# Patient Record
Sex: Female | Born: 1975 | Race: White | Hispanic: No | Marital: Married | Smoking: Former smoker
Health system: Southern US, Community
[De-identification: ages and names within clinical notes are randomized; demographics above are authoritative.]

## PROBLEM LIST (undated history)

## (undated) DIAGNOSIS — Z8619 Personal history of other infectious and parasitic diseases: Secondary | ICD-10-CM

## (undated) DIAGNOSIS — M549 Dorsalgia, unspecified: Secondary | ICD-10-CM

## (undated) DIAGNOSIS — M5126 Other intervertebral disc displacement, lumbar region: Secondary | ICD-10-CM

## (undated) DIAGNOSIS — R7303 Prediabetes: Secondary | ICD-10-CM

## (undated) DIAGNOSIS — F319 Bipolar disorder, unspecified: Secondary | ICD-10-CM

## (undated) DIAGNOSIS — M1990 Unspecified osteoarthritis, unspecified site: Secondary | ICD-10-CM

## (undated) DIAGNOSIS — G4730 Sleep apnea, unspecified: Secondary | ICD-10-CM

## (undated) DIAGNOSIS — A0472 Enterocolitis due to Clostridium difficile, not specified as recurrent: Secondary | ICD-10-CM

## (undated) DIAGNOSIS — F429 Obsessive-compulsive disorder, unspecified: Secondary | ICD-10-CM

## (undated) DIAGNOSIS — F419 Anxiety disorder, unspecified: Secondary | ICD-10-CM

## (undated) DIAGNOSIS — K219 Gastro-esophageal reflux disease without esophagitis: Secondary | ICD-10-CM

## (undated) DIAGNOSIS — I10 Essential (primary) hypertension: Secondary | ICD-10-CM

## (undated) DIAGNOSIS — M51369 Other intervertebral disc degeneration, lumbar region without mention of lumbar back pain or lower extremity pain: Secondary | ICD-10-CM

## (undated) DIAGNOSIS — E538 Deficiency of other specified B group vitamins: Secondary | ICD-10-CM

## (undated) DIAGNOSIS — K589 Irritable bowel syndrome without diarrhea: Secondary | ICD-10-CM

## (undated) DIAGNOSIS — F329 Major depressive disorder, single episode, unspecified: Secondary | ICD-10-CM

## (undated) DIAGNOSIS — K5900 Constipation, unspecified: Secondary | ICD-10-CM

## (undated) DIAGNOSIS — N200 Calculus of kidney: Secondary | ICD-10-CM

## (undated) DIAGNOSIS — F32A Depression, unspecified: Secondary | ICD-10-CM

## (undated) DIAGNOSIS — F909 Attention-deficit hyperactivity disorder, unspecified type: Secondary | ICD-10-CM

## (undated) DIAGNOSIS — R0602 Shortness of breath: Secondary | ICD-10-CM

## (undated) DIAGNOSIS — M5136 Other intervertebral disc degeneration, lumbar region: Secondary | ICD-10-CM

## (undated) DIAGNOSIS — M2550 Pain in unspecified joint: Secondary | ICD-10-CM

## (undated) DIAGNOSIS — F4310 Post-traumatic stress disorder, unspecified: Secondary | ICD-10-CM

## (undated) DIAGNOSIS — G43909 Migraine, unspecified, not intractable, without status migrainosus: Secondary | ICD-10-CM

## (undated) DIAGNOSIS — E669 Obesity, unspecified: Secondary | ICD-10-CM

## (undated) HISTORY — DX: Migraine, unspecified, not intractable, without status migrainosus: G43.909

## (undated) HISTORY — DX: Enterocolitis due to Clostridium difficile, not specified as recurrent: A04.72

## (undated) HISTORY — DX: Prediabetes: R73.03

## (undated) HISTORY — DX: Personal history of other infectious and parasitic diseases: Z86.19

## (undated) HISTORY — DX: Unspecified osteoarthritis, unspecified site: M19.90

## (undated) HISTORY — DX: Gastro-esophageal reflux disease without esophagitis: K21.9

## (undated) HISTORY — PX: WISDOM TOOTH EXTRACTION: SHX21

## (undated) HISTORY — DX: Essential (primary) hypertension: I10

## (undated) HISTORY — DX: Dorsalgia, unspecified: M54.9

## (undated) HISTORY — DX: Constipation, unspecified: K59.00

## (undated) HISTORY — DX: Bipolar disorder, unspecified: F31.9

## (undated) HISTORY — DX: Obsessive-compulsive disorder, unspecified: F42.9

## (undated) HISTORY — DX: Attention-deficit hyperactivity disorder, unspecified type: F90.9

## (undated) HISTORY — DX: Sleep apnea, unspecified: G47.30

## (undated) HISTORY — DX: Deficiency of other specified B group vitamins: E53.8

## (undated) HISTORY — DX: Major depressive disorder, single episode, unspecified: F32.9

## (undated) HISTORY — DX: Shortness of breath: R06.02

## (undated) HISTORY — DX: Pain in unspecified joint: M25.50

## (undated) HISTORY — PX: TONSILLECTOMY: SUR1361

## (undated) HISTORY — DX: Post-traumatic stress disorder, unspecified: F43.10

## (undated) HISTORY — DX: Obesity, unspecified: E66.9

## (undated) HISTORY — DX: Irritable bowel syndrome, unspecified: K58.9

## (undated) HISTORY — DX: Calculus of kidney: N20.0

---
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
## 2001-02-26 ENCOUNTER — Other Ambulatory Visit: Admission: RE | Admit: 2001-02-26 | Discharge: 2001-02-26 | Payer: Self-pay | Admitting: *Deleted

## 2010-10-25 ENCOUNTER — Emergency Department (HOSPITAL_COMMUNITY)
Admission: EM | Admit: 2010-10-25 | Discharge: 2010-10-25 | Payer: Self-pay | Source: Home / Self Care | Admitting: Family Medicine

## 2011-11-19 DIAGNOSIS — Z8619 Personal history of other infectious and parasitic diseases: Secondary | ICD-10-CM

## 2011-11-19 HISTORY — DX: Personal history of other infectious and parasitic diseases: Z86.19

## 2015-11-28 LAB — VITAMIN D 25 HYDROXY (VIT D DEFICIENCY, FRACTURES): Vit D, 25-Hydroxy: 29.6

## 2016-03-26 LAB — HEPATIC FUNCTION PANEL
ALT: 22 (ref 7–35)
AST: 22 (ref 13–35)
Alkaline Phosphatase: 59 (ref 25–125)
BILIRUBIN, TOTAL: 0.4

## 2016-03-26 LAB — LIPID PANEL
Cholesterol: 137 (ref 0–200)
HDL: 34 — AB (ref 35–70)
LDL CALC: 86
Triglycerides: 189 — AB (ref 40–160)

## 2016-03-26 LAB — CBC AND DIFFERENTIAL
HCT: 39 (ref 36–46)
Hemoglobin: 12.9 (ref 12.0–16.0)
PLATELETS: 300 (ref 150–399)
WBC: 9.4

## 2016-03-26 LAB — BASIC METABOLIC PANEL
BUN: 12 (ref 4–21)
Creatinine: 0.9 (ref 0.5–1.1)
GLUCOSE: 108
POTASSIUM: 3.9 (ref 3.4–5.3)
Sodium: 136 — AB (ref 137–147)

## 2016-04-23 LAB — CLOSTRIDIUM DIFFICILE TOXIN: C difficile Toxin Gene NAA: POSITIVE

## 2016-04-23 LAB — POCT ERYTHROCYTE SEDIMENTATION RATE, NON-AUTOMATED: SED RATE: 45

## 2016-04-24 DIAGNOSIS — A0472 Enterocolitis due to Clostridium difficile, not specified as recurrent: Secondary | ICD-10-CM

## 2016-04-24 HISTORY — DX: Enterocolitis due to Clostridium difficile, not specified as recurrent: A04.72

## 2017-05-10 ENCOUNTER — Encounter: Payer: Self-pay | Admitting: Emergency Medicine

## 2017-05-10 ENCOUNTER — Ambulatory Visit (HOSPITAL_BASED_OUTPATIENT_CLINIC_OR_DEPARTMENT_OTHER)
Admit: 2017-05-10 | Discharge: 2017-05-10 | Disposition: A | Discharge status: Home / Self Care | Payer: Self-pay | Attending: Emergency Medicine | Admitting: Emergency Medicine

## 2017-05-10 ENCOUNTER — Emergency Department
Admission: EM | Admit: 2017-05-10 | Discharge: 2017-05-10 | Disposition: A | Discharge status: Home / Self Care | Payer: Self-pay | Source: Home / Self Care

## 2017-05-10 DIAGNOSIS — M7989 Other specified soft tissue disorders: Secondary | ICD-10-CM

## 2017-05-10 DIAGNOSIS — I10 Essential (primary) hypertension: Secondary | ICD-10-CM

## 2017-05-10 HISTORY — DX: Other intervertebral disc degeneration, lumbar region without mention of lumbar back pain or lower extremity pain: M51.369

## 2017-05-10 HISTORY — DX: Anxiety disorder, unspecified: F41.9

## 2017-05-10 HISTORY — DX: Depression, unspecified: F32.A

## 2017-05-10 HISTORY — DX: Major depressive disorder, single episode, unspecified: F32.9

## 2017-05-10 HISTORY — DX: Other intervertebral disc displacement, lumbar region: M51.26

## 2017-05-10 HISTORY — DX: Other intervertebral disc degeneration, lumbar region: M51.36

## 2017-05-10 MED ORDER — HYDROCHLOROTHIAZIDE 12.5 MG PO TABS: 12.5000 mg | ORAL_TABLET | Freq: Every day | ORAL | 1 refills | Status: DC

## 2017-05-10 NOTE — Discharge Instructions (Signed)
Return if any problems.

## 2017-05-10 NOTE — ED Triage Notes (Signed)
Pt c/o that her BP is elevated this week, it normally runs 120/80.  Also c/o right calf pain that radiates up into her knee and back down to her foot.  No apparent injury.

## 2017-05-10 NOTE — ED Provider Notes (Signed)
CSN: [id number]     Arrival date & time 05/10/17  1433 History   None    Chief Complaint  Patient presents with  . Leg Pain  . Hypertension   (Consider location/radiation/quality/duration/timing/severity/associated sxs/prior Treatment) The history is provided by the patient. No language interpreter was used.  Leg Pain  Location:  Leg Time since incident:  3 days Leg location:  R leg Pain details:    Quality:  Aching   Radiates to:  Does not radiate   Severity:  Moderate   Onset quality:  Sudden   Timing:  Constant   Progression:  Worsening Chronicity:  New Dislocation: no   Foreign body present:  No foreign bodies Prior injury to area:  No Relieved by:  Nothing Worsened by:  Nothing Ineffective treatments:  None tried Risk factors: no concern for non-accidental trauma   Hypertension   Pt has 2 concerns.  Pt's blood pressure has been high on multiple doctors visits.  Pt is checking at home and is high.  Pt reports increased stress and weight gain.  No regular MD.  Pt also has swelling and pain in her right calf.  Pt reports goes p back of leg.  Pt had recent road trip and is on birth control  Past Medical History:  Diagnosis Date  . Anxiety   . Arthritis   . Bulging lumbar disc   . Depression    Past Surgical History:  Procedure Laterality Date  . CESAREAN SECTION    . TONSILLECTOMY     No family history on file. Social History  Substance Use Topics  . Smoking status: Never Smoker  . Smokeless tobacco: Never Used  . Alcohol use Yes     Comment: 4-5 drinks a week   OB History    No data available     Review of Systems  Musculoskeletal: Positive for myalgias.  All other systems reviewed and are negative.   Allergies  Meloxicam and Penicillins  Home Medications   Prior to Admission medications   Medication Sig Start Date End Date Taking? Authorizing Provider  ALPRAZolam Duanne Moron) 1 MG tablet Take 1 mg by mouth at bedtime as needed for anxiety.   Yes  [provider]  aspirin 81 MG chewable tablet Chew by mouth daily.   Yes [provider]  busPIRone (BUSPAR) 15 MG tablet Take 15 mg by mouth 3 (three) times daily.   Yes [provider]  FLUoxetine (PROZAC) 20 MG tablet Take 20 mg by mouth daily.   Yes [provider]  Norgestim-Eth Estrad Triphasic (TRI-SPRINTEC PO) Take by mouth.   Yes [provider]  hydrochlorothiazide (HYDRODIURIL) 12.5 MG tablet Take 1 tablet (12.5 mg total) by mouth daily. 05/10/17   Fransico Meadow, PA-C   Meds Ordered and Administered this Visit  Medications - No data to display  BP (!) 143/82 (BP Location: Left Arm)   Pulse 75   Temp 99.1 F (37.3 C) (Oral)   Ht 5\' 7"  (1.702 m)   Wt 253 lb 4 oz (114.9 kg)   LMP  (Within Weeks)   SpO2 99%   BMI 39.66 kg/m  No data found.   Physical Exam  Constitutional: She is oriented to person, place, and time. She appears well-developed and well-nourished.  HENT:  Head: Normocephalic.  Eyes: EOM are normal.  Neck: Normal range of motion.  Cardiovascular: Normal rate and regular rhythm.   Pulmonary/Chest: Effort normal and breath sounds normal.  Abdominal: She exhibits no  distension.  Musculoskeletal: Normal range of motion. She exhibits tenderness.  Calf right larger than left,  Slightly positive homans.  Neurological: She is alert and oriented to person, place, and time.  Psychiatric: She has a normal mood and affect.  Nursing note and vitals reviewed.   Urgent Care Course     Procedures (including critical care time)  Labs Review Labs Reviewed - No data to display  Imaging Review No results found.   Visual Acuity Review  Right Eye Distance:   Left Eye Distance:   Bilateral Distance:    Right Eye Near:   Left Eye Near:    Bilateral Near:      Pt to [place] for ultrasound of right leg,  If positive to ED, if negative home to followup with primary care.  referaal given   MDM   1.  Hypertension, unspecified type   2. Right leg swelling    Meds ordered this encounter  Medications  . FLUoxetine (PROZAC) 20 MG tablet    Sig: Take 20 mg by mouth daily.  . busPIRone (BUSPAR) 15 MG tablet    Sig: Take 15 mg by mouth 3 (three) times daily.  Marland Kitchen aspirin 81 MG chewable tablet    Sig: Chew by mouth daily.  Lenard Forth Triphasic (TRI-SPRINTEC PO)    Sig: Take by mouth.  . ALPRAZolam (XANAX) 1 MG tablet    Sig: Take 1 mg by mouth at bedtime as needed for anxiety.  . hydrochlorothiazide (HYDRODIURIL) 12.5 MG tablet    Sig: Take 1 tablet (12.5 mg total) by mouth daily.    Dispense:  30 tablet    Refill:  1    Order Specific Question:   Supervising Provider    AnswerBurnett Harry, DAVID [[id number]]       Fransico Meadow, PA-C 05/10/17 1641

## 2017-05-12 ENCOUNTER — Telehealth: Payer: Self-pay

## 2017-05-12 NOTE — Telephone Encounter (Signed)
Leg still swollen and sore urged to follow up with PCP or UC if symptoms persist.

## 2018-04-08 ENCOUNTER — Encounter: Payer: Self-pay | Admitting: Physician Assistant

## 2018-04-08 ENCOUNTER — Ambulatory Visit: Payer: Self-pay | Admitting: Physician Assistant

## 2018-04-08 VITALS — BP 112/80 | HR 86 | Temp 98.6°F | Ht 66.0 in | Wt 257.2 lb

## 2018-04-08 DIAGNOSIS — F32A Depression, unspecified: Secondary | ICD-10-CM

## 2018-04-08 DIAGNOSIS — G43909 Migraine, unspecified, not intractable, without status migrainosus: Secondary | ICD-10-CM | POA: Insufficient documentation

## 2018-04-08 DIAGNOSIS — F419 Anxiety disorder, unspecified: Secondary | ICD-10-CM

## 2018-04-08 DIAGNOSIS — F4310 Post-traumatic stress disorder, unspecified: Secondary | ICD-10-CM

## 2018-04-08 DIAGNOSIS — F339 Major depressive disorder, recurrent, unspecified: Secondary | ICD-10-CM | POA: Insufficient documentation

## 2018-04-08 DIAGNOSIS — M5136 Other intervertebral disc degeneration, lumbar region: Secondary | ICD-10-CM | POA: Insufficient documentation

## 2018-04-08 DIAGNOSIS — F329 Major depressive disorder, single episode, unspecified: Secondary | ICD-10-CM

## 2018-04-08 DIAGNOSIS — Z7689 Persons encountering health services in other specified circumstances: Secondary | ICD-10-CM

## 2018-04-08 DIAGNOSIS — M1990 Unspecified osteoarthritis, unspecified site: Secondary | ICD-10-CM | POA: Insufficient documentation

## 2018-04-08 DIAGNOSIS — E669 Obesity, unspecified: Secondary | ICD-10-CM

## 2018-04-08 DIAGNOSIS — I10 Essential (primary) hypertension: Secondary | ICD-10-CM | POA: Insufficient documentation

## 2018-04-08 DIAGNOSIS — R635 Abnormal weight gain: Secondary | ICD-10-CM

## 2018-04-08 DIAGNOSIS — R1011 Right upper quadrant pain: Secondary | ICD-10-CM | POA: Insufficient documentation

## 2018-04-08 DIAGNOSIS — K219 Gastro-esophageal reflux disease without esophagitis: Secondary | ICD-10-CM | POA: Insufficient documentation

## 2018-04-08 DIAGNOSIS — M5126 Other intervertebral disc displacement, lumbar region: Secondary | ICD-10-CM | POA: Insufficient documentation

## 2018-04-08 LAB — CBC
HEMATOCRIT: 37.1 % (ref 36.0–46.0)
HEMOGLOBIN: 12.4 g/dL (ref 12.0–15.0)
MCHC: 33.4 g/dL (ref 30.0–36.0)
MCV: 86.6 fl (ref 78.0–100.0)
Platelets: 382 10*3/uL (ref 150.0–400.0)
RBC: 4.28 Mil/uL (ref 3.87–5.11)
RDW: 14.7 % (ref 11.5–15.5)
WBC: 8.8 10*3/uL (ref 4.0–10.5)

## 2018-04-08 LAB — COMPREHENSIVE METABOLIC PANEL
ALBUMIN: 3.5 g/dL (ref 3.5–5.2)
ALT: 25 U/L (ref 0–35)
AST: 18 U/L (ref 0–37)
Alkaline Phosphatase: 63 U/L (ref 39–117)
BILIRUBIN TOTAL: 0.3 mg/dL (ref 0.2–1.2)
BUN: 13 mg/dL (ref 6–23)
CALCIUM: 9.1 mg/dL (ref 8.4–10.5)
CO2: 32 mEq/L (ref 19–32)
CREATININE: 0.96 mg/dL (ref 0.40–1.20)
Chloride: 98 mEq/L (ref 96–112)
GFR: 67.58 mL/min (ref >60.00)
Glucose, Bld: 93 mg/dL (ref 70–99)
Potassium: 4.1 mEq/L (ref 3.5–5.1)
Sodium: 137 mEq/L (ref 135–145)
TOTAL PROTEIN: 6.8 g/dL (ref 6.0–8.3)

## 2018-04-08 LAB — TSH: TSH: 2.53 u[IU]/mL (ref 0.35–4.50)

## 2018-04-08 LAB — HEMOGLOBIN A1C: Hgb A1c MFr Bld: 5.9 % (ref 4.6–6.5)

## 2018-04-08 NOTE — Patient Instructions (Addendum)
It was so great to meet you!  1. Exercise (or get your heart rate up intentionally) for 20-30 min daily (ex - vacuuming, treadmill,walking  Etc.) 2. No sodas for the next 3 weeks 3. Return to office to be re-weighed in 3 weeks  We will contact you with your lab results.  Consider looking into going to the health department for a physical with pap smear (likely reduced cost.)  Please talk to Dr. Creig Hines about the issues we discussed today at your next visit with him.  Return at your convenience to follow-up with [place].

## 2018-04-08 NOTE — Progress Notes (Signed)
Crystal Bean is a 42 y.o. female here to Establish Care.  I acted as a Education administrator for [employer], PA-C Anselmo Pickler, LPN  History of Present Illness:   Chief Complaint  Patient presents with  . Establish Care  . Weight Gain    Acute Concerns: Weight gain -- today she is 257 lb (282 lb was max weight but then got to 205 lb with working out at a gym); has gained about 50 lb since; she struggles with emotional eating.  She states that she has recently restarted drinking sodas, if she has soda at home she will drink it all day.  She has very little motivation to work out.  She does have a treadmill, and a reclining bike at home to use, but she does not use it.  She also enjoys walking but has not been walking recently.  She has not had routine blood work in quite some time, unsure of her thyroid or blood sugars have been elevated in the past.  She does not eat a lot of fast food.  She reports that her husband is a relatively healthy eater, and tries to make healthy choices for everyone in the family.  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  She states that these new issues have not been discussed with Crystal Bean, and she thinks that she needs new, or a change, in medication.  Has not been unable to see [facility] for about the past 6 months due to financial issues. Hypertension -- was diagnosed about a year ago, currently on HCTZ.  She has never been on any other medications for her blood pressure.  She takes her medication every day.  She denies any chest pain, shortness of breath, dizziness, lightheadedness, blurred visions, lower leg swelling.  Health Maintenance: Immunizations -- UTD Colonoscopy -- N/A Mammogram -- overdue PAP -- overdue Weight  -- Weight: 257 lb 4 oz (116.7 kg)   Depression screen PHQ 2/9 04/08/2018  Decreased Interest 3  Down, Depressed, Hopeless 2  PHQ - 2 Score 5  Altered sleeping 2  Tired, decreased energy 3  Change in appetite 3  Feeling bad or failure about yourself  3  Trouble concentrating 3  Moving slowly or fidgety/restless 2  Suicidal thoughts 0  PHQ-9 Score 21  Difficult doing work/chores Extremely dIfficult    GAD 7 : Generalized Anxiety Score 04/08/2018  Nervous, Anxious, on Edge 3  Control/stop worrying 3  Worry too much - different things 3  Trouble relaxing 3  Restless 1  Easily annoyed or irritable 3  Afraid - awful might happen 2  Total GAD 7 Score 18  Anxiety Difficulty Extremely difficult   Other providers/specialists: Crystal Bean -- psychiatrist Crystal Bean -- counselor  Past Medical History:  Diagnosis Date  . Anxiety   . Bulging lumbar disc   . Depression   . GERD (gastroesophageal reflux disease)   . History of chicken pox   . History of shingles 2013  . Hypertension   . Migraines   . Osteoarthritis   . PTSD (post-traumatic stress disorder)      Social History   Socioeconomic History  . Marital status: Married    Spouse name: Not on file  . Number of children: Not on file  .  Years of education: Not on file  . Highest education level: Not on file  Occupational History  . Not on file  Social Needs  . Financial resource strain: Not on file  . Food insecurity:    Worry: Not on file    Inability: Not on file  . Transportation needs:    Medical: Not on file    Non-medical: Not on file  Tobacco Use  . Smoking status: Never Smoker  . Smokeless tobacco: Never Used  Substance and Sexual Activity  . Alcohol use: Yes    Comment: 2-3 drinks a week  . Drug use: No  . Sexual activity: Yes    Birth control/protection: Pill  Lifestyle  . Physical activity:    Days per week: Not on file    Minutes per session: Not on file  . Stress: Not on file   Relationships  . Social connections:    Talks on phone: Not on file    Gets together: Not on file    Attends religious service: Not on file    Active member of club or organization: Not on file    Attends meetings of clubs or organizations: Not on file    Relationship status: Not on file  . Intimate partner violence:    Fear of current or ex partner: Not on file    Emotionally abused: Not on file    Physically abused: Not on file    Forced sexual activity: Not on file  Other Topics Concern  . 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 BLADDER SURGERY  1981   Urethera stretched  . caps on teeth  1980   Caps on all Teeth  . CESAREAN SECTION  2003  . TONSILLECTOMY      Family History  Problem Relation Age of Onset  . Hearing loss Father   . CVA Maternal Grandmother   . Arthritis Paternal Grandmother   . Hearing loss Paternal Grandmother     Allergies  Allergen Reactions  . Meloxicam   . Penicillins      Current Medications:   Current Outpatient Medications:  .  ALPRAZolam (XANAX) 1 MG tablet, Take 1 mg by mouth at bedtime as needed for anxiety., Disp: , Rfl:  .  amphetamine-dextroamphetamine (ADDERALL) 10 MG tablet, , Disp: , Rfl: 0 .  aspirin 81 MG chewable tablet, Chew by mouth daily., Disp: , Rfl:  .  FLUoxetine (PROZAC) 20 MG tablet, Take 20 mg by mouth daily., Disp: , Rfl:  .  hydrochlorothiazide (HYDRODIURIL) 12.5 MG tablet, Take 1 tablet (12.5 mg total) by mouth daily., Disp: 30 tablet, Rfl: 1 .  Norgestim-Eth Estrad Triphasic (TRI-SPRINTEC PO), Take by mouth., Disp: , Rfl:  .  nortriptyline (PAMELOR) 50 MG capsule, Take 50 mg by mouth daily. , Disp:  , Rfl: 3   Review of Systems:   ROS  Negative unless otherwise specified per HPI.  Vitals:   Vitals:   04/08/18 0847  BP: 112/80  Pulse: 86  Temp: 98.6 F (37 C)  TempSrc: Oral  SpO2: 95%  Weight: 257 lb 4 oz (116.7 kg)  Height: 5\' 6"  (1.676 m)     Body mass index is 41.52 kg/m.  Physical Exam:   Physical Exam  Constitutional: She appears well-developed. She is cooperative.  Non-toxic appearance. She does not have a sickly appearance. She does not appear ill. No distress.  Cardiovascular: Normal rate, regular rhythm, S1 normal, S2 normal, normal heart sounds and normal pulses.  No LE edema  Pulmonary/Chest: Effort normal and breath sounds normal.  Neurological: She is alert. GCS eye subscore is 4. GCS verbal subscore is 5. GCS motor subscore is 6.  Skin: Skin is warm, dry and intact.  Psychiatric: She has a normal mood and affect. Her speech is normal and behavior is normal.  Nursing note and vitals reviewed.   Assessment and Plan:    Gwenn was seen today for establish care and weight gain.  Diagnoses and all orders for this visit:  Encounter to establish care Discussed having her return for a physical at her convenience, as she is self-pay.  Weight gain and obesity, unspecified classification, unspecified obesity type, unspecified whether serious comorbidity present Multifactorial.  I do suspect that this is related to her anxiety and depression.  I would like to check a hemoglobin A1c and thyroid as well as a CBC and CMP.  We did make a few calls today, in terms of increasing physical activity to 20 to 30 minutes times a day and cessation of sodas.  She is going to make a nurse visit to follow-up in our office for weight check in about 3 weeks.  Patient verbalized understanding.  She stated that if her hemoglobin A1c was elevated she would be agreeable to starting metformin. -     Hemoglobin A1c -     CBC -     Comprehensive metabolic panel -     TSH  Essential  hypertension Currently well controlled.  Anxiety, depression, PTSD Management per Crystal Bean.  I highly recommended that she try to get back in with [facility] to discuss her mental health.  She does not have any SI/HI today.   . Reviewed expectations re: course of current medical issues. . Discussed self-management of symptoms. . Outlined signs and symptoms indicating need for more acute intervention. . Patient verbalized understanding and all questions were answered. . See orders for this visit as documented in the electronic medical record. . Patient received an After-Visit Summary.   CMA or LPN served as scribe during this visit. History, Physical, and Plan performed by medical provider. Documentation and orders reviewed and attested to.   Inda Coke, PA-C

## 2018-04-10 ENCOUNTER — Other Ambulatory Visit: Payer: Self-pay | Admitting: Physician Assistant

## 2018-04-10 MED ORDER — METFORMIN HCL 500 MG PO TABS
500.0000 mg | ORAL_TABLET | Freq: Every day | ORAL | 0 refills | Status: DC
Start: 2018-04-10 — End: 2018-07-14

## 2018-04-14 ENCOUNTER — Other Ambulatory Visit: Payer: Self-pay | Admitting: *Deleted

## 2018-04-14 MED ORDER — HYDROCHLOROTHIAZIDE 12.5 MG PO TABS: 12.5000 mg | ORAL_TABLET | Freq: Every day | ORAL | 1 refills | Status: DC

## 2018-04-14 MED ORDER — NORGESTIM-ETH ESTRAD TRIPHASIC 0.18/0.215/0.25 MG-35 MCG PO TABS: 1.0000 | ORAL_TABLET | Freq: Every day | ORAL | 0 refills | Status: DC

## 2018-04-14 NOTE — Telephone Encounter (Signed)
Pt notified Rx's were sent to pharmacy.

## 2018-04-14 NOTE — Telephone Encounter (Signed)
Pt requested refills on Birth control and HCTZ. Discussed with Aldona Bar okay to refill.

## 2018-04-29 ENCOUNTER — Ambulatory Visit (INDEPENDENT_AMBULATORY_CARE_PROVIDER_SITE_OTHER): Payer: Self-pay

## 2018-04-29 VITALS — Wt 245.0 lb

## 2018-04-29 DIAGNOSIS — R635 Abnormal weight gain: Secondary | ICD-10-CM

## 2018-04-29 DIAGNOSIS — E669 Obesity, unspecified: Secondary | ICD-10-CM

## 2018-04-29 NOTE — Progress Notes (Signed)
Patient in office today for nurse visit weight check. Her current weight is 245. She will make another appointment for weight check in 3 to 4 weeks. She will continue all the hard work.

## 2018-05-22 ENCOUNTER — Ambulatory Visit (INDEPENDENT_AMBULATORY_CARE_PROVIDER_SITE_OTHER): Payer: Self-pay

## 2018-05-22 ENCOUNTER — Ambulatory Visit (INDEPENDENT_AMBULATORY_CARE_PROVIDER_SITE_OTHER): Payer: Self-pay | Admitting: Physician Assistant

## 2018-05-22 ENCOUNTER — Encounter: Payer: Self-pay | Admitting: Physician Assistant

## 2018-05-22 VITALS — BP 102/70 | HR 71 | Temp 98.7°F | Ht 66.0 in | Wt 241.5 lb

## 2018-05-22 DIAGNOSIS — R1084 Generalized abdominal pain: Secondary | ICD-10-CM

## 2018-05-22 LAB — CBC
HEMATOCRIT: 39.9 % (ref 36.0–46.0)
HEMOGLOBIN: 13.3 g/dL (ref 12.0–15.0)
MCHC: 33.3 g/dL (ref 30.0–36.0)
MCV: 86.5 fl (ref 78.0–100.0)
Platelets: 384 10*3/uL (ref 150.0–400.0)
RBC: 4.61 Mil/uL (ref 3.87–5.11)
RDW: 15.5 % (ref 11.5–15.5)
WBC: 7.8 10*3/uL (ref 4.0–10.5)

## 2018-05-22 LAB — COMPREHENSIVE METABOLIC PANEL
ALBUMIN: 4.1 g/dL (ref 3.5–5.2)
ALK PHOS: 58 U/L (ref 39–117)
ALT: 44 U/L — ABNORMAL HIGH (ref 0–35)
AST: 33 U/L (ref 0–37)
BUN: 10 mg/dL (ref 6–23)
CALCIUM: 9.4 mg/dL (ref 8.4–10.5)
CHLORIDE: 95 meq/L — AB (ref 96–112)
CO2: 29 mEq/L (ref 19–32)
Creatinine, Ser: 1.01 mg/dL (ref 0.40–1.20)
GFR: 63.7 mL/min (ref >60.00)
Glucose, Bld: 108 mg/dL — ABNORMAL HIGH (ref 70–99)
POTASSIUM: 3.5 meq/L (ref 3.5–5.1)
Sodium: 136 mEq/L (ref 135–145)
TOTAL PROTEIN: 7.7 g/dL (ref 6.0–8.3)
Total Bilirubin: 0.4 mg/dL (ref 0.2–1.2)

## 2018-05-22 LAB — POCT URINALYSIS DIPSTICK
Bilirubin, UA: NEGATIVE
Glucose, UA: NEGATIVE
Ketones, UA: NEGATIVE
LEUKOCYTES UA: NEGATIVE
NITRITE UA: NEGATIVE
PH UA: 6 (ref 5.0–8.0)
PROTEIN UA: NEGATIVE
SPEC GRAV UA: 1.015 (ref 1.010–1.025)
Urobilinogen, UA: 0.2 E.U./dL

## 2018-05-22 LAB — POCT URINE PREGNANCY: Preg Test, Ur: NEGATIVE

## 2018-05-22 LAB — LIPASE: LIPASE: 30 U/L (ref 11.0–59.0)

## 2018-05-22 NOTE — Progress Notes (Signed)
Crystal Bean is a 42 y.o. female here for a new problem.   History of Present Illness:   Chief Complaint  Patient presents with  . Abdominal Pain    Abdominal Pain  This is a new problem. Episode onset: Started 3 weeeks ago, pt says she is unable to eat only eating 500- 1000 calories a day. The onset quality is gradual. The problem occurs constantly. The problem has been gradually worsening. The pain is located in the generalized abdominal region. Pain scale: right now no pain as the day goes on she does get pain. The quality of the pain is cramping and aching. The abdominal pain radiates to the epigastric region (Rib area). Associated symptoms include belching, constipation, diarrhea, nausea and vomiting. Pertinent negatives include no fever or headaches. The pain is relieved by being still. She has tried antacids for the symptoms. The treatment provided mild relief. There is no history of colon cancer, Crohn's disease, gallstones, GERD, irritable bowel syndrome, pancreatitis or ulcerative colitis.    Wt Readings from Last 3 Encounters:  05/22/18 241 lb 8 oz (109.5 kg)  04/29/18 245 lb (111.1 kg)  04/08/18 257 lb 4 oz (116.7 kg)   Taking Metformin as written -- she states that when she first started medication she did not have any issues with GI tolerance. Has intermittent constipation and diarrhea. Denies any changes in urination -- having stress incontinence which is relatively new for her.  No fevers. Takes her birth control religiously -- Patient's last menstrual period was 04/29/2018.  Husband present during interview and exam.   Past Medical History:  Diagnosis Date  . Anxiety   . Bulging lumbar disc   . Depression   . GERD (gastroesophageal reflux disease)   . History of chicken pox   . History of shingles 2013  . Hypertension   . Migraines   . Osteoarthritis   . PTSD (post-traumatic stress disorder)      Social History   Socioeconomic History  . Marital status:  Married    Spouse name: Not on file  . Number of children: Not on file  . Years of education: Not on file  . Highest education level: Not on file  Occupational History  . Not on file  Social Needs  . Financial resource strain: Not on file  . Food insecurity:    Worry: Not on file    Inability: Not on file  . Transportation needs:    Medical: Not on file    Non-medical: Not on file  Tobacco Use  . Smoking status: Never Smoker  . Smokeless tobacco: Never Used  Substance and Sexual Activity  . Alcohol use: Yes    Comment: 2-3 drinks a week  . Drug use: No  . Sexual activity: Yes    Birth control/protection: Pill  Lifestyle  . Physical activity:    Days per week: Not on file    Minutes per session: Not on file  . Stress: Not on file  Relationships  . Social connections:    Talks on phone: Not on file    Gets together: Not on file    Attends religious service: Not on file    Active member of club or organization: Not on file    Attends meetings of clubs or organizations: Not on file    Relationship status: Not on file  . Intimate partner violence:    Fear of current or ex partner: Not on file    Emotionally abused: Not  on file    Physically abused: Not on file    Forced sexual activity: Not on file  Other Topics Concern  . 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 BLADDER SURGERY  1981   Urethera stretched  . caps on teeth  1980   Caps on all Teeth  . CESAREAN SECTION  2003  . TONSILLECTOMY      Family History  Problem Relation Age of Onset  . Hearing loss Father   . CVA Maternal Grandmother   . Arthritis Paternal Grandmother   . Hearing loss Paternal  Grandmother     Allergies  Allergen Reactions  . Meloxicam   . Penicillins     Current Medications:   Current Outpatient Medications:  .  ALPRAZolam (XANAX) 1 MG tablet, Take 1 mg by mouth 3 (three) times daily as needed for anxiety. , Disp: , Rfl:  .  amphetamine-dextroamphetamine (ADDERALL) 10 MG tablet, as needed. , Disp: , Rfl: 0 .  aspirin 81 MG chewable tablet, Chew by mouth daily., Disp: , Rfl:  .  desvenlafaxine (PRISTIQ) 50 MG 24 hr tablet, TAKE 1 TABLET BY MOUTH IN THE MORNING AND TAKE 2 TABLETS AT BEDTIME, Disp: , Rfl: 2 .  hydrochlorothiazide (HYDRODIURIL) 12.5 MG tablet, Take 1 tablet (12.5 mg total) by mouth daily., Disp: 30 tablet, Rfl: 1 .  metFORMIN (GLUCOPHAGE) 500 MG tablet, Take 1 tablet (500 mg total) by mouth daily with breakfast., Disp: 90 tablet, Rfl: 0 .  Norgestimate-Ethinyl Estradiol Triphasic (TRI-SPRINTEC) 0.18/0.215/0.25 MG-35 MCG tablet, Take 1 tablet by mouth daily., Disp: 3 Package, Rfl: 0   Review of Systems:   Review of Systems  Constitutional: Negative for fever.  Gastrointestinal: Positive for abdominal pain, constipation, diarrhea, nausea and vomiting.  Neurological: Negative for headaches.    Vitals:   Vitals:   05/22/18 0821  BP: 102/70  Pulse: 71  Temp: 98.7 F (37.1 C)  TempSrc: Oral  SpO2: 96%  Weight: 241 lb 8 oz (109.5 kg)  Height: 5\' 6"  (1.676 m)     Body mass index is 38.98 kg/m.  Physical Exam:   Physical Exam  Constitutional: She appears well-developed. She is cooperative.  Non-toxic appearance. She does not have a sickly appearance. She does not appear ill. No distress.  Cardiovascular: Normal rate, regular rhythm, S1 normal, S2 normal, normal heart sounds and normal pulses.  No LE edema  Pulmonary/Chest: Effort normal and breath sounds normal.  Abdominal: Normal appearance and bowel sounds are normal. There is generalized tenderness. There is no rigidity, no rebound, no guarding, no CVA tenderness, no tenderness at  McBurney's point and negative Murphy's sign.  Neurological: She is alert. GCS eye subscore is 4. GCS verbal subscore is 5. GCS motor subscore is 6.  Skin: Skin is warm, dry and intact.  Psychiatric: She has a normal mood and affect. Her speech is normal and behavior is normal.  Nursing note and vitals reviewed.   Results for orders placed or performed in visit on 05/22/18  POCT urine pregnancy  Result Value Ref Range   Preg Test, Ur Negative Negative  POCT urinalysis dipstick  Result Value Ref Range   Color, UA Yellow    Clarity, UA Clear    Glucose, UA Negative Negative   Bilirubin, UA Negative    Ketones, UA Negative    Spec Grav, UA 1.015 1.010 - 1.025   Blood, UA Moderate    pH, UA 6.0 5.0 - 8.0   Protein, UA Negative Negative   Urobilinogen, UA 0.2 0.2 or 1.0 E.U./dL   Nitrite, UA Negative    Leukocytes, UA Negative Negative   Appearance     Odor      Abd xray -- shows stool burden on R side  Assessment and Plan:    Kaslyn was seen today for abdominal pain.  Diagnoses and all orders for this visit:  Generalized abdominal pain Abdominal exam benign, vitals normal. Abdominal xray with stool burden. Discussed Miralax regimen to help with symptoms. Will check labs as well. If symptoms worsen or do not improve, will obtain abd u/s. Patient and husband is agreeable to plan. Provided list of red flags. -     POCT urine pregnancy -     Comprehensive metabolic panel -     CBC -     DG Abd 2 Views; Future -     POCT urinalysis dipstick -     Lipase   . Reviewed expectations re: course of current medical issues. . Discussed self-management of symptoms. . Outlined signs and symptoms indicating need for more acute intervention. . Patient verbalized understanding and all questions were answered. . See orders for this visit as documented in the electronic medical record. . Patient received an After-Visit Summary.  CMA or LPN served as scribe during this visit. History,  Physical, and Plan performed by medical provider. Documentation and orders reviewed and attested to.   Inda Coke, PA-C

## 2018-05-22 NOTE — Patient Instructions (Addendum)
Hold Metformin for at least a week while we work on having regular bowel movements.  Start 1 capful of Miralax daily, may increase by half capfuls daily until you reach your goal of having 1 soft, formed bowel movement daily.  Keep [place] posted on symptoms. We can order abdominal ultrasound for further evaluation if needed.   Abdominal Pain, Adult Many things can cause belly (abdominal) pain. Most times, belly pain is not dangerous. Many cases of belly pain can be watched and treated at home. Sometimes belly pain is serious, though. Your doctor will try to find the cause of your belly pain. Follow these instructions at home:  Take over-the-counter and prescription medicines only as told by your doctor. Do not take medicines that help you poop (laxatives) unless told to by your doctor.  Drink enough fluid to keep your pee (urine) clear or pale yellow.  Watch your belly pain for any changes.  Keep all follow-up visits as told by your doctor. This is important. Contact a doctor if:  Your belly pain changes or gets worse.  You are not hungry, or you lose weight without trying.  You are having trouble pooping (constipated) or have watery poop (diarrhea) for more than 2-3 days.  You have pain when you pee or poop.  Your belly pain wakes you up at night.  Your pain gets worse with meals, after eating, or with certain foods.  You are throwing up and cannot keep anything down.  You have a fever. Get help right away if:  Your pain does not go away as soon as your doctor says it should.  You cannot stop throwing up.  Your pain is only in areas of your belly, such as the right side or the left lower part of the belly.  You have bloody or black poop, or poop that looks like tar.  You have very bad pain, cramping, or bloating in your belly.  You have signs of not having enough fluid or water in your body (dehydration), such as: ? Dark pee, very little pee, or no pee. ? Cracked  lips. ? Dry mouth. ? Sunken eyes. ? Sleepiness. ? Weakness. This information is not intended to replace advice given to you by your health care provider. Make sure you discuss any questions you have with your health care provider. Document Released: 04/22/2008 Document Revised: 05/24/2016 Document Reviewed: 04/17/2016 Elsevier Interactive Patient Education  2018 [employer].

## 2018-05-26 ENCOUNTER — Ambulatory Visit: Payer: Self-pay

## 2018-05-29 ENCOUNTER — Encounter: Payer: Self-pay | Admitting: Physician Assistant

## 2018-07-06 ENCOUNTER — Other Ambulatory Visit: Payer: Self-pay | Admitting: Physician Assistant

## 2018-07-14 ENCOUNTER — Encounter: Payer: Self-pay | Admitting: Physician Assistant

## 2018-07-14 ENCOUNTER — Ambulatory Visit (HOSPITAL_COMMUNITY)
Admission: RE | Admit: 2018-07-14 | Discharge: 2018-07-14 | Disposition: A | Discharge status: Home / Self Care | Payer: Self-pay | Attending: Psychiatry | Admitting: Psychiatry

## 2018-07-14 ENCOUNTER — Ambulatory Visit: Payer: Self-pay | Admitting: Physician Assistant

## 2018-07-14 VITALS — BP 120/76 | HR 72 | Temp 98.6°F | Ht 66.0 in | Wt 249.8 lb

## 2018-07-14 DIAGNOSIS — K219 Gastro-esophageal reflux disease without esophagitis: Secondary | ICD-10-CM | POA: Insufficient documentation

## 2018-07-14 DIAGNOSIS — Z822 Family history of deafness and hearing loss: Secondary | ICD-10-CM | POA: Insufficient documentation

## 2018-07-14 DIAGNOSIS — F4310 Post-traumatic stress disorder, unspecified: Secondary | ICD-10-CM

## 2018-07-14 DIAGNOSIS — M1990 Unspecified osteoarthritis, unspecified site: Secondary | ICD-10-CM | POA: Insufficient documentation

## 2018-07-14 DIAGNOSIS — I10 Essential (primary) hypertension: Secondary | ICD-10-CM | POA: Insufficient documentation

## 2018-07-14 DIAGNOSIS — F322 Major depressive disorder, single episode, severe without psychotic features: Secondary | ICD-10-CM | POA: Insufficient documentation

## 2018-07-14 DIAGNOSIS — F419 Anxiety disorder, unspecified: Secondary | ICD-10-CM | POA: Insufficient documentation

## 2018-07-14 DIAGNOSIS — Z880 Allergy status to penicillin: Secondary | ICD-10-CM | POA: Insufficient documentation

## 2018-07-14 DIAGNOSIS — Z886 Allergy status to analgesic agent status: Secondary | ICD-10-CM | POA: Insufficient documentation

## 2018-07-14 DIAGNOSIS — F332 Major depressive disorder, recurrent severe without psychotic features: Secondary | ICD-10-CM
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
## 2018-07-14 NOTE — Patient Instructions (Addendum)
Please go to [facility].  [address] [place], [place] [address]

## 2018-07-14 NOTE — H&P (Signed)
[place] Screening Exam  Crystal Bean is an 42 y.o. female patient presents to [facility] as walk in; brought in by her husband after patient's MD visit today and filling out depression questionnaire; and provider instructing her to go to hospital for evaluation.  Patient states that she is depressed; that she has a psychiatrist.  Patient denies suicidal/self-harm/homicidal ideation, psychosis, and paranoia.  Husband at side and states that he also feels safe with patient being home and doesn't feel that patient is suicidal.  Total Time spent with patient: 45 minutes  Psychiatric Specialty Exam: Physical Exam  Constitutional: She is oriented to person, place, and time. She appears well-nourished.  Neck: Normal range of motion. Neck supple.  Respiratory: Effort normal.  Musculoskeletal: Normal range of motion.  Neurological: She is alert and oriented to person, place, and time.  Skin: Skin is warm and dry.  Psychiatric: Her speech is normal and behavior is normal. Judgment and thought content normal. Her mood appears anxious (Stable). Cognition and memory are normal. She exhibits a depressed mood (Stable).    Review of Systems  Psychiatric/Behavioral: Negative for substance abuse. Depression: Stable. Hallucinations: Denies. Suicidal ideas: Denies. Nervous/anxious: Stable.   All other systems reviewed and are negative.   Blood pressure 133/86, pulse 70, temperature 98.4 F (36.9 C), resp. rate 16, last menstrual period 06/30/2018, SpO2 100 %.There is no height or weight on file to calculate BMI.  General Appearance: Casual and Neat  Eye Contact:  Good  Speech:  Clear and Coherent and Normal Rate  Volume:  Normal  Mood:  Appropriate  Affect:  Appropriate  Thought Process:  Coherent and Goal Directed  Orientation:  Full (Time, Place, and Person)  Thought Content:  Logical  Suicidal Thoughts:  No  Homicidal Thoughts:  No  Memory:  Immediate;   Good Recent;   Good Remote;    Good  Judgement:  Intact  Insight:  Present  Psychomotor Activity:  Normal  Concentration: Concentration: Good and Attention Span: Good  Recall:  Good  Fund of Knowledge:Good  Language: Good  Akathisia:  No  Handed:  Right  AIMS (if indicated):     Assets:  Communication Skills Desire for Improvement Financial Resources/Insurance Housing Physical Health Social Support Transportation  Sleep:       Musculoskeletal: Strength & Muscle Tone: within normal limits Gait & Station: normal Patient leans: N/A  Blood pressure 133/86, pulse 70, temperature 98.4 F (36.9 C), resp. rate 16, last menstrual period 06/30/2018, SpO2 100 %.  Recommendations:  Follow up with current outpatient psychiatric provider (Dr. Creig Hines); give resources for community psychiatric services  Disposition:  Patient psychiatrically cleared No evidence of imminent risk to self or others at present.   Patient does not meet criteria for psychiatric inpatient admission. Supportive therapy provided about ongoing stressors. Discussed crisis plan, support from social network, calling 911, coming to the Emergency Department, and calling Suicide Hotline.  Based on my evaluation the patient does not appear to have an emergency medical condition.   Shuvon Rankin, NP 07/14/2018, 11:35 AM

## 2018-07-19 ENCOUNTER — Other Ambulatory Visit: Payer: Self-pay | Admitting: Physician Assistant

## 2018-07-19 ENCOUNTER — Encounter: Payer: Self-pay | Admitting: Physician Assistant

## 2018-08-26 ENCOUNTER — Ambulatory Visit (INDEPENDENT_AMBULATORY_CARE_PROVIDER_SITE_OTHER): Payer: Self-pay | Admitting: Physician Assistant

## 2018-08-26 ENCOUNTER — Ambulatory Visit: Payer: Self-pay

## 2018-08-26 ENCOUNTER — Encounter: Payer: Self-pay | Admitting: Physician Assistant

## 2018-08-26 VITALS — BP 146/100 | HR 90 | Temp 98.5°F | Ht 66.0 in | Wt 252.2 lb

## 2018-08-26 DIAGNOSIS — R413 Other amnesia: Secondary | ICD-10-CM

## 2018-08-26 DIAGNOSIS — I10 Essential (primary) hypertension: Secondary | ICD-10-CM

## 2018-08-26 DIAGNOSIS — F319 Bipolar disorder, unspecified: Secondary | ICD-10-CM

## 2018-08-26 LAB — COMPREHENSIVE METABOLIC PANEL
ALT: 34 U/L (ref 0–35)
AST: 33 U/L (ref 0–37)
Albumin: 3.9 g/dL (ref 3.5–5.2)
Alkaline Phosphatase: 63 U/L (ref 39–117)
BILIRUBIN TOTAL: 0.3 mg/dL (ref 0.2–1.2)
BUN: 9 mg/dL (ref 6–23)
CHLORIDE: 96 meq/L (ref 96–112)
CO2: 32 mEq/L (ref 19–32)
Calcium: 9.5 mg/dL (ref 8.4–10.5)
Creatinine, Ser: 0.95 mg/dL (ref 0.40–1.20)
GFR: 68.28 mL/min (ref >60.00)
GLUCOSE: 109 mg/dL — AB (ref 70–99)
Potassium: 3.4 mEq/L — ABNORMAL LOW (ref 3.5–5.1)
Sodium: 137 mEq/L (ref 135–145)
Total Protein: 7.8 g/dL (ref 6.0–8.3)

## 2018-08-26 LAB — CBC
HCT: 39.6 % (ref 36.0–46.0)
Hemoglobin: 12.9 g/dL (ref 12.0–15.0)
MCHC: 32.7 g/dL (ref 30.0–36.0)
MCV: 85.1 fl (ref 78.0–100.0)
Platelets: 411 10*3/uL — ABNORMAL HIGH (ref 150.0–400.0)
RBC: 4.65 Mil/uL (ref 3.87–5.11)
RDW: 15.5 % (ref 11.5–15.5)
WBC: 9.6 10*3/uL (ref 4.0–10.5)

## 2018-08-26 LAB — VITAMIN B12: VITAMIN B 12: 276 pg/mL (ref 211–911)

## 2018-08-26 LAB — TSH: TSH: 2.89 u[IU]/mL (ref 0.35–4.50)

## 2018-08-26 MED ORDER — LOSARTAN POTASSIUM-HCTZ 50-12.5 MG PO TABS: 1.0000 | ORAL_TABLET | Freq: Every day | ORAL | 1 refills | Status: DC

## 2018-08-26 NOTE — Patient Instructions (Addendum)
It was great to see you!  Stop HCTZ. Start Hyzaar -- Hydrochlorothiazide and Losartan combo pill  Follow-up in 2 weeks, sooner if you develop changes in symptoms.  Please call [facility] and tell them that I have put in an order for the MRI without contrast. Tell them that you are self pay and do not require a prior authorization. They should help you schedule this.
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
## 2018-08-27 ENCOUNTER — Ambulatory Visit
Admission: RE | Admit: 2018-08-27 | Discharge: 2018-08-27 | Disposition: A | Discharge status: Home / Self Care | Payer: Self-pay | Source: Ambulatory Visit | Attending: Physician Assistant | Admitting: Physician Assistant

## 2018-08-27 DIAGNOSIS — R413 Other amnesia: Secondary | ICD-10-CM

## 2018-08-31 ENCOUNTER — Other Ambulatory Visit: Payer: Self-pay | Admitting: Physician Assistant

## 2018-08-31 DIAGNOSIS — R413 Other amnesia: Secondary | ICD-10-CM

## 2018-08-31 MED ORDER — CYANOCOBALAMIN 1000 MCG/ML IJ SOLN: 1000.0000 ug | INTRAMUSCULAR | 3 refills | Status: DC

## 2018-09-01 ENCOUNTER — Telehealth: Payer: Self-pay

## 2018-09-01 NOTE — Telephone Encounter (Signed)
Called and spoke with patient.  I explained that Butch Penny is out today, pt prefers to wait to speak w/Donna when she returns in reference to b12 injections.  Pt will call back again tomorrow.

## 2018-09-01 NOTE — Telephone Encounter (Signed)
Copied from [place] [id number]. Topic: General - Other >> Sep 01, 2018 12:46 PM Conception Chancy, NT wrote: Reason for CRM: patient is requesting a call back from [facility].

## 2018-09-08 ENCOUNTER — Ambulatory Visit: Payer: Self-pay | Admitting: Psychiatry

## 2018-09-08 ENCOUNTER — Encounter: Payer: Self-pay | Admitting: Psychiatry

## 2018-09-08 VITALS — BP 132/84 | HR 76 | Ht 67.0 in | Wt 260.0 lb

## 2018-09-08 DIAGNOSIS — F429 Obsessive-compulsive disorder, unspecified: Secondary | ICD-10-CM | POA: Insufficient documentation

## 2018-09-08 DIAGNOSIS — F422 Mixed obsessional thoughts and acts: Secondary | ICD-10-CM

## 2018-09-08 DIAGNOSIS — F902 Attention-deficit hyperactivity disorder, combined type: Secondary | ICD-10-CM

## 2018-09-08 DIAGNOSIS — F339 Major depressive disorder, recurrent, unspecified: Secondary | ICD-10-CM

## 2018-09-08 DIAGNOSIS — F4310 Post-traumatic stress disorder, unspecified: Secondary | ICD-10-CM

## 2018-09-08 MED ORDER — AMPHETAMINE-DEXTROAMPHETAMINE 10 MG PO TABS: 10.0000 mg | ORAL_TABLET | Freq: Two times a day (BID) | ORAL | 0 refills | Status: DC
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
## 2018-09-14 ENCOUNTER — Encounter: Payer: Self-pay | Admitting: Physician Assistant

## 2018-09-17 ENCOUNTER — Ambulatory Visit: Payer: Self-pay | Admitting: Physician Assistant

## 2018-09-17 ENCOUNTER — Encounter: Payer: Self-pay | Admitting: Physician Assistant

## 2018-09-17 VITALS — BP 118/78 | HR 88 | Temp 98.7°F | Resp 16 | Ht 66.0 in | Wt 256.8 lb

## 2018-09-17 DIAGNOSIS — I10 Essential (primary) hypertension: Secondary | ICD-10-CM

## 2018-09-17 DIAGNOSIS — M25512 Pain in left shoulder: Secondary | ICD-10-CM

## 2018-09-17 LAB — BASIC METABOLIC PANEL
BUN: 11 mg/dL (ref 6–23)
CHLORIDE: 100 meq/L (ref 96–112)
CO2: 29 meq/L (ref 19–32)
CREATININE: 0.93 mg/dL (ref 0.40–1.20)
Calcium: 9.4 mg/dL (ref 8.4–10.5)
GFR: 69.96 mL/min (ref >60.00)
GLUCOSE: 96 mg/dL (ref 70–99)
Potassium: 3.9 mEq/L (ref 3.5–5.1)
Sodium: 138 mEq/L (ref 135–145)

## 2018-09-17 MED ORDER — LOSARTAN POTASSIUM-HCTZ 50-12.5 MG PO TABS: 1.0000 | ORAL_TABLET | Freq: Every day | ORAL | 1 refills | Status: DC
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
## 2018-09-17 NOTE — Patient Instructions (Signed)
It was great to see you!  Continue blood pressure medication.  Try the neck exercises.  Let's follow-up in 6 months, sooner if you have concerns.  Take care,  Inda Coke PA-C

## 2018-10-28 ENCOUNTER — Other Ambulatory Visit: Payer: Self-pay | Admitting: *Deleted

## 2018-10-28 MED ORDER — HYDROCHLOROTHIAZIDE 12.5 MG PO CAPS: 12.5000 mg | ORAL_CAPSULE | Freq: Every day | ORAL | 0 refills | Status: DC

## 2018-10-28 MED ORDER — LOSARTAN POTASSIUM 50 MG PO TABS: 50.0000 mg | ORAL_TABLET | Freq: Every day | ORAL | 0 refills | Status: DC

## 2018-10-29 ENCOUNTER — Encounter: Payer: Self-pay | Admitting: *Deleted

## 2018-10-29 ENCOUNTER — Telehealth: Payer: Self-pay | Admitting: *Deleted

## 2018-10-29 NOTE — Telephone Encounter (Signed)
Called patient to advise her this office will delay opening tomorrow until 9 am due to ewear prediction. This RN asked if she could come in for a 9:30 appointment. She stated she can; advised she arrive early to check in. She verbalized understanding.

## 2018-10-30 ENCOUNTER — Ambulatory Visit: Payer: Self-pay | Admitting: Diagnostic Neuroimaging

## 2018-10-30 ENCOUNTER — Encounter: Payer: Self-pay | Admitting: Diagnostic Neuroimaging

## 2018-10-30 VITALS — BP 134/76 | HR 78 | Ht 66.0 in | Wt 274.0 lb

## 2018-10-30 DIAGNOSIS — R200 Anesthesia of skin: Secondary | ICD-10-CM

## 2018-10-30 DIAGNOSIS — R413 Other amnesia: Secondary | ICD-10-CM

## 2018-10-30 DIAGNOSIS — H538 Other visual disturbances: Secondary | ICD-10-CM

## 2018-10-30 NOTE — Progress Notes (Signed)
[facility]  PATIENT: Crystal Bean DOB: 1976-02-03  REFERRING CLINICIAN: Gwynneth Munson HISTORY FROM: patient  REASON FOR VISIT: new consult    HISTORICAL  CHIEF COMPLAINT:  Chief Complaint  Patient presents with  . New Patient (Initial Visit)    Rm 7, David-husband  . Referred by Inda Coke PA,    Memory loss    HISTORY OF PRESENT ILLNESS:   42 year old female with history of ADHD, bipolar disorder, PTSD here for evaluation of memory loss.  At age 80 years old patient was having increasing difficulty at school, hyperactivity, was diagnosed with ADHD and anxiety and depression.  She was prescribed Prozac and Ritalin.  Patient was able to complete high school.  She went to college and failed her first year.  She had to repeat her classes and graduated with a bachelor's in marketing in 5 years.  She worked for several years afterwards.  In 2006 she got married.  She continued to have issues with focus and attention.  In the past 6 to 12 months patient is having increasing word finding difficulties, blurred vision, numbness, memory loss, difficulty concentrating, slurred speech.  Patient has been seeing psychiatrist and psychologist.  She has recently been diagnosed with bipolar disorder rather than depression and anxiety.  She has been on mirtazapine and Abilify for the past few months.  Memory loss has slightly improved since she has started Abilify.  Patient also has diffuse pain, back pain, dropping things, hands going numb.  Patient had MRI of the brain by PCP which showed nonspecific T2 hyperintensities.   REVIEW OF SYSTEMS: Full 14 system review of systems performed and negative with exception of: Snoring sleepiness excessive fatigue weight gain blurred vision joint pain shortness of breath feeling hot feeling cold flushing easy bruising.  Depression anxiety racing thoughts.  ALLERGIES: Allergies  Allergen Reactions  . Meloxicam   . Penicillins      HOME MEDICATIONS: Outpatient Medications Prior to Visit  Medication Sig Dispense Refill  . ALPRAZolam (XANAX) 1 MG tablet Take 1 mg by mouth 3 (three) times daily as needed for anxiety.     Derrill Memo ON 11/07/2018] amphetamine-dextroamphetamine (ADDERALL) 10 MG tablet Take 1 tablet (10 mg total) by mouth 2 (two) times daily with a meal. 60 tablet 0  . ARIPiprazole (ABILIFY) 10 MG tablet Take 10 mg by mouth daily.    Marland Kitchen aspirin 81 MG chewable tablet Chew by mouth daily.    . cyanocobalamin (,VITAMIN B-12,) 1000 MCG/ML injection Inject 1 mL (1,000 mcg total) into the muscle every 30 (thirty) days. 3 mL 3  . hydrochlorothiazide (MICROZIDE) 12.5 MG capsule Take 1 capsule (12.5 mg total) by mouth daily. 90 capsule 0  . losartan (COZAAR) 50 MG tablet Take 1 tablet (50 mg total) by mouth daily. 90 tablet 0  . mirtazapine (REMERON) 45 MG tablet Take 45 mg by mouth at bedtime.  0  . TRI-SPRINTEC 0.18/0.215/0.25 MG-35 MCG tablet TAKE 1 TABLET BY MOUTH ONCE DAILY 84 tablet 0  . amphetamine-dextroamphetamine (ADDERALL) 10 MG tablet Take 1 tablet (10 mg total) by mouth 2 (two) times daily with a meal. 60 tablet 0  . amphetamine-dextroamphetamine (ADDERALL) 10 MG tablet Take 1 tablet (10 mg total) by mouth 2 (two) times daily with a meal. 60 tablet 0   No facility-administered medications prior to visit.     PAST MEDICAL HISTORY: Past Medical History:  Diagnosis Date  . Anxiety   . B12 deficiency   . Bipolar  1 disorder ([place])   . Bulging lumbar disc   . C. difficile diarrhea 04/24/2016   treated with metronidazole  . Depression   . Depression   . GERD (gastroesophageal reflux disease)   . History of chicken pox   . History of shingles 2013  . Hypertension   . Migraines   . Osteoarthritis   . PTSD (post-traumatic stress disorder)     PAST SURGICAL HISTORY: Past Surgical History:  Procedure Laterality Date  . BLADDER SURGERY  1981   Urethera stretched  . caps on teeth  1980   Caps on  all Teeth  . CESAREAN SECTION  2003  . TONSILLECTOMY      FAMILY HISTORY: Family History  Problem Relation Age of Onset  . Hearing loss Father   . CVA Maternal Grandmother   . Arthritis Paternal Grandmother   . Hearing loss Paternal Grandmother     SOCIAL HISTORY: Social History   Socioeconomic History  . Marital status: Married    Spouse name: Not on file  . Number of children: Not on file  . Years of education: Not on file  . Highest education level: Not on file  Occupational History  . Not on file  Social Needs  . Financial resource strain: Not on file  . Food insecurity:    Worry: Not on file    Inability: Not on file  . Transportation needs:    Medical: Not on file    Non-medical: Not on file  Tobacco Use  . Smoking status: Former Smoker    Last attempt to quit: 06/19/2011    Years since quitting: 7.3  . Smokeless tobacco: Never Used  Substance and Sexual Activity  . Alcohol use: Yes    Comment: rare  . Drug use: No  . Sexual activity: Yes    Birth control/protection: Pill  Lifestyle  . Physical activity:    Days per week: Not on file    Minutes per session: Not on file  . Stress: Not on file  Relationships  . Social connections:    Talks on phone: Not on file    Gets together: Not on file    Attends religious service: Not on file    Active member of club or organization: Not on file    Attends meetings of clubs or organizations: Not on file    Relationship status: Not on file  . Intimate partner violence:    Fear of current or ex partner: Not on file    Emotionally abused: Not on file    Physically abused: Not on file    Forced sexual activity: Not on file  Other Topics Concern  . 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 Wt Readings from Last 3 Encounters:  10/30/18 274 lb (124.3 kg)  09/17/18 256 lb 12.8 oz (116.5 kg)  08/26/18 252 lb 4 oz (114.4 kg)     Patient is in  no distress; well developed, nourished and groomed; neck is supple  CARDIOVASCULAR:  Examination of carotid arteries is normal; no carotid bruits  Regular rate and rhythm, no murmurs  Examination of peripheral vascular system by observation and palpation is normal  EYES:  Ophthalmoscopic exam of optic discs and posterior segments is normal; no papilledema or hemorrhages  Visual Acuity Screening   Right eye Left eye Both eyes  Without correction: 20/30 20/30   With correction:        MUSCULOSKELETAL:  Gait, strength, tone, movements noted in Neurologic exam below  NEUROLOGIC: MENTAL STATUS:  MMSE - Mini Mental State Exam 10/30/2018  Orientation to time 5  Orientation to Place 5  Registration 3  Attention/ Calculation 5  Recall 3  Language- name 2 objects 2  Language- repeat 1  Language- follow 3 step command 3  Language- read & follow direction 1  Write a sentence 1  Copy design 1  Total score 30    awake, alert, oriented to person, place and time  recent and remote memory intact  normal attention and concentration  language fluent, comprehension intact, naming intact  fund of knowledge appropriate  CRANIAL NERVE:   2nd - no papilledema on fundoscopic exam  2nd, 3rd, 4th, 6th - pupils equal and reactive to light, visual fields full to confrontation, extraocular muscles intact, no nystagmus  5th - facial sensation symmetric  7th - facial strength symmetric  8th - hearing intact  9th - palate elevates symmetrically, uvula midline  11th - shoulder shrug symmetric  12th  - tongue protrusion midline  MOTOR:   normal bulk and tone, full strength in the BUE, BLE  SENSORY:   normal and symmetric to light touch, temperature, vibration  COORDINATION:   finger-nose-finger, fine finger movements normal  REFLEXES:   deep tendon reflexes present and symmetric  GAIT/STATION:   narrow based gait     DIAGNOSTIC DATA (LABS, IMAGING, TESTING) - I reviewed patient records, labs, notes, testing and imaging myself where available.  Lab Results  Component Value Date   WBC 9.6 08/26/2018   HGB 12.9 08/26/2018   HCT 39.6 08/26/2018   MCV 85.1 08/26/2018   PLT 411.0 (H) 08/26/2018      Component Value Date/Time   NA 138 09/17/2018 0936   NA 136 (A) 03/26/2016   K 3.9 09/17/2018 0936   CL 100 09/17/2018 0936   CO2 29 09/17/2018 0936   GLUCOSE 96 09/17/2018 0936   BUN 11 09/17/2018 0936   BUN 12 03/26/2016   CREATININE 0.93 09/17/2018 0936   CALCIUM 9.4 09/17/2018 0936   PROT 7.8 08/26/2018 1246   ALBUMIN 3.9 08/26/2018 1246   AST 33 08/26/2018 1246   ALT 34 08/26/2018 1246   ALKPHOS 63 08/26/2018 1246   BILITOT 0.3 08/26/2018 1246   Lab Results  Component Value Date   CHOL 137 03/26/2016   HDL 34 (A) 03/26/2016   LDLCALC 86 03/26/2016   TRIG 189 (A) 03/26/2016   Lab Results  Component Value Date   HGBA1C 5.9 04/08/2018   Lab Results  Component Value Date   VITAMINB12 276 08/26/2018   Lab Results  Component Value Date   TSH 2.89 08/26/2018    08/27/18 MRI brain [I reviewed images myself and agree with interpretation. -VRP]  1. Subcortical T2 signal changes over the convexities bilaterally. This is moderately advanced for age. This raises concern for underlying vasculopathy. Hypertension or migraine headaches could give a similar appearance. There is no  hemorrhage or susceptibility to suggest that these are posttraumatic.  2. No acute intracranial abnormality.     ASSESSMENT AND PLAN  42 y.o. year old female here with  history of ADHD, bipolar disorder, PTSD, here for evaluation of worsening memory loss, numbness, blurred vision, word finding difficulties.  MRI of the brain reviewed which shows nonspecific T2 hyperintensities, which could be related to underlying autoimmune, inflammatory demyelinating conditions in addition to migraine or small vessel disease.  I recommend to proceed with further work-up.  However this is complicated as patient is currently uninsured and unable to afford additional testing.  She is planning to apply for health insurance with her family in the next month.  Patient will think about testing and financial situation let us know.   Dx:  1. Memory loss   2. Numbness   3. Blurred vision       PLAN:  MEMORY LOSS / NUMBNESS / BLURRED VISION / WORD FINDING DIFFICULTY - recommend to check MRI brain w/wo, c-spine w/wo; to eval for autoimmune / inflamm - recommend to check lumbar puncture - recommend to check sleep study - patient will think about testing and let us know when to proceed (currently uninsured) - continue bipolar and ADHD treatments (per psychiatry)  Return pending symptoms; if worsen or fail to improve. pending testing    Penni Bombard, MD 24/23/5361, 4:43 AM Certified in Neurology, Neurophysiology and [facility] [address] [place], [place] [address] [phone]

## 2018-10-30 NOTE — Patient Instructions (Signed)
MEMORY LOSS / NUMBNESS / BLURRED VISION / WORD FINDING DIFFICULTY - recommend to check MRI brain, c-spine w/wo; to eval for autoimmune / inflamm - recommend to check lumbar puncture - recommend to check sleep study - think about testing and let us know when to proceed - continue bipolar and ADHD treatments (per psychiatry)

## 2018-11-04 ENCOUNTER — Other Ambulatory Visit: Payer: Self-pay | Admitting: Physician Assistant

## 2018-11-06 ENCOUNTER — Other Ambulatory Visit: Payer: Self-pay | Admitting: Physician Assistant

## 2018-12-23 ENCOUNTER — Encounter: Payer: Self-pay | Admitting: Physician Assistant

## 2018-12-23 ENCOUNTER — Ambulatory Visit: Payer: Self-pay | Admitting: Physician Assistant

## 2018-12-23 ENCOUNTER — Ambulatory Visit (INDEPENDENT_AMBULATORY_CARE_PROVIDER_SITE_OTHER): Payer: Self-pay

## 2018-12-23 VITALS — BP 130/82 | HR 71 | Temp 98.7°F | Ht 66.0 in | Wt 275.2 lb

## 2018-12-23 DIAGNOSIS — M545 Low back pain, unspecified: Secondary | ICD-10-CM

## 2018-12-23 MED ORDER — PREDNISONE 5 MG PO TABS: ORAL_TABLET | ORAL | 0 refills | Status: DC

## 2018-12-23 NOTE — Patient Instructions (Addendum)
preIt was great to see you!  May trial Naproxen, this is the recommendation for dosage: Initial: 500 mg, followed by 500 mg every 12 hours or 250 mg every 6 to 8 hours; maximum daily dose: Day 1: 1,250 mg; subsequent daily doses should not exceed 1,000 mg  Make sure that you take a daily antacid medication. This could include: omeprazole or pepcid. Take on an empty stomach. This will help prevent further stomach irritation.  Continue to work on low back stretches and weight loss.  I have also sent in oral prednisone taper if you need it. Its a 6-day course. If you end up needing to take this, hold your naproxen or ibuprofen. Also, make sure you tell your husband you are taking this in case it affects your mood.  I will be in touch with your xray.  Take care,  Inda Coke PA-C

## 2018-12-23 NOTE — Progress Notes (Signed)
Crystal Bean is a 43 y.o. female here for a recurrent problem.  I acted as a Education administrator for [employer], PA-C Anselmo Pickler, LPN  History of Present Illness:   Chief Complaint  Patient presents with  . Back Pain    Back Pain  This is a chronic problem. Episode onset: Pt has had chronci pain x 16-18 years. The past 3-6 months pain has gotten worse. The problem occurs daily. The problem has been gradually worsening since onset. The pain is present in the lumbar spine (Goes between left and right side. Pain today is left side.). The pain radiates to the left thigh and right thigh (Hips also). Pain scale: Pt has no pain right now. Pt had pain this morning 4/10. Pain severity now: Moderate to Severe. The pain is worse during the night. The symptoms are aggravated by bending, twisting, standing, sitting and position. Stiffness is present in the morning (If pt sits too long then she has trouble getting up.). Associated symptoms include leg pain. Pertinent negatives include no bladder incontinence, bowel incontinence, dysuria, fever, numbness, paresthesias, perianal numbness, tingling or weakness. (Pt having Left knee pain too.) Risk factors: Hx Osteoarthritis. She has tried heat (Ibuprofen and Tylenol) for the symptoms. The treatment provided mild relief.   Taking around the clock ibuprofen and tylenol. Has an allergy to meloxicam -- causes full body rash.  Has had xray of lower back in the past, was told that she had osteoarthritis and bone spurs. Has seen chiropractor and PT in the past when she had insurance and was able to afford it. She found both to be helpful.  Wt Readings from Last 5 Encounters:  12/23/18 275 lb 4 oz (124.9 kg)  10/30/18 274 lb (124.3 kg)  09/17/18 256 lb 12.8 oz (116.5 kg)  08/26/18 252 lb 4 oz (114.4 kg)  07/14/18 249 lb 12.8 oz (113.3 kg)      Past Medical History:  Diagnosis Date  . Anxiety   . B12 deficiency   . Bipolar 1 disorder ([place])   . Bulging lumbar  disc   . C. difficile diarrhea 04/24/2016   treated with metronidazole  . Depression   . Depression   . GERD (gastroesophageal reflux disease)   . History of chicken pox   . History of shingles 2013  . Hypertension   . Migraines   . Osteoarthritis   . PTSD (post-traumatic stress disorder)      Social History   Socioeconomic History  . Marital status: Married    Spouse name: Not on file  . Number of children: Not on file  . Years of education: Not on file  . Highest education level: Not on file  Occupational History  . Not on file  Social Needs  . Financial resource strain: Not on file  . Food insecurity:    Worry: Not on file    Inability: Not on file  . Transportation needs:    Medical: Not on file    Non-medical: Not on file  Tobacco Use  . Smoking status: Former Smoker    Last attempt to quit: 06/19/2011    Years since quitting: 7.5  . Smokeless tobacco: Never Used  Substance and Sexual Activity  . Alcohol use: Yes    Comment: rare  . Drug use: No  . Sexual activity: Yes    Birth control/protection: Pill  Lifestyle  . Physical activity:    Days per week: Not on file    Minutes per session:  Not on file  . Stress: Not on file  Relationships  . Social connections:    Talks on phone: Not on file    Gets together: Not on file    Attends religious service: Not on file    Active member of club or organization: Not on file    Attends meetings of clubs or organizations: Not on file    Relationship status: Not on file  . Intimate partner violence:    Fear of current or ex partner: Not on file    Emotionally abused: Not on file    Physically abused: Not on file    Forced sexual activity: Not on file  Other Topics Concern  . 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 BLADDER SURGERY  1981   Urethera stretched  . caps on teeth  1980   Caps on all Teeth  . CESAREAN SECTION  2003  . TONSILLECTOMY      Family History  Problem Relation Age of Onset  . Hearing loss Father   . CVA Maternal Grandmother   . Arthritis Paternal Grandmother   . Hearing loss Paternal Grandmother     Allergies  Allergen Reactions  . Meloxicam   . Penicillins     Current Medications:   Current Outpatient Medications:  .  ALPRAZolam (XANAX) 1 MG tablet, Take 1 mg by mouth 3 (three) times daily as needed for anxiety. , Disp: , Rfl:  .  ARIPiprazole (ABILIFY) 10 MG tablet, Take 10 mg by mouth daily., Disp: , Rfl:  .  aspirin 81 MG chewable tablet, Chew by mouth daily., Disp: , Rfl:  .  cyanocobalamin (,VITAMIN B-12,) 1000 MCG/ML injection, Inject 1 mL (1,000 mcg total) into the muscle every 30 (thirty) days., Disp: 3 mL, Rfl: 3 .  hydrochlorothiazide (MICROZIDE) 12.5 MG capsule, Take 1 capsule (12.5 mg total) by mouth daily., Disp: 90 capsule, Rfl: 0 .  losartan (COZAAR) 50 MG tablet, Take 1 tablet (50 mg total) by mouth daily., Disp: 90 tablet, Rfl: 0 .  mirtazapine (REMERON) 30 MG tablet, Take 30 mg by mouth at bedtime., Disp: , Rfl:  .  TRI-SPRINTEC 0.18/0.215/0.25 MG-35 MCG tablet, TAKE 1 TABLET BY MOUTH ONCE DAILY, Disp: 84 tablet, Rfl: 0 .  venlafaxine XR (EFFEXOR-XR) 37.5 MG 24 hr capsule, Take 37.5 mg by mouth daily., Disp: , Rfl:  .  amphetamine-dextroamphetamine (ADDERALL) 10 MG tablet, Take 1 tablet (10 mg total) by mouth 2 (two) times daily with a meal., Disp: 60 tablet, Rfl: 0 .  predniSONE (DELTASONE) 5 MG tablet, 6-5-4-3-2-1-off, Disp: 21 tablet, Rfl: 0   Review of Systems:   Review of Systems  Constitutional: Negative for fever.  Gastrointestinal: Negative for bowel incontinence.  Genitourinary: Negative for bladder incontinence and dysuria.   Musculoskeletal: Positive for back pain.  Neurological: Negative for tingling, weakness, numbness and paresthesias.    Vitals:   Vitals:   12/23/18 1330  BP: 130/82  Pulse: 71  Temp: 98.7 F (37.1 C)  TempSrc: Oral  SpO2: 95%  Weight: 275 lb 4 oz (124.9 kg)  Height: 5\' 6"  (1.676 m)  Body mass index is 44.43 kg/m.  Physical Exam:   Physical Exam Vitals signs and nursing note reviewed.  Constitutional:      General: She is not in acute distress.    Appearance: She is well-developed. She is not ill-appearing or toxic-appearing.  Cardiovascular:     Rate and Rhythm: Normal rate and regular rhythm.     Pulses: Normal pulses.     Heart sounds: Normal heart sounds, S1 normal and S2 normal.     Comments: No LE edema Pulmonary:     Effort: Pulmonary effort is normal.     Breath sounds: Normal breath sounds.  Skin:    General: Skin is warm and dry.  Neurological:     Mental Status: She is alert.     GCS: GCS eye subscore is 4. GCS verbal subscore is 5. GCS motor subscore is 6.  Psychiatric:        Speech: Speech normal.        Behavior: Behavior normal. Behavior is cooperative.     Lumbar xray: no acute issues on my read -- awaiting official radiology read   Assessment and Plan:   Sami was seen today for back pain.  Diagnoses and all orders for this visit:  Lumbar back pain -     DG Lumbar Spine Complete; Future  Other orders -     predniSONE (DELTASONE) 5 MG tablet; 6-5-4-3-2-1-off   No red flags on exam.  We are limited with options due to self pay status. She is not in acute, severe pain today. Recommend trialing Naproxen as well as GI protective medication, such as Prilosec or Pepcid, to see if this helps with symptoms. Additionally, I did give her a prescription for steroid dosepack of 5 mg if she has an acute flare. Her mood is pretty well controlled at this time, she states that she is not having any manic episodes. We did review the risk of taking  prednisone and possible mood changes. She states that if she decides to take this medication she will alert her husband as well as discontinue NSAIDs during that time. Follow-up as needed. Further recommendations based on xray results as well.   . Reviewed expectations re: course of current medical issues. . Discussed self-management of symptoms. . Outlined signs and symptoms indicating need for more acute intervention. . Patient verbalized understanding and all questions were answered. . See orders for this visit as documented in the electronic medical record. . Patient received an After-Visit Summary.  CMA or LPN served as scribe during this visit. History, Physical, and Plan performed by medical provider. The above documentation has been reviewed and is accurate and complete.  Inda Coke, PA-C

## 2019-01-21 ENCOUNTER — Other Ambulatory Visit: Payer: Self-pay | Admitting: Physician Assistant

## 2019-01-26 ENCOUNTER — Other Ambulatory Visit: Payer: Self-pay | Admitting: Physician Assistant

## 2019-03-18 ENCOUNTER — Encounter: Payer: Self-pay | Admitting: Physician Assistant

## 2019-03-18 ENCOUNTER — Ambulatory Visit (INDEPENDENT_AMBULATORY_CARE_PROVIDER_SITE_OTHER): Payer: Self-pay | Admitting: Physician Assistant

## 2019-03-18 DIAGNOSIS — I10 Essential (primary) hypertension: Secondary | ICD-10-CM

## 2019-03-18 NOTE — Progress Notes (Signed)
Virtual Visit via Video   I connected with Crystal Bean on 03/18/19 at  8:40 AM EDT by a video enabled telemedicine application and verified that I am speaking with the correct person using two identifiers. Location patient: Home Location provider: [facility], Office Persons participating in the virtual visit: [name], [place]   I discussed the limitations of evaluation and management by telemedicine and the availability of in person appointments. The patient expressed understanding and agreed to proceed.  Subjective:   HPI:   HTN Currently taking HCTZ 12.5 mg, Losartan 50 mg. At home blood pressure readings are: well controlled. Patient denies chest pain, SOB, blurred vision, dizziness, unusual headaches, lower leg swelling. Patient is  compliant with medication. Denies excessive caffeine intake, stimulant usage, excessive alcohol intake, or increase in salt consumption.   ROS: See pertinent positives and negatives per HPI.  Patient Active Problem List   Diagnosis Date Noted  . Obsessive compulsive disorder 09/08/2018  . Bipolar 1 disorder ([place]) 08/26/2018  . Obesity 04/08/2018  . Essential hypertension 04/08/2018  . PTSD (post-traumatic stress disorder)   . Osteoarthritis   . Migraines   . Hypertension   . GERD (gastroesophageal reflux disease)   . Major depressive disorder, recurrent episode with mixed features ([place])   . Bulging lumbar disc     Social History   Tobacco Use  . Smoking status: Former Smoker    Last attempt to quit: 06/19/2011    Years since quitting: 7.7  . Smokeless tobacco: Never Used  Substance Use Topics  . Alcohol use: Yes    Comment: rare    Current Outpatient Medications:  .  amphetamine-dextroamphetamine (ADDERALL XR) 10 MG 24 hr capsule, Take 10 mg by mouth daily., Disp: , Rfl:  .  ARIPiprazole (ABILIFY) 10 MG tablet, Take 10 mg by mouth daily., Disp: , Rfl:  .  aspirin 81 MG chewable tablet, Chew by mouth daily., Disp:  , Rfl:  .  clonazePAM (KLONOPIN) 1 MG tablet, Take 1 mg by mouth 2 (two) times daily as needed for anxiety., Disp: , Rfl:  .  cyanocobalamin (,VITAMIN B-12,) 1000 MCG/ML injection, Inject 1 mL (1,000 mcg total) into the muscle every 30 (thirty) days., Disp: 3 mL, Rfl: 3 .  hydrochlorothiazide (MICROZIDE) 12.5 MG capsule, Take 1 capsule by mouth once daily, Disp: 90 capsule, Rfl: 0 .  losartan (COZAAR) 50 MG tablet, Take 1 tablet by mouth once daily, Disp: 90 tablet, Rfl: 0 .  mirtazapine (REMERON) 30 MG tablet, Take 30 mg by mouth at bedtime., Disp: , Rfl:  .  TRI-SPRINTEC 0.18/0.215/0.25 MG-35 MCG tablet, TAKE 1 TABLET BY MOUTH ONCE DAILY, Disp: 84 tablet, Rfl: 3 .  venlafaxine XR (EFFEXOR-XR) 150 MG 24 hr capsule, Take 150 mg by mouth daily with breakfast., Disp: , Rfl:  .  predniSONE (DELTASONE) 5 MG tablet, 6-5-4-3-2-1-off (Patient not taking: Reported on 03/18/2019), Disp: 21 tablet, Rfl: 0  Allergies  Allergen Reactions  . Meloxicam   . Penicillins     Objective:   VITALS: Per patient if applicable, see vitals. GENERAL: Alert, appears well and in no acute distress. HEENT: Atraumatic, conjunctiva clear, no obvious abnormalities on inspection of external nose and ears. NECK: Normal movements of the head and neck. CARDIOPULMONARY: No increased WOB. Speaking in clear sentences. I:E ratio WNL.  MS: Moves all visible extremities without noticeable abnormality. PSYCH: Pleasant and cooperative, well-groomed. Speech normal rate and rhythm. Affect is appropriate. Insight and judgement are appropriate.  Attention is focused, linear, and appropriate.  NEURO: CN grossly intact. Oriented as arrived to appointment on time with no prompting. Moves both UE equally.  SKIN: No obvious lesions, wounds, erythema, or cyanosis noted on face or hands.  Assessment and Plan:   [name] was seen today for hypertension.  Diagnoses and all orders for this visit:  Essential hypertension   Currently well  controlled. Continue current medications. Will have her return to office in about 3 months so we can update BMP, sooner if concerns.  . Reviewed expectations re: course of current medical issues. . Discussed self-management of symptoms. . Outlined signs and symptoms indicating need for more acute intervention. . Patient verbalized understanding and all questions were answered. Marland Kitchen Health Maintenance issues including appropriate healthy diet, exercise, and smoking avoidance were discussed with patient. . See orders for this visit as documented in the electronic medical record.  I discussed the assessment and treatment plan with the patient. The patient was provided an opportunity to ask questions and all were answered. The patient agreed with the plan and demonstrated an understanding of the instructions.   The patient was advised to call back or seek an in-person evaluation if the symptoms worsen or if the condition fails to improve as anticipated.    [place], [place] 03/18/2019

## 2019-04-27 ENCOUNTER — Other Ambulatory Visit: Payer: Self-pay | Admitting: Physician Assistant

## 2019-07-23 ENCOUNTER — Other Ambulatory Visit: Payer: Self-pay | Admitting: Physician Assistant

## 2019-08-02 ENCOUNTER — Encounter: Payer: Self-pay | Admitting: Physician Assistant

## 2019-08-02 ENCOUNTER — Ambulatory Visit: Payer: Self-pay | Admitting: Physician Assistant

## 2019-08-02 ENCOUNTER — Other Ambulatory Visit: Payer: Self-pay

## 2019-08-02 VITALS — BP 120/76 | HR 74 | Temp 97.9°F | Ht 66.0 in | Wt 274.2 lb

## 2019-08-02 DIAGNOSIS — M545 Low back pain, unspecified: Secondary | ICD-10-CM

## 2019-08-02 DIAGNOSIS — R209 Unspecified disturbances of skin sensation: Secondary | ICD-10-CM

## 2019-08-02 DIAGNOSIS — N3946 Mixed incontinence: Secondary | ICD-10-CM

## 2019-08-02 DIAGNOSIS — R413 Other amnesia: Secondary | ICD-10-CM

## 2019-08-02 LAB — CBC
HCT: 37.3 % (ref 36.0–46.0)
Hemoglobin: 12.2 g/dL (ref 12.0–15.0)
MCHC: 32.7 g/dL (ref 30.0–36.0)
MCV: 86.2 fl (ref 78.0–100.0)
Platelets: 369 10*3/uL (ref 150.0–400.0)
RBC: 4.32 Mil/uL (ref 3.87–5.11)
RDW: 16.8 % — ABNORMAL HIGH (ref 11.5–15.5)
WBC: 8.5 10*3/uL (ref 4.0–10.5)

## 2019-08-02 LAB — COMPREHENSIVE METABOLIC PANEL
ALT: 19 U/L (ref 0–35)
AST: 15 U/L (ref 0–37)
Albumin: 3.8 g/dL (ref 3.5–5.2)
Alkaline Phosphatase: 76 U/L (ref 39–117)
BUN: 12 mg/dL (ref 6–23)
CO2: 31 mEq/L (ref 19–32)
Calcium: 9.5 mg/dL (ref 8.4–10.5)
Chloride: 97 mEq/L (ref 96–112)
Creatinine, Ser: 0.89 mg/dL (ref 0.40–1.20)
GFR: 68.97 mL/min (ref >60.00)
Glucose, Bld: 99 mg/dL (ref 70–99)
Potassium: 4.4 mEq/L (ref 3.5–5.1)
Sodium: 137 mEq/L (ref 135–145)
Total Bilirubin: 0.3 mg/dL (ref 0.2–1.2)
Total Protein: 7 g/dL (ref 6.0–8.3)

## 2019-08-02 LAB — HEMOGLOBIN A1C: Hgb A1c MFr Bld: 6.2 % (ref 4.6–6.5)

## 2019-08-02 LAB — VITAMIN B12: Vitamin B-12: 1072 pg/mL — ABNORMAL HIGH (ref 211–911)

## 2019-08-02 MED ORDER — GABAPENTIN 100 MG PO CAPS: ORAL_CAPSULE | ORAL | 0 refills | Status: DC

## 2019-08-02 NOTE — Patient Instructions (Addendum)
It was great to see you!  1. For your skin -- lets check labs. Try to document if there are any triggers that could be causing this (stress, change in temperature, food, etc.) If no improvement, we can discuss in November.  2. For your back -- lets start gabapentin. Please note that this can affect your mood. We are going to start a very low dose of 100 mg nightly, and may increase as tolerated to 100 mg three times daily. If it causes severe worsening in your mood, please stop immediately and tell someone. Eventually, I think we should get you into a physical therapist.  3. For your incontinence -- lets check labs. Look at the bladder training info and try to work on this.  We will address any lab abnormalities as they come up.  Let's follow-up in 2 months, sooner if you have concerns.  Take care,  Inda Coke PA-C

## 2019-08-02 NOTE — Progress Notes (Signed)
Crystal Bean is a 43 y.o. female here for a new problem.  I acted as a Education administrator for [employer], PA-C Anselmo Pickler, LPN  History of Present Illness:   Chief Complaint  Patient presents with  . c/o skin hurts  . Urinary Incontinence    HPI   Stinging skin Patient reports that her skin hurts from time to time is mostly on her bilateral upper arms and upper back.  Started a few months ago.  It essentially unchanged since it started.  It happens about 3 to 4 days a week.  She is uncertain if she has any triggers, including anxiety, foods, environmental exposures.  She denies rash, weakness, joint pain, numbness in bilateral arms.  Denies family history of any autoimmune disorders.  The only thing that she has tried for this is wrapping herself in a blanket and this does provide almost immediate relief of symptoms.  She does do monthly B12 injections, and she is due for one tomorrow.  Urinary Incontinence Pt c/o incontinence for 6- 8 months. Pt is c/o urge and stress incontinence. She is having to wear a pad.  She denies any concerns for UTI, denies dysuria, and also denies any changes in bowels.  She feels like her symptoms are getting worse with time.  Lower back pain She started to have worsening low back pain.  I addressed this back in February 2020.  She is having issues of's standing for even 5 minutes of doing dishes she feels like she has to sit down because of her pain in her back and lower legs.  She is getting insurance in November, and has done PT before.  She cannot tolerate meloxicam, but she is able to take ibuprofen with some relief.  She has not had any significant weight change since I last saw her.  Denies saddle anesthesia, numbness/tingling of lower extremities, bowel/bladder incontinence.  Memory loss She continues to have issues with memory loss.  We actually sent her to Dr. Andrey Spearman for this, and she has not followed up.    Past Medical History:   Diagnosis Date  . Anxiety   . B12 deficiency   . Bipolar 1 disorder ([place])   . Bulging lumbar disc   . C. difficile diarrhea 04/24/2016   treated with metronidazole  . Depression   . Depression   . GERD (gastroesophageal reflux disease)   . History of chicken pox   . History of shingles 2013  . Hypertension   . Migraines   . Osteoarthritis   . PTSD (post-traumatic stress disorder)      Social History   Socioeconomic History  . Marital status: Married    Spouse name: Not on file  . Number of children: Not on file  . Years of education: Not on file  . Highest education level: Not on file  Occupational History  . Not on file  Social Needs  . Financial resource strain: Not on file  . Food insecurity    Worry: Not on file    Inability: Not on file  . Transportation needs    Medical: Not on file    Non-medical: Not on file  Tobacco Use  . Smoking status: Former Smoker    Quit date: 06/19/2011    Years since quitting: 8.1  . Smokeless tobacco: Never Used  Substance and Sexual Activity  . Alcohol use: Yes    Comment: rare  . Drug use: No  . Sexual activity: Yes  Birth control/protection: Pill  Lifestyle  . Physical activity    Days per week: Not on file    Minutes per session: Not on file  . Stress: Not on file  Relationships  . Social Herbalist on phone: Not on file    Gets together: Not on file    Attends religious service: Not on file    Active member of club or organization: Not on file    Attends meetings of clubs or organizations: Not on file    Relationship status: Not on file  . Intimate partner violence    Fear of current or ex partner: Not on file    Emotionally abused: Not on file    Physically abused: Not on file    Forced sexual activity: Not on file  Other Topics Concern  . 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 BLADDER SURGERY  1981   Urethera stretched  . caps on teeth  1980   Caps on all Teeth  . CESAREAN SECTION  2003  . TONSILLECTOMY      Family History  Problem Relation Age of Onset  . Hearing loss Father   . CVA Maternal Grandmother   . Arthritis Paternal Grandmother   . Hearing loss Paternal Grandmother     Allergies  Allergen Reactions  . Meloxicam   . Penicillins     Current Medications:   Current Outpatient Medications:  .  amphetamine-dextroamphetamine (ADDERALL XR) 10 MG 24 hr capsule, Take 10 mg by mouth daily., Disp: , Rfl:  .  ARIPiprazole (ABILIFY) 10 MG tablet, Take 10 mg by mouth daily., Disp: , Rfl:  .  aspirin 81 MG chewable tablet, Chew by mouth daily., Disp: , Rfl:  .  clonazePAM (KLONOPIN) 1 MG tablet, Take 1 mg by mouth 2 (two) times daily as needed for anxiety., Disp: , Rfl:  .  cyanocobalamin (,VITAMIN B-12,) 1000 MCG/ML injection, Inject 1 mL (1,000 mcg total) into the muscle every 30 (thirty) days., Disp: 3 mL, Rfl: 3 .  hydrochlorothiazide (MICROZIDE) 12.5 MG capsule, Take 1 capsule by mouth once daily, Disp: 90 capsule, Rfl: 0 .  losartan (COZAAR) 50 MG tablet, Take 1 tablet by mouth once daily, Disp: 90 tablet, Rfl: 0 .  mirtazapine (REMERON) 30 MG tablet, Take 30 mg by mouth at bedtime., Disp: , Rfl:  .  TRI-SPRINTEC 0.18/0.215/0.25 MG-35 MCG tablet, TAKE 1 TABLET BY MOUTH ONCE DAILY, Disp: 84 tablet, Rfl: 3 .  venlafaxine XR (EFFEXOR-XR) 150 MG 24 hr capsule, Take 150 mg by mouth daily with breakfast., Disp: , Rfl:  .  venlafaxine XR (EFFEXOR-XR) 75 MG 24 hr capsule, Take 75 mg by mouth daily with breakfast., Disp: , Rfl:  .  gabapentin (NEURONTIN) 100 MG capsule, Take one capsule daily. May increase as tolerated to 3 capsules daily., Disp: 90 capsule, Rfl: 0    Review of Systems:   ROS Negative unless otherwise specified per HPI.  Vitals:   Vitals:   08/02/19 0818  BP: 120/76  Pulse: 74  Temp: 97.9 F (36.6 C)  TempSrc: Temporal  SpO2: 97%  Weight: 274 lb 4 oz (124.4 kg)  Height: 5\' 6"  (1.676 m)     Body mass index is 44.27 kg/m.  Physical Exam:   Physical Exam Vitals signs and nursing note reviewed.  Constitutional:      General: She is not in acute distress.    Appearance: She is well-developed. She is not ill-appearing or toxic-appearing.  Cardiovascular:     Rate and Rhythm: Normal rate and regular rhythm.     Pulses: Normal pulses.     Heart sounds: Normal heart sounds, S1 normal and S2 normal.     Comments: No LE edema Pulmonary:     Effort: Pulmonary effort is normal.     Breath sounds: Normal breath sounds.  Musculoskeletal:     Comments: No decreased ROM 2/2 pain with flexion/extension, lateral side bends, or rotation. Reproducible tenderness with deep palpation to bilateral paraspinal lumbar muscles. No bony tenderness. No evidence of erythema, rash or ecchymosis. Negative STLR bilaterally.   Normal range of motion of arms.  Skin:    General: Skin is warm and dry.     Comments: No changes to skin on bilateral arms/upper back  Neurological:     Mental Status: She is alert.     GCS: GCS eye subscore is 4. GCS verbal subscore is 5. GCS motor subscore is 6.     Comments: Grip strength 5 out of 5 bilaterally, normal sensation to bilateral lower extremity  Psychiatric:        Speech: Speech normal.        Behavior: Behavior normal. Behavior is cooperative.      Assessment and Plan:   Deshonda was seen today for c/o skin hurts and urinary incontinence.  Diagnoses and all orders for this visit:  Skin sensation disturbance Unclear etiology.  We did discuss potential differential diagnoses including somatic disturbance, idiopathic, rheumatological, neurological.  We will recheck a B12, as she has been giving  herself B12 injections monthly and we do not have a recent B12 on her.  We will also update CBC and CMP.  I asked her to keep a log of her symptoms and write down triggers whether they be emotional, environmental, food etc. so we can review this together.  If symptoms persist, will consider doing a autoimmune work-up when she has insurance in November. -     Vitamin B12 -     Comprehensive metabolic panel -     CBC  Mixed stress and urge urinary incontinence Discussed need for bladder training.  I gave her information on this.  I think after we follow-up in November, if she still having issues with this, she would benefit from pelvic floor PT. -     Hemoglobin A1c  Lumbar back pain Going to trial gabapentin.  Will start 100 mg daily, with instructions to increase up to 300 mg daily based on tolerance.  Did discuss that this medication does potentially affect mood, so she must reach out to someone if she has any severe change in mood or other concerning symptoms. I discussed with patient that if they develop any SI, to tell someone immediately and seek medical attention.  I also recommended that we likely start physical therapy when her insurance begins in November. -     Hemoglobin A1c  Memory loss I recommended that she reach out to Dr. Andrey Spearman for follow-up regarding this.  Other orders -     gabapentin (NEURONTIN) 100 MG capsule; Take one capsule daily. May increase  as tolerated to 3 capsules daily.  . Reviewed expectations re: course of current medical issues. . Discussed self-management of symptoms. . Outlined signs and symptoms indicating need for more acute intervention. . Patient verbalized understanding and all questions were answered. . See orders for this visit as documented in the electronic medical record. . Patient received an After-Visit Summary.  CMA or LPN served as scribe during this visit. History, Physical, and Plan performed by medical provider. The above  documentation has been reviewed and is accurate and complete.   Inda Coke, PA-C

## 2019-08-02 NOTE — Progress Notes (Deleted)
I acted as a Education administrator for [employer], PA-C Anselmo Pickler, LPN   Subjective:    Crystal Bean is a 43 y.o. female and is here for a comprehensive physical exam.   HPI  Health Maintenance Due  Topic Date Due  . HIV Screening  10/14/1990  . PAP SMEAR-Modifier  10/14/1996    Acute Concerns:   Chronic Issues:   Health Maintenance: Immunizations -- UTD Colonoscopy -- N/A Mammogram --  PAP --  Bone Density -- N/A Diet -- *** Caffeine intake -- *** Sleep habits -- *** Exercise -- *** Weight --    Mood -- *** Weight history: Wt Readings from Last 10 Encounters:  12/23/18 275 lb 4 oz (124.9 kg)  10/30/18 274 lb (124.3 kg)  09/17/18 256 lb 12.8 oz (116.5 kg)  08/26/18 252 lb 4 oz (114.4 kg)  07/14/18 249 lb 12.8 oz (113.3 kg)  05/22/18 241 lb 8 oz (109.5 kg)  04/29/18 245 lb (111.1 kg)  04/08/18 257 lb 4 oz (116.7 kg)  05/10/17 253 lb 4 oz (114.9 kg)   No LMP recorded. (Menstrual status: Oral contraceptives). Period characteristics: Alcohol use: Tobacco use:  Depression screen [facility] 2/9 07/14/2018  Decreased Interest 3  Down, Depressed, Hopeless 3  PHQ - 2 Score 6  Altered sleeping 3  Tired, decreased energy 3  Change in appetite 3  Feeling bad or failure about yourself  3  Trouble concentrating 3  Moving slowly or fidgety/restless 3  Suicidal thoughts (No Data)  PHQ-9 Score 24  Difficult doing work/chores Extremely dIfficult     Other providers/specialists: Patient Care Team: Inda Coke, [place] as PCP - General (Physician Assistant) Artist Beach, MD as Consulting Physician (Psychiatry)    PMHx, SurgHx, SocialHx, Medications, and Allergies were reviewed in the Visit Navigator and updated as appropriate.   Past Medical History:  Diagnosis Date  . Anxiety   . B12 deficiency   . Bipolar 1 disorder ([place])   . Bulging lumbar disc   . C. difficile diarrhea 04/24/2016   treated with metronidazole  . Depression   . Depression   . GERD  (gastroesophageal reflux disease)   . History of chicken pox   . History of shingles 2013  . Hypertension   . Migraines   . Osteoarthritis   . PTSD (post-traumatic stress disorder)     Past Surgical History:  Procedure Laterality Date  . BLADDER SURGERY  1981   Urethera stretched  . caps on teeth  1980   Caps on all Teeth  . CESAREAN SECTION  2003  . TONSILLECTOMY      Family History  Problem Relation Age of Onset  . Hearing loss Father   . CVA Maternal Grandmother   . Arthritis Paternal Grandmother   . Hearing loss Paternal Grandmother     Social History   Tobacco Use  . Smoking status: Former Smoker    Quit date: 06/19/2011    Years since quitting: 8.1  . Smokeless tobacco: Never Used  Substance Use Topics  . Alcohol use: Yes    Comment: rare  . Drug use: No    Review of Systems:   ROS  Objective:   There were no vitals taken for this visit. There is no height or weight on file to calculate BMI.   General Appearance:    Alert, cooperative, no distress, appears stated age  Head:    Normocephalic, without obvious abnormality, atraumatic  Eyes:    PERRL, conjunctiva/corneas clear,  EOM's intact, fundi    benign, both eyes  Ears:    Normal TM's and external ear canals, both ears  Nose:   Nares normal, septum midline, mucosa normal, no drainage    or sinus tenderness  Throat:   Lips, mucosa, and tongue normal; teeth and gums normal  Neck:   Supple, symmetrical, trachea midline, no adenopathy;    thyroid:  no enlargement/tenderness/nodules; no carotid   bruit or JVD  Back:     Symmetric, no curvature, ROM normal, no CVA tenderness  Lungs:     Clear to auscultation bilaterally, respirations unlabored  Chest Wall:    No tenderness or deformity   Heart:    Regular rate and rhythm, S1 and S2 normal, no murmur, rub or gallop  Breast Exam:    No tenderness, masses, or nipple abnormality  Abdomen:     Soft, non-tender, bowel sounds active all four quadrants,    no  masses, no organomegaly  Genitalia:    Normal female without lesion, discharge or tenderness  Extremities:   Extremities normal, atraumatic, no cyanosis or edema  Pulses:   2+ and symmetric all extremities  Skin:   Skin color, texture, turgor normal, no rashes or lesions  Lymph nodes:   Cervical, supraclavicular, and axillary nodes normal  Neurologic:   CNII-XII intact, normal strength, sensation and reflexes    throughout   Results for orders placed or performed in visit on [id number]  Basic metabolic panel  Result Value Ref Range   Sodium 138 135 - 145 mEq/L   Potassium 3.9 3.5 - 5.1 mEq/L   Chloride 100 96 - 112 mEq/L   CO2 29 19 - 32 mEq/L   Glucose, Bld 96 70 - 99 mg/dL   BUN 11 6 - 23 mg/dL   Creatinine, Ser 0.93 0.40 - 1.20 mg/dL   Calcium 9.4 8.4 - 10.5 mg/dL   GFR 69.96 >60.00 mL/min    Assessment/Plan:   There are no diagnoses linked to this encounter.   Well Adult Exam: Labs ordered: {Yes/No:18319::"Yes"}. Patient counseling was done. See below for items discussed. Discussed the patient's BMI.  The {BMI plan (MU NQF measure 421):19504} Follow up {follow-up interval:13814}. Breast cancer screening: ***. Cervical cancer screening: ***   Patient Counseling: [x]    Nutrition: Stressed importance of moderation in sodium/caffeine intake, saturated fat and cholesterol, caloric balance, sufficient intake of fresh fruits, vegetables, fiber, calcium, iron, and 1 mg of folate supplement per day (for females capable of pregnancy).  [x]    Stressed the importance of regular exercise.   [x]    Substance Abuse: Discussed cessation/primary prevention of tobacco, alcohol, or other drug use; driving or other dangerous activities under the influence; availability of treatment for abuse.   [x]    Injury prevention: Discussed safety belts, safety helmets, smoke detector, smoking near bedding or upholstery.   [x]    Sexuality: Discussed sexually transmitted diseases, partner selection, use of  condoms, avoidance of unintended pregnancy  and contraceptive alternatives.  [x]    Dental health: Discussed importance of regular tooth brushing, flossing, and dental visits.  [x]    Health maintenance and immunizations reviewed. Please refer to Health maintenance section.   ***  Inda Coke, PA-C [place]

## 2019-08-30 ENCOUNTER — Other Ambulatory Visit: Payer: Self-pay | Admitting: Physician Assistant

## 2019-09-26 ENCOUNTER — Other Ambulatory Visit: Payer: Self-pay | Admitting: Physician Assistant

## 2019-10-04 ENCOUNTER — Encounter: Payer: Self-pay | Admitting: Physician Assistant

## 2019-10-04 ENCOUNTER — Other Ambulatory Visit: Payer: Self-pay

## 2019-10-04 ENCOUNTER — Ambulatory Visit (INDEPENDENT_AMBULATORY_CARE_PROVIDER_SITE_OTHER): Payer: Medicare Other | Admitting: Physician Assistant

## 2019-10-04 VITALS — BP 110/78 | HR 80 | Temp 97.3°F | Ht 66.0 in | Wt 273.0 lb

## 2019-10-04 DIAGNOSIS — M545 Low back pain, unspecified: Secondary | ICD-10-CM

## 2019-10-04 DIAGNOSIS — F319 Bipolar disorder, unspecified: Secondary | ICD-10-CM | POA: Diagnosis not present

## 2019-10-04 DIAGNOSIS — E669 Obesity, unspecified: Secondary | ICD-10-CM | POA: Diagnosis not present

## 2019-10-04 MED ORDER — DICLOFENAC SODIUM 75 MG PO TBEC: 75.0000 mg | DELAYED_RELEASE_TABLET | Freq: Two times a day (BID) | ORAL | 0 refills | Status: DC

## 2019-10-04 MED ORDER — METFORMIN HCL ER 500 MG PO TB24: 500.0000 mg | ORAL_TABLET | Freq: Every day | ORAL | 0 refills | Status: DC

## 2019-10-04 NOTE — Patient Instructions (Signed)
It was great to see you!  Please call your psychiatrist to schedule an appointment -- I will reach out to [facility].  Return at your convenience for your 1 hour Welcome to Medicare Visit with me.  Start metformin and diclofenac.  Take care,  Inda Coke PA-C

## 2019-10-04 NOTE — Progress Notes (Deleted)
I acted as a Education administrator for [employer], PA-C Anselmo Pickler, LPN   Subjective:    Crystal Bean is a 43 y.o. female and is here for a comprehensive physical exam.   HPI  Health Maintenance Due  Topic Date Due  . PAP SMEAR-Modifier  10/14/1996    Acute Concerns:   Chronic Issues:   Health Maintenance: Immunizations -- UTD Colonoscopy -- N/A Mammogram -- overdue PAP -- overdue Bone Density -- N/A Diet -- *** Caffeine intake -- *** Sleep habits -- *** Exercise -- *** Weight --    Mood -- *** Weight history: Wt Readings from Last 10 Encounters:  08/02/19 274 lb 4 oz (124.4 kg)  12/23/18 275 lb 4 oz (124.9 kg)  10/30/18 274 lb (124.3 kg)  09/17/18 256 lb 12.8 oz (116.5 kg)  08/26/18 252 lb 4 oz (114.4 kg)  07/14/18 249 lb 12.8 oz (113.3 kg)  05/22/18 241 lb 8 oz (109.5 kg)  04/29/18 245 lb (111.1 kg)  04/08/18 257 lb 4 oz (116.7 kg)  05/10/17 253 lb 4 oz (114.9 kg)   No LMP recorded. (Menstrual status: Oral contraceptives). Period characteristics: Alcohol use: Tobacco use:  Depression screen [facility] 2/9 07/14/2018  Decreased Interest 3  Down, Depressed, Hopeless 3  PHQ - 2 Score 6  Altered sleeping 3  Tired, decreased energy 3  Change in appetite 3  Feeling bad or failure about yourself  3  Trouble concentrating 3  Moving slowly or fidgety/restless 3  Suicidal thoughts (No Data)  PHQ-9 Score 24  Difficult doing work/chores Extremely dIfficult     Other providers/specialists: Patient Care Team: Inda Coke, [place] as PCP - General (Physician Assistant) Artist Beach, MD as Consulting Physician (Psychiatry)    PMHx, SurgHx, SocialHx, Medications, and Allergies were reviewed in the Visit Navigator and updated as appropriate.   Past Medical History:  Diagnosis Date  . Anxiety   . B12 deficiency   . Bipolar 1 disorder ([place])   . Bulging lumbar disc   . C. difficile diarrhea 04/24/2016   treated with metronidazole  . Depression   .  Depression   . GERD (gastroesophageal reflux disease)   . History of chicken pox   . History of shingles 2013  . Hypertension   . Migraines   . Osteoarthritis   . PTSD (post-traumatic stress disorder)     Past Surgical History:  Procedure Laterality Date  . BLADDER SURGERY  1981   Urethera stretched  . caps on teeth  1980   Caps on all Teeth  . CESAREAN SECTION  2003  . TONSILLECTOMY      Family History  Problem Relation Age of Onset  . Hearing loss Father   . CVA Maternal Grandmother   . Arthritis Paternal Grandmother   . Hearing loss Paternal Grandmother     Social History   Tobacco Use  . Smoking status: Former Smoker    Quit date: 06/19/2011    Years since quitting: 8.2  . Smokeless tobacco: Never Used  Substance Use Topics  . Alcohol use: Yes    Comment: rare  . Drug use: No    Review of Systems:   ROS  Objective:   There were no vitals taken for this visit. There is no height or weight on file to calculate BMI.   General Appearance:    Alert, cooperative, no distress, appears stated age  Head:    Normocephalic, without obvious abnormality, atraumatic  Eyes:    PERRL,  conjunctiva/corneas clear, EOM's intact, fundi    benign, both eyes  Ears:    Normal TM's and external ear canals, both ears  Nose:   Nares normal, septum midline, mucosa normal, no drainage    or sinus tenderness  Throat:   Lips, mucosa, and tongue normal; teeth and gums normal  Neck:   Supple, symmetrical, trachea midline, no adenopathy;    thyroid:  no enlargement/tenderness/nodules; no carotid   bruit or JVD  Back:     Symmetric, no curvature, ROM normal, no CVA tenderness  Lungs:     Clear to auscultation bilaterally, respirations unlabored  Chest Wall:    No tenderness or deformity   Heart:    Regular rate and rhythm, S1 and S2 normal, no murmur, rub or gallop  Breast Exam:    No tenderness, masses, or nipple abnormality  Abdomen:     Soft, non-tender, bowel sounds active all  four quadrants,    no masses, no organomegaly  Genitalia:    Normal female without lesion, discharge or tenderness  Extremities:   Extremities normal, atraumatic, no cyanosis or edema  Pulses:   2+ and symmetric all extremities  Skin:   Skin color, texture, turgor normal, no rashes or lesions  Lymph nodes:   Cervical, supraclavicular, and axillary nodes normal  Neurologic:   CNII-XII intact, normal strength, sensation and reflexes    throughout   Results for orders placed or performed in visit on 08/02/19  Vitamin B12  Result Value Ref Range   Vitamin B-12 1,072 (H) 211 - 911 pg/mL  Comprehensive metabolic panel  Result Value Ref Range   Sodium 137 135 - 145 mEq/L   Potassium 4.4 3.5 - 5.1 mEq/L   Chloride 97 96 - 112 mEq/L   CO2 31 19 - 32 mEq/L   Glucose, Bld 99 70 - 99 mg/dL   BUN 12 6 - 23 mg/dL   Creatinine, Ser 0.89 0.40 - 1.20 mg/dL   Total Bilirubin 0.3 0.2 - 1.2 mg/dL   Alkaline Phosphatase 76 39 - 117 U/L   AST 15 0 - 37 U/L   ALT 19 0 - 35 U/L   Total Protein 7.0 6.0 - 8.3 g/dL   Albumin 3.8 3.5 - 5.2 g/dL   Calcium 9.5 8.4 - 10.5 mg/dL   GFR 68.97 >60.00 mL/min  CBC  Result Value Ref Range   WBC 8.5 4.0 - 10.5 K/uL   RBC 4.32 3.87 - 5.11 Mil/uL   Platelets 369.0 150.0 - 400.0 K/uL   Hemoglobin 12.2 12.0 - 15.0 g/dL   HCT 37.3 36.0 - 46.0 %   MCV 86.2 78.0 - 100.0 fl   MCHC 32.7 30.0 - 36.0 g/dL   RDW 16.8 (H) 11.5 - 15.5 %  Hemoglobin A1c  Result Value Ref Range   Hgb A1c MFr Bld 6.2 4.6 - 6.5 %    Assessment/Plan:   There are no diagnoses linked to this encounter.   Well Adult Exam: Labs ordered: {Yes/No:18319::"Yes"}. Patient counseling was done. See below for items discussed. Discussed the patient's BMI.  The {BMI plan (MU NQF measure 421):19504} Follow up {follow-up interval:13814}. Breast cancer screening: ***. Cervical cancer screening: ***   Patient Counseling: [x]    Nutrition: Stressed importance of moderation in sodium/caffeine intake,  saturated fat and cholesterol, caloric balance, sufficient intake of fresh fruits, vegetables, fiber, calcium, iron, and 1 mg of folate supplement per day (for females capable of pregnancy).  [x]    Stressed the importance of regular  exercise.   [x]    Substance Abuse: Discussed cessation/primary prevention of tobacco, alcohol, or other drug use; driving or other dangerous activities under the influence; availability of treatment for abuse.   [x]    Injury prevention: Discussed safety belts, safety helmets, smoke detector, smoking near bedding or upholstery.   [x]    Sexuality: Discussed sexually transmitted diseases, partner selection, use of condoms, avoidance of unintended pregnancy  and contraceptive alternatives.  [x]    Dental health: Discussed importance of regular tooth brushing, flossing, and dental visits.  [x]    Health maintenance and immunizations reviewed. Please refer to Health maintenance section.   ***  Inda Coke, PA-C [place]

## 2019-10-04 NOTE — Progress Notes (Signed)
Crystal Bean is a 43 y.o. female is here to follow up.  I acted as a Education administrator for [employer], PA-C Anselmo Pickler, LPN  History of Present Illness:   Chief Complaint  Patient presents with  . Obesity  . Back Pain    HPI   Obesity Pt following up today on weight loss. Pt has been on Ozempic for 7 weeks and has finished. Pt would like to discuss alternative due to cost of Ozempic.  She states that while she was on Ozempic she did not lose any weight, did not feel like it was helpful for her regards to her appetite.  Denies any unpleasant side effects with this medication and did not mind doing injections.  She is on Remeron, however she sees psych for this.  Back pain Pt c/o lower back pain radiating down legs.  She reports that her husband is on diclofenac and has found this to be helpful.  She would like to trial this medication.  She does have an allergy to meloxicam, did cause a rash.  Denies: Urinary issues, bowel or bladder incontinence, numbness and tingling down legs.  Bipolar disorder Patient reports that she recently went on vacation and since she has been back, her mood has been down.  She denies suicidal thoughts.  She is currently on Adderall XR 10 mg, Abilify 10 mg, Klonopin 1 mg twice daily as needed Remeron, 30 mg daily, Effexor to 25 mg daily.  She states that she needs to make an appointment with her psychiatrist soon, however she is wondering if she can see a new psychiatrist.  She sees her therapist weekly.  Health Maintenance Due  Topic Date Due  . PAP SMEAR-Modifier  10/14/1996    Past Medical History:  Diagnosis Date  . Anxiety   . B12 deficiency   . Bipolar 1 disorder ([place])   . Bulging lumbar disc   . C. difficile diarrhea 04/24/2016   treated with metronidazole  . Depression   . Depression   . GERD (gastroesophageal reflux disease)   . History of chicken pox   . History of shingles 2013  . Hypertension   . Migraines   . Osteoarthritis   .  PTSD (post-traumatic stress disorder)      Social History   Socioeconomic History  . Marital status: Married    Spouse name: Not on file  . Number of children: Not on file  . Years of education: Not on file  . Highest education level: Not on file  Occupational History  . Not on file  Social Needs  . Financial resource strain: Not on file  . Food insecurity    Worry: Not on file    Inability: Not on file  . Transportation needs    Medical: Not on file    Non-medical: Not on file  Tobacco Use  . Smoking status: Former Smoker    Quit date: 06/19/2011    Years since quitting: 8.2  . Smokeless tobacco: Never Used  Substance and Sexual Activity  . Alcohol use: Yes    Comment: rare  . Drug use: No  . Sexual activity: Yes    Birth control/protection: Pill  Lifestyle  . Physical activity    Days per week: Not on file    Minutes per session: Not on file  . Stress: Not on file  Relationships  . Social Herbalist on phone: Not on file    Gets together: Not on  file    Attends religious service: Not on file    Active member of club or organization: Not on file    Attends meetings of clubs or organizations: Not on file    Relationship status: Not on file  . Intimate partner violence    Fear of current or ex partner: Not on file    Emotionally abused: Not on file    Physically abused: Not on file    Forced sexual activity: Not on file  Other Topics Concern  . 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 BLADDER SURGERY  1981   Urethera stretched  . caps on teeth  1980   Caps on all Teeth  . CESAREAN SECTION  2003  . TONSILLECTOMY      Family History  Problem  Relation Age of Onset  . Hearing loss Father   . CVA Maternal Grandmother   . Arthritis Paternal Grandmother   . Hearing loss Paternal Grandmother     PMHx, SurgHx, SocialHx, FamHx, Medications, and Allergies were reviewed in the Visit Navigator and updated as appropriate.   Patient Active Problem List   Diagnosis Date Noted  . Obsessive compulsive disorder 09/08/2018  . Bipolar 1 disorder ([place]) 08/26/2018  . Obesity 04/08/2018  . Essential hypertension 04/08/2018  . PTSD (post-traumatic stress disorder)   . Osteoarthritis   . Migraines   . GERD (gastroesophageal reflux disease)   . 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 Bulging lumbar disc     Social History   Tobacco Use  . Smoking status: Former Smoker    Quit date: 06/19/2011    Years since quitting: 8.2  . Smokeless tobacco: Never Used  Substance Use Topics  . Alcohol use: Yes    Comment: rare  . Drug use: No    Current Medications and Allergies:    Current Outpatient Medications:  .  amphetamine-dextroamphetamine (ADDERALL XR) 10 MG 24 hr capsule, Take 10 mg by mouth daily., Disp: , Rfl:  .  ARIPiprazole (ABILIFY) 10 MG tablet, Take 10 mg by mouth daily., Disp: , Rfl:  .  aspirin 81 MG chewable tablet, Chew by mouth daily., Disp: , Rfl:  .  clonazePAM (KLONOPIN) 1 MG tablet, Take 1 mg by mouth 2 (two) times daily as needed for anxiety., Disp: , Rfl:  .  gabapentin (NEURONTIN) 100 MG capsule, TAKE ONE CAPSULE BY MOUTH DAILY. MAY INCREASE AS TOLERATED TO TAKE TAKE THREE CAPSULES BY MOUTH DAILY (Patient taking differently: Pt taking 2 capsules in the AM and one capsule in the PM.), Disp: 90 capsule, Rfl: 0 .  hydrochlorothiazide (MICROZIDE) 12.5 MG capsule, Take 1 capsule by mouth once daily, Disp: 90 capsule, Rfl: 0 .  losartan (COZAAR) 50 MG tablet, Take 1 tablet by mouth once daily, Disp: 90 tablet, Rfl: 0 .  mirtazapine (REMERON) 30 MG tablet, Take 30 mg by mouth at bedtime.,  Disp: , Rfl:  .  TRI-SPRINTEC 0.18/0.215/0.25 MG-35 MCG tablet, TAKE 1 TABLET BY MOUTH ONCE DAILY, Disp: 84 tablet, Rfl: 3 .  venlafaxine XR (EFFEXOR-XR) 150 MG 24 hr capsule, Take  150 mg by mouth daily with breakfast., Disp: , Rfl:  .  venlafaxine XR (EFFEXOR-XR) 75 MG 24 hr capsule, Take 75 mg by mouth daily with breakfast., Disp: , Rfl:  .  cyanocobalamin (,VITAMIN B-12,) 1000 MCG/ML injection, Inject 1 mL (1,000 mcg total) into the muscle every 30 (thirty) days. (Patient not taking: Reported on 10/04/2019), Disp: 3 mL, Rfl: 3 .  diclofenac (VOLTAREN) 75 MG EC tablet, Take 1 tablet (75 mg total) by mouth 2 (two) times daily., Disp: 60 tablet, Rfl: 0 .  metFORMIN (GLUCOPHAGE XR) 500 MG 24 hr tablet, Take 1 tablet (500 mg total) by mouth daily with breakfast., Disp: 30 tablet, Rfl: 0   Allergies  Allergen Reactions  . Meloxicam   . Penicillins     Review of Systems   ROS  Negative unless otherwise specified per HPI.   Vitals:   Vitals:   10/04/19 0807  BP: 110/78  Pulse: 80  Temp: (!) 97.3 F (36.3 C)  TempSrc: Temporal  SpO2: 95%  Weight: 273 lb (123.8 kg)  Height: 5\' 6"  (1.676 m)     Body mass index is 44.06 kg/m.   Physical Exam:    Physical Exam Vitals signs and nursing note reviewed.  Constitutional:      General: She is not in acute distress.    Appearance: She is well-developed. She is not ill-appearing or toxic-appearing.  Cardiovascular:     Rate and Rhythm: Normal rate and regular rhythm.     Pulses: Normal pulses.     Heart sounds: Normal heart sounds, S1 normal and S2 normal.     Comments: No LE edema Pulmonary:     Effort: Pulmonary effort is normal.     Breath sounds: Normal breath sounds.  Skin:    General: Skin is warm and dry.  Neurological:     Mental Status: She is alert.     GCS: GCS eye subscore is 4. GCS verbal subscore is 5. GCS motor subscore is 6.  Psychiatric:        Speech: Speech normal.        Behavior: Behavior normal.  Behavior is cooperative.      Assessment and Plan:    Crystal Bean was seen today for obesity and back pain.  Diagnoses and all orders for this visit:  Obesity, unspecified classification, unspecified obesity type, unspecified whether serious comorbidity present She is interested in possibly trialing a stimulant, such as phentermine, however I am reluctant to trial this at this time.  We are going to start metformin 500 mg daily.  This will help with insulin resistance as well.  We will have her follow-up with this at her convenience, we need to do a welcome to Medicare visit, so we can check an EKG and other labs at that time.  Lumbar back pain Will trial diclofenac tablet.  If she does not have any improvement of her symptoms, we will send to Ortho.  Patient verbalized understanding to plan.  She is already on Prilosec, recommend that she continue taking this.  Bipolar disorder  I am going to put in a referral for her to see a new psychiatrist per her request.  I recommended that she reach out to her psychiatrist ASAP in the interim in order to potentially change her medication regimen given her current mood.  She denies any suicidal thoughts. I discussed with patient that if they develop any SI, to tell someone immediately and seek medical attention.  other orders -  diclofenac (VOLTAREN) 75 MG EC tablet; Take 1 tablet (75 mg total) by mouth 2 (two) times daily. -     metFORMIN (GLUCOPHAGE XR) 500 MG 24 hr tablet; Take 1 tablet (500 mg total) by mouth daily with breakfast.  . Reviewed expectations re: course of current medical issues. . Discussed self-management of symptoms. . Outlined signs and symptoms indicating need for more acute intervention. . Patient verbalized understanding and all questions were answered. . See orders for this visit as documented in the electronic medical record. . Patient received an After Visit Summary.  CMA or LPN served as scribe during this visit. History,  Physical, and Plan performed by medical provider. The above documentation has been reviewed and is accurate and complete.   Inda Coke, PA-C [place], [place] 10/04/2019  Follow-up: No follow-ups on file.

## 2019-10-18 ENCOUNTER — Encounter: Payer: Self-pay | Admitting: Physician Assistant

## 2019-10-18 ENCOUNTER — Ambulatory Visit (INDEPENDENT_AMBULATORY_CARE_PROVIDER_SITE_OTHER): Payer: Medicare Other | Admitting: Physician Assistant

## 2019-10-18 ENCOUNTER — Other Ambulatory Visit (HOSPITAL_COMMUNITY)
Admission: RE | Admit: 2019-10-18 | Discharge: 2019-10-18 | Disposition: A | Discharge status: Home / Self Care | Payer: Medicare Other | Source: Ambulatory Visit | Attending: Physician Assistant | Admitting: Physician Assistant

## 2019-10-18 ENCOUNTER — Other Ambulatory Visit: Payer: Self-pay

## 2019-10-18 VITALS — BP 120/80 | HR 73 | Temp 98.1°F | Ht 66.0 in | Wt 282.0 lb

## 2019-10-18 DIAGNOSIS — E669 Obesity, unspecified: Secondary | ICD-10-CM

## 2019-10-18 DIAGNOSIS — I10 Essential (primary) hypertension: Secondary | ICD-10-CM | POA: Diagnosis not present

## 2019-10-18 DIAGNOSIS — Z136 Encounter for screening for cardiovascular disorders: Secondary | ICD-10-CM | POA: Diagnosis not present

## 2019-10-18 DIAGNOSIS — Z124 Encounter for screening for malignant neoplasm of cervix: Secondary | ICD-10-CM | POA: Diagnosis present

## 2019-10-18 DIAGNOSIS — Z1231 Encounter for screening mammogram for malignant neoplasm of breast: Secondary | ICD-10-CM | POA: Diagnosis not present

## 2019-10-18 DIAGNOSIS — R7303 Prediabetes: Secondary | ICD-10-CM

## 2019-10-18 DIAGNOSIS — Z0000 Encounter for general adult medical examination without abnormal findings: Secondary | ICD-10-CM

## 2019-10-18 DIAGNOSIS — Z13220 Encounter for screening for lipoid disorders: Secondary | ICD-10-CM | POA: Diagnosis not present

## 2019-10-18 DIAGNOSIS — Z1151 Encounter for screening for human papillomavirus (HPV): Secondary | ICD-10-CM | POA: Insufficient documentation

## 2019-10-18 LAB — COMPREHENSIVE METABOLIC PANEL
ALT: 25 U/L (ref 0–35)
AST: 17 U/L (ref 0–37)
Albumin: 3.4 g/dL — ABNORMAL LOW (ref 3.5–5.2)
Alkaline Phosphatase: 70 U/L (ref 39–117)
BUN: 14 mg/dL (ref 6–23)
CO2: 29 mEq/L (ref 19–32)
Calcium: 8.9 mg/dL (ref 8.4–10.5)
Chloride: 100 mEq/L (ref 96–112)
Creatinine, Ser: 0.82 mg/dL (ref 0.40–1.20)
GFR: 75.73 mL/min (ref >60.00)
Glucose, Bld: 104 mg/dL — ABNORMAL HIGH (ref 70–99)
Potassium: 3.8 mEq/L (ref 3.5–5.1)
Sodium: 137 mEq/L (ref 135–145)
Total Bilirubin: 0.3 mg/dL (ref 0.2–1.2)
Total Protein: 6.9 g/dL (ref 6.0–8.3)

## 2019-10-18 LAB — CBC WITH DIFFERENTIAL/PLATELET
Basophils Absolute: 0.1 10*3/uL (ref 0.0–0.1)
Basophils Relative: 0.9 % (ref 0.0–3.0)
Eosinophils Absolute: 0.1 10*3/uL (ref 0.0–0.7)
Eosinophils Relative: 1.6 % (ref 0.0–5.0)
HCT: 36.3 % (ref 36.0–46.0)
Hemoglobin: 11.9 g/dL — ABNORMAL LOW (ref 12.0–15.0)
Lymphocytes Relative: 22.5 % (ref 12.0–46.0)
Lymphs Abs: 2 10*3/uL (ref 0.7–4.0)
MCHC: 32.7 g/dL (ref 30.0–36.0)
MCV: 86.5 fl (ref 78.0–100.0)
Monocytes Absolute: 0.4 10*3/uL (ref 0.1–1.0)
Monocytes Relative: 5 % (ref 3.0–12.0)
Neutro Abs: 6.1 10*3/uL (ref 1.4–7.7)
Neutrophils Relative %: 70 % (ref 43.0–77.0)
Platelets: 395 10*3/uL (ref 150.0–400.0)
RBC: 4.2 Mil/uL (ref 3.87–5.11)
RDW: 17 % — ABNORMAL HIGH (ref 11.5–15.5)
WBC: 8.7 10*3/uL (ref 4.0–10.5)

## 2019-10-18 LAB — LIPID PANEL
Cholesterol: 191 mg/dL (ref 0–200)
HDL: 44.6 mg/dL (ref >39.00)
NonHDL: 146.11
Total CHOL/HDL Ratio: 4
Triglycerides: 227 mg/dL — ABNORMAL HIGH (ref 0.0–149.0)
VLDL: 45.4 mg/dL — ABNORMAL HIGH (ref 0.0–40.0)

## 2019-10-18 LAB — HEMOGLOBIN A1C: Hgb A1c MFr Bld: 6 % (ref 4.6–6.5)

## 2019-10-18 LAB — LDL CHOLESTEROL, DIRECT: Direct LDL: 111 mg/dL

## 2019-10-18 NOTE — Patient Instructions (Addendum)
It was great to see you!  Plan: 1. Start saxenda -- 0.6 mg daily x 1 month 2. Please get your mammogram 3. Follow-up with me in 3 weeks to see how you are doing on saxenda, sooner if you have concerns.  Please go to the lab for blood work.   Our office will call you with your results unless you have chosen to receive results via MyChart.  If your blood work is normal we will follow-up each year for physicals and as scheduled for chronic medical problems.  If anything is abnormal we will treat accordingly and get you in for a follow-up.  Take care,  [facility] Maintenance, Female Adopting a healthy lifestyle and getting preventive care are important in promoting health and wellness. Ask your health care provider about:  The right schedule for you to have regular tests and exams.  Things you can do on your own to prevent diseases and keep yourself healthy. What should I know about diet, weight, and exercise? Eat a healthy diet   Eat a diet that includes plenty of vegetables, fruits, low-fat dairy products, and lean protein.  Do not eat a lot of foods that are high in solid fats, added sugars, or sodium. Maintain a healthy weight Body mass index (BMI) is used to identify weight problems. It estimates body fat based on height and weight. Your health care provider can help determine your BMI and help you achieve or maintain a healthy weight. Get regular exercise Get regular exercise. This is one of the most important things you can do for your health. Most adults should:  Exercise for at least 150 minutes each week. The exercise should increase your heart rate and make you sweat (moderate-intensity exercise).  Do strengthening exercises at least twice a week. This is in addition to the moderate-intensity exercise.  Spend less time sitting. Even light physical activity can be beneficial. Watch cholesterol and blood lipids Have your blood tested for lipids and  cholesterol at 44 years of age, then have this test every 5 years. Have your cholesterol levels checked more often if:  Your lipid or cholesterol levels are high.  You are older than 44 years of age.  You are at high risk for heart disease. What should I know about cancer screening? Depending on your health history and family history, you may need to have cancer screening at various ages. This may include screening for:  Breast cancer.  Cervical cancer.  Colorectal cancer.  Skin cancer.  Lung cancer. What should I know about heart disease, diabetes, and high blood pressure? Blood pressure and heart disease  High blood pressure causes heart disease and increases the risk of stroke. This is more likely to develop in people who have high blood pressure readings, are of African descent, or are overweight.  Have your blood pressure checked: ? Every 3-5 years if you are 34-36 years of age. ? Every year if you are 24 years old or older. Diabetes Have regular diabetes screenings. This checks your fasting blood sugar level. Have the screening done:  Once every three years after age 6 if you are at a normal weight and have a low risk for diabetes.  More often and at a younger age if you are overweight or have a high risk for diabetes. What should I know about preventing infection? Hepatitis B If you have a higher risk for hepatitis B, you should be screened for this virus. Talk with your health  care provider to find out if you are at risk for hepatitis B infection. Hepatitis C Testing is recommended for:  Everyone born from [birth year] through 1966.  Anyone with known risk factors for hepatitis C. Sexually transmitted infections (STIs)  Get screened for STIs, including gonorrhea and chlamydia, if: ? You are sexually active and are younger than 44 years of age. ? You are older than 44 years of age and your health care provider tells you that you are at risk for this type of infection.  ? Your sexual activity has changed since you were last screened, and you are at increased risk for chlamydia or gonorrhea. Ask your health care provider if you are at risk.  Ask your health care provider about whether you are at high risk for HIV. Your health care provider may recommend a prescription medicine to help prevent HIV infection. If you choose to take medicine to prevent HIV, you should first get tested for HIV. You should then be tested every 3 months for as long as you are taking the medicine. Pregnancy  If you are about to stop having your period (premenopausal) and you may become pregnant, seek counseling before you get pregnant.  Take 400 to 800 micrograms (mcg) of folic acid every day if you become pregnant.  Ask for birth control (contraception) if you want to prevent pregnancy. Osteoporosis and menopause Osteoporosis is a disease in which the bones lose minerals and strength with aging. This can result in bone fractures. If you are 1 years old or older, or if you are at risk for osteoporosis and fractures, ask your health care provider if you should:  Be screened for bone loss.  Take a calcium or vitamin D supplement to lower your risk of fractures.  Be given hormone replacement therapy (HRT) to treat symptoms of menopause. Follow these instructions at home: Lifestyle  Do not use any products that contain nicotine or tobacco, such as cigarettes, e-cigarettes, and chewing tobacco. If you need help quitting, ask your health care provider.  Do not use street drugs.  Do not share needles.  Ask your health care provider for help if you need support or information about quitting drugs. Alcohol use  Do not drink alcohol if: ? Your health care provider tells you not to drink. ? You are pregnant, may be pregnant, or are planning to become pregnant.  If you drink alcohol: ? Limit how much you use to 0-1 drink a day. ? Limit intake if you are breastfeeding.  Be aware  of how much alcohol is in your drink. In the [place] one drink equals one 12 oz bottle of beer (355 mL), one 5 oz glass of wine (148 mL), or one 1 oz glass of hard liquor (44 mL). General instructions  Schedule regular health, dental, and eye exams.  Stay current with your vaccines.  Tell your health care provider if: ? You often feel depressed. ? You have ever been abused or do not feel safe at home. Summary  Adopting a healthy lifestyle and getting preventive care are important in promoting health and wellness.  Follow your health care provider's instructions about healthy diet, exercising, and getting tested or screened for diseases.  Follow your health care provider's instructions on monitoring your cholesterol and blood pressure. This information is not intended to replace advice given to you by your health care provider. Make sure you discuss any questions you have with your health care provider. Document Released: 05/20/2011 Document Revised: 10/28/2018  Document Reviewed: 10/28/2018 Elsevier Patient Education  [employer].

## 2019-10-18 NOTE — Progress Notes (Signed)
Phone: [phone]    I acted as a Education administrator for [employer], PA-C [employer], LPN Subjective:  Patient presents today for their Welcome to Medicare Exam    Preventive Screening-Counseling & Management  Vision screen: Passed  Hearing Screening   125Hz  250Hz  500Hz  1000Hz  2000Hz  3000Hz  4000Hz  6000Hz  8000Hz   Right ear:           Left ear:           Comments: Audiometry Rt passed Lt passed   Visual Acuity Screening   Right eye Left eye Both eyes  Without correction:     With correction: 20/20 20/20 20/20    Advanced directives: Discussed briefly, packet given to patient  Smoking Status: Former Smoker quit in 06/19/2011 Second Hand Smoking status: No smokers in home Alcohol Intake: none per week  Risk Factors Regular exercise: none currently Diet: difficulty controlling portions, managing carbohydrate intake  Mobility assessment: overall no issues Fall Risk: None  No flowsheet data found. Opioid use history: no long term opioids use  BMI monitoring- elevated BMI noted: Body mass index is 45.52 kg/m. Encouraged need for healthy eating, regular exercise, weight loss.   Obese  Cardiac risk factors:  Advanced age (older than 47 for men, 25 for women) -- does not apply Hyperlipidemia -- last lipid panel 3 years ago, LDL 86 No diabetes.  Family History: no cardiac per patient   Depression Screen None. PHQ2 = 1 Depression screen [facility] 2/9 10/18/2019 07/14/2018 04/08/2018  Decreased Interest 1 3 3   Down, Depressed, Hopeless 0 3 2  PHQ - 2 Score 1 6 5   Altered sleeping 1 3 2   Tired, decreased energy 1 3 3   Change in appetite 3 3 3   Feeling bad or failure about yourself  0 3 3  Trouble concentrating 3 3 3   Moving slowly or fidgety/restless 0 3 2  Suicidal thoughts 0 (No Data) 0  PHQ-9 Score 9 24 21   Difficult doing work/chores Very difficult Extremely dIfficult Extremely dIfficult   Activities of Daily Living Independent ADLs (toileting, bathing, dressing,  transferring, eating) and IADLs (shopping, housekeeping, managing own medications, and handling finances)  Hearing Difficulties: patient declines  Cognitive Testing             No reported trouble.   Mini cog: passed clock draw. 3/3 delayed recall. Normal test result   List the Names of Other Physician/Practitioners you currently use: -Izediuno, Laruth Bouchard, MD --> psychiatry -Penumalli, Earlean Polka, MD --> neurology -Steffanie Rainwater -> therapist  Immunization History  Administered Date(s) Administered   Influenza,inj,Quad PF,6+ Mos 07/08/2019   Tdap 02/12/2013   Required Immunizations needed today -- none  Screening tests- up to date Health Maintenance Due  Topic Date Due   PAP SMEAR-Modifier  10/14/1996   1. Colon cancer screening- NA, start at age 36 2. Lung Cancer screening- NA, consider at age 18 3. Skin cancer screening- declines 4.   Cervical cancer screening- obtained today 5.   Breast cancer screening- ordered today  Problem/HPI Obesity/insulin resistance We briefly discussed this at last visit and we started Metformin 500 mg XR daily.  She continues to gain weight, and feels like her appetite is out of control, but is uncertain if this is just because she feels like she has given up on trying to lose weight.  She is on Remeron 30 mg daily and this is doing very well in regards to her mood management, and she knows that this is a medication that can cause weight  gain.  ROS- No pertinent positives discovered in course of AWV  The following were reviewed and entered/updated in epic: Past Medical History:  Diagnosis Date   Anxiety    B12 deficiency    Bipolar 1 disorder ([place])    Bulging lumbar disc    C. difficile diarrhea 04/24/2016   treated with metronidazole   Depression    Depression    GERD (gastroesophageal reflux disease)    History of chicken pox    History of shingles 2013   Hypertension    Migraines    Osteoarthritis    PTSD  (post-traumatic stress disorder)    Patient Active Problem List   Diagnosis Date Noted   Obsessive compulsive disorder 09/08/2018   Bipolar 1 disorder ([place]) 08/26/2018   Obesity 04/08/2018   Essential hypertension 04/08/2018   PTSD (post-traumatic stress disorder)    Osteoarthritis    Migraines    GERD (gastroesophageal reflux disease)    Major depressive disorder, recurrent episode with mixed features ([place])    Bulging lumbar disc    Past Surgical History:  Procedure Laterality Date   BLADDER SURGERY  1981   Urethera stretched   caps on teeth  1980   Caps on all Teeth   CESAREAN SECTION  2003   TONSILLECTOMY      Family History  Problem Relation Age of Onset   Hearing loss Father    CVA Maternal Grandmother    Arthritis Paternal Grandmother    Hearing loss Paternal Grandmother     Medications- reviewed and updated Current Outpatient Medications  Medication Sig Dispense Refill   amphetamine-dextroamphetamine (ADDERALL XR) 10 MG 24 hr capsule Take 10 mg by mouth daily.     ARIPiprazole (ABILIFY) 10 MG tablet Take 10 mg by mouth daily.     aspirin 81 MG chewable tablet Chew by mouth daily.     clonazePAM (KLONOPIN) 1 MG tablet Take 1 mg by mouth 2 (two) times daily as needed for anxiety.     diclofenac (VOLTAREN) 75 MG EC tablet Take 1 tablet (75 mg total) by mouth 2 (two) times daily. 60 tablet 0   gabapentin (NEURONTIN) 100 MG capsule TAKE ONE CAPSULE BY MOUTH DAILY. MAY INCREASE AS TOLERATED TO TAKE TAKE THREE CAPSULES BY MOUTH DAILY (Patient taking differently: Pt taking 2 capsules in the AM and one capsule in the PM.) 90 capsule 0   hydrochlorothiazide (MICROZIDE) 12.5 MG capsule Take 1 capsule by mouth once daily 90 capsule 0   losartan (COZAAR) 50 MG tablet Take 1 tablet by mouth once daily 90 tablet 0   metFORMIN (GLUCOPHAGE XR) 500 MG 24 hr tablet Take 1 tablet (500 mg total) by mouth daily with breakfast. 30 tablet 0   mirtazapine  (REMERON) 30 MG tablet Take 30 mg by mouth at bedtime.     TRI-SPRINTEC 0.18/0.215/0.25 MG-35 MCG tablet TAKE 1 TABLET BY MOUTH ONCE DAILY 84 tablet 3   venlafaxine XR (EFFEXOR-XR) 150 MG 24 hr capsule Take 150 mg by mouth daily with breakfast.     venlafaxine XR (EFFEXOR-XR) 75 MG 24 hr capsule Take 75 mg by mouth daily with breakfast.     cyanocobalamin (,VITAMIN B-12,) 1000 MCG/ML injection Inject 1 mL (1,000 mcg total) into the muscle every 30 (thirty) days. (Patient not taking: Reported on 10/04/2019) 3 mL 3   No current facility-administered medications for this visit.     Allergies-reviewed and updated Allergies  Allergen Reactions   Meloxicam    Penicillins  Social History   Socioeconomic History   Marital status: Married    Spouse name: Not on file   Number of children: Not on file   Years of education: Not on file   Highest education level: Not on file  Occupational History   Not on file  Social Needs   Financial resource strain: Not on file   Food insecurity    Worry: Not on file    Inability: Not on file   Transportation needs    Medical: Not on file    Non-medical: Not on file  Tobacco Use   Smoking status: Former Smoker    Quit date: 06/19/2011    Years since quitting: 8.3   Smokeless tobacco: Never Used  Substance and Sexual Activity   Alcohol use: Yes    Comment: rare   Drug use: No   Sexual activity: Yes    Birth control/protection: Pill  Lifestyle   Physical activity    Days per week: Not on file    Minutes per session: Not on file   Stress: Not on file  Relationships   Social connections    Talks on phone: Not on file    Gets together: Not on file    Attends religious service: Not on file    Active member of club or organization: Not on file    Attends meetings of clubs or organizations: Not on file    Relationship status: Not on file  Other Topics Concern   Not on file  Social History Narrative   29 y/o daughter  [name])   Married for 13 years (husband is Shanon Brow)   [employer]; has degree in marketing, 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 Oropharynx normal Neck: no thyromegaly CV: RRR no murmurs rubs or gallops Lungs: CTAB no crackles, wheeze, rhonchi Abdomen: soft/nontender/nondistended/normal bowel sounds. No rebound or guarding.  Ext: no edema Skin: warm, dry Neuro: grossly normal, moves all extremities, PERRLA GU: Vulva and vagina appear normal, Pap obtained   Assessment and Plan:   Welcome to Medicare exam completed- discussed recommended screenings and documented any personalized health advice and referrals for preventive counseling. See AVS as well which was given to patient.   Status of chronic or acute concerns  1. Welcome to Medicare preventive visit Today patient counseled on age appropriate routine health concerns for screening and prevention, each reviewed and up to date or declined. Immunizations reviewed and up to date or declined. Labs ordered and reviewed. Risk factors for depression reviewed and negative. Hearing function and visual acuity are intact. ADLs screened and addressed as needed. Functional ability and level of safety reviewed and appropriate. Education, counseling and referrals performed based on assessed risks today. Patient provided with a copy of personalized plan for preventive services.  2. Essential hypertension  Currently well controlled on Losartan 50 mg and HCTZ 12.5 mg. - Plan: CBC with Differential/Platelet, Comprehensive metabolic panel  3.  Pre-diabetes Will update HgbA1c (this is the first HgbA1c that she will have insurance.)  Continue Metformin 500 mg XR daily.  - Plan: Hemoglobin A1c  4. Pap smear for cervical cancer screening - Plan: Cytology - PAP  5. Encounter for lipid screening for cardiovascular disease We will update ASCVD and make a plan from there.- Plan: Lipid panel  6. Breast cancer screening by mammogram Ordered and counseled.- Plan: MM DIGITAL SCREENING BILATERAL  7. Obesity, unspecified classification, unspecified obesity type, unspecified whether serious comorbidity present Discussed starting Saxenda, patient is agreeable to this.  We will start 0.6 mg Saxenda daily and I recommended that she follow-up with me in 3 weeks to discuss how its going on this medication.  Recommended follow up: 3 weeks  Future Appointments  Date Time Provider [place]  11/09/2019  8:00 AM Inda Coke, PA [facility]     Lab/Order associations:   ICD-10-CM   1. Welcome to Medicare preventive visit  Z00.00   2. Essential hypertension  I10 CBC with Differential/Platelet    Comprehensive metabolic panel  3. Pre-diabetes  R73.03 Hemoglobin A1c  4. Pap smear for cervical cancer screening  Z12.4 Cytology - PAP  5. Encounter for lipid screening for cardiovascular disease  Z13.220 Lipid panel   Z13.6   6. Breast cancer screening by mammogram  Z12.31 MM DIGITAL SCREENING BILATERAL  7. Obesity, unspecified classification, unspecified obesity type, unspecified whether serious comorbidity present  E66.9     No orders of the defined types were placed in this encounter.  CMA or LPN served as scribe during this visit. History, Physical, and Plan performed by medical provider. The above documentation has been reviewed and is accurate and complete.  Return precautions advised. Inda Coke, [place]

## 2019-10-22 LAB — CYTOLOGY - PAP
Comment: NEGATIVE
Diagnosis: NEGATIVE
High risk HPV: NEGATIVE

## 2019-10-26 ENCOUNTER — Other Ambulatory Visit: Payer: Self-pay | Admitting: Physician Assistant

## 2019-10-31 ENCOUNTER — Other Ambulatory Visit: Payer: Self-pay | Admitting: Physician Assistant

## 2019-11-08 ENCOUNTER — Other Ambulatory Visit: Payer: Self-pay

## 2019-11-08 NOTE — Progress Notes (Signed)
Virtual Visit via Video   I connected with Crystal Bean on 11/09/19 at  8:00 AM EST by a video enabled telemedicine application and verified that I am speaking with the correct person using two identifiers. Location patient: Home Location provider: [facility], Office Persons participating in the virtual visit: Crystal Bean, [place] PA-C   I discussed the limitations of evaluation and management by telemedicine and the availability of in person appointments. The patient expressed understanding and agreed to proceed.  Subjective:   HPI:   Obesity Patient was given a sample of Saxenda to trial at her last visit at the end of Nov. She is here to follow-up on this. She was told to start 0.6mg  Saxenda daily. She has been taking this as directed. She is down about 11 lb since staring Saxenda. She is tolerating well. She denies any unpleasant GI side effects. She has no issues with administering the medication. She has not had a soda in 2 weeks! She is trying to get back into exercise.   Wt Readings from Last 10 Encounters:  11/09/19 271 lb (122.9 kg)  10/18/19 282 lb (127.9 kg)  10/04/19 273 lb (123.8 kg)  08/02/19 274 lb 4 oz (124.4 kg)  12/23/18 275 lb 4 oz (124.9 kg)  10/30/18 274 lb (124.3 kg)  09/17/18 256 lb 12.8 oz (116.5 kg)  08/26/18 252 lb 4 oz (114.4 kg)  07/14/18 249 lb 12.8 oz (113.3 kg)  05/22/18 241 lb 8 oz (109.5 kg)     ROS: See pertinent positives and negatives per HPI.  Patient Active Problem List   Diagnosis Date Noted  . Obsessive compulsive disorder 09/08/2018  . Bipolar 1 disorder ([place]) 08/26/2018  . Obesity 04/08/2018  . Essential hypertension 04/08/2018  . PTSD (post-traumatic stress disorder)   . Osteoarthritis   . Migraines   . GERD (gastroesophageal reflux disease)   . Major depressive disorder, recurrent episode with mixed features ([place])   . Bulging lumbar disc     Social History   Tobacco Use  . Smoking status: Former Smoker   Quit date: 06/19/2011    Years since quitting: 8.3  . Smokeless tobacco: Never Used  Substance Use Topics  . Alcohol use: Yes    Comment: rare    Current Outpatient Medications:  .  amphetamine-dextroamphetamine (ADDERALL XR) 10 MG 24 hr capsule, Take 10 mg by mouth daily., Disp: , Rfl:  .  ARIPiprazole (ABILIFY) 10 MG tablet, Take 10 mg by mouth daily., Disp: , Rfl:  .  aspirin 81 MG chewable tablet, Chew by mouth daily., Disp: , Rfl:  .  clonazePAM (KLONOPIN) 1 MG tablet, Take 1 mg by mouth 2 (two) times daily as needed for anxiety., Disp: , Rfl:  .  cyanocobalamin (,VITAMIN B-12,) 1000 MCG/ML injection, Inject 1 mL (1,000 mcg total) into the muscle every 30 (thirty) days. (Patient not taking: Reported on 10/04/2019), Disp: 3 mL, Rfl: 3 .  diclofenac (VOLTAREN) 75 MG EC tablet, Take 1 tablet (75 mg total) by mouth 2 (two) times daily., Disp: 60 tablet, Rfl: 0 .  gabapentin (NEURONTIN) 100 MG capsule, TAKE ONE CAPSULE BY MOUTH DAILY. MAY INCREASE AS TOLERATED TO TAKE THREE CAPSULES BY MOUTH DAILY, Disp: 90 capsule, Rfl: 2 .  hydrochlorothiazide (MICROZIDE) 12.5 MG capsule, Take 1 capsule by mouth once daily, Disp: 90 capsule, Rfl: 0 .  Liraglutide -Weight Management (SAXENDA) 18 MG/3ML SOPN, Inject 0.6 mg into the skin daily., Disp: 1 pen, Rfl: 3 .  losartan (COZAAR) 50 MG tablet, Take 1 tablet by mouth once daily, Disp: 90 tablet, Rfl: 0 .  metFORMIN (GLUCOPHAGE-XR) 500 MG 24 hr tablet, TAKE ONE TABLET BY MOUTH EVERY MORNING WITH BREAKFAST, Disp: 30 tablet, Rfl: 0 .  mirtazapine (REMERON) 30 MG tablet, Take 30 mg by mouth at bedtime., Disp: , Rfl:  .  TRI-SPRINTEC 0.18/0.215/0.25 MG-35 MCG tablet, TAKE 1 TABLET BY MOUTH ONCE DAILY, Disp: 84 tablet, Rfl: 3 .  venlafaxine XR (EFFEXOR-XR) 150 MG 24 hr capsule, Take 150 mg by mouth daily with breakfast., Disp: , Rfl:  .  venlafaxine XR (EFFEXOR-XR) 75 MG 24 hr capsule, Take 75 mg by mouth daily with breakfast., Disp: , Rfl:   Allergies    Allergen Reactions  . Meloxicam   . Penicillins     Objective:   VITALS: Per patient if applicable, see vitals. GENERAL: Alert, appears well and in no acute distress. HEENT: Atraumatic, conjunctiva clear, no obvious abnormalities on inspection of external nose and ears. NECK: Normal movements of the head and neck. CARDIOPULMONARY: No increased WOB. Speaking in clear sentences. I:E ratio WNL.  MS: Moves all visible extremities without noticeable abnormality. PSYCH: Pleasant and cooperative, well-groomed. Speech normal rate and rhythm. Affect is appropriate. Insight and judgement are appropriate. Attention is focused, linear, and appropriate.  NEURO: CN grossly intact. Oriented as arrived to appointment on time with no prompting. Moves both UE equally.  SKIN: No obvious lesions, wounds, erythema, or cyanosis noted on face or hands.  Assessment and Plan:   Diagnoses and all orders for this visit:  Obesity, unspecified classification, unspecified obesity type, unspecified whether serious comorbidity present  Other orders -     Liraglutide -Weight Management (SAXENDA) 18 MG/3ML SOPN; Inject 0.6 mg into the skin daily.   Responding well to [name]. I have sent this in and asked her to see how much it would cost her, may need to trial Victoza instead if unaffordable. Follow-up in 3 months, sooner if concerns.  . Reviewed expectations re: course of current medical issues. . Discussed self-management of symptoms. . Outlined signs and symptoms indicating need for more acute intervention. . Patient verbalized understanding and all questions were answered. Marland Kitchen Health Maintenance issues including appropriate healthy diet, exercise, and smoking avoidance were discussed with patient. . See orders for this visit as documented in the electronic medical record.  I discussed the assessment and treatment plan with the patient. The patient was provided an opportunity to ask questions and all were  answered. The patient agreed with the plan and demonstrated an understanding of the instructions.   The patient was advised to call back or seek an in-person evaluation if the symptoms worsen or if the condition fails to improve as anticipated.   CMA or LPN served as scribe during this visit. History, Physical, and Plan performed by medical provider. The above documentation has been reviewed and is accurate and complete.  [place], [place] 11/09/2019

## 2019-11-09 ENCOUNTER — Encounter: Payer: Self-pay | Admitting: Physician Assistant

## 2019-11-09 ENCOUNTER — Ambulatory Visit (INDEPENDENT_AMBULATORY_CARE_PROVIDER_SITE_OTHER): Payer: Medicare Other | Admitting: Physician Assistant

## 2019-11-09 ENCOUNTER — Other Ambulatory Visit: Payer: Self-pay | Admitting: Physician Assistant

## 2019-11-09 VITALS — Wt 271.0 lb

## 2019-11-09 DIAGNOSIS — E669 Obesity, unspecified: Secondary | ICD-10-CM | POA: Diagnosis not present

## 2019-11-09 MED ORDER — SAXENDA 18 MG/3ML ~~LOC~~ SOPN: 0.6000 mg | PEN_INJECTOR | Freq: Every day | SUBCUTANEOUS | 3 refills | Status: DC

## 2019-11-09 MED ORDER — LIRAGLUTIDE 18 MG/3ML ~~LOC~~ SOPN: PEN_INJECTOR | SUBCUTANEOUS | 3 refills | Status: DC

## 2019-11-11 ENCOUNTER — Other Ambulatory Visit: Payer: Self-pay | Admitting: Physician Assistant

## 2019-11-26 ENCOUNTER — Other Ambulatory Visit: Payer: Self-pay | Admitting: *Deleted

## 2019-11-26 ENCOUNTER — Encounter: Payer: Self-pay | Admitting: Physician Assistant

## 2019-11-26 DIAGNOSIS — F319 Bipolar disorder, unspecified: Secondary | ICD-10-CM

## 2019-11-26 DIAGNOSIS — F4310 Post-traumatic stress disorder, unspecified: Secondary | ICD-10-CM

## 2019-11-28 ENCOUNTER — Other Ambulatory Visit: Payer: Self-pay | Admitting: Physician Assistant

## 2019-12-03 NOTE — Telephone Encounter (Signed)
Colletta [place] can you look into this please.

## 2019-12-09 ENCOUNTER — Other Ambulatory Visit: Payer: Self-pay

## 2019-12-09 ENCOUNTER — Ambulatory Visit
Admission: RE | Admit: 2019-12-09 | Discharge: 2019-12-09 | Disposition: A | Discharge status: Home / Self Care | Payer: Medicare Other | Source: Ambulatory Visit | Attending: Physician Assistant | Admitting: Physician Assistant

## 2019-12-09 DIAGNOSIS — Z1231 Encounter for screening mammogram for malignant neoplasm of breast: Secondary | ICD-10-CM

## 2019-12-14 ENCOUNTER — Other Ambulatory Visit: Payer: Self-pay | Admitting: Physician Assistant

## 2019-12-16 ENCOUNTER — Other Ambulatory Visit: Payer: Self-pay

## 2019-12-17 ENCOUNTER — Ambulatory Visit (INDEPENDENT_AMBULATORY_CARE_PROVIDER_SITE_OTHER): Payer: Medicare Other | Admitting: Physician Assistant

## 2019-12-17 ENCOUNTER — Encounter: Payer: Self-pay | Admitting: Physician Assistant

## 2019-12-17 VITALS — BP 110/80 | HR 78 | Temp 97.3°F | Ht 66.0 in | Wt 273.2 lb

## 2019-12-17 DIAGNOSIS — D229 Melanocytic nevi, unspecified: Secondary | ICD-10-CM

## 2019-12-17 MED ORDER — DESONIDE 0.05 % EX CREA: TOPICAL_CREAM | Freq: Two times a day (BID) | CUTANEOUS | 0 refills | Status: DC

## 2019-12-17 MED ORDER — NYSTATIN-TRIAMCINOLONE 100000-0.1 UNIT/GM-% EX OINT: 1.0000 "application " | TOPICAL_OINTMENT | Freq: Two times a day (BID) | CUTANEOUS | 0 refills | Status: DC
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
## 2019-12-17 NOTE — Patient Instructions (Signed)
It was great to see you!  Use the desonide, small amount, twice daily. Follow-up if no improvement in 1 week.  Take care,  Inda Coke PA-C

## 2019-12-31 ENCOUNTER — Other Ambulatory Visit: Payer: Self-pay | Admitting: Physician Assistant

## 2020-01-17 ENCOUNTER — Other Ambulatory Visit: Payer: Self-pay | Admitting: Physician Assistant

## 2020-01-26 ENCOUNTER — Other Ambulatory Visit: Payer: Self-pay | Admitting: Physician Assistant

## 2020-02-04 ENCOUNTER — Ambulatory Visit: Payer: Medicare Other | Attending: Internal Medicine

## 2020-02-04 DIAGNOSIS — Z23 Encounter for immunization: Secondary | ICD-10-CM

## 2020-02-04 NOTE — Progress Notes (Signed)
   Covid-19 Vaccination Clinic  Name:  Crystal Bean    MRN: [id number] DOB: January 09, 1976  02/04/2020  Crystal Bean was observed post Covid-19 immunization for 15 minutes without incident. She was provided with Vaccine Information Sheet and instruction to access the V-Safe system.   Crystal Bean was instructed to call 911 with any severe reactions post vaccine: Marland Kitchen Difficulty breathing  . Swelling of face and throat  . A fast heartbeat  . A bad rash all over body  . Dizziness and weakness   Immunizations Administered    Name Date Dose VIS Date Route   Pfizer COVID-19 Vaccine 02/04/2020  9:29 AM 0.3 mL 10/29/2019 Intramuscular   Manufacturer: [place]   Lot: [id number]   [place]: [id number]

## 2020-02-07 ENCOUNTER — Encounter: Payer: Self-pay | Admitting: Physician Assistant

## 2020-02-07 ENCOUNTER — Ambulatory Visit (INDEPENDENT_AMBULATORY_CARE_PROVIDER_SITE_OTHER): Payer: Medicare Other | Admitting: Physician Assistant

## 2020-02-07 ENCOUNTER — Other Ambulatory Visit: Payer: Self-pay

## 2020-02-07 VITALS — BP 159/90 | HR 80 | Ht 67.0 in | Wt 269.0 lb

## 2020-02-07 DIAGNOSIS — F411 Generalized anxiety disorder: Secondary | ICD-10-CM | POA: Diagnosis not present

## 2020-02-07 DIAGNOSIS — F4310 Post-traumatic stress disorder, unspecified: Secondary | ICD-10-CM | POA: Diagnosis not present

## 2020-02-07 DIAGNOSIS — F319 Bipolar disorder, unspecified: Secondary | ICD-10-CM | POA: Diagnosis not present

## 2020-02-07 DIAGNOSIS — F902 Attention-deficit hyperactivity disorder, combined type: Secondary | ICD-10-CM | POA: Diagnosis not present

## 2020-02-07 MED ORDER — ALPRAZOLAM 1 MG PO TABS: 1.0000 mg | ORAL_TABLET | Freq: Two times a day (BID) | ORAL | 1 refills | Status: DC | PRN

## 2020-02-07 MED ORDER — VENLAFAXINE HCL ER 75 MG PO CP24: 225.0000 mg | ORAL_CAPSULE | Freq: Every day | ORAL | 1 refills | Status: DC
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
## 2020-02-20 ENCOUNTER — Other Ambulatory Visit: Payer: Self-pay | Admitting: Physician Assistant

## 2020-02-29 ENCOUNTER — Ambulatory Visit: Payer: Medicare Other | Attending: Internal Medicine

## 2020-02-29 DIAGNOSIS — Z23 Encounter for immunization: Secondary | ICD-10-CM

## 2020-02-29 NOTE — Progress Notes (Signed)
   Covid-19 Vaccination Clinic  Name:  JAMALA RESTIVO    MRN: [id number] DOB: 1976-06-14  02/29/2020  Ms. Tippens was observed post Covid-19 immunization for 15 minutes without incident. She was provided with Vaccine Information Sheet and instruction to access the V-Safe system.   Ms. Arroyo was instructed to call 911 with any severe reactions post vaccine: Marland Kitchen Difficulty breathing  . Swelling of face and throat  . A fast heartbeat  . A bad rash all over body  . Dizziness and weakness   Immunizations Administered    Name Date Dose VIS Date Route   Pfizer COVID-19 Vaccine 02/29/2020 10:29 AM 0.3 mL 10/29/2019 Intramuscular   Manufacturer: [place]   Lot: [id number]   [place]: [id number]

## 2020-03-09 ENCOUNTER — Telehealth: Payer: Self-pay | Admitting: Physician Assistant

## 2020-03-09 ENCOUNTER — Other Ambulatory Visit: Payer: Self-pay | Admitting: Physician Assistant

## 2020-03-09 MED ORDER — ARIPIPRAZOLE 10 MG PO TABS: 10.0000 mg | ORAL_TABLET | Freq: Every day | ORAL | 1 refills | Status: DC

## 2020-03-09 NOTE — Telephone Encounter (Signed)
I sent in the Abilify.  You're right.  She and I discussed the benzo and stimulant and she agreed that the benzo is more important right now.  So no Adderall.

## 2020-03-09 NOTE — Telephone Encounter (Signed)
Patient called and said that she would like a refill on her abilfy 10 mg that has not been prescribed before by teresa. She also wants a refill on her adderall 10 mg to be sent to the [facility] on [address].

## 2020-03-10 ENCOUNTER — Other Ambulatory Visit: Payer: Self-pay | Admitting: Physician Assistant

## 2020-03-10 ENCOUNTER — Other Ambulatory Visit: Payer: Self-pay

## 2020-03-10 MED ORDER — MIRTAZAPINE 30 MG PO TABS: 30.0000 mg | ORAL_TABLET | Freq: Every day | ORAL | 1 refills | Status: DC

## 2020-03-10 NOTE — Telephone Encounter (Signed)
She will have to bring and the bottle of Xanax that she got filled on 02/07/2020 for [place] to dispose of.  When she does that, can send in the Adderall.

## 2020-03-13 ENCOUNTER — Other Ambulatory Visit: Payer: Self-pay | Admitting: Physician Assistant

## 2020-03-13 MED ORDER — AMPHETAMINE-DEXTROAMPHET ER 10 MG PO CP24: 10.0000 mg | ORAL_CAPSULE | Freq: Every day | ORAL | 0 refills | Status: DC

## 2020-03-13 NOTE — Telephone Encounter (Signed)
I sent Rx in 

## 2020-03-20 ENCOUNTER — Other Ambulatory Visit: Payer: Self-pay

## 2020-03-20 ENCOUNTER — Encounter: Payer: Self-pay | Admitting: Physician Assistant

## 2020-03-20 ENCOUNTER — Ambulatory Visit (INDEPENDENT_AMBULATORY_CARE_PROVIDER_SITE_OTHER): Payer: Medicare Other | Admitting: Physician Assistant

## 2020-03-20 VITALS — BP 140/90 | HR 73

## 2020-03-20 DIAGNOSIS — F411 Generalized anxiety disorder: Secondary | ICD-10-CM

## 2020-03-20 DIAGNOSIS — F902 Attention-deficit hyperactivity disorder, combined type: Secondary | ICD-10-CM | POA: Diagnosis not present

## 2020-03-20 DIAGNOSIS — F319 Bipolar disorder, unspecified: Secondary | ICD-10-CM

## 2020-03-20 DIAGNOSIS — F339 Major depressive disorder, recurrent, unspecified: Secondary | ICD-10-CM | POA: Diagnosis not present

## 2020-03-20 DIAGNOSIS — F422 Mixed obsessional thoughts and acts: Secondary | ICD-10-CM | POA: Diagnosis not present

## 2020-03-20 DIAGNOSIS — F4310 Post-traumatic stress disorder, unspecified: Secondary | ICD-10-CM

## 2020-03-20 DIAGNOSIS — F19981 Other psychoactive substance use, unspecified with psychoactive substance-induced sexual dysfunction: Secondary | ICD-10-CM

## 2020-03-20 MED ORDER — BUPROPION HCL ER (XL) 150 MG PO TB24: 150.0000 mg | ORAL_TABLET | Freq: Every day | ORAL | 1 refills | Status: DC

## 2020-03-20 NOTE — Progress Notes (Addendum)
[facility]  Patient ID: Crystal Bean,  MRN: [id number]  PCP: Inda Coke, PA  Date of Evaluation: 03/20/2020 Time spent:30 minutes  Chief Complaint:  Chief Complaint    Anxiety; ADD; Depression      HISTORY/CURRENT STATUS: HPI For routine med check.  Initial visit was about 6 weeks ago, when we held the Adderall b/c she needed the BZD more. But she wasn't able to keep up w/ housework or anything w/o the stimulant.  She called and we restarted the Adderall.  She is doing much better as far as focus and motivation.  She still does have anxiety but most of the time she is able to distract herself by using tools that she is learned in therapy which are helpful.  She has on average 1 panic attack per week that is more difficult.  Then she will go in the bathroom or somewhere and cry and then she is over it for the most part.  She is sleeping well most of the time.  She is not working.  The biggest problem she has with the antidepressants is decreased libido.  This has been ongoing with each antidepressant she has ever used.  She did take Wellbutrin in the past but does not remember if it was partly for that reason or not.  She would be willing to try again if I think it would help.  Patient denies loss of interest in usual activities and is able to enjoy things.  Denies decreased energy or motivation.  Appetite has not changed.  No extreme sadness, tearfulness, or feelings of hopelessness.  Denies any changes in concentration, making decisions or remembering things.  Denies suicidal or homicidal thoughts.  Patient denies increased energy with decreased need for sleep, no increased talkativeness, no racing thoughts, no impulsivity or risky behaviors, no increased spending, no increased libido, no grandiosity, no increased irritability or anger, and no hallucinations.  Denies dizziness, syncope, seizures, numbness, tingling, tremor, tics, unsteady gait, slurred speech,  confusion. Denies muscle or joint pain, stiffness, or dystonia. Denies unexplained weight loss, frequent infections, or sores that heal slowly.  No polyphagia, polydipsia, or polyuria. Denies visual changes or paresthesias.   Individual Medical History/ Review of Systems: Changes? :No    Past medications for mental health diagnoses include: Prozac, Celexa, Lexapro, Wellbutrin, Effexor XR, Pristiq, Abilify,  Adderall, Ritalin, Klonopin, Xanax, Gabapentin, Buspar  Allergies: Meloxicam and Penicillins  Current Medications:  Current Outpatient Medications:  .  amphetamine-dextroamphetamine (ADDERALL XR) 10 MG 24 hr capsule, Take 1 capsule (10 mg total) by mouth daily., Disp: 30 capsule, Rfl: 0 .  ARIPiprazole (ABILIFY) 10 MG tablet, Take 1 tablet (10 mg total) by mouth daily., Disp: 30 tablet, Rfl: 1 .  aspirin 81 MG chewable tablet, Chew by mouth daily., Disp: , Rfl:  .  cyanocobalamin (,VITAMIN B-12,) 1000 MCG/ML injection, Inject 1 mL (1,000 mcg total) into the muscle every 30 (thirty) days., Disp: 3 mL, Rfl: 3 .  desonide (DESOWEN) 0.05 % cream, Apply topically 2 (two) times daily., Disp: 30 g, Rfl: 0 .  diclofenac (VOLTAREN) 75 MG EC tablet, TAKE ONE TABLET BY MOUTH TWICE A DAY, Disp: 60 tablet, Rfl: 0 .  esomeprazole (NEXIUM) 20 MG capsule, Take 20 mg by mouth daily at 12 noon., Disp: , Rfl:  .  gabapentin (NEURONTIN) 100 MG capsule, TAKE ONE CAPSULE BY MOUTH DAILY. MAY INCREASE AS TOLERATED TO TAKE THREE CAPSULES BY MOUTH DAILY (Patient taking differently: 100 mg 2 (two) times daily. ),  Disp: 90 capsule, Rfl: 1 .  hydrochlorothiazide (MICROZIDE) 12.5 MG capsule, Take 1 capsule by mouth once daily, Disp: 90 capsule, Rfl: 0 .  losartan (COZAAR) 50 MG tablet, Take 1 tablet by mouth once daily, Disp: 90 tablet, Rfl: 0 .  metFORMIN (GLUCOPHAGE-XR) 500 MG 24 hr tablet, TAKE ONE TABLET BY MOUTH EVERY MORNING WITH BREAKFAST, Disp: 90 tablet, Rfl: 1 .  mirtazapine (REMERON) 30 MG tablet, Take 1  tablet (30 mg total) by mouth at bedtime., Disp: 30 tablet, Rfl: 1 .  Multiple Vitamin (MULTIVITAMIN) tablet, Take 1 tablet by mouth daily., Disp: , Rfl:  .  nystatin-triamcinolone ointment (MYCOLOG), Apply 1 application topically 2 (two) times daily., Disp: 30 g, Rfl: 0 .  TRI-SPRINTEC 0.18/0.215/0.25 MG-35 MCG tablet, Take 1 tablet by mouth once daily, Disp: 84 tablet, Rfl: 0 .  venlafaxine XR (EFFEXOR XR) 75 MG 24 hr capsule, Take 3 capsules (225 mg total) by mouth daily with breakfast., Disp: 90 capsule, Rfl: 1 .  buPROPion (WELLBUTRIN XL) 150 MG 24 hr tablet, Take 1 tablet (150 mg total) by mouth daily., Disp: 30 tablet, Rfl: 1 Medication Side Effects: sexual dysfunction  Family Medical/ Social History: Changes? No  MENTAL HEALTH EXAM:  Blood pressure 140/90, pulse 73.There is no height or weight on file to calculate BMI.  General Appearance: Casual, Neat, Well Groomed and Obese  Eye Contact:  Good  Speech:  Clear and Coherent and Normal Rate  Volume:  Normal  Mood:  Euthymic  Affect:  Appropriate  Thought Process:  Goal Directed and Descriptions of Associations: Intact  Orientation:  Full (Time, Place, and Person)  Thought Content: Logical   Suicidal Thoughts:  No  Homicidal Thoughts:  No  Memory:  WNL  Judgement:  Good  Insight:  Good  Psychomotor Activity:  Normal  Concentration:  Concentration: Good  Recall:  Good  Fund of Knowledge: Good  Language: Good  Assets:  Desire for Improvement  ADL's:  Intact  Cognition: WNL  Prognosis:  Good  10/18/2019 labs show a hemoglobin A1c of 6.0.  Lipid panel total cholesterol of 191, triglycerides 227, HDL 44, non-HDL 146.  DIAGNOSES:    ICD-10-CM   1. Attention deficit hyperactivity disorder (ADHD), combined type  F90.2   2. Major depressive disorder, recurrent episode with mixed features ([place])  F33.9   3. Mixed obsessional thoughts and acts  F42.2   4. Generalized anxiety disorder  F41.1   5. PTSD (post-traumatic stress  disorder)  F43.10   6. Bipolar I disorder ([place])  F31.9   7. 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  RECOMMENDATIONS:  PDMP was reviewed. I spent 30 minutes with her. As far as the sexual dysfunction goes, I would like to try Wellbutrin again.  It can offset the side effects of the SSRIs or SNRIs. Due to the fact that she is on an antipsychotic that possibly can increase her blood sugar and lipids, I recommend having those labs done once a year at least.  She verbalizes understanding. Her blood pressure is running high but not worrisome as far as I am concerned.  I do recommend that she see her PCP in the near future to discuss that.   it would be a good idea for her to keep a blood pressure diary and take that with her to visit with her PCP. Continue Adderall XR 10 mg, 1 p.o. every morning. Continue Abilify 10 mg,  1 p.o. every morning. Start Wellbutrin XL 150 mg daily. Continue gabapentin 100 mg, 1 p.o. twice daily.  Per her PCP. Continue Effexor XR 75 mg, 3 p.o. every morning. Continue mirtazapine 30 mg, 1 p.o. nightly. Continue metformin XR 500 mg, 1 p.o. daily, per PCP. Continue therapy with Steffanie Rainwater. Return in 4 to 6 weeks.  Donnal Moat, PA-C

## 2020-03-24 ENCOUNTER — Other Ambulatory Visit: Payer: Self-pay | Admitting: Physician Assistant

## 2020-03-31 ENCOUNTER — Other Ambulatory Visit: Payer: Self-pay | Admitting: Physician Assistant

## 2020-04-05 ENCOUNTER — Other Ambulatory Visit: Payer: Self-pay | Admitting: Physician Assistant

## 2020-04-10 ENCOUNTER — Other Ambulatory Visit: Payer: Self-pay | Admitting: Physician Assistant

## 2020-04-10 ENCOUNTER — Telehealth: Payer: Self-pay | Admitting: Physician Assistant

## 2020-04-10 ENCOUNTER — Other Ambulatory Visit: Payer: Self-pay

## 2020-04-10 ENCOUNTER — Other Ambulatory Visit: Payer: Self-pay | Admitting: Family Medicine

## 2020-04-10 MED ORDER — AMPHETAMINE-DEXTROAMPHET ER 10 MG PO CP24: 10.0000 mg | ORAL_CAPSULE | Freq: Every day | ORAL | 0 refills | Status: DC

## 2020-04-10 NOTE — Telephone Encounter (Signed)
Requesting refill on Adderall. Please send to [facility].

## 2020-04-10 NOTE — Telephone Encounter (Signed)
Last refill 03/14/2020, pended for Crystal Bean to submit

## 2020-04-19 ENCOUNTER — Encounter: Payer: Self-pay | Admitting: Physician Assistant

## 2020-04-19 ENCOUNTER — Other Ambulatory Visit: Payer: Self-pay

## 2020-04-19 ENCOUNTER — Ambulatory Visit (INDEPENDENT_AMBULATORY_CARE_PROVIDER_SITE_OTHER): Payer: Medicare Other | Admitting: Physician Assistant

## 2020-04-19 VITALS — BP 156/96 | HR 71

## 2020-04-19 DIAGNOSIS — F19981 Other psychoactive substance use, unspecified with psychoactive substance-induced sexual dysfunction: Secondary | ICD-10-CM

## 2020-04-19 DIAGNOSIS — F411 Generalized anxiety disorder: Secondary | ICD-10-CM

## 2020-04-19 DIAGNOSIS — F4310 Post-traumatic stress disorder, unspecified: Secondary | ICD-10-CM

## 2020-04-19 DIAGNOSIS — F902 Attention-deficit hyperactivity disorder, combined type: Secondary | ICD-10-CM | POA: Diagnosis not present

## 2020-04-19 DIAGNOSIS — F422 Mixed obsessional thoughts and acts: Secondary | ICD-10-CM | POA: Diagnosis not present

## 2020-04-19 DIAGNOSIS — R030 Elevated blood-pressure reading, without diagnosis of hypertension: Secondary | ICD-10-CM

## 2020-04-19 NOTE — Progress Notes (Signed)
[facility]  Patient ID: Crystal Bean,  MRN: [id number]  PCP: Inda Coke, PA  Date of Evaluation: 04/19/2020 Time spent:30 minutes  Chief Complaint:  Chief Complaint    Follow-up      HISTORY/CURRENT STATUS: HPI For routine med check.  At [place], we added Wellbutrin to help with sexual side effects.  She might be a little bit better with decreased libido. She is not having any side effects from the medicine.    Her mood is stable.  She does get anxious, more so when she is out and about doing grocery shopping or at [facility] for example.  Recently she became panicky while in [place] and had to isolate herself in the clothing department for a few minutes to calm herself down and then she was able to go back out of the store and get her shopping done.  She still has some Xanax but is very careful not to take it since she is on the Adderall.  It is effective when she uses it.  Not obsessing about things like she was.  The Adderall is working well.  She is able to focus and finish tasks better than when she was not on the stimulant.  Energy and motivation are good.  She is able to enjoy things.  Appetite is normal.  She sleeps well.  No suicidal or homicidal thoughts.  Patient denies increased energy with decreased need for sleep, no increased talkativeness, no racing thoughts, no impulsivity or risky behaviors, no increased spending, no increased libido, no grandiosity, no increased irritability or anger, and no hallucinations.  He has checked her blood pressure a couple of times at home and it has always been anywhere from 120/80 to 130/90.  Denies dizziness, syncope, seizures, numbness, tingling, tremor, tics, unsteady gait, slurred speech, confusion. Denies muscle or joint pain, stiffness, or dystonia.  Individual Medical History/ Review of Systems: Changes? :No    Past medications for mental health diagnoses include: Prozac, Celexa, Lexapro, Wellbutrin, Effexor XR,  Pristiq, Abilify,  Adderall, Ritalin, Klonopin, Xanax, Gabapentin, Buspar  Allergies: Meloxicam and Penicillins  Current Medications:  Current Outpatient Medications:  .  amphetamine-dextroamphetamine (ADDERALL XR) 10 MG 24 hr capsule, Take 1 capsule (10 mg total) by mouth daily., Disp: 30 capsule, Rfl: 0 .  ARIPiprazole (ABILIFY) 10 MG tablet, Take 1 tablet (10 mg total) by mouth daily., Disp: 30 tablet, Rfl: 1 .  aspirin 81 MG chewable tablet, Chew by mouth daily., Disp: , Rfl:  .  buPROPion (WELLBUTRIN XL) 150 MG 24 hr tablet, Take 1 tablet (150 mg total) by mouth daily., Disp: 30 tablet, Rfl: 1 .  cyanocobalamin (,VITAMIN B-12,) 1000 MCG/ML injection, Inject 1 mL (1,000 mcg total) into the muscle every 30 (thirty) days., Disp: 3 mL, Rfl: 3 .  desonide (DESOWEN) 0.05 % cream, Apply topically 2 (two) times daily., Disp: 30 g, Rfl: 0 .  diclofenac (VOLTAREN) 75 MG EC tablet, TAKE ONE TABLET BY MOUTH TWICE A DAY, Disp: 60 tablet, Rfl: 0 .  esomeprazole (NEXIUM) 20 MG capsule, Take 20 mg by mouth daily at 12 noon., Disp: , Rfl:  .  gabapentin (NEURONTIN) 100 MG capsule, TAKE ONE CAPSULE BY MOUTH DAILY. MAY INCREASE AS TOLERATED TO TAKE THREE CAPSULES BY MOUTH DAILY, Disp: 90 capsule, Rfl: 0 .  hydrochlorothiazide (MICROZIDE) 12.5 MG capsule, Take 1 capsule by mouth once daily, Disp: 90 capsule, Rfl: 0 .  losartan (COZAAR) 50 MG tablet, Take 1 tablet by mouth once daily, Disp: 90  tablet, Rfl: 0 .  metFORMIN (GLUCOPHAGE-XR) 500 MG 24 hr tablet, TAKE ONE TABLET BY MOUTH EVERY MORNING WITH BREAKFAST, Disp: 90 tablet, Rfl: 1 .  mirtazapine (REMERON) 30 MG tablet, Take 1 tablet (30 mg total) by mouth at bedtime., Disp: 30 tablet, Rfl: 1 .  Multiple Vitamin (MULTIVITAMIN) tablet, Take 1 tablet by mouth daily., Disp: , Rfl:  .  nystatin-triamcinolone ointment (MYCOLOG), Apply 1 application topically 2 (two) times daily., Disp: 30 g, Rfl: 0 .  TRI-SPRINTEC 0.18/0.215/0.25 MG-35 MCG tablet, Take 1 tablet  by mouth once daily, Disp: 84 tablet, Rfl: 2 .  venlafaxine XR (EFFEXOR-XR) 75 MG 24 hr capsule, TAKE THREE CAPSULES BY MOUTH DAILY WITH BREAKFAST, Disp: 90 capsule, Rfl: 0 Medication Side Effects: none  Family Medical/ Social History: Changes? No  MENTAL HEALTH EXAM:  Blood pressure (!) 156/96, pulse 71.There is no height or weight on file to calculate BMI.  General Appearance: Casual, Neat, Well Groomed and Obese  Eye Contact:  Good  Speech:  Clear and Coherent and Normal Rate  Volume:  Normal  Mood:  Euthymic  Affect:  Appropriate  Thought Process:  Goal Directed and Descriptions of Associations: Intact  Orientation:  Full (Time, Place, and Person)  Thought Content: Logical   Suicidal Thoughts:  No  Homicidal Thoughts:  No  Memory:  WNL  Judgement:  Good  Insight:  Good  Psychomotor Activity:  Normal  Concentration:  Concentration: Good and Attention Span: Good  Recall:  Good  Fund of Knowledge: Good  Language: Good  Assets:  Desire for Improvement  ADL's:  Intact  Cognition: WNL  Prognosis:  Good    DIAGNOSES:    ICD-10-CM   1. Attention deficit hyperactivity disorder (ADHD), combined type  F90.2   2. Mixed obsessional thoughts and acts  F42.2   3. Generalized anxiety disorder  F41.1   4. PTSD (post-traumatic stress disorder)  F43.10   5. 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 Elevated blood pressure reading  R03.0     Receiving Psychotherapy: Yes  with Steffanie Rainwater.   RECOMMENDATIONS:  PDMP was reviewed. I spent 30 minutes with her. We discussed the Wellbutrin and the fact that she is only been on this dose for 1 month.  She may have even more improvement in the next 2 to 4 weeks.  I have asked her to call in 2 weeks and let me know if she is better as far as sexual side effects go.  If she is not at least 25 to 50% better, then we need to increase the Wellbutrin XL up to 300 mg.  She verbalizes understanding. Continue Wellbutrin  XL 150 mg, 1 p.o. daily. Continue Adderall XR 10 mg, 1 p.o. every morning. Continue Abilify 10 mg, 1 p.o. every morning. Continue gabapentin 100 mg, 1 p.o. twice daily. Continue mirtazapine 30 mg, 1 p.o. nightly. Continue Effexor XR 75 mg, 3 p.o. daily. Continue Xanax 1 mg, 1/2-1 daily as needed anxiety, use only for emergencies. She will continue to monitor her blood pressures at home.  I think she has whitecoat hypertension but we do need to watch it.  If it stays high I would like for her to see her PCP. Continue therapy. Return in 6 weeks.  Donnal Moat, PA-C

## 2020-04-26 ENCOUNTER — Other Ambulatory Visit: Payer: Self-pay | Admitting: Physician Assistant

## 2020-05-02 ENCOUNTER — Other Ambulatory Visit: Payer: Self-pay | Admitting: Physician Assistant

## 2020-05-09 ENCOUNTER — Other Ambulatory Visit: Payer: Self-pay | Admitting: Physician Assistant

## 2020-05-15 ENCOUNTER — Other Ambulatory Visit: Payer: Self-pay | Admitting: Physician Assistant

## 2020-05-16 ENCOUNTER — Other Ambulatory Visit: Payer: Self-pay | Admitting: Physician Assistant

## 2020-05-16 MED ORDER — AMPHETAMINE-DEXTROAMPHET ER 10 MG PO CP24: 10.0000 mg | ORAL_CAPSULE | Freq: Every day | ORAL | 0 refills | Status: DC

## 2020-05-18 ENCOUNTER — Other Ambulatory Visit: Payer: Self-pay | Admitting: Physician Assistant

## 2020-05-18 ENCOUNTER — Other Ambulatory Visit: Payer: Self-pay | Admitting: Family Medicine

## 2020-05-28 ENCOUNTER — Other Ambulatory Visit: Payer: Self-pay | Admitting: Physician Assistant

## 2020-06-01 ENCOUNTER — Encounter: Payer: Self-pay | Admitting: Physician Assistant

## 2020-06-01 ENCOUNTER — Ambulatory Visit (INDEPENDENT_AMBULATORY_CARE_PROVIDER_SITE_OTHER): Payer: Medicare Other | Admitting: Physician Assistant

## 2020-06-01 ENCOUNTER — Other Ambulatory Visit: Payer: Self-pay

## 2020-06-01 VITALS — BP 144/88 | HR 72

## 2020-06-01 DIAGNOSIS — F4010 Social phobia, unspecified: Secondary | ICD-10-CM

## 2020-06-01 DIAGNOSIS — F902 Attention-deficit hyperactivity disorder, combined type: Secondary | ICD-10-CM

## 2020-06-01 DIAGNOSIS — F3341 Major depressive disorder, recurrent, in partial remission: Secondary | ICD-10-CM

## 2020-06-01 DIAGNOSIS — F422 Mixed obsessional thoughts and acts: Secondary | ICD-10-CM | POA: Diagnosis not present

## 2020-06-01 DIAGNOSIS — F19981 Other psychoactive substance use, unspecified with psychoactive substance-induced sexual dysfunction: Secondary | ICD-10-CM

## 2020-06-01 DIAGNOSIS — F411 Generalized anxiety disorder: Secondary | ICD-10-CM | POA: Diagnosis not present

## 2020-06-01 MED ORDER — MIRTAZAPINE 15 MG PO TABS: ORAL_TABLET | ORAL | 0 refills | Status: DC

## 2020-06-01 MED ORDER — BUPROPION HCL ER (XL) 300 MG PO TB24: 300.0000 mg | ORAL_TABLET | Freq: Every day | ORAL | 1 refills | Status: DC

## 2020-06-01 MED ORDER — ARIPIPRAZOLE 10 MG PO TABS: 10.0000 mg | ORAL_TABLET | Freq: Every day | ORAL | 0 refills | Status: DC

## 2020-06-01 NOTE — Progress Notes (Signed)
[facility]  Patient ID: Crystal Bean,  MRN: [id number]  PCP: Inda Coke, PA  Date of Evaluation: 06/01/2020 Time spent:30 minutes  Chief Complaint:  Chief Complaint    Follow-up      HISTORY/CURRENT STATUS: HPI For routine med check.  Still gets anxious on a daily, was thinking it was only situational, but it happens daily, especially when having to be around other people.  Sometimes she'll be so anxious that she kind of curls up in a ball figurativly, and will even cry.  But usually if she distracts herself in some good way then it helps.  Being with her husband also helps.  The Adderall is working well.  She is able to focus and finish tasks better than when she was not on the stimulant.  Would like to increase the Wellbutrin.  It has helped slightly with sexual side effects but would like for it to help even more.  Still has memory issues and would like to decrease mirtazapine if possible. "I'm tired of having memory problems."  She would like to get off as many medications as possible.  She does not know if she was originally put on mirtazapine for sleep or depression.  Energy and motivation are good.  She is able to enjoy things.  Appetite is normal.  She sleeps well.  No suicidal or homicidal thoughts.  Patient denies increased energy with decreased need for sleep, no increased talkativeness, no racing thoughts, no impulsivity or risky behaviors, no increased spending, no increased libido, no grandiosity, no increased irritability or anger, no paranoia and no hallucinations.  Denies dizziness, syncope, seizures, numbness, tingling, tremor, tics, unsteady gait, slurred speech, confusion. Denies muscle or joint pain, stiffness, or dystonia.  Individual Medical History/ Review of Systems: Changes? :No    Past medications for mental health diagnoses include: Prozac, Celexa, Lexapro, Wellbutrin, Effexor XR, Pristiq, Abilify,  Adderall, Ritalin, Klonopin,  Xanax, Gabapentin, Buspar  Allergies: Meloxicam and Penicillins  Current Medications:  Current Outpatient Medications:  .  amphetamine-dextroamphetamine (ADDERALL XR) 10 MG 24 hr capsule, Take 1 capsule (10 mg total) by mouth daily., Disp: 30 capsule, Rfl: 0 .  ARIPiprazole (ABILIFY) 10 MG tablet, Take 1 tablet (10 mg total) by mouth daily., Disp: 90 tablet, Rfl: 0 .  aspirin 81 MG chewable tablet, Chew by mouth daily., Disp: , Rfl:  .  cyanocobalamin (,VITAMIN B-12,) 1000 MCG/ML injection, Inject 1 mL (1,000 mcg total) into the muscle every 30 (thirty) days., Disp: 3 mL, Rfl: 3 .  desonide (DESOWEN) 0.05 % cream, Apply topically 2 (two) times daily., Disp: 30 g, Rfl: 0 .  diclofenac (VOLTAREN) 75 MG EC tablet, TAKE ONE TABLET BY MOUTH TWICE A DAY, Disp: 60 tablet, Rfl: 0 .  esomeprazole (NEXIUM) 20 MG capsule, Take 20 mg by mouth daily at 12 noon., Disp: , Rfl:  .  gabapentin (NEURONTIN) 100 MG capsule, TAKE 1 CAPSULE BY MOUTH ONCE DAILY.   MAY INCREASE AS TOLERATED TO UP TO THREE CAPSULES DAILY (Patient taking differently: 100 mg 3 (three) times daily. ), Disp: 90 capsule, Rfl: 0 .  hydrochlorothiazide (MICROZIDE) 12.5 MG capsule, Take 1 capsule by mouth once daily, Disp: 90 capsule, Rfl: 0 .  losartan (COZAAR) 50 MG tablet, Take 1 tablet by mouth once daily, Disp: 90 tablet, Rfl: 0 .  metFORMIN (GLUCOPHAGE-XR) 500 MG 24 hr tablet, TAKE ONE TABLET BY MOUTH EVERY MORNING WITH BREAKFAST, Disp: 90 tablet, Rfl: 1 .  Multiple Vitamin (MULTIVITAMIN) tablet, Take 1  tablet by mouth daily., Disp: , Rfl:  .  nystatin-triamcinolone ointment (MYCOLOG), Apply 1 application topically 2 (two) times daily., Disp: 30 g, Rfl: 0 .  TRI-SPRINTEC 0.18/0.215/0.25 MG-35 MCG tablet, Take 1 tablet by mouth once daily, Disp: 84 tablet, Rfl: 2 .  venlafaxine XR (EFFEXOR-XR) 75 MG 24 hr capsule, TAKE THREE CAPSULES BY MOUTH EVERY MORNING WITHT BREAKFAST, Disp: 90 capsule, Rfl: 0 .  buPROPion (WELLBUTRIN XL) 300 MG 24 hr  tablet, Take 1 tablet (300 mg total) by mouth daily., Disp: 30 tablet, Rfl: 1 .  mirtazapine (REMERON) 15 MG tablet, 1.5 pills qhs  for 2 weeks, 1 po qhs for 2 weeks, then 1/2 po qhs till next OV, Disp: 45 tablet, Rfl: 0 Medication Side Effects: sexual dysfunction  Family Medical/ Social History: Changes? No  MENTAL HEALTH EXAM:  Blood pressure (!) 144/88, pulse 72.There is no height or weight on file to calculate BMI.  General Appearance: Casual, Neat, Well Groomed and Obese  Eye Contact:  Good  Speech:  Clear and Coherent and Normal Rate  Volume:  Normal  Mood:  Euthymic  Affect:  Appropriate  Thought Process:  Goal Directed and Descriptions of Associations: Intact  Orientation:  Full (Time, Place, and Person)  Thought Content: Logical   Suicidal Thoughts:  No  Homicidal Thoughts:  No  Memory:  WNL  Judgement:  Good  Insight:  Good  Psychomotor Activity:  Normal  Concentration:  Concentration: Good and Attention Span: Good  Recall:  Good  Fund of Knowledge: Good  Language: Good  Assets:  Desire for Improvement  ADL's:  Intact  Cognition: WNL  Prognosis:  Good    DIAGNOSES:    ICD-10-CM   1. Attention deficit hyperactivity disorder (ADHD), combined type  F90.2   2. Mixed obsessional thoughts and acts  F42.2   3. Recurrent major depressive disorder, in partial remission ([place])  F33.41   4. Generalized anxiety disorder  F41.1   5. Social phobia  F40.10   6. 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   RECOMMENDATIONS:  PDMP was reviewed. I provided 30 minutes of face-to-face time during this encounter. We discussed changes in her medications.  We can increase the Wellbutrin and hopefully that will help with the sexual side effects.  However, it might make her feel a little more anxious, especially the first 2 weeks.  It should improve after that.  If not, let me know. Decreasing the mirtazapine  is a fine idea.  We will go gradually over the next 6 weeks or so decreasing it.  We discussed the fact that she is on 3 different antidepressants at this point and may not need the mirtazapine at all.  However if she has trouble sleeping, we may need to use it as needed. Increase Wellbutrin XL to 300 mg p.o. every morning. Continue Adderall XR 10 mg, 1 p.o. every morning. Continue Abilify 10 mg, 1 p.o. every morning. Continue gabapentin 100 mg, 1 p.o. twice daily. Decrease mirtazapine to 15 mg, 1.5 pills nightly for 2 weeks, then 1 p.o. nightly for 2 weeks, then 1/2 pill nightly for 2 weeks.  We will discuss that further at the next visit. Continue Effexor XR 75 mg, 3 p.o. daily. Continue Xanax 1 mg, 1/2-1 daily as needed anxiety, use only for emergencies. Continue therapy. Return in 4 weeks   Donnal Moat, [place]

## 2020-06-10 ENCOUNTER — Other Ambulatory Visit: Payer: Self-pay | Admitting: Physician Assistant

## 2020-06-21 ENCOUNTER — Other Ambulatory Visit: Payer: Self-pay | Admitting: Physician Assistant

## 2020-06-25 ENCOUNTER — Other Ambulatory Visit: Payer: Self-pay | Admitting: Physician Assistant

## 2020-06-29 ENCOUNTER — Other Ambulatory Visit: Payer: Self-pay

## 2020-06-29 ENCOUNTER — Encounter: Payer: Self-pay | Admitting: Physician Assistant

## 2020-06-29 ENCOUNTER — Ambulatory Visit (INDEPENDENT_AMBULATORY_CARE_PROVIDER_SITE_OTHER): Payer: Medicare Other | Admitting: Physician Assistant

## 2020-06-29 DIAGNOSIS — F19981 Other psychoactive substance use, unspecified with psychoactive substance-induced sexual dysfunction: Secondary | ICD-10-CM

## 2020-06-29 DIAGNOSIS — F3341 Major depressive disorder, recurrent, in partial remission: Secondary | ICD-10-CM | POA: Diagnosis not present

## 2020-06-29 DIAGNOSIS — F4310 Post-traumatic stress disorder, unspecified: Secondary | ICD-10-CM | POA: Diagnosis not present

## 2020-06-29 DIAGNOSIS — F902 Attention-deficit hyperactivity disorder, combined type: Secondary | ICD-10-CM

## 2020-06-29 DIAGNOSIS — F4010 Social phobia, unspecified: Secondary | ICD-10-CM

## 2020-06-29 DIAGNOSIS — F422 Mixed obsessional thoughts and acts: Secondary | ICD-10-CM

## 2020-06-29 MED ORDER — BUPROPION HCL ER (XL) 300 MG PO TB24: 300.0000 mg | ORAL_TABLET | Freq: Every day | ORAL | 1 refills | Status: DC

## 2020-06-29 MED ORDER — VENLAFAXINE HCL ER 75 MG PO CP24: ORAL_CAPSULE | ORAL | 1 refills | Status: DC

## 2020-06-29 MED ORDER — AMPHETAMINE-DEXTROAMPHET ER 10 MG PO CP24: 10.0000 mg | ORAL_CAPSULE | Freq: Every day | ORAL | 0 refills | Status: DC

## 2020-06-29 MED ORDER — ALPRAZOLAM 1 MG PO TABS: 0.5000 mg | ORAL_TABLET | Freq: Two times a day (BID) | ORAL | 0 refills | Status: DC | PRN

## 2020-06-29 NOTE — Progress Notes (Signed)
[facility]  Patient ID: Crystal Bean,  MRN: [id number]  PCP: Inda Coke, PA  Date of Evaluation: 06/29/2020 Time spent:20 minutes  Chief Complaint:  Chief Complaint    Anxiety; Depression; Insomnia      HISTORY/CURRENT STATUS: HPI For routine med check.  Feels that meds are working fine.  The sexual dysfunction is some better since increasing the Wellbutrin.  The Adderall is working well.  She is able to focus and finish tasks better than when she was not on the stimulant.  Has been weaning off the Mirtazepine at my direction  since unsure if she needed to be on that anyway. Has had no withdrawal issues.   Energy and motivation are good.  She is able to enjoy things.  Appetite is normal.  She sleeps well. Anxiety isn't much of a problem but every once in a while she will need the Xanax.  Is using techniques she's learned in therapy to help.  No suicidal or homicidal thoughts.  Patient denies increased energy with decreased need for sleep, no increased talkativeness, no racing thoughts, no impulsivity or risky behaviors, no increased spending, no increased libido, no grandiosity, no increased irritability or anger, no paranoia and no hallucinations.  Denies dizziness, syncope, seizures, numbness, tingling, tremor, tics, unsteady gait, slurred speech, confusion. Denies muscle or joint pain, stiffness, or dystonia.  Individual Medical History/ Review of Systems: Changes? :No    Past medications for mental health diagnoses include: Prozac, Celexa, Lexapro, Wellbutrin, Effexor XR, Pristiq, Abilify,  Adderall, Ritalin, Klonopin, Xanax, Gabapentin, Buspar  Allergies: Meloxicam and Penicillins  Current Medications:  Current Outpatient Medications:  .  ALPRAZolam (XANAX) 1 MG tablet, Take 0.5-1 tablets (0.5-1 mg total) by mouth 2 (two) times daily as needed for anxiety. Rare, Disp: 60 tablet, Rfl: 0 .  amphetamine-dextroamphetamine (ADDERALL XR) 10 MG 24 hr  capsule, Take 1 capsule (10 mg total) by mouth daily., Disp: 30 capsule, Rfl: 0 .  ARIPiprazole (ABILIFY) 10 MG tablet, Take 1 tablet (10 mg total) by mouth daily., Disp: 90 tablet, Rfl: 0 .  aspirin 81 MG chewable tablet, Chew by mouth daily., Disp: , Rfl:  .  buPROPion (WELLBUTRIN XL) 300 MG 24 hr tablet, Take 1 tablet (300 mg total) by mouth daily., Disp: 90 tablet, Rfl: 1 .  cyanocobalamin (,VITAMIN B-12,) 1000 MCG/ML injection, Inject 1 mL (1,000 mcg total) into the muscle every 30 (thirty) days., Disp: 3 mL, Rfl: 3 .  desonide (DESOWEN) 0.05 % cream, Apply topically 2 (two) times daily., Disp: 30 g, Rfl: 0 .  diclofenac (VOLTAREN) 75 MG EC tablet, TAKE ONE TABLET BY MOUTH TWICE A DAY, Disp: 60 tablet, Rfl: 0 .  esomeprazole (NEXIUM) 20 MG capsule, Take 20 mg by mouth daily at 12 noon., Disp: , Rfl:  .  gabapentin (NEURONTIN) 100 MG capsule, TAKE ONE CAPSULE BY MOUTH DAILY. MAY INCREASE AS TOLERATED UP TO 3 CAPSULES DAILY (Patient taking differently: 100 mg 2 (two) times daily. TAKE ONE CAPSULE BY MOUTH DAILY. MAY INCREASE AS TOLERATED UP TO 3 CAPSULES DAILY), Disp: 90 capsule, Rfl: 0 .  hydrochlorothiazide (MICROZIDE) 12.5 MG capsule, Take 1 capsule by mouth once daily, Disp: 90 capsule, Rfl: 0 .  losartan (COZAAR) 50 MG tablet, Take 1 tablet by mouth once daily, Disp: 90 tablet, Rfl: 0 .  metFORMIN (GLUCOPHAGE-XR) 500 MG 24 hr tablet, TAKE ONE TABLET BY MOUTH EVERY MORNING WITH BREAKFAST, Disp: 90 tablet, Rfl: 1 .  mirtazapine (REMERON) 15 MG tablet, 1.5 pills  qhs  for 2 weeks, 1 po qhs for 2 weeks, then 1/2 po qhs till next OV (Patient taking differently: 15 mg. ), Disp: 45 tablet, Rfl: 0 .  Multiple Vitamin (MULTIVITAMIN) tablet, Take 1 tablet by mouth daily., Disp: , Rfl:  .  nystatin-triamcinolone ointment (MYCOLOG), Apply 1 application topically 2 (two) times daily., Disp: 30 g, Rfl: 0 .  TRI-SPRINTEC 0.18/0.215/0.25 MG-35 MCG tablet, Take 1 tablet by mouth once daily, Disp: 84 tablet,  Rfl: 2 .  venlafaxine XR (EFFEXOR-XR) 75 MG 24 hr capsule, TAKE THREE CAPSULES BY MOUTH EVERY MORNING WITH BREAKFAST, Disp: 270 capsule, Rfl: 1 .  [START ON 07/29/2020] amphetamine-dextroamphetamine (ADDERALL XR) 10 MG 24 hr capsule, Take 1 capsule (10 mg total) by mouth daily., Disp: 30 capsule, Rfl: 0 .  [START ON 08/27/2020] amphetamine-dextroamphetamine (ADDERALL XR) 10 MG 24 hr capsule, Take 1 capsule (10 mg total) by mouth daily., Disp: 30 capsule, Rfl: 0 Medication Side Effects: sexual dysfunction  Family Medical/ Social History: Changes? Moved her dtr into dorm at [employer], freshman year  [place]:  There were no vitals taken for this visit.There is no height or weight on file to calculate BMI.  General Appearance: Casual, Neat, Well Groomed and Obese  Eye Contact:  Good  Speech:  Clear and Coherent and Normal Rate  Volume:  Normal  Mood:  Euthymic  Affect:  Appropriate  Thought Process:  Goal Directed and Descriptions of Associations: Intact  Orientation:  Full (Time, Place, and Person)  Thought Content: Logical   Suicidal Thoughts:  No  Homicidal Thoughts:  No  Memory:  WNL  Judgement:  Good  Insight:  Good  Psychomotor Activity:  Normal  Concentration:  Concentration: Good and Attention Span: Good  Recall:  Good  Fund of Knowledge: Good  Language: Good  Assets:  Desire for Improvement  ADL's:  Intact  Cognition: WNL  Prognosis:  Good    DIAGNOSES:    ICD-10-CM   1. Recurrent major depressive disorder, in partial remission ([place])  F33.41   2. Mixed obsessional thoughts and acts  F42.2   3. PTSD (post-traumatic stress disorder)  F43.10   4. Social phobia  F40.10   5. Attention deficit hyperactivity disorder (ADHD), combined type  F90.2   6. 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   RECOMMENDATIONS:  PDMP was reviewed. I provided 20 minutes of face-to-face time during  this encounter. Glad to see her doing well! Cont Wellbutrin XL 300 mg p.o. every morning. Continue Adderall XR 10 mg, 1 p.o. every morning. Continue Abilify 10 mg, 1 p.o. every morning. Continue gabapentin 100 mg, 1 p.o. twice daily. Decrease mirtazapine 15 mg, 1/2 po qhs for 1 wk, then stop.  She can use 1/2- 1 po qhs prn after that.  Continue Effexor XR 75 mg, 3 p.o. daily. Continue Xanax 1 mg, 1/2-1 daily as needed anxiety, use only for emergencies. Continue therapy. Return in 3 months.   Donnal Moat, PA-C

## 2020-06-30 ENCOUNTER — Other Ambulatory Visit: Payer: Self-pay | Admitting: Physician Assistant

## 2020-07-01 ENCOUNTER — Other Ambulatory Visit: Payer: Self-pay | Admitting: Physician Assistant

## 2020-07-03 NOTE — Telephone Encounter (Signed)
Send in for prn only?

## 2020-07-19 ENCOUNTER — Other Ambulatory Visit: Payer: Self-pay | Admitting: Physician Assistant

## 2020-07-26 ENCOUNTER — Other Ambulatory Visit: Payer: Self-pay | Admitting: Physician Assistant

## 2020-07-27 ENCOUNTER — Other Ambulatory Visit: Payer: Self-pay | Admitting: Physician Assistant

## 2020-07-29 ENCOUNTER — Other Ambulatory Visit: Payer: Self-pay | Admitting: Physician Assistant

## 2020-08-28 ENCOUNTER — Other Ambulatory Visit: Payer: Self-pay | Admitting: Family Medicine

## 2020-08-29 ENCOUNTER — Other Ambulatory Visit: Payer: Self-pay | Admitting: Physician Assistant

## 2020-09-02 ENCOUNTER — Other Ambulatory Visit: Payer: Self-pay | Admitting: Physician Assistant

## 2020-09-04 NOTE — Telephone Encounter (Signed)
Please review

## 2020-09-05 ENCOUNTER — Other Ambulatory Visit: Payer: Self-pay | Admitting: *Deleted

## 2020-09-05 MED ORDER — GABAPENTIN 100 MG PO CAPS: ORAL_CAPSULE | ORAL | 0 refills | Status: DC

## 2020-09-20 ENCOUNTER — Other Ambulatory Visit: Payer: Self-pay

## 2020-09-20 ENCOUNTER — Other Ambulatory Visit: Payer: Self-pay | Admitting: *Deleted

## 2020-09-20 ENCOUNTER — Ambulatory Visit (INDEPENDENT_AMBULATORY_CARE_PROVIDER_SITE_OTHER): Payer: Medicare Other | Admitting: Physician Assistant

## 2020-09-20 ENCOUNTER — Encounter: Payer: Self-pay | Admitting: Physician Assistant

## 2020-09-20 VITALS — BP 120/70 | HR 73 | Temp 97.7°F | Ht 67.0 in | Wt 257.5 lb

## 2020-09-20 DIAGNOSIS — Z23 Encounter for immunization: Secondary | ICD-10-CM | POA: Diagnosis not present

## 2020-09-20 DIAGNOSIS — E669 Obesity, unspecified: Secondary | ICD-10-CM

## 2020-09-20 DIAGNOSIS — N3946 Mixed incontinence: Secondary | ICD-10-CM

## 2020-09-20 DIAGNOSIS — R7303 Prediabetes: Secondary | ICD-10-CM | POA: Diagnosis not present

## 2020-09-20 DIAGNOSIS — M1990 Unspecified osteoarthritis, unspecified site: Secondary | ICD-10-CM | POA: Diagnosis not present

## 2020-09-20 DIAGNOSIS — F339 Major depressive disorder, recurrent, unspecified: Secondary | ICD-10-CM

## 2020-09-20 NOTE — Progress Notes (Signed)
Crystal Bean is a 44 y.o. female is here for follow up.  I acted as a Education administrator for [employer], PA-C Anselmo Pickler, LPN   History of Present Illness:   Chief Complaint  Patient presents with  . Obesity  . Back Pain  . Urinary Incontinence    HPI   Obesity/Pre-Diabetes Following up, pt would to discuss other options for weight loss. Pt used Saxenda for one month but insurance would not cover. She is watching what she eats was riding a bike but her back too much had to stop. She has significantly cut down on portions. She thinks that this is helping her weight loss. She is on metformin 500 mg xr daily.  Back pain Pt has chronic low back pain, increased in pain the past 6 months, has trouble doing dishes. It currently using Ibuprofen and Tylenol with little relief. Diclofenac 75 mg BID with relief. She is hoping to get more active but her back pain is limiting her. We did an xray at beginning of 2020 and it showed diffuse arthritis.  Urinary incontinence Used to be able to hold her bladder for long periods of time.  Started having increasing in urge and not always having the ability to get to the bathroom. Goes to the bathroom about two times a night. Has never urinated on self at night. No new polydipsia, hematuria, odor with urination. No vaginal births. Hx of urethra stretched when she was a toddler or infant. Denies stool incontinence or saddle anesthesia.  Depression Currently seeing a therapist but is interested in a new therapist. Feels stable on medications -- currently on Xanax, adderall 10, abilify 10 mg, wellbutrin 300 mg xl,225 mg effexor. Denies SI/HI, but is having some occasional crying spells.  Health Maintenance Due  Topic Date Due  . Hepatitis C Screening  Never done  . HIV Screening  Never done    Past Medical History:  Diagnosis Date  . ADHD (attention deficit hyperactivity disorder)   . Anxiety   . B12 deficiency   . Bipolar 1 disorder ([place])   .  Bulging lumbar disc   . C. difficile diarrhea 04/24/2016   treated with metronidazole  . Depression   . GERD (gastroesophageal reflux disease)   . History of chicken pox   . History of shingles 2013  . Hypertension   . Migraines   . Obsessive-compulsive disorder   . Osteoarthritis   . PTSD (post-traumatic stress disorder)      Social History   Tobacco Use  . Smoking status: Former Smoker    Quit date: 06/19/2011    Years since quitting: 9.2  . Smokeless tobacco: Never Used  Vaping Use  . Vaping Use: Never used  Substance Use Topics  . Alcohol use: Not Currently    Comment: rare  . Drug use: No    Past Surgical History:  Procedure Laterality Date  . BLADDER SURGERY  1981   [place] stretched  . caps on teeth  1980   Caps on all Teeth  . CESAREAN SECTION  2003  . TONSILLECTOMY      Family History  Problem Relation Age of Onset  . Osteoporosis Mother   . Hearing loss Father   . Healthy Sister   . CVA Maternal Grandmother   . Heart disease Maternal Grandmother   . Arthritis Paternal Grandmother   . Hearing loss Paternal Grandmother   . Dementia Paternal Grandmother   . Dementia Paternal Grandfather   . Eczema  Daughter     PMHx, SurgHx, SocialHx, FamHx, Medications, and Allergies were reviewed in the Visit Navigator and updated as appropriate.   Patient Active Problem List   Diagnosis Date Noted  . Obsessive compulsive disorder 09/08/2018  . Bipolar 1 disorder ([place]) 08/26/2018  . Obesity 04/08/2018  . Essential hypertension 04/08/2018  . PTSD (post-traumatic stress disorder)   . Osteoarthritis   . Migraines   . GERD (gastroesophageal reflux disease)   . Major depressive disorder, recurrent episode with mixed features ([place])   . Bulging lumbar disc     Social History   Tobacco Use  . Smoking status: Former Smoker    Quit date: 06/19/2011    Years since quitting: 9.2  . Smokeless tobacco: Never Used  Vaping Use  . Vaping Use: Never used  Substance Use  Topics  . Alcohol use: Not Currently    Comment: rare  . Drug use: No    Current Medications and Allergies:    Current Outpatient Medications:  .  ALPRAZolam (XANAX) 1 MG tablet, Take 0.5-1 tablets (0.5-1 mg total) by mouth 2 (two) times daily as needed for anxiety. Rare, Disp: 60 tablet, Rfl: 0 .  amphetamine-dextroamphetamine (ADDERALL XR) 10 MG 24 hr capsule, Take 1 capsule (10 mg total) by mouth daily., Disp: 30 capsule, Rfl: 0 .  amphetamine-dextroamphetamine (ADDERALL XR) 10 MG 24 hr capsule, Take 1 capsule (10 mg total) by mouth daily., Disp: 30 capsule, Rfl: 0 .  amphetamine-dextroamphetamine (ADDERALL XR) 10 MG 24 hr capsule, Take 1 capsule (10 mg total) by mouth daily., Disp: 30 capsule, Rfl: 0 .  ARIPiprazole (ABILIFY) 10 MG tablet, TAKE ONE TABLET BY MOUTH DAILY, Disp: 90 tablet, Rfl: 0 .  aspirin 81 MG chewable tablet, Chew by mouth daily., Disp: , Rfl:  .  buPROPion (WELLBUTRIN XL) 300 MG 24 hr tablet, Take 1 tablet (300 mg total) by mouth daily., Disp: 90 tablet, Rfl: 1 .  desonide (DESOWEN) 0.05 % cream, Apply topically 2 (two) times daily., Disp: 30 g, Rfl: 0 .  diclofenac (VOLTAREN) 75 MG EC tablet, TAKE ONE TABLET BY MOUTH TWICE A DAY, Disp: 60 tablet, Rfl: 0 .  esomeprazole (NEXIUM) 20 MG capsule, Take 20 mg by mouth daily at 12 noon., Disp: , Rfl:  .  gabapentin (NEURONTIN) 100 MG capsule, TAKE 1 CAPSULE(S) BY MOUTH DAILY . MAY INCREASE AS TOLERATED UP TO 3 CAPSULES DAILY, Disp: 90 capsule, Rfl: 0 .  hydrochlorothiazide (MICROZIDE) 12.5 MG capsule, Take 1 capsule by mouth once daily, Disp: 90 capsule, Rfl: 0 .  losartan (COZAAR) 50 MG tablet, Take 1 tablet by mouth once daily, Disp: 90 tablet, Rfl: 0 .  metFORMIN (GLUCOPHAGE-XR) 500 MG 24 hr tablet, TAKE ONE TABLET BY MOUTH EVERY MORNING WITH BREAKFAST, Disp: 90 tablet, Rfl: 1 .  Multiple Vitamin (MULTIVITAMIN) tablet, Take 1 tablet by mouth daily., Disp: , Rfl:  .  nystatin-triamcinolone ointment (MYCOLOG), Apply 1  application topically 2 (two) times daily., Disp: 30 g, Rfl: 0 .  TRI-SPRINTEC 0.18/0.215/0.25 MG-35 MCG tablet, Take 1 tablet by mouth once daily, Disp: 84 tablet, Rfl: 2 .  venlafaxine XR (EFFEXOR-XR) 75 MG 24 hr capsule, TAKE THREE CAPSULES BY MOUTH EVERY MORNING WITH BREAKFAST, Disp: 270 capsule, Rfl: 1   Allergies  Allergen Reactions  . Meloxicam   . Penicillins     Review of Systems   ROS  Negative unless otherwise specified per HPI.  Vitals:   Vitals:   09/20/20 1445  BP: 120/70  Pulse: 73  Temp: 97.7 F (36.5 C)  TempSrc: Temporal  SpO2: 97%  Weight: 257 lb 8 oz (116.8 kg)  Height: 5\' 7"  (1.702 m)     Body mass index is 40.33 kg/m.   Physical Exam:    Physical Exam Vitals and nursing note reviewed.  Constitutional:      General: She is not in acute distress.    Appearance: She is well-developed. She is not ill-appearing or toxic-appearing.  Cardiovascular:     Rate and Rhythm: Normal rate and regular rhythm.     Pulses: Normal pulses.     Heart sounds: Normal heart sounds, S1 normal and S2 normal.     Comments: No LE edema Pulmonary:     Effort: Pulmonary effort is normal.     Breath sounds: Normal breath sounds.  Musculoskeletal:     Comments: Decreased ROM 2/2 pain with flexion/extension. Normal FROM with lateral side bends, or rotation. Reproducible tenderness with deep palpation to bilateral lumbar paraspinal muscles.    Skin:    General: Skin is warm and dry.  Neurological:     Mental Status: She is alert.     GCS: GCS eye subscore is 4. GCS verbal subscore is 5. GCS motor subscore is 6.  Psychiatric:        Speech: Speech normal.        Behavior: Behavior normal. Behavior is cooperative.      Assessment and Plan:    Crystal Bean was seen today for obesity, back pain and urinary incontinence.  Diagnoses and all orders for this visit:  Pre-diabetes; Obesity, unspecified classification, unspecified obesity type, unspecified whether serious  comorbidity present Update A1c today. Continue Metformin 500 mg XR daily, may increase vs add GLP-1 if insurance will cover to help with weight loss. Continue exercise and healthy eating. -     Hemoglobin A1c; Future  Osteoarthritis, unspecified osteoarthritis type, unspecified site Referral to PT. No red flags on exam. If no improvement, will trial Sports Medicine. -     Ambulatory referral to Physical Therapy -     CBC with Differential/Platelet; Future -     Comprehensive metabolic panel; Future  Mixed urge and stress incontinence Update UA. Refer to Pelvic Floor PT. If lack of improvement, will send to urology. We are going to hold her HCTZ and see if her symptoms improve with this. If BP consistently >140/90 without this, she was told to call us. Follow-up in 3 months. -     Urinalysis, Routine w reflex microscopic; Future -     Ambulatory referral to Physical Therapy  Major depressive disorder, recurrent episode with mixed features ([place]) Denies SI/HI. Referral to new therapist per patient request. Medication management per psychiatry. I discussed with patient that if they develop any SI, to tell someone immediately and seek medical attention. -     Ambulatory referral to Psychology  CMA or LPN served as scribe during this visit. History, Physical, and Plan performed by medical provider. The above documentation has been reviewed and is accurate and complete.  Inda Coke, PA-C [place] [place] 09/20/2020  Follow-up: No follow-ups on file.

## 2020-09-20 NOTE — Patient Instructions (Addendum)
It was great to see you!  Update labs today and urine today.  Referral for Crystal Bean, [place]  Referral for Physical Therapy here with [facility] for your back --> schedule this on your way out! Referral for Pelvic Floor Physical Therapy  Hold your HCTZ and monitor blood pressures, your goal is blood pressure <140/90.  Continue losartan.  Come back in see me in 3 months.  Flu shot today.

## 2020-09-21 LAB — CBC WITH DIFFERENTIAL/PLATELET
Absolute Monocytes: 777 cells/uL (ref 200–950)
Basophils Absolute: 95 cells/uL (ref 0–200)
Basophils Relative: 0.9 %
Eosinophils Absolute: 137 cells/uL (ref 15–500)
Eosinophils Relative: 1.3 %
HCT: 35.9 % (ref 35.0–45.0)
Hemoglobin: 12.3 g/dL (ref 11.7–15.5)
Lymphs Abs: 2594 cells/uL (ref 850–3900)
MCH: 30.5 pg (ref 27.0–33.0)
MCHC: 34.3 g/dL (ref 32.0–36.0)
MCV: 89.1 fL (ref 80.0–100.0)
MPV: 9.7 fL (ref 7.5–12.5)
Monocytes Relative: 7.4 %
Neutro Abs: 6899 cells/uL (ref 1500–7800)
Neutrophils Relative %: 65.7 %
Platelets: 464 10*3/uL — ABNORMAL HIGH (ref 140–400)
RBC: 4.03 10*6/uL (ref 3.80–5.10)
RDW: 14.5 % (ref 11.0–15.0)
Total Lymphocyte: 24.7 %
WBC: 10.5 10*3/uL (ref 3.8–10.8)

## 2020-09-21 LAB — COMPREHENSIVE METABOLIC PANEL
AG Ratio: 1.2 (calc) (ref 1.0–2.5)
ALT: 16 U/L (ref 6–29)
AST: 14 U/L (ref 10–30)
Albumin: 3.8 g/dL (ref 3.6–5.1)
Alkaline phosphatase (APISO): 74 U/L (ref 31–125)
BUN: 10 mg/dL (ref 7–25)
CO2: 29 mmol/L (ref 20–32)
Calcium: 9.4 mg/dL (ref 8.6–10.2)
Chloride: 99 mmol/L (ref 98–110)
Creat: 1.1 mg/dL (ref 0.50–1.10)
Globulin: 3.1 g/dL (calc) (ref 1.9–3.7)
Glucose, Bld: 75 mg/dL (ref 65–99)
Potassium: 4.5 mmol/L (ref 3.5–5.3)
Sodium: 137 mmol/L (ref 135–146)
Total Bilirubin: 0.2 mg/dL (ref 0.2–1.2)
Total Protein: 6.9 g/dL (ref 6.1–8.1)

## 2020-09-21 LAB — URINALYSIS, ROUTINE W REFLEX MICROSCOPIC
Bacteria, UA: NONE SEEN /HPF
Bilirubin Urine: NEGATIVE
Glucose, UA: NEGATIVE
Hgb urine dipstick: NEGATIVE
Hyaline Cast: NONE SEEN /LPF
Ketones, ur: NEGATIVE
Nitrite: NEGATIVE
Protein, ur: NEGATIVE
RBC / HPF: NONE SEEN /HPF (ref 0–2)
Specific Gravity, Urine: 1.014 (ref 1.001–1.03)
pH: 7 (ref 5.0–8.0)

## 2020-09-21 LAB — HEMOGLOBIN A1C
Hgb A1c MFr Bld: 5.7 % of total Hgb — ABNORMAL HIGH (ref <5.7)
Mean Plasma Glucose: 117 (calc)
eAG (mmol/L): 6.5 (calc)

## 2020-09-22 ENCOUNTER — Encounter: Payer: Self-pay | Admitting: Physician Assistant

## 2020-09-25 ENCOUNTER — Other Ambulatory Visit: Payer: Self-pay | Admitting: Physician Assistant

## 2020-09-25 DIAGNOSIS — E669 Obesity, unspecified: Secondary | ICD-10-CM

## 2020-09-25 MED ORDER — METFORMIN HCL ER 500 MG PO TB24: 1000.0000 mg | ORAL_TABLET | Freq: Every day | ORAL | 3 refills | Status: DC

## 2020-09-27 ENCOUNTER — Ambulatory Visit (INDEPENDENT_AMBULATORY_CARE_PROVIDER_SITE_OTHER): Payer: Medicare Other | Admitting: Diagnostic Neuroimaging

## 2020-09-27 ENCOUNTER — Telehealth: Payer: Self-pay | Admitting: Diagnostic Neuroimaging

## 2020-09-27 ENCOUNTER — Encounter: Payer: Self-pay | Admitting: Diagnostic Neuroimaging

## 2020-09-27 VITALS — BP 145/84 | HR 81 | Ht 66.0 in | Wt 256.0 lb

## 2020-09-27 DIAGNOSIS — R200 Anesthesia of skin: Secondary | ICD-10-CM

## 2020-09-27 DIAGNOSIS — R413 Other amnesia: Secondary | ICD-10-CM | POA: Diagnosis not present

## 2020-09-27 DIAGNOSIS — R0683 Snoring: Secondary | ICD-10-CM

## 2020-09-27 DIAGNOSIS — R0681 Apnea, not elsewhere classified: Secondary | ICD-10-CM

## 2020-09-27 NOTE — Telephone Encounter (Signed)
Medicare order sent to GI. No auth they will reach out to the patient to schedule.  

## 2020-09-27 NOTE — Patient Instructions (Signed)
  MEMORY LOSS / NUMBNESS / WORD FINDING DIFFICULTY  - repeat MRI brain   SNORING / APNEA  - recommend to check sleep study
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
## 2020-09-28 ENCOUNTER — Encounter: Payer: Self-pay | Admitting: Diagnostic Neuroimaging

## 2020-09-28 ENCOUNTER — Other Ambulatory Visit: Payer: Self-pay

## 2020-09-28 ENCOUNTER — Ambulatory Visit (INDEPENDENT_AMBULATORY_CARE_PROVIDER_SITE_OTHER): Payer: Medicare Other | Admitting: Physician Assistant

## 2020-09-28 ENCOUNTER — Encounter: Payer: Self-pay | Admitting: Physician Assistant

## 2020-09-28 VITALS — BP 154/98 | HR 74

## 2020-09-28 DIAGNOSIS — F411 Generalized anxiety disorder: Secondary | ICD-10-CM | POA: Diagnosis not present

## 2020-09-28 DIAGNOSIS — F422 Mixed obsessional thoughts and acts: Secondary | ICD-10-CM | POA: Diagnosis not present

## 2020-09-28 DIAGNOSIS — F902 Attention-deficit hyperactivity disorder, combined type: Secondary | ICD-10-CM

## 2020-09-28 DIAGNOSIS — F331 Major depressive disorder, recurrent, moderate: Secondary | ICD-10-CM

## 2020-09-28 DIAGNOSIS — R030 Elevated blood-pressure reading, without diagnosis of hypertension: Secondary | ICD-10-CM

## 2020-09-28 DIAGNOSIS — F4010 Social phobia, unspecified: Secondary | ICD-10-CM

## 2020-09-28 MED ORDER — AMPHETAMINE-DEXTROAMPHET ER 10 MG PO CP24: 10.0000 mg | ORAL_CAPSULE | Freq: Every day | ORAL | 0 refills | Status: DC

## 2020-09-28 MED ORDER — BUPROPION HCL ER (XL) 150 MG PO TB24: 450.0000 mg | ORAL_TABLET | Freq: Every day | ORAL | 1 refills | Status: DC

## 2020-09-28 NOTE — Progress Notes (Signed)
[facility]  Patient ID: Crystal Bean,  MRN: [id number]  PCP: Inda Coke, PA  Date of Evaluation: 09/28/2020 Time spent:20 minutes  Chief Complaint:  Chief Complaint    Anxiety; ADD; Depression      HISTORY/CURRENT STATUS: For routine med check.  Is under a lot of stress. Her husband is angry a lot, his job is extremely stressful. He's head of maintenance at [employer], and he's sometimes the last person to know when things need to be set up and such. He gets home and is very angry and irritable. He's not abusive at all, but it gets old for him to act like that. He's on medicine for depression also.   Is able to enjoy things.  Energy and motivation are low tho. Not isolating. Not crying easily. No SI/HIl.  The Adderall is working well.  She is able to focus and finish tasks better than when she was not on the stimulant.  Has weaned off Mirtazepine. Doing well.  Has had no withdrawal issues. Sleeps good most of the time.   Patient denies increased energy with decreased need for sleep, no increased talkativeness, no racing thoughts, no impulsivity or risky behaviors, no increased spending, no increased libido, no grandiosity, no increased irritability or anger, no paranoia and no hallucinations.  Denies dizziness, syncope, seizures, numbness, tingling, tremor, tics, unsteady gait, slurred speech, confusion. Denies muscle or joint pain, stiffness, or dystonia.  Individual Medical History/ Review of Systems: Changes? :No   Will get sleep study per neurology, also MRI b/c h/o a few "white patches" a few years ago.   Past medications for mental health diagnoses include: Prozac, Celexa, Lexapro, Wellbutrin, Effexor XR, Pristiq, Abilify,  Adderall, Ritalin, Klonopin, Xanax, Gabapentin, Buspar  Allergies: Meloxicam and Penicillins  Current Medications:  Current Outpatient Medications:  .  ALPRAZolam (XANAX) 1 MG tablet, Take 0.5-1 tablets (0.5-1 mg  total) by mouth 2 (two) times daily as needed for anxiety. Rare, Disp: 60 tablet, Rfl: 0 .  [START ON 11/26/2020] amphetamine-dextroamphetamine (ADDERALL XR) 10 MG 24 hr capsule, Take 1 capsule (10 mg total) by mouth daily., Disp: 30 capsule, Rfl: 0 .  [START ON 10/27/2020] amphetamine-dextroamphetamine (ADDERALL XR) 10 MG 24 hr capsule, Take 1 capsule (10 mg total) by mouth daily., Disp: 30 capsule, Rfl: 0 .  amphetamine-dextroamphetamine (ADDERALL XR) 10 MG 24 hr capsule, Take 1 capsule (10 mg total) by mouth daily., Disp: 30 capsule, Rfl: 0 .  ARIPiprazole (ABILIFY) 10 MG tablet, TAKE ONE TABLET BY MOUTH DAILY, Disp: 90 tablet, Rfl: 0 .  aspirin 81 MG chewable tablet, Chew by mouth daily., Disp: , Rfl:  .  desonide (DESOWEN) 0.05 % cream, Apply topically 2 (two) times daily., Disp: 30 g, Rfl: 0 .  diclofenac (VOLTAREN) 75 MG EC tablet, TAKE ONE TABLET BY MOUTH TWICE A DAY, Disp: 60 tablet, Rfl: 0 .  esomeprazole (NEXIUM) 20 MG capsule, Take 20 mg by mouth daily at 12 noon., Disp: , Rfl:  .  gabapentin (NEURONTIN) 100 MG capsule, TAKE 1 CAPSULE(S) BY MOUTH DAILY . MAY INCREASE AS TOLERATED UP TO 3 CAPSULES DAILY, Disp: 90 capsule, Rfl: 0 .  losartan (COZAAR) 50 MG tablet, Take 1 tablet by mouth once daily, Disp: 90 tablet, Rfl: 0 .  metFORMIN (GLUCOPHAGE-XR) 500 MG 24 hr tablet, Take 2 tablets (1,000 mg total) by mouth daily with breakfast., Disp: 60 tablet, Rfl: 3 .  Multiple Vitamin (MULTIVITAMIN) tablet, Take 1 tablet by mouth daily., Disp: , Rfl:  .  nystatin-triamcinolone ointment (MYCOLOG), Apply 1 application topically 2 (two) times daily., Disp: 30 g, Rfl: 0 .  TRI-SPRINTEC 0.18/0.215/0.25 MG-35 MCG tablet, Take 1 tablet by mouth once daily, Disp: 84 tablet, Rfl: 2 .  venlafaxine XR (EFFEXOR-XR) 75 MG 24 hr capsule, TAKE THREE CAPSULES BY MOUTH EVERY MORNING WITH BREAKFAST, Disp: 270 capsule, Rfl: 1 .  buPROPion (WELLBUTRIN XL) 150 MG 24 hr tablet, Take 3 tablets (450 mg total) by mouth  daily., Disp: 270 tablet, Rfl: 1 .  hydrochlorothiazide (MICROZIDE) 12.5 MG capsule, Take 1 capsule by mouth once daily (Patient not taking: Reported on 09/27/2020), Disp: 90 capsule, Rfl: 0 Medication Side Effects: sexual dysfunction  Family Medical/ Social History: Changes? See above.  MENTAL HEALTH EXAM:  Blood pressure (!) 154/98, pulse 74, last menstrual period 09/11/2020.There is no height or weight on file to calculate BMI.  General Appearance: Casual, Neat, Well Groomed and Obese  Eye Contact:  Good  Speech:  Clear and Coherent and Normal Rate  Volume:  Normal  Mood:  Euthymic  Affect:  Appropriate  Thought Process:  Goal Directed and Descriptions of Associations: Intact  Orientation:  Full (Time, Place, and Person)  Thought Content: Logical   Suicidal Thoughts:  No  Homicidal Thoughts:  No  Memory:  WNL  Judgement:  Good  Insight:  Good  Psychomotor Activity:  Normal  Concentration:  Concentration: Good and Attention Span: Good  Recall:  Good  Fund of Knowledge: Good  Language: Good  Assets:  Desire for Improvement  ADL's:  Intact  Cognition: WNL  Prognosis:  Good   Most pertinent recent labs: 09/20/2020  CMP  Glucose 75, everything else was nl too. Hgb A1C 5.7 Last LP 09/2019  DIAGNOSES:    ICD-10-CM   1. Major depressive disorder, recurrent episode, moderate (HCC)  F33.1   2. Social phobia  F40.10   3. Mixed obsessional thoughts and acts  F42.2   4. Generalized anxiety disorder  F41.1   5. Attention deficit hyperactivity disorder (ADHD), combined type  F90.2   6. Elevated blood pressure reading  R03.0     Receiving Psychotherapy: Yes  will be starting with a new therapist in Dec, [place], [place].    RECOMMENDATIONS:  PDMP was reviewed. I provided 20 minutes of face-to-face time during this encounter. Continue to watch Bps. Her PCP is aware and is following.  Recommend increasing Wellbutrin to help with energy and motivation. Increase Wellbutrin XL  to 450 mg p.o. every morning. Continue Adderall XR 10 mg, 1 p.o. every morning. Continue Abilify 10 mg, 1 p.o. every morning. Continue gabapentin 100 mg, 1 p.o. twice daily. Continue Effexor XR 75 mg, 3 p.o. daily. Continue Xanax 1 mg, 1/2-1 daily as needed anxiety, use only for emergencies. Continue therapy. Return in 3 months.   Donnal Moat, PA-C

## 2020-10-01 ENCOUNTER — Other Ambulatory Visit: Payer: Self-pay | Admitting: Physician Assistant

## 2020-10-03 ENCOUNTER — Other Ambulatory Visit: Payer: Self-pay | Admitting: Physician Assistant

## 2020-10-16 ENCOUNTER — Ambulatory Visit
Admission: RE | Admit: 2020-10-16 | Discharge: 2020-10-16 | Disposition: A | Discharge status: Home / Self Care | Payer: Medicare Other | Source: Ambulatory Visit | Attending: Diagnostic Neuroimaging | Admitting: Diagnostic Neuroimaging

## 2020-10-16 ENCOUNTER — Other Ambulatory Visit: Payer: Self-pay

## 2020-10-16 DIAGNOSIS — R413 Other amnesia: Secondary | ICD-10-CM | POA: Diagnosis not present

## 2020-10-16 DIAGNOSIS — R0681 Apnea, not elsewhere classified: Secondary | ICD-10-CM

## 2020-10-16 DIAGNOSIS — R0683 Snoring: Secondary | ICD-10-CM

## 2020-10-16 DIAGNOSIS — R200 Anesthesia of skin: Secondary | ICD-10-CM

## 2020-10-16 MED ORDER — GADOBENATE DIMEGLUMINE 529 MG/ML IV SOLN
20.0000 mL | Freq: Once | INTRAVENOUS | Status: AC | PRN
  Administered 2020-10-16: 20 mL via INTRAVENOUS

## 2020-10-23 ENCOUNTER — Encounter: Payer: Self-pay | Admitting: *Deleted

## 2020-10-30 ENCOUNTER — Ambulatory Visit (INDEPENDENT_AMBULATORY_CARE_PROVIDER_SITE_OTHER): Payer: Medicare Other | Admitting: Neurology

## 2020-10-30 ENCOUNTER — Other Ambulatory Visit: Payer: Self-pay

## 2020-10-30 ENCOUNTER — Ambulatory Visit: Payer: Medicare Other | Admitting: Physician Assistant

## 2020-10-30 ENCOUNTER — Encounter: Payer: Self-pay | Admitting: Neurology

## 2020-10-30 VITALS — BP 128/82 | HR 84 | Ht 67.0 in | Wt 257.0 lb

## 2020-10-30 DIAGNOSIS — M2619 Other specified anomalies of jaw-cranial base relationship: Secondary | ICD-10-CM

## 2020-10-30 DIAGNOSIS — R351 Nocturia: Secondary | ICD-10-CM | POA: Diagnosis not present

## 2020-10-30 DIAGNOSIS — F422 Mixed obsessional thoughts and acts: Secondary | ICD-10-CM

## 2020-10-30 DIAGNOSIS — G4719 Other hypersomnia: Secondary | ICD-10-CM | POA: Diagnosis not present

## 2020-10-30 DIAGNOSIS — F908 Attention-deficit hyperactivity disorder, other type: Secondary | ICD-10-CM

## 2020-10-30 DIAGNOSIS — R0683 Snoring: Secondary | ICD-10-CM

## 2020-10-30 DIAGNOSIS — Z6841 Body Mass Index (BMI) 40.0 and over, adult: Secondary | ICD-10-CM
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
## 2020-10-30 NOTE — Patient Instructions (Signed)
Screening for Sleep Apnea  Sleep apnea is a condition in which breathing pauses or becomes shallow during sleep. Sleep apnea screening is a test to determine if you are at risk for sleep apnea. The test is easy and only takes a few minutes. Your health care provider may ask you to have this test in preparation for surgery or as part of a physical exam. What are the symptoms of sleep apnea? Common symptoms of sleep apnea include:  Snoring.  Restless sleep.  Daytime sleepiness.  Pauses in breathing.  Choking during sleep.  Irritability.  Forgetfulness.  Trouble thinking clearly.  Depression.  Personality changes. Most people with sleep apnea are not aware that they have it. Why should I get screened? Getting screened for sleep apnea can help:  Ensure your safety. It is important for your health care providers to know whether or not you have sleep apnea, especially if you are having surgery or have other long-term (chronic) health conditions.  Improve your health and allow you to get a better night's rest. Restful sleep can help you: ? Have more energy. ? Lose weight. ? Improve high blood pressure. ? Improve diabetes management. ? Prevent stroke. ? Prevent car accidents. How is screening done? Screening usually includes being asked a list of questions about your sleep quality. Some questions you may be asked include:  Do you snore?  Is your sleep restless?  Do you have daytime sleepiness?  Has a partner or spouse told you that you stop breathing during sleep?  Have you had trouble concentrating or memory loss? If your screening test is positive, you are at risk for the condition. Further testing may be needed to confirm a diagnosis of sleep apnea. Where to find more information You can find screening tools online or at your health care clinic. For more information about sleep apnea screening and healthy sleep, visit these websites:  Centers for Disease Control and  Prevention: LearningDermatology.pl  American Sleep Apnea Association: www.sleepapnea.org Contact a health care provider if:  You think that you may have sleep apnea. Summary  Sleep apnea screening can help determine if you are at risk for sleep apnea.  It is important for your health care providers to know whether or not you have sleep apnea, especially if you are having surgery or have other chronic health conditions.  You may be asked to take a screening test for sleep apnea in preparation for surgery or as part of a physical exam. This information is not intended to replace advice given to you by your health care provider. Make sure you discuss any questions you have with your health care provider. Document Revised: 08/21/2018 Document Reviewed: 02/14/2017 Elsevier Patient Education  [employer]. Hypersomnia Hypersomnia is a condition in which a person feels very tired during the day even though he or she gets plenty of sleep at night. A person with this condition may take naps during the day and may find it very difficult to wake up from sleep. Hypersomnia may affect a person's ability to think, concentrate, drive, or remember things. What are the causes? The cause of this condition may not be known. Possible causes include:  Certain medicines.  Sleep disorders, such as narcolepsy and sleep apnea.  Injury to the head, brain, or spinal cord.  Drug or alcohol use.  Gastroesophageal reflux disease (GERD).  Tumors.  Certain medical conditions, such as depression, diabetes, or an underactive thyroid gland (hypothyroidism). What are the signs or symptoms? The main symptoms  of hypersomnia include:  Feeling very tired throughout the day, regardless of how much sleep you got the night before.  Having trouble waking up. Others may find it difficult to wake you up when you are sleeping.  Sleeping for longer and longer periods at a time.  Taking naps throughout the  day. Other symptoms may include:  Feeling restless, anxious, or annoyed.  Lacking energy.  Having trouble with: ? Remembering. ? Speaking. ? Thinking.  Loss of appetite.  Seeing, hearing, tasting, smelling, or feeling things that are not real (hallucinations). How is this diagnosed? This condition may be diagnosed based on:  Your symptoms and medical history.  Your sleeping habits. Your health care provider may ask you to write down your sleeping habits in a daily sleep log, along with any symptoms you have.  A series of tests that are done while you sleep (sleep study or polysomnogram).  A test that measures how quickly you can fall asleep during the day (daytime nap study or multiple sleep latency test). How is this treated? Treatment can help you manage your condition. Treatment may include:  Following a regular sleep routine.  Lifestyle changes, such as changing your eating habits, getting regular exercise, and avoiding alcohol or caffeinated beverages.  Taking medicines to make you more alert (stimulants) during the day.  Treating any underlying medical causes of hypersomnia. Follow these instructions at home: Sleep routine   Schedule the same bedtime and wake-up time each day.  Practice a relaxing bedtime routine. This may include reading, meditation, deep breathing, or taking a warm bath before going to sleep.  Get regular exercise each day. Avoid strenuous exercise in the evening hours.  Keep your sleep environment at a cooler temperature, darkened, and quiet.  Sleep with pillows and a mattress that are comfortable and supportive.  Schedule short 20-minute naps for when you feel sleepiest during the day.  Talk with your employer or teachers about your hypersomnia. If possible, adjust your schedule so that: ? You have a regular daytime work schedule. ? You can take a scheduled nap during the day. ? You do not have to work or be active at night.  Do not  eat a heavy meal for a few hours before bedtime. Eat your meals at about the same times every day.  Avoid drinking alcohol or caffeinated beverages. Safety   Do not drive or use heavy machinery if you are sleepy. Ask your health care provider if it is safe for you to drive.  Wear a life jacket when swimming or spending time near water. General instructions  Take supplements and over-the-counter and prescription medicines only as told by your health care provider.  Keep a sleep log that will help your doctor manage your condition. This may include information about: ? What time you go to bed each night. ? How often you wake up at night. ? How many hours you sleep at night. ? How often and for how long you nap during the day. ? Any observations from others, such as leg movements during sleep, sleep walking, or snoring.  Keep all follow-up visits as told by your health care provider. This is important. Contact a health care provider if:  You have new symptoms.  Your symptoms get worse. Get help right away if:  You have serious thoughts about hurting yourself or someone else. If you ever feel like you may hurt yourself or others, or have thoughts about taking your own life, get help right away.  You can go to your nearest emergency department or call:  Your local emergency services (911 in the [place]  A suicide crisis helpline, such as the [place] at 2032074933. This is open 24 hours a day. Summary  Hypersomnia refers to a condition in which you feel very tired during the day even though you get plenty of sleep at night.  A person with this condition may take naps during the day and may find it very difficult to wake up from sleep.  Hypersomnia may affect a person's ability to think, concentrate, drive, or remember things.  Treatment, such as following a regular sleep routine and making some lifestyle changes, can help you manage your  condition. This information is not intended to replace advice given to you by your health care provider. Make sure you discuss any questions you have with your health care provider. Document Revised: 11/06/2017 Document Reviewed: 11/06/2017 Elsevier Patient Education  2020 [employer].

## 2020-10-31 ENCOUNTER — Other Ambulatory Visit: Payer: Self-pay | Admitting: Physician Assistant

## 2020-10-31 ENCOUNTER — Encounter: Payer: Self-pay | Admitting: Physician Assistant

## 2020-10-31 ENCOUNTER — Ambulatory Visit (INDEPENDENT_AMBULATORY_CARE_PROVIDER_SITE_OTHER): Payer: Medicare Other | Admitting: Physician Assistant

## 2020-10-31 VITALS — BP 140/90 | HR 69 | Temp 98.0°F | Ht 67.0 in | Wt 257.0 lb

## 2020-10-31 DIAGNOSIS — R0789 Other chest pain: Secondary | ICD-10-CM

## 2020-10-31 MED ORDER — DICLOFENAC SODIUM 75 MG PO TBEC: 75.0000 mg | DELAYED_RELEASE_TABLET | Freq: Two times a day (BID) | ORAL | 0 refills | Status: DC

## 2020-10-31 NOTE — Progress Notes (Signed)
Crystal Bean is a 44 y.o. female here for a new problem.  I acted as a Education administrator for [employer], PA-C Anselmo Pickler, LPN   History of Present Illness:   Chief Complaint  Patient presents with  . Muscle strain    HPI    Muscle strain Pt c/o pain right upper chest area near shoulder, radiates down chest and around to her back. Started about 2 weeks, pt does not recall doing anything. Hurts when she takes a deep breath. She has been using Tylenol and Ibuprofen with some relief. She will take two tylenol and four ibuprofen for her symptoms. She is already taking oral diclofenac 75 mg BID. Symptoms are overall 2-3/10.   Denies: abdominal pain, pain with eating, SOB, L-sided chest pain, worsening pain with activity  Does not cause decreased ROM of her R arm.   Wt Readings from Last 4 Encounters:  10/31/20 257 lb (116.6 kg)  10/30/20 257 lb (116.6 kg)  09/27/20 256 lb (116.1 kg)  09/20/20 257 lb 8 oz (116.8 kg)   She is due for mammogram next month.  Does have significant 20-pack year history. Has had night sweats for about a year. Denies unintentional weight loss, palpitations, hemoptysis, recent URI.  Past Medical History:  Diagnosis Date  . ADHD (attention deficit hyperactivity disorder)   . Anxiety   . B12 deficiency   . Bipolar 1 disorder ([name])   . Bulging lumbar disc   . C. difficile diarrhea 04/24/2016   treated with metronidazole  . Depression   . GERD (gastroesophageal reflux disease)   . History of chicken pox   . History of shingles 2013  . Hypertension   . Migraines   . Obsessive-compulsive disorder   . Osteoarthritis   . PTSD (post-traumatic stress disorder)      Social History   Tobacco Use  . Smoking status: Former Smoker    Packs/day: 1.00    Years: 20.00    Pack years: 20.00    Quit date: 06/19/2011    Years since quitting: 9.3  . Smokeless tobacco: Never Used  Vaping Use  . Vaping Use: Never used  Substance Use Topics  . Alcohol use: Not  Currently  . Drug use: No    Past Surgical History:  Procedure Laterality Date  . BLADDER SURGERY  1981   Urethera stretched  . caps on teeth  1980   Caps on all Teeth  . CESAREAN SECTION  2003  . TONSILLECTOMY      Family History  Problem Relation Age of Onset  . Osteoporosis Mother   . Hearing loss Father   . Healthy Sister   . CVA Maternal Grandmother   . Heart disease Maternal Grandmother   . Arthritis Paternal Grandmother   . Hearing loss Paternal Grandmother   . Dementia Paternal Grandmother   . Dementia Paternal Grandfather   . Eczema Daughter     Allergies  Allergen Reactions  . Meloxicam Rash  . Penicillins Rash    Current Medications:   Current Outpatient Medications:  .  ALPRAZolam (XANAX) 1 MG tablet, Take 0.5-1 tablets (0.5-1 mg total) by mouth 2 (two) times daily as needed for anxiety. Rare, Disp: 60 tablet, Rfl: 0 .  [START ON 11/26/2020] amphetamine-dextroamphetamine (ADDERALL XR) 10 MG 24 hr capsule, Take 1 capsule (10 mg total) by mouth daily., Disp: 30 capsule, Rfl: 0 .  amphetamine-dextroamphetamine (ADDERALL XR) 10 MG 24 hr capsule, Take 1 capsule (10 mg total) by mouth daily.,  Disp: 30 capsule, Rfl: 0 .  amphetamine-dextroamphetamine (ADDERALL XR) 10 MG 24 hr capsule, Take 1 capsule (10 mg total) by mouth daily., Disp: 30 capsule, Rfl: 0 .  ARIPiprazole (ABILIFY) 10 MG tablet, TAKE ONE TABLET BY MOUTH DAILY, Disp: 90 tablet, Rfl: 0 .  aspirin 81 MG chewable tablet, Chew by mouth daily., Disp: , Rfl:  .  buPROPion (WELLBUTRIN XL) 150 MG 24 hr tablet, Take 3 tablets (450 mg total) by mouth daily., Disp: 270 tablet, Rfl: 1 .  desonide (DESOWEN) 0.05 % cream, Apply topically 2 (two) times daily., Disp: 30 g, Rfl: 0 .  esomeprazole (NEXIUM) 20 MG capsule, Take 20 mg by mouth daily at 12 noon., Disp: , Rfl:  .  gabapentin (NEURONTIN) 100 MG capsule, TAKE ONE CAPSULE BY MOUTH DAILY - MAY INCREASE TO THREE CAPSULES DAILY AS TOLERATED, Disp: 90 capsule, Rfl:  0 .  losartan (COZAAR) 50 MG tablet, Take 1 tablet by mouth once daily, Disp: 90 tablet, Rfl: 0 .  metFORMIN (GLUCOPHAGE-XR) 500 MG 24 hr tablet, Take 2 tablets (1,000 mg total) by mouth daily with breakfast., Disp: 60 tablet, Rfl: 3 .  Multiple Vitamin (MULTIVITAMIN) tablet, Take 1 tablet by mouth daily., Disp: , Rfl:  .  nystatin-triamcinolone ointment (MYCOLOG), Apply 1 application topically 2 (two) times daily., Disp: 30 g, Rfl: 0 .  TRI-SPRINTEC 0.18/0.215/0.25 MG-35 MCG tablet, Take 1 tablet by mouth once daily, Disp: 84 tablet, Rfl: 2 .  venlafaxine XR (EFFEXOR-XR) 75 MG 24 hr capsule, TAKE THREE CAPSULES BY MOUTH EVERY MORNING WITH BREAKFAST, Disp: 270 capsule, Rfl: 1 .  diclofenac (VOLTAREN) 75 MG EC tablet, Take 1 tablet (75 mg total) by mouth 2 (two) times daily., Disp: 60 tablet, Rfl: 0   Review of Systems:   ROS  Negative unless otherwise specified per HPI.  Vitals:   Vitals:   10/31/20 1012  BP: 140/90  Pulse: 69  Temp: 98 F (36.7 C)  TempSrc: Temporal  SpO2: 96%  Weight: 257 lb (116.6 kg)  Height: 5\' 7"  (1.702 m)     Body mass index is 40.25 kg/m.  Physical Exam:   Physical Exam Vitals and nursing note reviewed.  Constitutional:      General: She is not in acute distress.    Appearance: She is well-developed. She is not ill-appearing, toxic-appearing or sickly-appearing.  Cardiovascular:     Rate and Rhythm: Normal rate and regular rhythm.     Pulses: Normal pulses.     Heart sounds: Normal heart sounds, S1 normal and S2 normal.     Comments: No LE edema Pulmonary:     Effort: Pulmonary effort is normal.     Breath sounds: Normal breath sounds.  Chest:     Comments: No TTP of chest wall. No abnormalities palpated. Skin:    General: Skin is warm, dry and intact.  Neurological:     Mental Status: She is alert.     GCS: GCS eye subscore is 4. GCS verbal subscore is 5. GCS motor subscore is 6.  Psychiatric:        Mood and Affect: Mood and affect  normal.        Speech: Speech normal.        Behavior: Behavior normal. Behavior is cooperative.      Assessment and Plan:   Rhythm was seen today for muscle strain.  Diagnoses and all orders for this visit:  Right-sided chest wall pain No red flags on exam. Likely MSK strain, however  will order chest xray to further investigate and have patient schedule her mammogram for further evaluation. Follow-up based on results of studies and clinical symptoms. -     DG Chest 2 View; Future  Other orders -     diclofenac (VOLTAREN) 75 MG EC tablet; Take 1 tablet (75 mg total) by mouth 2 (two) times daily.    CMA or LPN served as scribe during this visit. History, Physical, and Plan performed by medical provider. The above documentation has been reviewed and is accurate and complete.  Inda Coke, PA-C

## 2020-10-31 NOTE — Patient Instructions (Addendum)
It was great to see you!  An order for an xray has been put in for you. To get your xray, you can walk in at the [facility] location without a scheduled appointment.  The address is [address]. It is across the street from [place] is located in the basement.  Hours of operation are M-F 8:30am to 5:00pm. Please note that they are closed for lunch between 12:30 and 1:00pm.  Please call to schedule your mammogram: [phone]. It looks like you will be due after 12/08/20.  We will explore further based on results of tests and how you are feeling.  Take care,  Inda Coke PA-C

## 2020-11-01 ENCOUNTER — Ambulatory Visit (INDEPENDENT_AMBULATORY_CARE_PROVIDER_SITE_OTHER)
Admission: RE | Admit: 2020-11-01 | Discharge: 2020-11-01 | Disposition: A | Discharge status: Home / Self Care | Payer: Medicare Other | Source: Ambulatory Visit | Attending: Physician Assistant | Admitting: Physician Assistant

## 2020-11-01 ENCOUNTER — Other Ambulatory Visit: Payer: Self-pay

## 2020-11-01 DIAGNOSIS — R0789 Other chest pain: Secondary | ICD-10-CM | POA: Diagnosis not present

## 2020-11-06 ENCOUNTER — Ambulatory Visit: Payer: Medicare Other | Attending: Physician Assistant | Admitting: Physical Therapy

## 2020-11-06 ENCOUNTER — Other Ambulatory Visit: Payer: Self-pay

## 2020-11-06 ENCOUNTER — Encounter: Payer: Self-pay | Admitting: Physical Therapy

## 2020-11-06 DIAGNOSIS — M6281 Muscle weakness (generalized): Secondary | ICD-10-CM | POA: Diagnosis present

## 2020-11-06 DIAGNOSIS — R293 Abnormal posture: Secondary | ICD-10-CM | POA: Diagnosis present

## 2020-11-06 NOTE — Therapy (Signed)
[facility] [address] [place], [place], [address] Phone: [phone]   Fax:  [phone]  Physical Therapy Evaluation  Patient Details  Name: Crystal Bean MRN: [id number] Date of Birth: 19-Oct-1976 Referring Provider (PT): Inda Coke, [place]   Encounter Date: 11/06/2020   PT End of Session - 11/06/20 0916    Visit Number 1    Date for PT Re-Evaluation 01/01/21    PT Start Time 0802    PT Stop Time 0840    PT Time Calculation (min) 38 min    Activity Tolerance Patient tolerated treatment well    Behavior During Therapy [facility] for tasks assessed/performed           Past Medical History:  Diagnosis Date  . ADHD (attention deficit hyperactivity disorder)   . Anxiety   . B12 deficiency   . Bipolar 1 disorder ([place])   . Bulging lumbar disc   . C. difficile diarrhea 04/24/2016   treated with metronidazole  . Depression   . GERD (gastroesophageal reflux disease)   . History of chicken pox   . History of shingles 2013  . Hypertension   . Migraines   . Obsessive-compulsive disorder   . Osteoarthritis   . PTSD (post-traumatic stress disorder)     Past Surgical History:  Procedure Laterality Date  . BLADDER SURGERY  1981   Urethera stretched  . caps on teeth  1980   Caps on all Teeth  . CESAREAN SECTION  2003  . TONSILLECTOMY      There were no vitals filed for this visit.    Subjective Assessment - 11/06/20 0805    Subjective Pt states she is using 1 pad per day for coughing and sneezing.  Only 2x had extreme times. Sitting for a while and stand up I have a lot of urge.  I used to be able to wait a long time after feeling and urge to go.    Patient Stated Goals be able to wait an hour    Currently in Pain? No/denies              [facility] PT Assessment - 11/06/20 0001      Assessment   Medical Diagnosis N39.46 (ICD-10-CM) - Mixed urge and stress incontinence    Referring Provider (PT)  Inda Coke, PA      Precautions   Precautions None      Balance Screen   Has the patient fallen in the past 6 months No      [place] residence    Living Arrangements Spouse/significant other;Children   daughter in college     Prior Function   Level of Independence Independent    Leisure sells plants; read      Cognition   Overall Cognitive Status Within Functional Limits for tasks assessed      Posture/Postural Control   Posture/Postural Control Postural limitations    Postural Limitations Rounded Shoulders;Increased lumbar lordosis   Rt foot more pronation     ROM / Strength   AROM / PROM / Strength AROM;PROM;Strength      PROM   Overall PROM Comments Lt hip IR 50%; Rt/Lt ER 50%      Strength   Overall Strength Comments hip abduction 4/5; core strengh 4/5 based on stability of pelvis and ASLR improved with compression      Flexibility   Soft Tissue Assessment /Muscle Length yes    Hamstrings Rt  70%; Lt 80%      Palpation   SI assessment  Rt ilium anterior rotation    Palpation comment palpated pelvic floor outside clothing 4 quick flicks in 10 , 8 sec hold;      Special Tests   Other special tests ASLR improved with compression      Ambulation/Gait   Gait Pattern Within Functional Limits                      Objective measurements completed on examination: See above findings.     Pelvic Floor Special Questions - 11/06/20 0001    Are you Pregnant or attempting pregnancy? No    Prior Pregnancies Yes    Number of C-Sections 1    Urinary urgency Yes    Fluid intake ice tea 4-5 glasses                    PT Education - 11/06/20 0915    Education Details Access Code: [email or web address]; drinking water; kegel 5x/day 10 reps; 5 reps of 5 sec hold    Person(s) Educated Patient    Methods Explanation;Demonstration;Handout;Verbal cues;Tactile cues    Comprehension Verbalized understanding;Returned  demonstration               PT Long Term Goals - 11/06/20 1040      PT LONG TERM GOAL #1   Title Pt will be ind with HEP for maintaining strength gains    Time 8    Period Weeks    Status New    Target Date 01/01/21      PT LONG TERM GOAL #2   Title Pt will report she does not need to know where the toilet is at all times    Time 8    Period Weeks    Status New    Target Date 01/01/21      PT LONG TERM GOAL #3   Title Pt will be able to feel the urge to void and have at least 30 minutes to get there without leakage    Time 8    Period Weeks    Status New    Target Date 01/01/21      PT LONG TERM GOAL #4   Title Pt will be consistantly drinking 3 glasses of water in addition to her previous baseline    Time 8    Period Weeks    Status New    Target Date 01/01/21      PT LONG TERM GOAL #5   Title Pt will be able to sustain pelvic floor contraction for at least 20 seconds for improved endurance and urge control    Time 8    Period Weeks    Status New    Target Date 01/01/21                  Plan - 11/06/20 1048    Clinical Impression Statement Pt presents to clinic due to worsening leakage and urgency. Pt declined internal assessment today due to her cycle phase being in menstruation. Pt does present with factors that may lead to increased urge and leakage including abdominal weakness as assessed with ASLR and pelvic instability during MMT.  Pt has endurance of pelvic floor assess outside of clothing today and she is able to perform 4 quick flick and holding for 8 seconds which is low relative to normal ranges.  Pt has rounded shoulders and increased lumbar lordosis.  She has some decreased ROM and strength in bilateral hips as mentioned above.  Pt will benefit from skilled PT to address these impairments.    Personal Factors and Comorbidities Comorbidity 1    Comorbidities c-section    Examination-Participation Restrictions Community Activity     Stability/Clinical Decision Making Evolving/Moderate complexity    Clinical Decision Making Low    Rehab Potential Excellent    PT Frequency 1x / week    PT Duration 8 weeks    PT Treatment/Interventions ADLs/Self Care Home Management;Biofeedback;Cryotherapy;Therapeutic exercise;Neuromuscular re-education;Therapeutic activities;Patient/family education;Manual techniques;Dry needling;Passive range of motion;Taping    PT Next Visit Plan urge techiques, f/u on initial kegel and stretches; progress as able , internal assess as able    PT Home Exercise Plan Access Code: [email or web address]    Consulted and Agree with Plan of Care Patient           Patient will benefit from skilled therapeutic intervention in order to improve the following deficits and impairments:  Impaired flexibility,Decreased range of motion,Increased fascial restricitons,Postural dysfunction,Decreased endurance,Decreased strength  Visit Diagnosis: Muscle weakness (generalized)  Abnormal posture     Problem List Patient Active Problem List   Diagnosis Date Noted  . Excessive daytime sleepiness 10/30/2020  . Snoring 10/30/2020  . Nocturia more than twice per night 10/30/2020  . ADHD, adult residual type 10/30/2020  . 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 Obsessive compulsive disorder 09/08/2018  . Bipolar 1 disorder ([place]) 08/26/2018  . Obesity 04/08/2018  . Essential hypertension 04/08/2018  . PTSD (post-traumatic stress disorder)   . Osteoarthritis   . Migraines   . GERD (gastroesophageal reflux disease)   . 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 Bulging lumbar disc     Jule Ser, PT 11/06/2020, 11:01 AM   [facility] [name] [place], [place], [address] Phone: [phone]   Fax:  [phone]  Name: SOUNDRA LAMPLEY MRN: [id number] Date of Birth: 22-Dec-1975

## 2020-11-06 NOTE — Patient Instructions (Addendum)
° °  Pelvic floor contraction (kegels) - 5x/day 10x quick and then 5x for 5 seconds - can do lying down, sitting, standing  Drink more water - 10 glasses/day  [facility] [address], [place],  [address] Phone # [phone] Fax [phone]  Access Code: [email or web address] URL: [email or web address] Date: 11/06/2020 Prepared by: Jari Favre  Exercises Supine Hip Internal and External Rotation - 1 x daily - 7 x weekly - 10 reps - 1 sets - 5 sec hold Supine Hip External Rotation Stretch - 1 x daily - 7 x weekly - 1 sets - 3 reps - 30 sec hold Supine Hamstring Stretch - 1 x daily - 7 x weekly - 3 reps - 1 sets - 30 sec hold

## 2020-11-08 ENCOUNTER — Ambulatory Visit (INDEPENDENT_AMBULATORY_CARE_PROVIDER_SITE_OTHER): Payer: Medicare Other | Admitting: Psychology

## 2020-11-08 DIAGNOSIS — F331 Major depressive disorder, recurrent, moderate: Secondary | ICD-10-CM | POA: Diagnosis not present

## 2020-11-14 ENCOUNTER — Encounter: Payer: Self-pay | Admitting: Physician Assistant

## 2020-11-14 ENCOUNTER — Other Ambulatory Visit: Payer: Self-pay

## 2020-11-14 ENCOUNTER — Ambulatory Visit (INDEPENDENT_AMBULATORY_CARE_PROVIDER_SITE_OTHER): Payer: Medicare Other | Admitting: Physician Assistant

## 2020-11-14 ENCOUNTER — Ambulatory Visit (INDEPENDENT_AMBULATORY_CARE_PROVIDER_SITE_OTHER): Payer: Medicare Other | Admitting: Psychology

## 2020-11-14 VITALS — BP 110/80 | HR 71 | Temp 98.0°F | Ht 67.0 in | Wt 255.2 lb

## 2020-11-14 DIAGNOSIS — R21 Rash and other nonspecific skin eruption: Secondary | ICD-10-CM | POA: Diagnosis not present

## 2020-11-14 DIAGNOSIS — F331 Major depressive disorder, recurrent, moderate: Secondary | ICD-10-CM | POA: Diagnosis not present

## 2020-11-14 LAB — SEDIMENTATION RATE: Sed Rate: 63 mm/hr — ABNORMAL HIGH (ref 0–20)

## 2020-11-14 LAB — CBC WITH DIFFERENTIAL/PLATELET
Basophils Absolute: 0.1 10*3/uL (ref 0.0–0.1)
Basophils Relative: 1 % (ref 0.0–3.0)
Eosinophils Absolute: 0.4 10*3/uL (ref 0.0–0.7)
Eosinophils Relative: 3.5 % (ref 0.0–5.0)
HCT: 37 % (ref 36.0–46.0)
Hemoglobin: 12.2 g/dL (ref 12.0–15.0)
Lymphocytes Relative: 21.4 % (ref 12.0–46.0)
Lymphs Abs: 2.1 10*3/uL (ref 0.7–4.0)
MCHC: 32.9 g/dL (ref 30.0–36.0)
MCV: 86.9 fl (ref 78.0–100.0)
Monocytes Absolute: 0.5 10*3/uL (ref 0.1–1.0)
Monocytes Relative: 5.5 % (ref 3.0–12.0)
Neutro Abs: 6.8 10*3/uL (ref 1.4–7.7)
Neutrophils Relative %: 68.6 % (ref 43.0–77.0)
Platelets: 498 10*3/uL — ABNORMAL HIGH (ref 150.0–400.0)
RBC: 4.26 Mil/uL (ref 3.87–5.11)
RDW: 15.8 % — ABNORMAL HIGH (ref 11.5–15.5)
WBC: 10 10*3/uL (ref 4.0–10.5)

## 2020-11-14 LAB — COMPREHENSIVE METABOLIC PANEL
ALT: 20 U/L (ref 0–35)
AST: 15 U/L (ref 0–37)
Albumin: 3.8 g/dL (ref 3.5–5.2)
Alkaline Phosphatase: 77 U/L (ref 39–117)
BUN: 9 mg/dL (ref 6–23)
CO2: 29 mEq/L (ref 19–32)
Calcium: 9 mg/dL (ref 8.4–10.5)
Chloride: 101 mEq/L (ref 96–112)
Creatinine, Ser: 1.13 mg/dL (ref 0.40–1.20)
GFR: 58.93 mL/min — ABNORMAL LOW (ref >60.00)
Glucose, Bld: 86 mg/dL (ref 70–99)
Potassium: 4 mEq/L (ref 3.5–5.1)
Sodium: 134 mEq/L — ABNORMAL LOW (ref 135–145)
Total Bilirubin: 0.3 mg/dL (ref 0.2–1.2)
Total Protein: 7.3 g/dL (ref 6.0–8.3)

## 2020-11-14 LAB — C-REACTIVE PROTEIN: CRP: 4.2 mg/dL (ref 0.5–20.0)

## 2020-11-14 NOTE — Progress Notes (Signed)
Crystal Bean is a 44 y.o. female here for new problem.  I acted as a Education administrator for [employer], PA-C Anselmo Pickler, LPN   History of Present Illness:   Chief Complaint  Patient presents with  . Wrist Pain         HPI    Wrist pain Pt c/o pain and swelling left wrist x 2 weeks, increase in swelling over the weekend. Also small knots in wrist area. Pt is using Diclofenac 75 mg BID. Has slight itching. Also has a scar on her L lateral leg with slight redness that normally is not red/swollen.  Denies: new soaps, detergents, recent flu-like symptoms, fever, chills, n/v/d, sick contacts   Past Medical History:  Diagnosis Date  . ADHD (attention deficit hyperactivity disorder)   . Anxiety   . B12 deficiency   . Bipolar 1 disorder ([place])   . Bulging lumbar disc   . C. difficile diarrhea 04/24/2016   treated with metronidazole  . Depression   . GERD (gastroesophageal reflux disease)   . History of chicken pox   . History of shingles 2013  . Hypertension   . Migraines   . Obsessive-compulsive disorder   . Osteoarthritis   . PTSD (post-traumatic stress disorder)      Social History   Tobacco Use  . Smoking status: Former Smoker    Packs/day: 1.00    Years: 20.00    Pack years: 20.00    Quit date: 06/19/2011    Years since quitting: 9.4  . Smokeless tobacco: Never Used  Vaping Use  . Vaping Use: Never used  Substance Use Topics  . Alcohol use: Not Currently  . Drug use: No    Past Surgical History:  Procedure Laterality Date  . BLADDER SURGERY  1981   Urethera stretched  . caps on teeth  1980   Caps on all Teeth  . CESAREAN SECTION  2003  . TONSILLECTOMY      Family History  Problem Relation Age of Onset  . Osteoporosis Mother   . Hearing loss Father   . Healthy Sister   . CVA Maternal Grandmother   . Heart disease Maternal Grandmother   . Arthritis Paternal Grandmother   . Hearing loss Paternal Grandmother   . Dementia Paternal Grandmother   .  Dementia Paternal Grandfather   . Eczema Daughter     Allergies  Allergen Reactions  . Meloxicam Rash  . Penicillins Rash    Current Medications:   Current Outpatient Medications:  .  ALPRAZolam (XANAX) 1 MG tablet, Take 0.5-1 tablets (0.5-1 mg total) by mouth 2 (two) times daily as needed for anxiety. Rare, Disp: 60 tablet, Rfl: 0 .  [START ON 11/26/2020] amphetamine-dextroamphetamine (ADDERALL XR) 10 MG 24 hr capsule, Take 1 capsule (10 mg total) by mouth daily., Disp: 30 capsule, Rfl: 0 .  amphetamine-dextroamphetamine (ADDERALL XR) 10 MG 24 hr capsule, Take 1 capsule (10 mg total) by mouth daily., Disp: 30 capsule, Rfl: 0 .  amphetamine-dextroamphetamine (ADDERALL XR) 10 MG 24 hr capsule, Take 1 capsule (10 mg total) by mouth daily., Disp: 30 capsule, Rfl: 0 .  ARIPiprazole (ABILIFY) 10 MG tablet, TAKE ONE TABLET BY MOUTH DAILY, Disp: 90 tablet, Rfl: 0 .  aspirin 81 MG chewable tablet, Chew by mouth daily., Disp: , Rfl:  .  buPROPion (WELLBUTRIN XL) 150 MG 24 hr tablet, Take 3 tablets (450 mg total) by mouth daily., Disp: 270 tablet, Rfl: 1 .  desonide (DESOWEN) 0.05 % cream,  Apply topically 2 (two) times daily., Disp: 30 g, Rfl: 0 .  diclofenac (VOLTAREN) 75 MG EC tablet, Take 1 tablet (75 mg total) by mouth 2 (two) times daily., Disp: 60 tablet, Rfl: 0 .  esomeprazole (NEXIUM) 20 MG capsule, Take 20 mg by mouth daily at 12 noon., Disp: , Rfl:  .  gabapentin (NEURONTIN) 100 MG capsule, TAKE ONE CAPSULE BY MOUTH DAILY - MAY INCREASE TO THREE CAPSULES DAILY AS TOLERATED, Disp: 90 capsule, Rfl: 0 .  losartan (COZAAR) 50 MG tablet, Take 1 tablet by mouth once daily, Disp: 90 tablet, Rfl: 0 .  metFORMIN (GLUCOPHAGE-XR) 500 MG 24 hr tablet, Take 2 tablets (1,000 mg total) by mouth daily with breakfast., Disp: 60 tablet, Rfl: 3 .  Multiple Vitamin (MULTIVITAMIN) tablet, Take 1 tablet by mouth daily., Disp: , Rfl:  .  nystatin-triamcinolone ointment (MYCOLOG), Apply 1 application topically 2  (two) times daily., Disp: 30 g, Rfl: 0 .  TRI-SPRINTEC 0.18/0.215/0.25 MG-35 MCG tablet, Take 1 tablet by mouth once daily, Disp: 84 tablet, Rfl: 2 .  venlafaxine XR (EFFEXOR-XR) 75 MG 24 hr capsule, TAKE THREE CAPSULES BY MOUTH EVERY MORNING WITH BREAKFAST, Disp: 270 capsule, Rfl: 1   Review of Systems:   ROS  Negative unless otherwise specified per HPI.  Vitals:   Vitals:   11/14/20 1007  BP: 110/80  Pulse: 71  Temp: 98 F (36.7 C)  TempSrc: Temporal  SpO2: 98%  Weight: 255 lb 4 oz (115.8 kg)  Height: 5\' 7"  (1.702 m)     Body mass index is 39.98 kg/m.  Physical Exam:   Physical Exam Constitutional:      Appearance: She is well-developed and well-nourished.  HENT:     Head: Normocephalic and atraumatic.  Eyes:     Extraocular Movements: EOM normal.     Conjunctiva/sclera: Conjunctivae normal.  Pulmonary:     Effort: Pulmonary effort is normal.  Musculoskeletal:        General: Normal range of motion.     Cervical back: Normal range of motion and neck supple.  Skin:    General: Skin is warm and dry.     Comments: L anterior wrist swelling and erythema Scattered erythematous nodules on bilateral anterior forearms  Linear scar on L lateral lower leg with erythema and mild swelling  Neurological:     Mental Status: She is alert and oriented to person, place, and time.  Psychiatric:        Mood and Affect: Mood and affect normal.        Behavior: Behavior normal.        Thought Content: Thought content normal.        Judgment: Judgment normal.      Assessment and Plan:   [name] was seen today for wrist pain.  Diagnoses and all orders for this visit:  Rash and nonspecific skin eruption Also reviewed patient with Dr. Jerline Pain who evaluated patient in person briefly. Unclear etiology of symptoms. DDx includes dermatitis, infectious rash, autoimmune issue, etc. Will obtain labs and autoimmune testing for further. Defer treatment (such as abx, if indicated)  based on results of blood work and if develops any systemic symptoms. Worsening precautions advised. -     CBC with Differential/Platelet -     Comprehensive metabolic panel -     C-reactive protein -     ANA -     Rheumatoid factor -     Sedimentation rate  CMA or LPN served as Education administrator during  this visit. History, Physical, and Plan performed by medical provider. The above documentation has been reviewed and is accurate and complete.  Jarold Motto, PA-C

## 2020-11-14 NOTE — Patient Instructions (Signed)
It was great to see you!  Let's update blood work-up.  I will be in touch with the results.  Let me know if symptoms worsen in the meantime while we try to figure this out.  Take care,  Jarold Motto PA-C

## 2020-11-15 ENCOUNTER — Encounter: Payer: Self-pay | Admitting: Physician Assistant

## 2020-11-15 ENCOUNTER — Ambulatory Visit (INDEPENDENT_AMBULATORY_CARE_PROVIDER_SITE_OTHER): Payer: Medicare Other | Admitting: Physician Assistant

## 2020-11-15 DIAGNOSIS — F902 Attention-deficit hyperactivity disorder, combined type: Secondary | ICD-10-CM | POA: Diagnosis not present

## 2020-11-15 DIAGNOSIS — F331 Major depressive disorder, recurrent, moderate: Secondary | ICD-10-CM

## 2020-11-15 DIAGNOSIS — F4010 Social phobia, unspecified: Secondary | ICD-10-CM

## 2020-11-15 DIAGNOSIS — F411 Generalized anxiety disorder: Secondary | ICD-10-CM

## 2020-11-15 MED ORDER — ALPRAZOLAM 1 MG PO TABS: 0.5000 mg | ORAL_TABLET | Freq: Two times a day (BID) | ORAL | 0 refills | Status: DC | PRN

## 2020-11-15 MED ORDER — VENLAFAXINE HCL ER 150 MG PO CP24: 300.0000 mg | ORAL_CAPSULE | Freq: Every day | ORAL | 1 refills | Status: DC

## 2020-11-15 NOTE — Progress Notes (Signed)
[facility] Check  Patient ID: EME LARIVEE,  MRN: [id number]  PCP: Inda Coke, PA  Date of Evaluation: 11/15/2020 Time spent:40 minutes  Chief Complaint:  Chief Complaint    Anxiety; ADD; Depression; Follow-up      HISTORY/CURRENT STATUS: For routine med check.  At [facility], we increased Wellbutrin. Wasn't able to tolerate it, "made me feel bad. I can't explain it. But I went back down to 300 mg and I felt better."  We started the Wellbutrin originally to help with sexual dysfunction but it hasn't helped at all.   Still feels really sad sometimes, not sleeping too much or too little, but does wake up 2-3 times to pee. She is in PT to help with pelvic floor strengthening. Is able to enjoy things sometimes, she looks for fun things to do, but it's harder than it used to be. Energy and motivation are fair to good, depends on the situation. Appetite and weight are stable. Not isolating. Not crying easily. No SI/HI.  Anxiety is still a problem, more generailzed than PA. Gets irritated easily and nervous when things aren't going the way she thinks they should.   The Adderall is working well.  She is able to focus and finish tasks better than when she was not on the stimulant.  Patient denies increased energy with decreased need for sleep, no increased talkativeness, no racing thoughts, no impulsivity or risky behaviors, no increased spending, no increased libido, no grandiosity, no increased irritability or anger, no paranoia and no hallucinations.  Denies dizziness, syncope, seizures, numbness, tingling, tremor, tics, unsteady gait, slurred speech, confusion. Denies muscle or joint pain, stiffness, or dystonia. Denies unexplained weight loss, frequent infections, or sores that heal slowly.  No polyphagia, polydipsia, or polyuria. Denies visual changes or paresthesias.   Individual Medical History/ Review of Systems: Changes? :Yes    Will get sleep study per neurology on Jan 5th.   Nodule on left wrist popped up recently and she has a couple of other nodules on the other arm.  She saw her PCP about this yesterday.  Labs were drawn and results are pending.  Past medications for mental health diagnoses include: Prozac, Celexa, Lexapro, Wellbutrin, Effexor XR, Pristiq, Abilify,  Adderall, Ritalin, Klonopin, Xanax, Gabapentin, Buspar  Allergies: Meloxicam and Penicillins  Current Medications:  Current Outpatient Medications:  .  [START ON 11/26/2020] amphetamine-dextroamphetamine (ADDERALL XR) 10 MG 24 hr capsule, Take 1 capsule (10 mg total) by mouth daily., Disp: 30 capsule, Rfl: 0 .  amphetamine-dextroamphetamine (ADDERALL XR) 10 MG 24 hr capsule, Take 1 capsule (10 mg total) by mouth daily., Disp: 30 capsule, Rfl: 0 .  amphetamine-dextroamphetamine (ADDERALL XR) 10 MG 24 hr capsule, Take 1 capsule (10 mg total) by mouth daily., Disp: 30 capsule, Rfl: 0 .  ARIPiprazole (ABILIFY) 10 MG tablet, TAKE ONE TABLET BY MOUTH DAILY, Disp: 90 tablet, Rfl: 0 .  aspirin 81 MG chewable tablet, Chew by mouth daily., Disp: , Rfl:  .  buPROPion (WELLBUTRIN XL) 150 MG 24 hr tablet, Take 3 tablets (450 mg total) by mouth daily. (Patient taking differently: Take 300 mg by mouth daily.), Disp: 270 tablet, Rfl: 1 .  desonide (DESOWEN) 0.05 % cream, Apply topically 2 (two) times daily., Disp: 30 g, Rfl: 0 .  diclofenac (VOLTAREN) 75 MG EC tablet, Take 1 tablet (75 mg total) by mouth 2 (two) times daily., Disp: 60 tablet, Rfl: 0 .  esomeprazole (NEXIUM) 20 MG capsule, Take 20 mg by mouth daily at 12  noon., Disp: , Rfl:  .  gabapentin (NEURONTIN) 100 MG capsule, TAKE ONE CAPSULE BY MOUTH DAILY - MAY INCREASE TO THREE CAPSULES DAILY AS TOLERATED, Disp: 90 capsule, Rfl: 0 .  losartan (COZAAR) 50 MG tablet, Take 1 tablet by mouth once daily, Disp: 90 tablet, Rfl: 0 .  metFORMIN (GLUCOPHAGE-XR) 500 MG 24 hr tablet, Take 2 tablets (1,000 mg total) by mouth daily with breakfast., Disp: 60 tablet, Rfl:  3 .  Multiple Vitamin (MULTIVITAMIN) tablet, Take 1 tablet by mouth daily., Disp: , Rfl:  .  nystatin-triamcinolone ointment (MYCOLOG), Apply 1 application topically 2 (two) times daily., Disp: 30 g, Rfl: 0 .  TRI-SPRINTEC 0.18/0.215/0.25 MG-35 MCG tablet, Take 1 tablet by mouth once daily, Disp: 84 tablet, Rfl: 2 .  venlafaxine XR (EFFEXOR-XR) 150 MG 24 hr capsule, Take 2 capsules (300 mg total) by mouth daily with breakfast., Disp: 180 capsule, Rfl: 1 .  ALPRAZolam (XANAX) 1 MG tablet, Take 0.5-1 tablets (0.5-1 mg total) by mouth 2 (two) times daily as needed for anxiety. Rare, Disp: 60 tablet, Rfl: 0 Medication Side Effects: sexual dysfunction  Family Medical/ Social History: Changes? Daughter is home from college, with her girlfriend, and that's been a little stressful.   MENTAL HEALTH EXAM:  Last menstrual period 11/06/2020.There is no height or weight on file to calculate BMI.  General Appearance: Casual, Neat, Well Groomed and Obese  Eye Contact:  Good  Speech:  Clear and Coherent and Normal Rate  Volume:  Normal  Mood:  Euthymic  Affect:  Appropriate  Thought Process:  Goal Directed and Descriptions of Associations: Intact  Orientation:  Full (Time, Place, and Person)  Thought Content: Logical   Suicidal Thoughts:  No  Homicidal Thoughts:  No  Memory:  WNL  Judgement:  Good  Insight:  Good  Psychomotor Activity:  Normal  Concentration:  Concentration: Good and Attention Span: Good  Recall:  Good  Fund of Knowledge: Good  Language: Good  Assets:  Desire for Improvement  ADL's:  Intact  Cognition: WNL  Prognosis:  Good   Most pertinent recent labs: 09/20/2020  Hgb A1C 5.7 11/14/2020 CMP  Glucose 86, Sodium 134, LFTs nl  Last LP 09/2019  11/14/2020 sed rate was 63.  I did not disclose that information to the patient.  DIAGNOSES:    ICD-10-CM   1. Major depressive disorder, recurrent episode, moderate (HCC)  F33.1   2. Social phobia  F40.10   3. Generalized  anxiety disorder  F41.1   4. Attention deficit hyperactivity disorder (ADHD), combined type  F90.2     Receiving Psychotherapy: Yes   Colen Darling, LCSW.    RECOMMENDATIONS:  PDMP was reviewed. I provided 40 minutes of face-to-face time during this encounter, in which we discussed other treatment options for depression, that intolerable side effect that she had from increasing the Wellbutrin, recent labs, the nodules that she now has with work-up pending, and counseling on Lamictal.  We also discussed her recent labs.  I see that her sed rate was elevated but I did not tell the patient about that or any other labs that were drawn yesterday. For now, I would like for her to stay on the same dose of Wellbutrin, just because I do not want to change too many things at once.  For the depression, I recommend increasing the Effexor.  I had originally thought about Lamictal and we discussed the benefits, risks, side effects including the possibility of a life-threatening rash.  But after  thinking it through, it is best to maximize the Effexor and hopefully we will not need to add Lamictal.  Another reason not to start Lamictal is the nodules that she has on upper extremities bilaterally.  If she were to have a rash, we would not be certain if it is from the Lamictal or some progression of what ever is causing the nodules. Continue Wellbutrin XL 300 mg, 1 p.o. every morning. Continue Adderall XR 10 mg, 1 p.o. every morning. Continue Abilify 10 mg, 1 p.o. every morning. Continue gabapentin 100 mg, 1 p.o. twice daily. Increase Effexor XR to a total of 300 mg daily. Continue Xanax 1 mg, 1/2-1 daily as needed anxiety, use only for emergencies. Continue therapy. Return in 4 to 6 weeks.  Donnal Moat, PA-C

## 2020-11-16 ENCOUNTER — Encounter: Payer: Self-pay | Admitting: Physician Assistant

## 2020-11-16 ENCOUNTER — Ambulatory Visit: Payer: Medicare Other | Admitting: Physical Therapy

## 2020-11-16 LAB — ANA: Anti Nuclear Antibody (ANA): NEGATIVE

## 2020-11-16 LAB — RHEUMATOID FACTOR: Rheumatoid fact SerPl-aCnc: <14 IU/mL (ref <14)

## 2020-11-20 ENCOUNTER — Encounter: Payer: Self-pay | Admitting: Physical Therapy

## 2020-11-20 ENCOUNTER — Ambulatory Visit: Payer: Medicare Other | Attending: Physician Assistant | Admitting: Physical Therapy

## 2020-11-20 ENCOUNTER — Other Ambulatory Visit: Payer: Self-pay

## 2020-11-20 DIAGNOSIS — R293 Abnormal posture: Secondary | ICD-10-CM

## 2020-11-20 DIAGNOSIS — M6281 Muscle weakness (generalized): Secondary | ICD-10-CM | POA: Diagnosis present
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
## 2020-11-21 ENCOUNTER — Other Ambulatory Visit: Payer: Self-pay

## 2020-11-21 ENCOUNTER — Ambulatory Visit (INDEPENDENT_AMBULATORY_CARE_PROVIDER_SITE_OTHER): Payer: Medicare Other | Admitting: Physician Assistant

## 2020-11-21 ENCOUNTER — Other Ambulatory Visit (HOSPITAL_COMMUNITY)
Admission: RE | Admit: 2020-11-21 | Discharge: 2020-11-21 | Disposition: A | Discharge status: Home / Self Care | Payer: Medicare Other | Source: Ambulatory Visit | Attending: Physician Assistant | Admitting: Physician Assistant

## 2020-11-21 ENCOUNTER — Encounter: Payer: Self-pay | Admitting: Physician Assistant

## 2020-11-21 VITALS — BP 140/90 | HR 79 | Temp 97.9°F | Ht 67.0 in | Wt 252.5 lb

## 2020-11-21 DIAGNOSIS — R21 Rash and other nonspecific skin eruption: Secondary | ICD-10-CM | POA: Diagnosis present

## 2020-11-21 DIAGNOSIS — L309 Dermatitis, unspecified: Secondary | ICD-10-CM | POA: Diagnosis not present

## 2020-11-21 NOTE — Progress Notes (Signed)
Crystal Bean is a 45 y.o. female is here for follow up.  I acted as a Neurosurgeon for [employer], PA-C Corky Mull, LPN   History of Present Illness:   Chief Complaint  Patient presents with  . Cyst    HPI    Skin lesions Following up today from office visit on 11/14/20. Labs were completed on 11/16/20 and were essentially unremarkable.  Lesions have persisted and spread to R arm. Areas remain firm, red and tender.  We are having patient follow-up with [place] today so we can do a biopsy.   Health Maintenance Due  Topic Date Due  . Hepatitis C Screening  Never done  . HIV Screening  Never done  . COLONOSCOPY (Pts 45-85yrs Insurance coverage will need to be confirmed)  Never done    Past Medical History:  Diagnosis Date  . ADHD (attention deficit hyperactivity disorder)   . Anxiety   . B12 deficiency   . Bipolar 1 disorder (HCC)   . Bulging lumbar disc   . C. difficile diarrhea 04/24/2016   treated with metronidazole  . Depression   . GERD (gastroesophageal reflux disease)   . History of chicken pox   . History of shingles 2013  . Hypertension   . Migraines   . Obsessive-compulsive disorder   . Osteoarthritis   . PTSD (post-traumatic stress disorder)      Social History   Tobacco Use  . Smoking status: Former Smoker    Packs/day: 1.00    Years: 20.00    Pack years: 20.00    Quit date: 06/19/2011    Years since quitting: 9.4  . Smokeless tobacco: Never Used  Vaping Use  . Vaping Use: Never used  Substance Use Topics  . Alcohol use: Not Currently  . Drug use: No    Past Surgical History:  Procedure Laterality Date  . BLADDER SURGERY  1981   Urethera stretched  . caps on teeth  1980   Caps on all Teeth  . CESAREAN SECTION  2003  . TONSILLECTOMY      Family History  Problem Relation Age of Onset  . Osteoporosis Mother   . Hearing loss Father   . Healthy Sister   . CVA Maternal Grandmother   . Heart disease Maternal Grandmother   .  Arthritis Paternal Grandmother   . Hearing loss Paternal Grandmother   . Dementia Paternal Grandmother   . Dementia Paternal Grandfather   . Eczema Daughter     PMHx, SurgHx, SocialHx, FamHx, Medications, and Allergies were reviewed in the Visit Navigator and updated as appropriate.   Patient Active Problem List   Diagnosis Date Noted  . Excessive daytime sleepiness 10/30/2020  . Snoring 10/30/2020  . Nocturia more than twice per night 10/30/2020  . ADHD, adult residual type 10/30/2020  . 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 Obsessive compulsive disorder 09/08/2018  . Bipolar 1 disorder (HCC) 08/26/2018  . Obesity 04/08/2018  . Essential hypertension 04/08/2018  . PTSD (post-traumatic stress disorder)   . Osteoarthritis   . Migraines   . GERD (gastroesophageal reflux disease)   . Major depressive disorder, recurrent episode with mixed features (HCC)   . Bulging lumbar disc     Social History   Tobacco Use  . Smoking status: Former Smoker    Packs/day: 1.00    Years: 20.00    Pack years: 20.00    Quit date: 06/19/2011    Years since quitting: 9.4  . Smokeless tobacco: Never Used  Vaping Use  . Vaping Use: Never used  Substance Use Topics  . Alcohol use: Not Currently  . Drug use: No    Current Medications and Allergies:    Current Outpatient Medications:  .  ALPRAZolam (XANAX) 1 MG tablet, Take 0.5-1 tablets (0.5-1 mg total) by mouth 2 (two) times daily as needed for anxiety. Rare, Disp: 60 tablet, Rfl: 0 .  [START ON 11/26/2020] amphetamine-dextroamphetamine (ADDERALL XR) 10 MG 24 hr capsule, Take 1 capsule (10 mg total) by mouth daily., Disp: 30 capsule, Rfl: 0 .  amphetamine-dextroamphetamine (ADDERALL XR) 10 MG 24 hr capsule, Take 1 capsule (10 mg total) by mouth daily., Disp: 30 capsule, Rfl: 0 .  amphetamine-dextroamphetamine (ADDERALL XR) 10 MG 24 hr capsule, Take 1 capsule (10 mg total) by mouth daily., Disp: 30 capsule, Rfl: 0 .  ARIPiprazole (ABILIFY) 10 MG tablet,  TAKE ONE TABLET BY MOUTH DAILY, Disp: 90 tablet, Rfl: 0 .  aspirin 81 MG chewable tablet, Chew by mouth daily., Disp: , Rfl:  .  buPROPion (WELLBUTRIN XL) 150 MG 24 hr tablet, Take 3 tablets (450 mg total) by mouth daily. (Patient taking differently: Take 300 mg by mouth daily.), Disp: 270 tablet, Rfl: 1 .  desonide (DESOWEN) 0.05 % cream, Apply topically 2 (two) times daily., Disp: 30 g, Rfl: 0 .  diclofenac (VOLTAREN) 75 MG EC tablet, Take 1 tablet (75 mg total) by mouth 2 (two) times daily., Disp: 60 tablet, Rfl: 0 .  esomeprazole (NEXIUM) 20 MG capsule, Take 20 mg by mouth daily at 12 noon., Disp: , Rfl:  .  gabapentin (NEURONTIN) 100 MG capsule, TAKE ONE CAPSULE BY MOUTH DAILY - MAY INCREASE TO THREE CAPSULES DAILY AS TOLERATED, Disp: 90 capsule, Rfl: 0 .  losartan (COZAAR) 50 MG tablet, Take 1 tablet by mouth once daily, Disp: 90 tablet, Rfl: 0 .  metFORMIN (GLUCOPHAGE-XR) 500 MG 24 hr tablet, Take 2 tablets (1,000 mg total) by mouth daily with breakfast., Disp: 60 tablet, Rfl: 3 .  Multiple Vitamin (MULTIVITAMIN) tablet, Take 1 tablet by mouth daily., Disp: , Rfl:  .  nystatin-triamcinolone ointment (MYCOLOG), Apply 1 application topically 2 (two) times daily., Disp: 30 g, Rfl: 0 .  TRI-SPRINTEC 0.18/0.215/0.25 MG-35 MCG tablet, Take 1 tablet by mouth once daily, Disp: 84 tablet, Rfl: 2 .  venlafaxine XR (EFFEXOR-XR) 150 MG 24 hr capsule, Take 2 capsules (300 mg total) by mouth daily with breakfast., Disp: 180 capsule, Rfl: 1   Allergies  Allergen Reactions  . Meloxicam Rash  . Penicillins Rash    Review of Systems   ROS  Negative unless otherwise specified per HPI.  Vitals:   Vitals:   11/21/20 1052  BP: 140/90  Pulse: 79  Temp: 97.9 F (36.6 C)  TempSrc: Temporal  SpO2: 96%  Weight: 252 lb 8 oz (114.5 kg)  Height: 5\' 7"  (1.702 m)     Body mass index is 39.55 kg/m.   Physical Exam:    Physical Exam Constitutional:      Appearance: She is well-developed and  well-nourished.  HENT:     Head: Normocephalic and atraumatic.  Eyes:     Extraocular Movements: EOM normal.     Conjunctiva/sclera: Conjunctivae normal.  Pulmonary:     Effort: Pulmonary effort is normal.  Musculoskeletal:        General: Normal range of motion.     Cervical back: Normal range of motion and neck supple.  Skin:    General: Skin is warm and dry.  Comments: Scattered erythematous tender nodules on bilateral arms  Neurological:     Mental Status: She is alert and oriented to person, place, and time.  Psychiatric:        Mood and Affect: Mood and affect normal.        Behavior: Behavior normal.        Thought Content: Thought content normal.        Judgment: Judgment normal.    Punch biopsy Meds, vitals, and allergies reviewed.  Indication: suspicious lesion Location: L wrist Size: 3 inches Informed consent obtained.  Pt aware of risks not limited to but including infection, bleeding, damage to near by organs. Prep: etoh/betadine Anesthesia: 1%lidocaine with epi, good effect  Punch made with 4 mm punch  Minimal oozing, controlled with silver nitrate. Bacitracin and bandage applied.  Tolerated well      Assessment and Plan:    Crystal Bean was seen today for cyst.  Diagnoses and all orders for this visit:  Rash and nonspecific skin eruption -     Surgical pathology( [place]/ [facility])   Punch biopsy obtained. After care instructions provided. Follow-up based on results.  CMA or LPN served as scribe during this visit. History, Physical, and Plan performed by medical provider. The above documentation has been reviewed and is accurate and complete.  Inda Coke, PA-C [place] [place] 11/21/2020  Follow-up: No follow-ups on file.

## 2020-11-21 NOTE — Patient Instructions (Signed)
It was great to see you!  We will send your biopsy off to figure out what's going on! I will be in touch asap when the results come in.  Take care,  Jarold Motto PA-C

## 2020-11-22 ENCOUNTER — Other Ambulatory Visit: Payer: Self-pay

## 2020-11-22 ENCOUNTER — Ambulatory Visit (INDEPENDENT_AMBULATORY_CARE_PROVIDER_SITE_OTHER): Payer: Medicare Other | Admitting: Neurology

## 2020-11-22 DIAGNOSIS — R351 Nocturia: Secondary | ICD-10-CM

## 2020-11-22 DIAGNOSIS — F422 Mixed obsessional thoughts and acts: Secondary | ICD-10-CM

## 2020-11-22 DIAGNOSIS — F908 Attention-deficit hyperactivity disorder, other type: Secondary | ICD-10-CM

## 2020-11-22 DIAGNOSIS — G4733 Obstructive sleep apnea (adult) (pediatric): Secondary | ICD-10-CM

## 2020-11-22 DIAGNOSIS — R0683 Snoring: Secondary | ICD-10-CM

## 2020-11-22 DIAGNOSIS — G4719 Other hypersomnia: Secondary | ICD-10-CM

## 2020-11-23 ENCOUNTER — Ambulatory Visit (INDEPENDENT_AMBULATORY_CARE_PROVIDER_SITE_OTHER): Payer: Medicare Other | Admitting: Psychology

## 2020-11-23 DIAGNOSIS — F331 Major depressive disorder, recurrent, moderate: Secondary | ICD-10-CM | POA: Diagnosis not present

## 2020-11-24 ENCOUNTER — Other Ambulatory Visit: Payer: Self-pay | Admitting: Physician Assistant

## 2020-11-24 LAB — SURGICAL PATHOLOGY

## 2020-11-24 MED ORDER — TRIAMCINOLONE ACETONIDE 0.5 % EX OINT: TOPICAL_OINTMENT | CUTANEOUS | 2 refills | Status: DC

## 2020-11-27 ENCOUNTER — Encounter: Payer: Self-pay | Admitting: Physical Therapy

## 2020-11-27 ENCOUNTER — Other Ambulatory Visit: Payer: Self-pay

## 2020-11-27 ENCOUNTER — Ambulatory Visit: Payer: Medicare Other | Admitting: Physical Therapy

## 2020-11-27 DIAGNOSIS — M6281 Muscle weakness (generalized): Secondary | ICD-10-CM | POA: Diagnosis not present

## 2020-11-27 DIAGNOSIS — R293 Abnormal posture: Secondary | ICD-10-CM
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
## 2020-11-28 ENCOUNTER — Other Ambulatory Visit: Payer: Self-pay | Admitting: Physician Assistant

## 2020-11-30 ENCOUNTER — Other Ambulatory Visit: Payer: Self-pay | Admitting: Physician Assistant

## 2020-11-30 ENCOUNTER — Encounter: Payer: Self-pay | Admitting: Physician Assistant

## 2020-11-30 DIAGNOSIS — L309 Dermatitis, unspecified: Secondary | ICD-10-CM

## 2020-12-04 ENCOUNTER — Encounter: Payer: Medicare Other | Admitting: Physical Therapy

## 2020-12-05 ENCOUNTER — Ambulatory Visit (INDEPENDENT_AMBULATORY_CARE_PROVIDER_SITE_OTHER): Payer: Medicare Other | Admitting: Family Medicine

## 2020-12-07 NOTE — Progress Notes (Signed)
PATIENT'S NAME:  Crystal Bean, Crystal Bean DOB:      1976-04-07      MR#:    [id number]     DATE OF RECORDING: 11/22/2020 REFERRING M.D.:  Andrey Spearman, MD Study Performed:   Baseline Polysomnogram HISTORY:  Crystal Bean is a 45 - year- old Caucasian female patient and seen upon referral for a sleep consultation on 10/30/2020/ from Dr Leta Baptist, who follows her for memory loss.   Chief concern according to patient:   Crystal Bean that she was finally diagnosed at age 56 with ADD or ADHD which had affected her throughout her school career prior.  She reported her memory problems were addressed by Dr. Leta Baptist who is concerned that there could be an overlap with her more recent development of sleep problems.  Sleep problems and memory are very common in patients who are on stimulant medication. She Bean that she has memory loss for some dates in her life- for big lifetime events such as the birth of her first child or her wedding day, but she also has short term problems reflected in misplacing items or procrastination and having poor time management skills. She has a past medical history of ADHD (attention deficit hyperactivity disorder), Anxiety, Vitamin B12 deficiency, Bipolar 1 disorder ([place]), Bulging lumbar disc, C. difficile diarrhea (04/24/2016), Depression, GERD (gastroesophageal reflux disease), History of chicken pox, History of shingles (2013), Hypertension, Migraines, Obsessive-compulsive disorder, Osteoarthritis, and PTSD (post-traumatic stress disorder).  The patient never has had a sleep study -Sleep relevant: Nocturia 3-5 times, PTSD, and Tonsillectomy. No other ENT procedure, no TBi or cervical spine injury.  Family sleep history: both parents snore-, mother at age 32 with insomnia.  The patient endorsed the Epworth Sleepiness Scale at 15/24 points.   The patient's weight 257 pounds with a height of 67 (inches), resulting in a BMI of 40.5 kg/m2. The patient's neck circumference measured  16 inches.  CURRENT MEDICATIONS: Xanax, Adderall XR, Abilify, Aspirin, Wellbutrin, Desowen, Voltaren, Nexium, Neurontin, Microzide, Cozaar, Glucophage XR, Multivitamin, Mycolog, Tri-Sprintec, Effexor-XR  PROCEDURE:  This is a multichannel digital polysomnogram utilizing the Somnostar 11.2 system.  Electrodes and sensors were applied and monitored per AASM Specifications.   EEG, EOG, Chin and Limb EMG, were sampled at 200 Hz.  ECG, Snore and Nasal Pressure, Thermal Airflow, Respiratory Effort, CPAP Flow and Pressure, Oximetry was sampled at 50 Hz. Digital video and audio were recorded.      BASELINE STUDY: Lights Out was at 21:47 and Lights On at 04:59.  Total recording time (TRT) was 432.5 minutes, with a total sleep time (TST) of 329 minutes.   The patient's sleep latency was 14 minutes.  REM latency was 403.5 minutes.  The sleep efficiency was 76.1 %.     SLEEP ARCHITECTURE: WASO (Wake after sleep onset) was 95 minutes.  There were 18.5 minutes in Stage N1, 273 minutes Stage N2, 36.5 minutes Stage N3 and 1 minutes in Stage REM.  The percentage of Stage N1 was 5.6%, Stage N2 was 83.%, Stage N3 was 11.1% and Stage R (REM sleep) was 0.3%.    RESPIRATORY ANALYSIS:  There were a total of 38 respiratory events:  14 obstructive apneas, 1 central apnea and 0 mixed apneas with a total of 15 apneas and an apnea index (AI) of 2.7 /hour. There were 23 hypopneas with a hypopnea index of 4.2 /hour.  The total APNEA/HYPOPNEA INDEX (AHI) was 6.9/hour.  0 events occurred in REM sleep and 47 events in NREM.  The  REM AHI was 0 /hour, versus a non-REM AHI of 7.0/h. The patient spent 224 minutes of total sleep time in the supine position and 105 minutes in non-supine. The supine AHI was 6.1 versus a non-supine AHI of 8.6.  OXYGEN SATURATION & C02:  The Wake baseline 02 saturation was 96%, with the lowest being 87%. Time spent below 89% saturation equaled 6 minutes.  The arousals were noted as: 117 were spontaneous, 0  were associated with PLMs, 16 were associated with respiratory events. The patient had a total of 0 Periodic Limb Movements.   Audio and video analysis did not show any abnormal or unusual movements, behaviors, phonations or vocalizations.  Mild Snoring was noted. EKG was in keeping with normal sinus rhythm (NSR).   IMPRESSION:  1. There is evidence of mild Obstructive Sleep Apnea (OSA), independent of sleep position.  2. There were many spontaneous arousals, extremely little REM sleep. The total sleep time and sleep efficiency are reduced.    3. Snoring, mild degree.    RECOMMENDATIONS:  1. Advise to consider apnea treatment, but it is optional- Apnea is not the main cause of sleep fragmentation. To optimize apnea and snoring therapy, either by CPAP or dental device can be used. Weight loss will be most helpful.  2. Poor sleep quality is reflected in fragmented sleep, but a clear organic-physiological cause is not seen. PTSD/ OCD, Anxiety may be underlying causes, followed by mental health professional.  3. A follow up with the sleep clinic is not needed.   I certify that I have reviewed the entire raw data recording prior to the issuance of this report in accordance with the Standards of Accreditation of the American Academy of Sleep Medicine (AASM)   Larey Seat, MD Diplomat, American Board of Psychiatry and Neurology  Diplomat, American Board of Sleep Medicine Market researcher, [place] [facility] [employer]

## 2020-12-07 NOTE — Procedures (Signed)
No note

## 2020-12-08 ENCOUNTER — Other Ambulatory Visit: Payer: Self-pay | Admitting: Physician Assistant

## 2020-12-11 ENCOUNTER — Telehealth: Payer: Self-pay | Admitting: Neurology

## 2020-12-11 ENCOUNTER — Other Ambulatory Visit: Payer: Self-pay

## 2020-12-11 ENCOUNTER — Ambulatory Visit: Payer: Medicare Other | Admitting: Physical Therapy

## 2020-12-11 ENCOUNTER — Encounter: Payer: Self-pay | Admitting: Physical Therapy

## 2020-12-11 DIAGNOSIS — R293 Abnormal posture: Secondary | ICD-10-CM

## 2020-12-11 DIAGNOSIS — M6281 Muscle weakness (generalized): Secondary | ICD-10-CM | POA: Diagnosis not present

## 2020-12-11 NOTE — Telephone Encounter (Signed)
Called the patient and reviewed the sleep study results with her in detail. Informed her of the mild sleep apnea findings. Advised of Dr Dohmeier's recommendation's and the patient indicated that she is starting the healthy weight and wellness program. She is hoping that weight loss will be helpful with reducing the mild apnea concern. Pt verbalized understanding. Pt had no questions at this time but was encouraged to call back if questions arise. I have sent a copy of the sleep study in the mail for the patient
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
## 2020-12-12 ENCOUNTER — Encounter: Payer: Self-pay | Admitting: Physician Assistant

## 2020-12-19 ENCOUNTER — Ambulatory Visit (INDEPENDENT_AMBULATORY_CARE_PROVIDER_SITE_OTHER): Payer: Medicare Other | Admitting: Family Medicine

## 2020-12-19 ENCOUNTER — Other Ambulatory Visit: Payer: Self-pay

## 2020-12-19 ENCOUNTER — Ambulatory Visit: Payer: Medicare Other | Attending: Physician Assistant | Admitting: Physical Therapy

## 2020-12-19 ENCOUNTER — Encounter: Payer: Self-pay | Admitting: Physical Therapy

## 2020-12-19 DIAGNOSIS — M6281 Muscle weakness (generalized): Secondary | ICD-10-CM | POA: Insufficient documentation

## 2020-12-19 DIAGNOSIS — R293 Abnormal posture: Secondary | ICD-10-CM | POA: Insufficient documentation
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
## 2020-12-25 ENCOUNTER — Ambulatory Visit (INDEPENDENT_AMBULATORY_CARE_PROVIDER_SITE_OTHER): Payer: Medicare Other | Admitting: Psychology

## 2020-12-25 ENCOUNTER — Ambulatory Visit: Payer: Medicare Other | Admitting: Physician Assistant

## 2020-12-25 DIAGNOSIS — F331 Major depressive disorder, recurrent, moderate: Secondary | ICD-10-CM | POA: Diagnosis not present

## 2020-12-26 ENCOUNTER — Other Ambulatory Visit: Payer: Self-pay

## 2020-12-26 ENCOUNTER — Encounter: Payer: Self-pay | Admitting: Physician Assistant

## 2020-12-26 ENCOUNTER — Ambulatory Visit (INDEPENDENT_AMBULATORY_CARE_PROVIDER_SITE_OTHER): Payer: Medicare Other | Admitting: Physician Assistant

## 2020-12-26 VITALS — BP 120/82 | HR 74 | Temp 97.5°F | Resp 14 | Ht 67.0 in | Wt 252.0 lb

## 2020-12-26 DIAGNOSIS — I10 Essential (primary) hypertension: Secondary | ICD-10-CM | POA: Diagnosis not present

## 2020-12-26 DIAGNOSIS — L52 Erythema nodosum: Secondary | ICD-10-CM

## 2020-12-26 MED ORDER — NORETHINDRONE 0.35 MG PO TABS: 1.0000 | ORAL_TABLET | Freq: Every day | ORAL | 11 refills | Status: DC

## 2020-12-26 NOTE — Progress Notes (Signed)
Crystal Bean is a 45 y.o. female here for a new problem.  History of Present Illness:   Chief Complaint  Patient presents with  . Hypertension    Taking Losartan as directed.   . Obesity    Finally got scheduled with [facility]     HPI  Erythema nodosum Went to dermatology for work-up of her rash. 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 Was started on medrol dose pack and had resolution of symptoms. A few days after stopping this however, symptoms started to return and the dermatologist put her back on another medrol dose pack. Symptoms are currently well controlled. She is still on this medrol dose pack.  HTN Currently taking Losartan 50 mg. At home blood pressure readings are: not regularly checked. Patient denies chest pain, SOB, blurred vision, dizziness, unusual headaches, lower leg swelling. Patient is compliant with medication. Denies excessive caffeine intake, stimulant usage, excessive alcohol intake, or increase in salt consumption.  BP Readings from Last 3 Encounters:  12/26/20 120/82  11/21/20 140/90  11/14/20 110/80     Past Medical History:  Diagnosis Date  . ADHD (attention deficit hyperactivity disorder)   . Anxiety   . B12 deficiency   . Bipolar 1 disorder ([place])   . Bulging lumbar disc   . C. difficile diarrhea 04/24/2016   treated with metronidazole  . Depression   . GERD (gastroesophageal reflux disease)   . History of chicken pox   . History of shingles 2013  . Hypertension   . Migraines   . Obsessive-compulsive disorder   . Osteoarthritis   . PTSD (post-traumatic stress disorder)      Social History   Tobacco Use  . Smoking status: Former Smoker    Packs/day: 1.00    Years: 20.00    Pack years: 20.00    Quit date: 06/19/2011    Years since quitting: 9.5  . Smokeless tobacco: Never Used  Vaping Use  . Vaping Use: Never used  Substance Use Topics  . Alcohol use: Not Currently  . Drug use: No    Past Surgical  History:  Procedure Laterality Date  . BLADDER SURGERY  1981   Urethera stretched  . caps on teeth  1980   Caps on all Teeth  . CESAREAN SECTION  2003  . TONSILLECTOMY      Family History  Problem Relation Age of Onset  . Osteoporosis Mother   . Hearing loss Father   . Healthy Sister   . CVA Maternal Grandmother   . Heart disease Maternal Grandmother   . Arthritis Paternal Grandmother   . Hearing loss Paternal Grandmother   . Dementia Paternal Grandmother   . Dementia Paternal Grandfather   . Eczema Daughter     Allergies  Allergen Reactions  . Meloxicam Rash  . Penicillins Rash    Current Medications:   Current Outpatient Medications:  .  ALPRAZolam (XANAX) 1 MG tablet, Take 0.5-1 tablets (0.5-1 mg total) by mouth 2 (two) times daily as needed for anxiety. Rare, Disp: 60 tablet, Rfl: 0 .  amphetamine-dextroamphetamine (ADDERALL XR) 10 MG 24 hr capsule, Take 1 capsule (10 mg total) by mouth daily., Disp: 30 capsule, Rfl: 0 .  amphetamine-dextroamphetamine (ADDERALL XR) 10 MG 24 hr capsule, Take 1 capsule (10 mg total) by mouth daily., Disp: 30 capsule, Rfl: 0 .  amphetamine-dextroamphetamine (ADDERALL XR) 10 MG 24 hr capsule, Take 1 capsule (10 mg total) by mouth daily., Disp: 30 capsule, Rfl:  0 .  ARIPiprazole (ABILIFY) 10 MG tablet, TAKE ONE TABLET BY MOUTH DAILY, Disp: 90 tablet, Rfl: 3 .  aspirin 81 MG chewable tablet, Chew by mouth daily., Disp: , Rfl:  .  buPROPion (WELLBUTRIN XL) 150 MG 24 hr tablet, Take 3 tablets (450 mg total) by mouth daily. (Patient taking differently: Take 300 mg by mouth daily.), Disp: 270 tablet, Rfl: 1 .  desonide (DESOWEN) 0.05 % cream, Apply topically 2 (two) times daily., Disp: 30 g, Rfl: 0 .  diclofenac (VOLTAREN) 75 MG EC tablet, Take 1 tablet (75 mg total) by mouth 2 (two) times daily., Disp: 60 tablet, Rfl: 0 .  esomeprazole (NEXIUM) 20 MG capsule, Take 20 mg by mouth daily at 12 noon., Disp: , Rfl:  .  gabapentin (NEURONTIN) 100 MG  capsule, TAKE 1 CAPSULE(S) BY MOUTH DAILY . MAY INCRASE TO 3 CAPSULES DAILY AS TOLERATED, Disp: 90 capsule, Rfl: 0 .  losartan (COZAAR) 50 MG tablet, Take 1 tablet by mouth once daily, Disp: 90 tablet, Rfl: 0 .  metFORMIN (GLUCOPHAGE-XR) 500 MG 24 hr tablet, Take 2 tablets (1,000 mg total) by mouth daily with breakfast., Disp: 60 tablet, Rfl: 3 .  Multiple Vitamin (MULTIVITAMIN) tablet, Take 1 tablet by mouth daily., Disp: , Rfl:  .  norethindrone (ORTHO MICRONOR) 0.35 MG tablet, Take 1 tablet (0.35 mg total) by mouth daily., Disp: 28 tablet, Rfl: 11 .  nystatin-triamcinolone ointment (MYCOLOG), Apply 1 application topically 2 (two) times daily., Disp: 30 g, Rfl: 0 .  triamcinolone ointment (KENALOG) 0.5 %, Apply to affected area 1-2 times daily, Disp: 15 g, Rfl: 2 .  venlafaxine XR (EFFEXOR-XR) 150 MG 24 hr capsule, Take 2 capsules (300 mg total) by mouth daily with breakfast., Disp: 180 capsule, Rfl: 1   Review of Systems:   ROS  Negative unless otherwise specified per HPI.  Vitals:   Vitals:   12/26/20 0829  BP: 120/82  Pulse: 74  Resp: 14  Temp: (!) 97.5 F (36.4 C)  TempSrc: Temporal  SpO2: 99%  Weight: 252 lb (114.3 kg)  Height: 5\' 7"  (1.702 m)     Body mass index is 39.47 kg/m.  Physical Exam:   Physical Exam Vitals and nursing note reviewed.  Constitutional:      General: She is not in acute distress.    Appearance: She is well-developed. She is not ill-appearing, toxic-appearing or sickly-appearing.  Cardiovascular:     Rate and Rhythm: Normal rate and regular rhythm.     Pulses: Normal pulses.     Heart sounds: Normal heart sounds, S1 normal and S2 normal.     Comments: No LE edema Pulmonary:     Effort: Pulmonary effort is normal.     Breath sounds: Normal breath sounds.  Skin:    General: Skin is warm, dry and intact.  Neurological:     Mental Status: She is alert.     GCS: GCS eye subscore is 4. GCS verbal subscore is 5. GCS motor subscore is 6.   Psychiatric:        Mood and Affect: Mood and affect normal.        Speech: Speech normal.        Behavior: Behavior normal. Behavior is cooperative.     Assessment and Plan:   Crystal Bean was seen today for hypertension and obesity.  Diagnoses and all orders for this visit:  Erythema nodosum Management per derm. Symptoms currently controlled. Discussed trialing progesterone only pill vs condoms (had prior unpleasant  experience with IUD.) She is will to trial progesterone only pill to see if this helps prevent recurrence of rash and for continued pregnancy prevention. May need to switch to condoms only if rash persists after OCP change.  Essential hypertension Normotensive. Continue Losartan 50 mg daily. Follow-up in 9 mo to 1 year.  Other orders -     norethindrone (ORTHO MICRONOR) 0.35 MG tablet; Take 1 tablet (0.35 mg total) by mouth daily.  CMA or LPN served as scribe during this visit. History, Physical, and Plan performed by medical provider. The above documentation has been reviewed and is accurate and complete.  Inda Coke, PA-C

## 2020-12-26 NOTE — Patient Instructions (Signed)
It was great to see you!  Discontinue Tri-Sprintec Start progesterone only pill, this has been sent in  Let's follow-up in 9 months, sooner if you have concerns.  If a referral was placed today, you will be contacted for an appointment. Please note that routine referrals can sometimes take up to 3-4 weeks to process. Please call our office if you haven't heard anything after this time frame.  Take care,  Inda Coke PA-C

## 2020-12-28 ENCOUNTER — Other Ambulatory Visit: Payer: Self-pay | Admitting: Physician Assistant

## 2021-01-01 ENCOUNTER — Other Ambulatory Visit: Payer: Self-pay

## 2021-01-01 ENCOUNTER — Ambulatory Visit (INDEPENDENT_AMBULATORY_CARE_PROVIDER_SITE_OTHER): Payer: Medicare Other | Admitting: Psychology

## 2021-01-01 ENCOUNTER — Ambulatory Visit: Payer: Medicare Other | Admitting: Physical Therapy

## 2021-01-01 DIAGNOSIS — M6281 Muscle weakness (generalized): Secondary | ICD-10-CM | POA: Diagnosis not present

## 2021-01-01 DIAGNOSIS — F331 Major depressive disorder, recurrent, moderate: Secondary | ICD-10-CM | POA: Diagnosis not present

## 2021-01-01 DIAGNOSIS — R293 Abnormal posture: Secondary | ICD-10-CM

## 2021-01-01 NOTE — Therapy (Signed)
[facility] [address], [place], [place], [address] Phone: [phone]   Fax:  [phone]  Physical Therapy Treatment  Patient Details  Name: LABERTA WILBON MRN: [id number] Date of Birth: 04-01-1976 Referring Provider (PT): Inda Coke, [place]   Encounter Date: 01/01/2021   PT End of Session - 01/01/21 0947    Visit Number 6    Date for PT Re-Evaluation 01/01/21    PT Start Time 0933    PT Stop Time 1014    PT Time Calculation (min) 41 min    Activity Tolerance Patient tolerated treatment well    Behavior During Therapy [facility] for tasks assessed/performed           Past Medical History:  Diagnosis Date  . ADHD (attention deficit hyperactivity disorder)   . Anxiety   . B12 deficiency   . Bipolar 1 disorder ([place])   . Bulging lumbar disc   . C. difficile diarrhea 04/24/2016   treated with metronidazole  . Depression   . GERD (gastroesophageal reflux disease)   . History of chicken pox   . History of shingles 2013  . Hypertension   . Migraines   . Obsessive-compulsive disorder   . Osteoarthritis   . PTSD (post-traumatic stress disorder)     Past Surgical History:  Procedure Laterality Date  . BLADDER SURGERY  1981   Urethera stretched  . caps on teeth  1980   Caps on all Teeth  . CESAREAN SECTION  2003  . TONSILLECTOMY      There were no vitals filed for this visit.   Subjective Assessment - 01/01/21 1017    Subjective I feel like I have control of my bladder now    Patient Stated Goals be able to wait an hour    Currently in Pain? No/denies                             [facility] Adult PT Treatment/Exercise - 01/01/21 0001      Neuro Re-ed    Neuro Re-ed Details  TC to pelvic floor and TrA for contracting and coordinating together for brace during ex's      Lumbar Exercises: Stretches   Active Hamstring Stretch Right;Left;2 reps;20 seconds      Lumbar Exercises: Aerobic    Nustep L3 x 10 min with PT present for status and cues to activate core      Lumbar Exercises: Standing   Functional Squats 20 reps   cues for mechanics and holding band anti rotation   Lifting 15 reps    Lifting Weights (lbs) 2    Lifting Limitations standing on foam - exhale when lifting    Other Standing Lumbar Exercises standing on foam band pulls; hip ext, ab, march - 10x each      Lumbar Exercises: Sidelying   Hip Abduction Right;Left;10 reps                       PT Long Term Goals - 01/01/21 0940      PT LONG TERM GOAL #1   Title Pt will be ind with HEP for maintaining strength gains    Status Achieved      PT LONG TERM GOAL #2   Title Pt will report she does not need to know where the toilet is at all times    Baseline I don't feel limited at all  Status Achieved      PT LONG TERM GOAL #3   Title Pt will be able to feel the urge to void and have at least 30 minutes to get there without leakage    Baseline 30 minutes was okay and I made it, but was definitely the limit    Status Achieved      PT LONG TERM GOAL #4   Title Pt will be consistantly drinking 3 glasses of water in addition to her previous baseline    Baseline I have been drinking 2 bottles up from one bottle    Status Partially Met      PT LONG TERM GOAL #5   Title Pt will be able to sustain pelvic floor contraction for at least 20 seconds for improved endurance and urge control    Status Achieved                 Plan - 01/01/21 0952    Clinical Impression Statement Pt has met almost all goals related to pelvic floor issue as stated above. Pt has another referral for the low back and she will return to PT to address low back.  Pt is ind with her HEP for the pelvic floor and will discharge that diagnosis today    PT Treatment/Interventions ADLs/Self Care Home Management;Biofeedback;Cryotherapy;Therapeutic exercise;Neuromuscular re-education;Therapeutic activities;Patient/family  education;Manual techniques;Dry needling;Passive range of motion;Taping    PT Next Visit Plan d/c today    PT Home Exercise Plan Access Code: [email or web address]    Consulted and Agree with Plan of Care Patient           Patient will benefit from skilled therapeutic intervention in order to improve the following deficits and impairments:  Impaired flexibility,Decreased range of motion,Increased fascial restricitons,Postural dysfunction,Decreased endurance,Decreased strength  Visit Diagnosis: Muscle weakness (generalized)  Abnormal posture     Problem List Patient Active Problem List   Diagnosis Date Noted  . Erythema nodosum 12/26/2020  . Excessive daytime sleepiness 10/30/2020  . Snoring 10/30/2020  . Nocturia more than twice per night 10/30/2020  . ADHD, adult residual type 10/30/2020  . 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 Obsessive compulsive disorder 09/08/2018  . Bipolar 1 disorder ([place]) 08/26/2018  . Obesity 04/08/2018  . Essential hypertension 04/08/2018  . PTSD (post-traumatic stress disorder)   . Osteoarthritis   . Migraines   . GERD (gastroesophageal reflux disease)   . 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 Bulging lumbar disc     Jule Ser, PT 01/01/2021, 11:04 AM  [facility] [address], [place], [place], [address] Phone: [phone]   Fax:  [phone]  Name: AERICA RINCON MRN: [id number] Date of Birth: 16-Feb-1976  PHYSICAL THERAPY DISCHARGE SUMMARY  Visits from Start of Care: 6  Current functional level related to goals / functional outcomes: See above   Remaining deficits: See above   Education / Equipment: HEP  Plan: Patient agrees to discharge.  Patient goals were met. Patient is being discharged due to being pleased with the current functional level.  ?????    [employer], PT 01/01/21 11:05 AM

## 2021-01-02 ENCOUNTER — Encounter (INDEPENDENT_AMBULATORY_CARE_PROVIDER_SITE_OTHER): Payer: Self-pay | Admitting: Family Medicine

## 2021-01-02 ENCOUNTER — Ambulatory Visit (INDEPENDENT_AMBULATORY_CARE_PROVIDER_SITE_OTHER): Payer: Medicare Other | Admitting: Physician Assistant

## 2021-01-02 ENCOUNTER — Encounter: Payer: Self-pay | Admitting: Physician Assistant

## 2021-01-02 ENCOUNTER — Ambulatory Visit (INDEPENDENT_AMBULATORY_CARE_PROVIDER_SITE_OTHER): Payer: Medicare Other | Admitting: Family Medicine

## 2021-01-02 VITALS — BP 114/80 | HR 70 | Temp 98.1°F | Ht 66.0 in | Wt 252.0 lb

## 2021-01-02 DIAGNOSIS — M545 Low back pain, unspecified: Secondary | ICD-10-CM

## 2021-01-02 DIAGNOSIS — R7303 Prediabetes: Secondary | ICD-10-CM | POA: Diagnosis not present

## 2021-01-02 DIAGNOSIS — R5383 Other fatigue: Secondary | ICD-10-CM

## 2021-01-02 DIAGNOSIS — R0602 Shortness of breath: Secondary | ICD-10-CM | POA: Diagnosis not present

## 2021-01-02 DIAGNOSIS — F319 Bipolar disorder, unspecified: Secondary | ICD-10-CM

## 2021-01-02 DIAGNOSIS — I10 Essential (primary) hypertension: Secondary | ICD-10-CM

## 2021-01-02 DIAGNOSIS — Z1331 Encounter for screening for depression: Secondary | ICD-10-CM

## 2021-01-02 DIAGNOSIS — F902 Attention-deficit hyperactivity disorder, combined type: Secondary | ICD-10-CM | POA: Diagnosis not present

## 2021-01-02 DIAGNOSIS — Z6841 Body Mass Index (BMI) 40.0 and over, adult: Secondary | ICD-10-CM

## 2021-01-02 DIAGNOSIS — F422 Mixed obsessional thoughts and acts: Secondary | ICD-10-CM

## 2021-01-02 DIAGNOSIS — Z0289 Encounter for other administrative examinations: Secondary | ICD-10-CM

## 2021-01-02 DIAGNOSIS — G8929 Other chronic pain: Secondary | ICD-10-CM

## 2021-01-02 DIAGNOSIS — F411 Generalized anxiety disorder: Secondary | ICD-10-CM | POA: Diagnosis not present

## 2021-01-02 DIAGNOSIS — G4733 Obstructive sleep apnea (adult) (pediatric): Secondary | ICD-10-CM

## 2021-01-02 DIAGNOSIS — E559 Vitamin D deficiency, unspecified: Secondary | ICD-10-CM

## 2021-01-02 DIAGNOSIS — F4310 Post-traumatic stress disorder, unspecified: Secondary | ICD-10-CM | POA: Diagnosis not present

## 2021-01-02 DIAGNOSIS — F908 Attention-deficit hyperactivity disorder, other type: Secondary | ICD-10-CM

## 2021-01-02 DIAGNOSIS — E538 Deficiency of other specified B group vitamins: Secondary | ICD-10-CM

## 2021-01-02 DIAGNOSIS — E7849 Other hyperlipidemia: Secondary | ICD-10-CM

## 2021-01-02 MED ORDER — AMPHETAMINE-DEXTROAMPHET ER 10 MG PO CP24: 10.0000 mg | ORAL_CAPSULE | Freq: Two times a day (BID) | ORAL | 0 refills | Status: DC

## 2021-01-02 MED ORDER — BUPROPION HCL ER (XL) 300 MG PO TB24
300.0000 mg | ORAL_TABLET | Freq: Every day | ORAL | 1 refills | Status: DC
Start: 2021-01-02 — End: 2021-06-26

## 2021-01-02 NOTE — Progress Notes (Signed)
[facility] Med Check  Patient ID: Crystal Bean,  MRN: [id number]  PCP: Inda Coke, PA  Date of Evaluation: 01/02/2021 Time spent:40 minutes  Chief Complaint:  Chief Complaint    Anxiety; Depression; Follow-up      HISTORY/CURRENT STATUS: For routine med check.  As far as depression goes, she's better. Enjoys reading, watching certain shows on tv. Energy and motivation are pretty good. Not isolating. She visits her parents once a week and goes grocery shopping when needed. Not crying easily. Sleeps ok. Trouble focusing, Adderall isn't working as well. Not taking Xanax very often. Denies SI/HI.   Has been seen at [facility].  Is starting their program.  Had lots of labs done recently.  Patient denies increased energy with decreased need for sleep, no increased talkativeness, no racing thoughts, no impulsivity or risky behaviors, no increased spending, no increased libido, no grandiosity, no increased irritability or anger, no paranoia and no hallucinations.  Denies dizziness, syncope, seizures, numbness, tingling, tremor, tics, unsteady gait, slurred speech, confusion. Denies muscle or joint pain, stiffness, or dystonia. Denies unexplained weight loss, frequent infections, or sores that heal slowly.  No polyphagia, polydipsia, or polyuria. Denies visual changes or paresthesias.   Individual Medical History/ Review of Systems: Changes? :Yes   Started [facility] program today.  Had sleep study since LOV, only a few apnea events, no need for CPAP.   Past medications for mental health diagnoses include: Prozac, Celexa, Lexapro, Wellbutrin, Effexor XR, Pristiq, Abilify,  Adderall, Ritalin, Klonopin, Xanax, Gabapentin, Buspar  Allergies: Meloxicam and Penicillins  Current Medications:  Current Outpatient Medications:  .  ALPRAZolam (XANAX) 1 MG tablet, Take 0.5-1 tablets (0.5-1 mg total) by mouth 2 (two) times daily as needed for  anxiety. Rare, Disp: 60 tablet, Rfl: 0 .  amphetamine-dextroamphetamine (ADDERALL XR) 10 MG 24 hr capsule, Take 1 capsule (10 mg total) by mouth daily., Disp: 30 capsule, Rfl: 0 .  ARIPiprazole (ABILIFY) 10 MG tablet, TAKE ONE TABLET BY MOUTH DAILY, Disp: 90 tablet, Rfl: 3 .  aspirin 81 MG chewable tablet, Chew by mouth daily., Disp: , Rfl:  .  buPROPion (WELLBUTRIN XL) 300 MG 24 hr tablet, Take 1 tablet (300 mg total) by mouth daily., Disp: 90 tablet, Rfl: 1 .  Cholecalciferol (VITAMIN D3) 10 MCG (400 UNIT) CAPS, Take by mouth., Disp: , Rfl:  .  diclofenac (VOLTAREN) 75 MG EC tablet, Take 1 tablet (75 mg total) by mouth 2 (two) times daily., Disp: 60 tablet, Rfl: 0 .  esomeprazole (NEXIUM) 20 MG capsule, Take 20 mg by mouth daily at 12 noon., Disp: , Rfl:  .  gabapentin (NEURONTIN) 100 MG capsule, TAKE 1 CAPSULE(S) BY MOUTH DAILY . MAY INCRASE TO 3 CAPSULES DAILY AS TOLERATED, Disp: 90 capsule, Rfl: 0 .  losartan (COZAAR) 50 MG tablet, Take 1 tablet by mouth once daily, Disp: 90 tablet, Rfl: 0 .  Magnesium 250 MG TABS, Take by mouth., Disp: , Rfl:  .  metFORMIN (GLUCOPHAGE-XR) 500 MG 24 hr tablet, Take 2 tablets (1,000 mg total) by mouth daily with breakfast., Disp: 60 tablet, Rfl: 3 .  Multiple Vitamin (MULTIVITAMIN) tablet, Take 1 tablet by mouth daily., Disp: , Rfl:  .  Norethindrone (HEATHER PO), Take by mouth., Disp: , Rfl:  .  venlafaxine XR (EFFEXOR-XR) 150 MG 24 hr capsule, Take 2 capsules (300 mg total) by mouth daily with breakfast., Disp: 180 capsule, Rfl: 1 .  [START ON 01/29/2021] amphetamine-dextroamphetamine (ADDERALL XR)  10 MG 24 hr capsule, Take 1 capsule (10 mg total) by mouth 2 (two) times daily with breakfast and lunch., Disp: 60 capsule, Rfl: 0 .  amphetamine-dextroamphetamine (ADDERALL XR) 10 MG 24 hr capsule, Take 1 capsule (10 mg total) by mouth 2 (two) times daily with breakfast and lunch., Disp: 60 capsule, Rfl: 0 .  Norgestimate-Ethinyl Estradiol Triphasic 0.18/0.215/0.25  MG-35 MCG tablet, Take 1 tablet by mouth daily. (Patient not taking: Reported on 01/02/2021), Disp: , Rfl:  Medication Side Effects: sexual dysfunction  Family Medical/ Social History: Changes? no   MENTAL HEALTH EXAM:  Last menstrual period 12/25/2020.There is no height or weight on file to calculate BMI.  General Appearance: Casual, Neat, Well Groomed and Obese  Eye Contact:  Good  Speech:  Clear and Coherent and Normal Rate  Volume:  Normal  Mood:  Euthymic  Affect:  Appropriate  Thought Process:  Goal Directed and Descriptions of Associations: Intact  Orientation:  Full (Time, Place, and Person)  Thought Content: Logical   Suicidal Thoughts:  No  Homicidal Thoughts:  No  Memory:  WNL  Judgement:  Good  Insight:  Good  Psychomotor Activity:  Normal  Concentration:  Concentration: Good and Attention Span: Good  Recall:  Good  Fund of Knowledge: Good  Language: Good  Assets:  Desire for Improvement  ADL's:  Intact  Cognition: WNL  Prognosis:  Good   Most recent pertinent labs: Glucose was 91, creatinine was slightly elevated at 1.03. Hemoglobin A1c 5.9 Total cholesterol 227, triglycerides 243, HDL 50, LDL 134 Vitamin D 42.2 TSH 2.410.  Sleep study on 11/22/2020 showed poor sleep quality due to fragmented sleep but no clear organic physiological cause is evident.  CPAP is optional.  DIAGNOSES:    ICD-10-CM   1. Attention deficit hyperactivity disorder (ADHD), combined type  F90.2   2. Mixed obsessional thoughts and acts  F42.2   3. Generalized anxiety disorder  F41.1   4. PTSD (post-traumatic stress disorder)  F43.10   5. Bipolar I disorder ([place])  F31.9     Receiving Psychotherapy: Yes   Trey Paula, LCSW.    RECOMMENDATIONS:  PDMP was reviewed. I provided 40 minutes of face-to-face time during this encounter, in which we discussed the lab results and sleep study results.  We again briefly discussed sleep hygiene.  Cholesterol is slightly elevated but with new  diet and exercise program that can help.  I do not recommend getting off the Abilify due to these results. As far as focus and concentration goes I recommend increasing the Adderall XR.  Instead of months in the morning, will use twice daily.  She knows not to take it too late in the day or else that will cause sleep issues even further. Continue Xanax 1 mg, 1/2-1 p.o. twice daily.  She uses rarely. Continue Wellbutrin XL 300 mg, 1 p.o. every morning. Increase Adderall XR to 10 mg at breakfast and then repeat at lunch. Continue Abilify 10 mg, 1 p.o. every morning. Continue gabapentin 100 mg, 1 p.o. twice daily. Continue Effexor XR 300 mg daily. Continue therapy. Return in 6 weeks.  Donnal Moat, PA-C

## 2021-01-03 LAB — COMPREHENSIVE METABOLIC PANEL
ALT: 24 IU/L (ref 0–32)
AST: 16 IU/L (ref 0–40)
Albumin/Globulin Ratio: 1.5 (ref 1.2–2.2)
Albumin: 4.2 g/dL (ref 3.8–4.8)
Alkaline Phosphatase: 89 IU/L (ref 44–121)
BUN/Creatinine Ratio: 10 (ref 9–23)
BUN: 10 mg/dL (ref 6–24)
Bilirubin Total: 0.2 mg/dL (ref 0.0–1.2)
CO2: 25 mmol/L (ref 20–29)
Calcium: 9.5 mg/dL (ref 8.7–10.2)
Chloride: 95 mmol/L — ABNORMAL LOW (ref 96–106)
Creatinine, Ser: 1.03 mg/dL — ABNORMAL HIGH (ref 0.57–1.00)
GFR calc Af Amer: 76 mL/min/{1.73_m2} (ref >59)
GFR calc non Af Amer: 66 mL/min/{1.73_m2} (ref >59)
Globulin, Total: 2.8 g/dL (ref 1.5–4.5)
Glucose: 91 mg/dL (ref 65–99)
Potassium: 5 mmol/L (ref 3.5–5.2)
Sodium: 139 mmol/L (ref 134–144)
Total Protein: 7 g/dL (ref 6.0–8.5)

## 2021-01-03 LAB — CBC WITH DIFFERENTIAL/PLATELET
Basophils Absolute: 0.1 10*3/uL (ref 0.0–0.2)
Basos: 1 %
EOS (ABSOLUTE): 0.3 10*3/uL (ref 0.0–0.4)
Eos: 3 %
Hematocrit: 42.8 % (ref 34.0–46.6)
Hemoglobin: 13.5 g/dL (ref 11.1–15.9)
Immature Grans (Abs): 0.1 10*3/uL (ref 0.0–0.1)
Immature Granulocytes: 1 %
Lymphocytes Absolute: 2.8 10*3/uL (ref 0.7–3.1)
Lymphs: 22 %
MCH: 27.7 pg (ref 26.6–33.0)
MCHC: 31.5 g/dL (ref 31.5–35.7)
MCV: 88 fL (ref 79–97)
Monocytes Absolute: 0.7 10*3/uL (ref 0.1–0.9)
Monocytes: 5 %
Neutrophils Absolute: 9.1 10*3/uL — ABNORMAL HIGH (ref 1.4–7.0)
Neutrophils: 68 %
Platelets: 543 10*3/uL — ABNORMAL HIGH (ref 150–450)
RBC: 4.88 x10E6/uL (ref 3.77–5.28)
RDW: 14 % (ref 11.7–15.4)
WBC: 13.1 10*3/uL — ABNORMAL HIGH (ref 3.4–10.8)

## 2021-01-03 LAB — HEMOGLOBIN A1C
Est. average glucose Bld gHb Est-mCnc: 123 mg/dL
Hgb A1c MFr Bld: 5.9 % — ABNORMAL HIGH (ref 4.8–5.6)

## 2021-01-03 LAB — LIPID PANEL
Chol/HDL Ratio: 4.5 ratio — ABNORMAL HIGH (ref 0.0–4.4)
Cholesterol, Total: 227 mg/dL — ABNORMAL HIGH (ref 100–199)
HDL: 50 mg/dL (ref >39)
LDL Chol Calc (NIH): 134 mg/dL — ABNORMAL HIGH (ref 0–99)
Triglycerides: 243 mg/dL — ABNORMAL HIGH (ref 0–149)
VLDL Cholesterol Cal: 43 mg/dL — ABNORMAL HIGH (ref 5–40)

## 2021-01-03 LAB — T4, FREE: Free T4: 1.45 ng/dL (ref 0.82–1.77)

## 2021-01-03 LAB — INSULIN, RANDOM: INSULIN: 14.4 u[IU]/mL (ref 2.6–24.9)

## 2021-01-03 LAB — VITAMIN D 25 HYDROXY (VIT D DEFICIENCY, FRACTURES): Vit D, 25-Hydroxy: 42.2 ng/mL (ref 30.0–100.0)

## 2021-01-03 LAB — TSH: TSH: 2.41 u[IU]/mL (ref 0.450–4.500)

## 2021-01-04 ENCOUNTER — Other Ambulatory Visit: Payer: Self-pay | Admitting: Physician Assistant

## 2021-01-04 DIAGNOSIS — Z1231 Encounter for screening mammogram for malignant neoplasm of breast: Secondary | ICD-10-CM

## 2021-01-04 NOTE — Progress Notes (Signed)
Dear Crystal Bean, Crystal Bean,   Thank you for referring Crystal Bean to our clinic. The following note includes my evaluation and treatment recommendations.  Chief Complaint:   OBESITY Crystal Bean (MR# [id number]) is a 45 y.o. female who presents for evaluation and treatment of obesity and related comorbidities. Current BMI is Body mass index is 40.67 kg/m. Crystal Bean has been struggling with her weight for many years and has been unsuccessful in either losing weight, maintaining weight loss, or reaching her healthy weight goal.  Crystal Bean is currently in the action stage of change and ready to dedicate time achieving and maintaining a healthier weight. Crystal Bean is interested in becoming our patient and working on intensive lifestyle modifications including (but not limited to) diet and exercise for weight loss.  Crystal Bean is disabled.  Crystal Bean says Crystal Bean drinks sodas and sweet.  Crystal Bean eats at night.  Crystal Bean says Crystal Bean is bad at drinking water.  Crystal Bean is followed by Nada Libman at [facility].  Crystal Bean says that Crystal Bean helped her, but Crystal Bean could not afford it.  Crystal Bean provided the following food recall today:  Breakfast:  Strawberry Frosted Mini Wheats. Lunch:  Ramen or sandwich. Dinner:  Meat with a starch and vegetable.  Crystal Bean eats off salad plates for portion control.  Crystal Bean's habits were reviewed today and are as follows: Her family eats meals together, Crystal Bean thinks her family will eat healthier with her, her desired weight loss is 52 pounds, Crystal Bean has been heavy most of her life, Crystal Bean started gaining weight in 2007, her heaviest weight ever was 285 pounds, Crystal Bean craves snacks and pizza, Crystal Bean snacks frequently in the evenings, Crystal Bean wakes up frequently in the middle of the night to eat, Crystal Bean skips breakfast and sometimes lunch, Crystal Bean is frequently drinking liquids with calories and Crystal Bean frequently makes poor food choices.  Depression Screen Crystal Bean's Food and Mood (modified PHQ-9) score was 18.  Depression screen Crystal Bean 2/9  01/02/2021  Decreased Interest 3  Down, Depressed, Hopeless 1  PHQ - 2 Score 4  Altered sleeping 3  Tired, decreased energy 3  Change in appetite 3  Feeling bad or failure about yourself  1  Trouble concentrating 3  Moving slowly or fidgety/restless 1  Suicidal thoughts 0  PHQ-9 Score 18  Difficult doing work/chores Somewhat difficult   Assessment/Plan:   1. Other fatigue Crystal Bean admits to daytime somnolence and reports waking up still tired. Crystal Bean has a history of symptoms of daytime fatigue, morning fatigue and snoring. Crystal Bean generally gets 6 hours of sleep per night, and states that Crystal Bean has poor quality sleep. Snoring is present. Apneic episodes are present. Epworth Sleepiness Score is 17.  Shauntavia does feel that her weight is causing her energy to be lower than it should be. Fatigue may be related to obesity, depression or many other causes. Labs will be ordered, and in the meanwhile, Crystal Bean will focus on self care including making healthy food choices, increasing physical activity and focusing on stress reduction.  - EKG 12-Lead - Anemia panel - TSH - T4, free  2. SOB (shortness of Bean) on exertion Crystal Bean notes increasing shortness of Bean with exercising and seems to be worsening over time with weight gain. Crystal Bean notes getting out of Bean sooner with activity than Crystal Bean used to. This has gotten worse recently. Crystal Bean denies shortness of Bean at rest or orthopnea.  Crystal Bean does feel that Crystal Bean gets out of Bean more easily that Crystal Bean used to when Crystal Bean exercises. Crystal Bean  shortness of Bean appears to be obesity related and exercise induced. Crystal Bean has agreed to work on weight loss and gradually increase exercise to treat her exercise induced shortness of Bean. Will continue to monitor closely.  3. Essential hypertension At goal. Medications: losartan 50 mg daily.   Plan: Avoid buying foods that are: processed, frozen, or prepackaged to avoid excess salt. We will continue to monitor closely  alongside her PCP and/or Specialist.  Regular follow up with PCP and specialists was also encouraged.  Will check labs today, as per below.  BP Readings from Last 3 Encounters:  01/02/21 114/80  12/26/20 120/82  11/21/20 140/90   Lab Results  Component Value Date   CREATININE 1.03 (H) 01/02/2021   - CBC with Differential/Platelet - Comprehensive metabolic panel - Lipid panel - TSH - T4, free  4. Prediabetes Not at goal. Goal is HgbA1c < 5.7.  Medication: metformin 1,000 mg daily.  Blood glucose log reviewed.  Elevated fasting blood glucose 95-177.  Plan:  Crystal Bean will continue to focus on protein-rich, low simple carbohydrate foods. We reviewed the importance of hydration, regular exercise for stress reduction, and restorative sleep.  Will check CMP, A1c, and insulin level today.  Lab Results  Component Value Date   HGBA1C 5.9 (H) 01/02/2021   Lab Results  Component Value Date   INSULIN 14.4 01/02/2021   - Comprehensive metabolic panel - Hemoglobin A1c - Insulin, random  5. Chronic low back pain Janesia says this inhibits her ability to exercise.  Crystal Bean is only able to stand for around 10 minutes at a time.  Crystal Bean will be restarting PT next week.  6. OSA (obstructive sleep apnea) Mild, with no CPAP.  Epworth score is 17.  OSA is a cause of systemic hypertension and is associated with an increased incidence of stroke, heart failure, atrial fibrillation, and coronary heart disease. Severe OSA increases all-cause mortality and  cardiovascular mortality.   Goal: Treatment of OSA via CPAP compliance and weight loss. . Plasma ghrelin levels (appetite or "hunger hormone") are significantly higher in OSA patients than in BMI-matched controls, but decrease to levels similar to those of obese patients without OSA after CPAP treatment.  . Weight loss improves OSA by several mechanisms, including reduction in fatty tissue in the throat (i.e. parapharyngeal fat) and the tongue. Loss of abdominal  fat increases mediastinal traction on the upper airway making it less likely to collapse during sleep. . Studies have also shown that compliance with CPAP treatment improves leptin (hunger inhibitory hormone) imbalance.  7. B12 deficiency Lab Results  Component Value Date   VITAMINB12 1,072 (H) 08/02/2019   Supplementation:  Juniper has a history of getting vitamin B12 injections.  Crystal Bean is taking a daily multivitamin.  - CBC with Differential/Platelet  8. Vitamin D deficiency Optimal goal > 50 ng/dL.  Crystal Bean is taking a daily OTC vitamin D supplement.  Plan:  Continue taking daily OTC supplement.  Will check vitamin D level today.  - VITAMIN D 25 Hydroxy (Vit-D Deficiency, Fractures)  9. Other hyperlipidemia Course: Not at goal. Lipid-lowering medications: None.   Plan: Dietary changes: Increase soluble fiber, decrease simple carbohydrates, decrease saturated fat. Exercise changes: Moderate to vigorous-intensity aerobic activity 150 minutes per week or as tolerated. We will continue to monitor along with PCP/specialists as it pertains to her weight loss journey.  Check FLP today.  Lab Results  Component Value Date   CHOL 227 (H) 01/02/2021   HDL 50 01/02/2021   LDLCALC 134 (  H) 01/02/2021   LDLDIRECT 111.0 10/18/2019   TRIG 243 (H) 01/02/2021   CHOLHDL 4.5 (H) 01/02/2021   Lab Results  Component Value Date   ALT 24 01/02/2021   AST 16 01/02/2021   ALKPHOS 89 01/02/2021   BILITOT 0.2 01/02/2021   The 10-year ASCVD risk score Crystal Bean DC Jr., et al., 2013) is: 1.3%   Values used to calculate the score:     Age: 15 years     Sex: Female     Is Non-Hispanic African American: No     Diabetic: No     Tobacco smoker: No     Systolic Blood Pressure: 941 mmHg     Is BP treated: Yes     HDL Cholesterol: 50 mg/dL     Total Cholesterol: 227 mg/dL  - Lipid panel  10. Attention deficit hyperactivity disorder (ADHD), other type Toccara is followed by Psychiatry.  Crystal Bean is taking Adderall 10  mg twice daily.  Will follow along.  11. Bipolar 1 disorder ([place]) Kayla is taking Abilify 10 mg daily.  Crystal Bean is followed by Psychiatry.  12. Depression screening Tenna was screened for depression as part of her new patient workup today.  PHQ-9 is 18.  Zurisadai had a positive depression screening. Depression is commonly associated with obesity and often results in emotional eating behaviors. We will monitor this closely and work on CBT to help improve the non-hunger eating patterns. Referral to Psychology may be required if no improvement is seen as Crystal Bean continues in our clinic.  13. Class 3 severe obesity with serious comorbidity and body mass index (BMI) of 40.0 to 44.9 in adult, unspecified obesity type (HCC)  Skarleth is currently in the action stage of change and her goal is to continue with weight loss efforts. I recommend Mikaili begin the structured treatment plan as follows:  Crystal Bean has agreed to the Category 2 Plan.  Exercise goals: No exercise has been prescribed at this time.   Behavioral modification strategies: increasing lean protein intake, decreasing simple carbohydrates, increasing vegetables, increasing water intake, decreasing liquid calories (will stop soda and will work on sweet tea), decreasing alcohol intake, decreasing sodium intake and increasing high fiber foods.  Crystal Bean was informed of the importance of frequent follow-up visits to maximize her success with intensive lifestyle modifications for her multiple health conditions. Crystal Bean was informed we would discuss her lab results at her next visit unless there is a critical issue that needs to be addressed sooner. Derotha agreed to keep her next visit at the agreed upon time to discuss these results.  Objective:   Blood pressure 114/80, pulse 70, temperature 98.1 F (36.7 C), temperature source Oral, height 5\' 6"  (1.676 m), weight 252 lb (114.3 kg), last menstrual period 12/25/2020, SpO2 99 %. Body mass index is 40.67 kg/m.  EKG: Normal  sinus rhythm, rate 79 bpm.  Indirect Calorimeter completed today shows a VO2 of 238 and a REE of 1653.  Her calculated basal metabolic rate is 7408 thus her basal metabolic rate is worse than expected.  General: Cooperative, alert, well developed, in no acute distress. HEENT: Conjunctivae and lids unremarkable. Cardiovascular: Regular rhythm.  Lungs: Normal work of breathing. Neurologic: No focal deficits.   Lab Results  Component Value Date   CREATININE 1.03 (H) 01/02/2021   BUN 10 01/02/2021   NA 139 01/02/2021   K 5.0 01/02/2021   CL 95 (L) 01/02/2021   CO2 25 01/02/2021   Lab Results  Component Value Date  ALT 24 01/02/2021   AST 16 01/02/2021   ALKPHOS 89 01/02/2021   BILITOT 0.2 01/02/2021   Lab Results  Component Value Date   HGBA1C 5.9 (H) 01/02/2021   HGBA1C 5.7 (H) 09/20/2020   HGBA1C 6.0 10/18/2019   HGBA1C 6.2 08/02/2019   HGBA1C 5.9 04/08/2018   Lab Results  Component Value Date   INSULIN 14.4 01/02/2021   Lab Results  Component Value Date   TSH 2.410 01/02/2021   Lab Results  Component Value Date   CHOL 227 (H) 01/02/2021   HDL 50 01/02/2021   LDLCALC 134 (H) 01/02/2021   LDLDIRECT 111.0 10/18/2019   TRIG 243 (H) 01/02/2021   CHOLHDL 4.5 (H) 01/02/2021   Lab Results  Component Value Date   WBC 13.1 (H) 01/02/2021   HGB 13.5 01/02/2021   HCT 42.8 01/02/2021   MCV 88 01/02/2021   PLT 543 (H) 01/02/2021   Obesity Behavioral Intervention:   Approximately 15 minutes were spent on the discussion below.  ASK: We discussed the diagnosis of obesity with Norlene today and Annalee agreed to give Crystal Bean permission to discuss obesity behavioral modification therapy today.  ASSESS: Tamla has the diagnosis of obesity and her BMI today is 40.7. Brooklee is in the action stage of change.   ADVISE: Reighlyn was educated on the multiple health risks of obesity as well as the benefit of weight loss to improve her health. Crystal Bean was advised of the need for long term  treatment and the importance of lifestyle modifications to improve her current health and to decrease her risk of future health problems.  AGREE: Multiple dietary modification options and treatment options were discussed and Zahlia agreed to follow the recommendations documented in the above note.  ARRANGE: Nadia was educated on the importance of frequent visits to treat obesity as outlined per CMS and USPSTF guidelines and agreed to schedule her next follow up appointment today.  Attestation Statements:   This is the patient's first visit at [facility]. The patient's NEW PATIENT PACKET was reviewed at length. Included in the packet: current and past health history, medications, allergies, ROS, gynecologic history (women only), surgical history, family history, social history, weight history, weight loss surgery history (for those that have had weight loss surgery), nutritional evaluation, mood and food questionnaire, PHQ9, Epworth questionnaire, sleep habits questionnaire, patient life and health improvement goals questionnaire. These will all be scanned into the patient's chart under media.   During the visit, I independently reviewed the patient's EKG, bioimpedance scale results, and indirect calorimeter results. I used this information to tailor a meal plan for the patient that will help her to lose weight and will improve her obesity-related conditions going forward. I performed a medically necessary appropriate examination and/or evaluation. I discussed the assessment and treatment plan with the patient. The patient was provided an opportunity to ask questions and all were answered. The patient agreed with the plan and demonstrated an understanding of the instructions. Labs were ordered at this visit and will be reviewed at the next visit unless more critical results need to be addressed immediately. Clinical information was updated and documented in the EMR.   I, Water quality scientist, CMA,  am acting as transcriptionist for Briscoe Deutscher, DO  I have reviewed the above documentation for accuracy and completeness, and I agree with the above. Briscoe Deutscher, DO

## 2021-01-08 ENCOUNTER — Encounter: Payer: Self-pay | Admitting: Physical Therapy

## 2021-01-08 ENCOUNTER — Ambulatory Visit: Payer: Medicare Other | Admitting: Physical Therapy

## 2021-01-08 ENCOUNTER — Ambulatory Visit: Payer: Medicare Other | Attending: Physician Assistant | Admitting: Physical Therapy

## 2021-01-08 ENCOUNTER — Other Ambulatory Visit: Payer: Self-pay

## 2021-01-08 ENCOUNTER — Ambulatory Visit (INDEPENDENT_AMBULATORY_CARE_PROVIDER_SITE_OTHER): Payer: Medicare Other | Admitting: Psychology

## 2021-01-08 DIAGNOSIS — M6281 Muscle weakness (generalized): Secondary | ICD-10-CM | POA: Insufficient documentation

## 2021-01-08 DIAGNOSIS — F331 Major depressive disorder, recurrent, moderate: Secondary | ICD-10-CM | POA: Diagnosis not present

## 2021-01-08 DIAGNOSIS — M545 Low back pain, unspecified: Secondary | ICD-10-CM | POA: Diagnosis present

## 2021-01-08 DIAGNOSIS — R293 Abnormal posture: Secondary | ICD-10-CM | POA: Insufficient documentation

## 2021-01-08 DIAGNOSIS — G8929 Other chronic pain: Secondary | ICD-10-CM | POA: Insufficient documentation
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
## 2021-01-15 ENCOUNTER — Ambulatory Visit (INDEPENDENT_AMBULATORY_CARE_PROVIDER_SITE_OTHER): Payer: Medicare Other | Admitting: Psychology

## 2021-01-15 DIAGNOSIS — F331 Major depressive disorder, recurrent, moderate: Secondary | ICD-10-CM | POA: Diagnosis not present

## 2021-01-16 ENCOUNTER — Ambulatory Visit (INDEPENDENT_AMBULATORY_CARE_PROVIDER_SITE_OTHER): Payer: Medicare Other | Admitting: Family Medicine

## 2021-01-18 ENCOUNTER — Other Ambulatory Visit: Payer: Self-pay

## 2021-01-18 ENCOUNTER — Ambulatory Visit: Payer: Medicare Other | Attending: Physician Assistant | Admitting: Physical Therapy

## 2021-01-18 DIAGNOSIS — G8929 Other chronic pain: Secondary | ICD-10-CM | POA: Diagnosis present

## 2021-01-18 DIAGNOSIS — M6281 Muscle weakness (generalized): Secondary | ICD-10-CM | POA: Diagnosis present

## 2021-01-18 DIAGNOSIS — M545 Low back pain, unspecified: Secondary | ICD-10-CM | POA: Insufficient documentation

## 2021-01-18 DIAGNOSIS — R293 Abnormal posture: Secondary | ICD-10-CM

## 2021-01-18 NOTE — Patient Instructions (Signed)
Access Code: [email or web address] URL: [email or web address] Date: 01/18/2021 Prepared by: Ruben Im  Exercises Supine Hip Internal and External Rotation - 1 x daily - 7 x weekly - 10 reps - 1 sets - 5 sec hold Supine Hamstring Stretch - 1 x daily - 7 x weekly - 3 reps - 1 sets - 30 sec hold Hip Adductors and Hamstring Stretch with Strap - 1 x daily - 7 x weekly - 3 reps - 1 sets - 30 sec hold Hip External Rotation Stretch - 1 x daily - 7 x weekly - 3 sets - 3 reps Supine Bridge with Pelvic Floor Contraction and Hip Rotation - 1 x daily - 7 x weekly - 1 sets - 10 reps - 5 sec hold Supine Shoulder Horizontal Abduction with Resistance - 1 x daily - 7 x weekly - 3 sets - 10 reps Mini Squat with Pelvic Floor Contraction - 1 x daily - 7 x weekly - 2 sets - 10 reps Sit to Stand with Pelvic Floor Contraction - 1 x daily - 7 x weekly - 3 sets - 10 reps Resistance Pulldown with March - 1 x daily - 7 x weekly - 3 sets - 10 reps Side Stepping with Resistance at Ankles - 1 x daily - 7 x weekly - 3 sets - 10 reps Supine Transversus Abdominis Bracing - Hands on Stomach - 1 x daily - 7 x weekly - 1 sets - 10 reps Supine Transversus Abdominis Bracing with Heel Slide - 1 x daily - 7 x weekly - 1 sets - 10 reps Sidelying Feet Elevated Clamshells - 1 x daily - 7 x weekly - 1 sets - 10 reps Quadruped on Forearms Bent Knee Hip Extension Alternating - 1 x daily - 7 x weekly - 1 sets - 10 reps Arm beats - 1 x daily - 7 x weekly - 1 sets - 10 reps

## 2021-01-18 NOTE — Therapy (Signed)
[facility] [address] [place], [place], [address] Phone: [phone]   Fax:  [phone]  Physical Therapy Treatment  Patient Details  Name: Crystal Bean MRN: [id number] Date of Birth: 11-16-1976 Referring Provider (PT): Inda Coke, [place]   Encounter Date: 01/18/2021   PT End of Session - 01/18/21 1901    Visit Number 2    Date for PT Re-Evaluation 03/05/21    Authorization Type medicare A/B    PT Start Time 0800    PT Stop Time 0844    PT Time Calculation (min) 44 min    Activity Tolerance Patient tolerated treatment well           Past Medical History:  Diagnosis Date  . ADHD (attention deficit hyperactivity disorder)   . Anxiety   . B12 deficiency   . Back pain   . Bipolar 1 disorder ([place])   . Bulging lumbar disc   . C. difficile diarrhea 04/24/2016   treated with metronidazole  . Constipation   . Depression   . GERD (gastroesophageal reflux disease)   . History of chicken pox   . History of shingles 2013  . Hypertension   . IBS (irritable bowel syndrome)   . Joint pain   . Major depressive disorder   . Migraines   . Obesity   . Obsessive-compulsive disorder   . Osteoarthritis   . Prediabetes   . PTSD (post-traumatic stress disorder)   . Sleep apnea   . SOB (shortness of breath)     Past Surgical History:  Procedure Laterality Date  . BLADDER SURGERY  1981   Urethera stretched  . caps on teeth  1980   Caps on all Teeth  . CESAREAN SECTION  2003  . TONSILLECTOMY    . WISDOM TOOTH EXTRACTION      There were no vitals filed for this visit.   Subjective Assessment - 01/18/21 0804    Subjective Some left low back after cleaning yesterday.    Pertinent History    Currently in Pain? Yes    Pain Score 2     Pain Location Back    Pain Orientation Left                             [facility] Adult PT Treatment/Exercise - 01/18/21 0001      Lumbar Exercises:  Standing   Other Standing Lumbar Exercises ball on wall hip abduction isometric 5 sec hold 8x right/left    Other Standing Lumbar Exercises hip hinge with dowel behind back 3 points of contact 12x      Lumbar Exercises: Seated   Other Seated Lumbar Exercises arm beats 2 min      Lumbar Exercises: Supine   Ab Set 5 reps    Bent Knee Raise 10 reps    Bent Knee Raise Limitations with core set    Other Supine Lumbar Exercises core set + heel slides 5x right/left    Other Supine Lumbar Exercises arm beats      Lumbar Exercises: Sidelying   Clam Right;Left;10 reps    Clam Limitations feet elevated      Lumbar Exercises: Quadruped   Other Quadruped Lumbar Exercises on forearms with hip extension 10x right/left                  PT Education - 01/18/21 1900    Education Details core set with heel  slides; clams elevated feet; seated arm beats; quadruped hip extension    Person(s) Educated Patient    Methods Explanation;Demonstration;Handout    Comprehension Returned demonstration;Verbalized understanding            PT Short Term Goals - 01/08/21 0827      PT SHORT TERM GOAL #1   Title pt will be able to walk around the grocery store without feeling like she has to leave due to pain    Time 4    Period Weeks    Status New    Target Date 02/05/21             PT Long Term Goals - 01/08/21 0812      PT LONG TERM GOAL #1   Title Pt will be ind with HEP for maintaining strength gains    Time 8    Period Weeks    Status New    Target Date 03/05/21      PT LONG TERM GOAL #2   Title Pt will be able to load and unloading the dishwasher without stopping due to pain    Time 8    Period Weeks    Status New    Target Date 03/05/21      PT LONG TERM GOAL #3   Title Pt will be able to perform 10 squats with good mechanics and no breath holding for functional activities without pain    Time 8    Period Weeks    Status New    Target Date 03/05/21      PT LONG TERM  GOAL #4   Title Pt will be able to do all household chores with 70% less pain due to improved core strength and stability    Time 8    Period Weeks    Status New      PT LONG TERM GOAL #5   Title Pt will have improved FOTO score of 52 based on suggested outcomes    Baseline 42 at eval    Time 8    Period Weeks    Status New    Target Date 03/05/21                 Plan - 01/18/21 1902    Clinical Impression Statement The patient responds well to Pilates style mat core and gluteal strengthening without production of pain.  Minimal verbal cues to activate deep abdominals and to coordinate breathing for optimal muscle activation.  Weakness in gluteus medius muscles more so than glute max.  Muscular fatigue in glute medius rather quickly in standing.  Initiated hip hinge training as a precursor for progression to lifting.    Personal Factors and Comorbidities Time since onset of injury/illness/exacerbation;Comorbidity 1    PT Treatment/Interventions ADLs/Self Care Home Management;Biofeedback;Cryotherapy;Therapeutic exercise;Neuromuscular re-education;Therapeutic activities;Patient/family education;Manual techniques;Dry needling;Passive range of motion;Taping    PT Next Visit Plan glute strength core strength functional movements; hip hinge with progression to weight    PT Home Exercise Plan Access Code: [email or web address]           Patient will benefit from skilled therapeutic intervention in order to improve the following deficits and impairments:  Impaired flexibility,Decreased range of motion,Increased fascial restricitons,Postural dysfunction,Decreased endurance,Decreased strength  Visit Diagnosis: Chronic bilateral low back pain, unspecified whether sciatica present  Muscle weakness (generalized)  Abnormal posture     Problem List Patient Active Problem List   Diagnosis Date Noted  . Erythema nodosum 12/26/2020  . Excessive daytime sleepiness  10/30/2020  . Snoring 10/30/2020   . Nocturia more than twice per night 10/30/2020  . ADHD, adult residual type 10/30/2020  . 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 Obsessive compulsive disorder 09/08/2018  . Bipolar 1 disorder ([place]) 08/26/2018  . Obesity 04/08/2018  . Essential hypertension 04/08/2018  . PTSD (post-traumatic stress disorder)   . Osteoarthritis   . Migraines   . GERD (gastroesophageal reflux disease)   . Major depressive disorder, recurrent episode with mixed features ([place])   . Bulging lumbar disc    Ruben Im, PT 01/18/21 7:07 PM Phone: [phone] Fax: [phone] Alvera Singh 01/18/2021, 7:06 PM  [facility] [address] [place], [place], [address] Phone: [phone]   Fax:  [phone]  Name: Crystal Bean MRN: [id number] Date of Birth: Dec 26, 1975

## 2021-01-22 ENCOUNTER — Other Ambulatory Visit: Payer: Self-pay

## 2021-01-22 ENCOUNTER — Encounter (INDEPENDENT_AMBULATORY_CARE_PROVIDER_SITE_OTHER): Payer: Self-pay | Admitting: Family Medicine

## 2021-01-22 ENCOUNTER — Other Ambulatory Visit: Payer: Self-pay | Admitting: Physician Assistant

## 2021-01-22 ENCOUNTER — Ambulatory Visit (INDEPENDENT_AMBULATORY_CARE_PROVIDER_SITE_OTHER): Payer: Medicare Other | Admitting: Family Medicine

## 2021-01-22 VITALS — BP 120/82 | HR 72 | Temp 98.4°F | Ht 66.0 in | Wt 247.0 lb

## 2021-01-22 DIAGNOSIS — M545 Low back pain, unspecified: Secondary | ICD-10-CM | POA: Diagnosis not present

## 2021-01-22 DIAGNOSIS — Z6841 Body Mass Index (BMI) 40.0 and over, adult: Secondary | ICD-10-CM | POA: Diagnosis not present

## 2021-01-22 DIAGNOSIS — R7303 Prediabetes: Secondary | ICD-10-CM

## 2021-01-22 DIAGNOSIS — G8929 Other chronic pain: Secondary | ICD-10-CM

## 2021-01-22 DIAGNOSIS — E782 Mixed hyperlipidemia: Secondary | ICD-10-CM | POA: Diagnosis not present

## 2021-01-22 DIAGNOSIS — Z9189 Other specified personal risk factors, not elsewhere classified: Secondary | ICD-10-CM

## 2021-01-22 DIAGNOSIS — G4733 Obstructive sleep apnea (adult) (pediatric): Secondary | ICD-10-CM | POA: Diagnosis not present

## 2021-01-22 DIAGNOSIS — F319 Bipolar disorder, unspecified: Secondary | ICD-10-CM

## 2021-01-22 DIAGNOSIS — F902 Attention-deficit hyperactivity disorder, combined type: Secondary | ICD-10-CM

## 2021-01-22 NOTE — Progress Notes (Signed)
Chief Complaint:   OBESITY Crystal Bean is here to discuss her progress with her obesity treatment plan along with follow-up of her obesity related diagnoses.   Today's visit was #: 2 Starting weight: 252 lbs Starting date: 01/02/2021 Today's weight: 247 lbs Today's date: 01/22/2021 Total lbs lost to date: 5 lbs Body mass index is 40.67 kg/m.  Total weight loss percentage to date: -1.98%  Interim History:  Crystal Bean is seeing Crystal Bean and was recommended to start vitamins to help with memory.  Consider Neuro-Psych evaluation.  She says she has been drinking mostly tea and water. Crystal Bean was somewhat helpful, she says, but it was expensive.  Current Meal Plan: the Category 2 Plan for 85-90% of the time.  Current Exercise Plan: Core for 20 minutes 7 times per week.  Assessment/Plan:   1. Prediabetes Not at goal. Goal is HgbA1c < 5.7.  Medication:  metformin 1,000 mg daily.    Plan:  She will continue to focus on protein-rich, low simple carbohydrate foods. We reviewed the importance of hydration, regular exercise for stress reduction, and restorative sleep.  Continue metformin.  Lab Results  Component Value Date   HGBA1C 5.9 (H) 01/02/2021   Lab Results  Component Value Date   INSULIN 14.4 01/02/2021   2. Mixed hyperlipidemia Course: Not at goal. Lipid-lowering medications: None.   Plan: Dietary changes: Increase soluble fiber, decrease simple carbohydrates, decrease saturated fat. Exercise changes: Moderate to vigorous-intensity aerobic activity 150 minutes per week or as tolerated. We will continue to monitor along with PCP/specialists as it pertains to her weight loss journey.  Lab Results  Component Value Date   CHOL 227 (H) 01/02/2021   HDL 50 01/02/2021   LDLCALC 134 (H) 01/02/2021   LDLDIRECT 111.0 10/18/2019   TRIG 243 (H) 01/02/2021   CHOLHDL 4.5 (H) 01/02/2021   Lab Results  Component Value Date   ALT 24 01/02/2021   AST 16 01/02/2021   ALKPHOS 89 01/02/2021    BILITOT 0.2 01/02/2021   The 10-year ASCVD risk score Crystal Bean DC Jr., et al., 2013) is: 1.4%   Values used to calculate the score:     Age: 45 years     Sex: Female     Is Non-Hispanic African American: No     Diabetic: No     Tobacco smoker: No     Systolic Blood Pressure: 440 mmHg     Is BP treated: Yes     HDL Cholesterol: 50 mg/dL     Total Cholesterol: 227 mg/dL  3. Chronic low back pain Crystal Bean is taking gabapentin 100-300 mg daily as tolerated for back pain.  She is also in physical therapy. We will continue to monitor symptoms as they relate to her weight loss journey.  4. OSA (obstructive sleep apnea) OSA is a cause of systemic hypertension and is associated with an increased incidence of stroke, heart failure, atrial fibrillation, and coronary heart disease. Severe OSA increases all-cause mortality and  cardiovascular mortality.  Crystal Bean is not using a CPAP machine.  Goal: Treatment of OSA via CPAP compliance and weight loss. . Plasma ghrelin levels (appetite or "hunger hormone") are significantly higher in OSA patients than in BMI-matched controls, but decrease to levels similar to those of obese patients without OSA after CPAP treatment.  . Weight loss improves OSA by several mechanisms, including reduction in fatty tissue in the throat (i.e. parapharyngeal fat) and the tongue. Loss of abdominal fat increases mediastinal traction on the upper airway  making it less likely to collapse during sleep. . Studies have also shown that compliance with CPAP treatment improves leptin (hunger inhibitory hormone) imbalance.  5. Attention deficit hyperactivity disorder (ADHD), combined type Crystal Bean is followed by Psychiatry.  She is taking Adderall XR 10 mg daily. We will continue to monitor symptoms as they relate to her weight loss journey.  6. Bipolar 1 disorder (Crystal Bean), with emotional eating She is taking Effexor XR 300 mg daily, Abilify 10 mg daily, and Wellbutrin XL 300 mg daily.  Behavior  modification techniques were discussed today to help Crystal Bean deal with her emotional/non-hunger eating behaviors.    7. At risk for heart disease Due to Crystal Bean's current state of health and medical condition(s), she is at a higher risk for heart disease.  This puts the patient at much greater risk to subsequently develop cardiopulmonary conditions that can significantly affect patient's quality of life in a negative manner.    At least 8 minutes were spent on counseling Crystal Bean about these concerns today. Counseling:  Intensive lifestyle modifications were discussed with Crystal Bean as the most appropriate first line of treatment.  She will continue to work on diet, exercise, and weight loss efforts.  We will continue to reassess these conditions on a fairly regular basis in an attempt to decrease the patient's overall morbidity and mortality.  Evidence-based interventions for health behavior change were utilized today including the discussion of self monitoring techniques, problem-solving barriers, and SMART goal setting techniques.  Specifically, regarding patient's less desirable eating habits and patterns, we employed the technique of small changes when Crystal Bean has not been able to fully commit to her prudent nutritional plan.  8. Class 3 severe obesity with serious comorbidity and body mass index (BMI) of 40.0 to 44.9 in adult, unspecified obesity type [facility])  Course: Crystal Bean is currently in the action stage of change. As such, her goal is to continue with weight loss efforts.   Nutrition goals: She has agreed to the Category 2 Plan.   Exercise goals: As is.  Behavioral modification strategies: increasing lean protein intake, decreasing simple carbohydrates, increasing vegetables and increasing water intake.  Crystal Bean has agreed to follow-up with our clinic in 3 weeks. She was informed of the importance of frequent follow-up visits to maximize her success with intensive lifestyle modifications for her multiple health  conditions.   Objective:   Blood pressure 120/82, pulse 72, temperature 98.4 F (36.9 C), height 5\' 6"  (1.676 m), last menstrual period 12/25/2020, SpO2 95 %. Body mass index is 40.67 kg/m.  General: Cooperative, alert, well developed, in no acute distress. HEENT: Conjunctivae and lids unremarkable. Cardiovascular: Regular rhythm.  Lungs: Normal work of breathing. Neurologic: No focal deficits.   Lab Results  Component Value Date   CREATININE 1.03 (H) 01/02/2021   BUN 10 01/02/2021   NA 139 01/02/2021   K 5.0 01/02/2021   CL 95 (L) 01/02/2021   CO2 25 01/02/2021   Lab Results  Component Value Date   ALT 24 01/02/2021   AST 16 01/02/2021   ALKPHOS 89 01/02/2021   BILITOT 0.2 01/02/2021   Lab Results  Component Value Date   HGBA1C 5.9 (H) 01/02/2021   HGBA1C 5.7 (H) 09/20/2020   HGBA1C 6.0 10/18/2019   HGBA1C 6.2 08/02/2019   HGBA1C 5.9 04/08/2018   Lab Results  Component Value Date   INSULIN 14.4 01/02/2021   Lab Results  Component Value Date   TSH 2.410 01/02/2021   Lab Results  Component Value Date  CHOL 227 (H) 01/02/2021   HDL 50 01/02/2021   LDLCALC 134 (H) 01/02/2021   LDLDIRECT 111.0 10/18/2019   TRIG 243 (H) 01/02/2021   CHOLHDL 4.5 (H) 01/02/2021   Lab Results  Component Value Date   WBC 13.1 (H) 01/02/2021   HGB 13.5 01/02/2021   HCT 42.8 01/02/2021   MCV 88 01/02/2021   PLT 543 (H) 01/02/2021   Attestation Statements:   Reviewed by clinician on day of visit: allergies, medications, problem list, medical history, surgical history, family history, social history, and previous encounter notes.  I, Water quality scientist, CMA, am acting as transcriptionist for Briscoe Deutscher, DO  I have reviewed the above documentation for accuracy and completeness, and I agree with the above. Briscoe Deutscher, DO

## 2021-01-24 ENCOUNTER — Ambulatory Visit: Payer: Medicare Other | Admitting: Physical Therapy

## 2021-01-24 ENCOUNTER — Other Ambulatory Visit: Payer: Self-pay

## 2021-01-24 DIAGNOSIS — M6281 Muscle weakness (generalized): Secondary | ICD-10-CM

## 2021-01-24 DIAGNOSIS — M545 Low back pain, unspecified: Secondary | ICD-10-CM | POA: Diagnosis not present

## 2021-01-24 DIAGNOSIS — G8929 Other chronic pain: Secondary | ICD-10-CM

## 2021-01-24 DIAGNOSIS — R293 Abnormal posture: Secondary | ICD-10-CM

## 2021-01-24 NOTE — Patient Instructions (Signed)
[email or web address]  Armed forces technical officer at [employer] @ [address] [place], [place] [address] [phone]
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
## 2021-01-31 ENCOUNTER — Encounter: Payer: Self-pay | Admitting: Physical Therapy

## 2021-01-31 ENCOUNTER — Other Ambulatory Visit: Payer: Self-pay

## 2021-01-31 ENCOUNTER — Ambulatory Visit: Payer: Medicare Other | Admitting: Physical Therapy

## 2021-01-31 DIAGNOSIS — G8929 Other chronic pain: Secondary | ICD-10-CM

## 2021-01-31 DIAGNOSIS — M545 Low back pain, unspecified: Secondary | ICD-10-CM

## 2021-01-31 DIAGNOSIS — R293 Abnormal posture: Secondary | ICD-10-CM

## 2021-01-31 DIAGNOSIS — M6281 Muscle weakness (generalized): Secondary | ICD-10-CM
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
## 2021-02-07 ENCOUNTER — Other Ambulatory Visit: Payer: Self-pay

## 2021-02-07 ENCOUNTER — Ambulatory Visit: Payer: Medicare Other | Admitting: Physical Therapy

## 2021-02-07 ENCOUNTER — Encounter (INDEPENDENT_AMBULATORY_CARE_PROVIDER_SITE_OTHER): Payer: Self-pay | Admitting: Family Medicine

## 2021-02-07 ENCOUNTER — Ambulatory Visit (INDEPENDENT_AMBULATORY_CARE_PROVIDER_SITE_OTHER): Payer: Medicare Other | Admitting: Family Medicine

## 2021-02-07 VITALS — BP 109/72 | HR 90 | Temp 98.0°F | Ht 66.0 in | Wt 246.0 lb

## 2021-02-07 DIAGNOSIS — R293 Abnormal posture: Secondary | ICD-10-CM

## 2021-02-07 DIAGNOSIS — R7303 Prediabetes: Secondary | ICD-10-CM

## 2021-02-07 DIAGNOSIS — G8929 Other chronic pain: Secondary | ICD-10-CM

## 2021-02-07 DIAGNOSIS — M545 Low back pain, unspecified: Secondary | ICD-10-CM | POA: Diagnosis not present

## 2021-02-07 DIAGNOSIS — M6281 Muscle weakness (generalized): Secondary | ICD-10-CM

## 2021-02-07 DIAGNOSIS — Z6841 Body Mass Index (BMI) 40.0 and over, adult: Secondary | ICD-10-CM | POA: Diagnosis not present

## 2021-02-07 NOTE — Patient Instructions (Signed)
Access Code: [email or web address] URL: [email or web address] Date: 02/07/2021 Prepared by: Jari Favre  Exercises Supine Hip Internal and External Rotation - 1 x daily - 7 x weekly - 10 reps - 1 sets - 5 sec hold Supine Hamstring Stretch - 1 x daily - 7 x weekly - 3 reps - 1 sets - 30 sec hold Hip Adductors and Hamstring Stretch with Strap - 1 x daily - 7 x weekly - 3 reps - 1 sets - 30 sec hold Hip External Rotation Stretch - 1 x daily - 7 x weekly - 3 sets - 3 reps Supine Bridge with Pelvic Floor Contraction and Hip Rotation - 1 x daily - 7 x weekly - 1 sets - 10 reps - 5 sec hold Supine Shoulder Horizontal Abduction with Resistance - 1 x daily - 7 x weekly - 3 sets - 10 reps Mini Squat with Pelvic Floor Contraction - 1 x daily - 7 x weekly - 2 sets - 10 reps Sit to Stand with Pelvic Floor Contraction - 1 x daily - 7 x weekly - 3 sets - 10 reps Resistance Pulldown with March - 1 x daily - 7 x weekly - 3 sets - 10 reps Side Stepping with Resistance at Ankles - 1 x daily - 7 x weekly - 3 sets - 10 reps Supine Transversus Abdominis Bracing - Hands on Stomach - 1 x daily - 7 x weekly - 1 sets - 10 reps Supine Transversus Abdominis Bracing with Heel Slide - 1 x daily - 7 x weekly - 1 sets - 10 reps Sidelying Feet Elevated Clamshells - 1 x daily - 7 x weekly - 1 sets - 10 reps Quadruped on Forearms Bent Knee Hip Extension Alternating - 1 x daily - 7 x weekly - 1 sets - 10 reps Arm beats - 1 x daily - 7 x weekly - 1 sets - 10 reps Standing Hip Hinge with Dowel - 1 x daily - 7 x weekly - 3 sets - 10 reps
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
## 2021-02-08 ENCOUNTER — Other Ambulatory Visit: Payer: Self-pay | Admitting: Physician Assistant

## 2021-02-09 ENCOUNTER — Other Ambulatory Visit: Payer: Self-pay

## 2021-02-09 ENCOUNTER — Ambulatory Visit (INDEPENDENT_AMBULATORY_CARE_PROVIDER_SITE_OTHER): Payer: Medicare Other

## 2021-02-09 VITALS — BP 128/78 | HR 87 | Temp 97.7°F | Wt 247.2 lb

## 2021-02-09 DIAGNOSIS — Z0000 Encounter for general adult medical examination without abnormal findings: Secondary | ICD-10-CM

## 2021-02-09 DIAGNOSIS — Z1211 Encounter for screening for malignant neoplasm of colon: Secondary | ICD-10-CM

## 2021-02-09 NOTE — Patient Instructions (Addendum)
[name] , Thank you for taking time to come for your Medicare Wellness Visit. I appreciate your ongoing commitment to your health goals. Please review the following plan we discussed and let me know if I can assist you in the future.   Screening recommendations/referrals: Colonoscopy: Ordered placed 02/09/21 Mammogram: Done 12/09/19 Recommended yearly ophthalmology/optometry visit for glaucoma screening and checkup Recommended yearly dental visit for hygiene and checkup  Vaccinations: Influenza vaccine: Up to date Tdap vaccine: Up to date  Covid-19: Completed 3/19, 4/13, & 10/11/20  Advanced directives: Advance directive discussed with you today. I have provided a copy for you to complete at home and have notarized. Once this is complete please bring a copy in to our office so we can scan it into your chart.  Conditions/risks identified: Lose weight obtain goal of losing 200lbs  Next appointment: Follow up in one year for your annual wellness visit.   Preventive Care 40-64 Years, Female Preventive care refers to lifestyle choices and visits with your health care provider that can promote health and wellness. What does preventive care include?  A yearly physical exam. This is also called an annual well check.  Dental exams once or twice a year.  Routine eye exams. Ask your health care provider how often you should have your eyes checked.  Personal lifestyle choices, including:  Daily care of your teeth and gums.  Regular physical activity.  Eating a healthy diet.  Avoiding tobacco and drug use.  Limiting alcohol use.  Practicing safe sex.  Taking low-dose aspirin daily starting at age 40.  Taking vitamin and mineral supplements as recommended by your health care provider. What happens during an annual well check? The services and screenings done by your health care provider during your annual well check will depend on your age, overall health, lifestyle risk factors,  and family history of disease. Counseling  Your health care provider may ask you questions about your:  Alcohol use.  Tobacco use.  Drug use.  Emotional well-being.  Home and relationship well-being.  Sexual activity.  Eating habits.  Work and work Statistician.  Method of birth control.  Menstrual cycle.  Pregnancy history. Screening  You may have the following tests or measurements:  Height, weight, and BMI.  Blood pressure.  Lipid and cholesterol levels. These may be checked every 5 years, or more frequently if you are over 4 years old.  Skin check.  Lung cancer screening. You may have this screening every year starting at age 19 if you have a 30-pack-year history of smoking and currently smoke or have quit within the past 15 years.  Fecal occult blood test (FOBT) of the stool. You may have this test every year starting at age 24.  Flexible sigmoidoscopy or colonoscopy. You may have a sigmoidoscopy every 5 years or a colonoscopy every 10 years starting at age 35.  Hepatitis C blood test.  Hepatitis B blood test.  Sexually transmitted disease (STD) testing.  Diabetes screening. This is done by checking your blood sugar (glucose) after you have not eaten for a while (fasting). You may have this done every 1-3 years.  Mammogram. This may be done every 1-2 years. Talk to your health care provider about when you should start having regular mammograms. This may depend on whether you have a family history of breast cancer.  BRCA-related cancer screening. This may be done if you have a family history of breast, ovarian, tubal, or peritoneal cancers.  Pelvic exam and Pap test.  This may be done every 3 years starting at age 4. Starting at age 40, this may be done every 5 years if you have a Pap test in combination with an HPV test.  Bone density scan. This is done to screen for osteoporosis. You may have this scan if you are at high risk for osteoporosis. Discuss  your test results, treatment options, and if necessary, the need for more tests with your health care provider. Vaccines  Your health care provider may recommend certain vaccines, such as:  Influenza vaccine. This is recommended every year.  Tetanus, diphtheria, and acellular pertussis (Tdap, Td) vaccine. You may need a Td booster every 10 years.  Zoster vaccine. You may need this after age 42.  Pneumococcal 13-valent conjugate (PCV13) vaccine. You may need this if you have certain conditions and were not previously vaccinated.  Pneumococcal polysaccharide (PPSV23) vaccine. You may need one or two doses if you smoke cigarettes or if you have certain conditions. Talk to your health care provider about which screenings and vaccines you need and how often you need them. This information is not intended to replace advice given to you by your health care provider. Make sure you discuss any questions you have with your health care provider. Document Released: 12/01/2015 Document Revised: 07/24/2016 Document Reviewed: 09/05/2015 Elsevier Interactive Patient Education  2017 [employer] Prevention in the Home Falls can cause injuries. They can happen to people of all ages. There are many things you can do to make your home safe and to help prevent falls. What can I do on the outside of my home?  Regularly fix the edges of walkways and driveways and fix any cracks.  Remove anything that might make you trip as you walk through a door, such as a raised step or threshold.  Trim any bushes or trees on the path to your home.  Use bright outdoor lighting.  Clear any walking paths of anything that might make someone trip, such as rocks or tools.  Regularly check to see if handrails are loose or broken. Make sure that both sides of any steps have handrails.  Any raised decks and porches should have guardrails on the edges.  Have any leaves, snow, or ice cleared regularly.  Use sand  or salt on walking paths during winter.  Clean up any spills in your garage right away. This includes oil or grease spills. What can I do in the bathroom?  Use night lights.  Install grab bars by the toilet and in the tub and shower. Do not use towel bars as grab bars.  Use non-skid mats or decals in the tub or shower.  If you need to sit down in the shower, use a plastic, non-slip stool.  Keep the floor dry. Clean up any water that spills on the floor as soon as it happens.  Remove soap buildup in the tub or shower regularly.  Attach bath mats securely with double-sided non-slip rug tape.  Do not have throw rugs and other things on the floor that can make you trip. What can I do in the bedroom?  Use night lights.  Make sure that you have a light by your bed that is easy to reach.  Do not use any sheets or blankets that are too big for your bed. They should not hang down onto the floor.  Have a firm chair that has side arms. You can use this for support while you get dressed.  Do not have throw rugs and other things on the floor that can make you trip. What can I do in the kitchen?  Clean up any spills right away.  Avoid walking on wet floors.  Keep items that you use a lot in easy-to-reach places.  If you need to reach something above you, use a strong step stool that has a grab bar.  Keep electrical cords out of the way.  Do not use floor polish or wax that makes floors slippery. If you must use wax, use non-skid floor wax.  Do not have throw rugs and other things on the floor that can make you trip. What can I do with my stairs?  Do not leave any items on the stairs.  Make sure that there are handrails on both sides of the stairs and use them. Fix handrails that are broken or loose. Make sure that handrails are as long as the stairways.  Check any carpeting to make sure that it is firmly attached to the stairs. Fix any carpet that is loose or worn.  Avoid  having throw rugs at the top or bottom of the stairs. If you do have throw rugs, attach them to the floor with carpet tape.  Make sure that you have a light switch at the top of the stairs and the bottom of the stairs. If you do not have them, ask someone to add them for you. What else can I do to help prevent falls?  Wear shoes that:  Do not have high heels.  Have rubber bottoms.  Are comfortable and fit you well.  Are closed at the toe. Do not wear sandals.  If you use a stepladder:  Make sure that it is fully opened. Do not climb a closed stepladder.  Make sure that both sides of the stepladder are locked into place.  Ask someone to hold it for you, if possible.  Clearly mark and make sure that you can see:  Any grab bars or handrails.  First and last steps.  Where the edge of each step is.  Use tools that help you move around (mobility aids) if they are needed. These include:  Canes.  Walkers.  Scooters.  Crutches.  Turn on the lights when you go into a dark area. Replace any light bulbs as soon as they burn out.  Set up your furniture so you have a clear path. Avoid moving your furniture around.  If any of your floors are uneven, fix them.  If there are any pets around you, be aware of where they are.  Review your medicines with your doctor. Some medicines can make you feel dizzy. This can increase your chance of falling. Ask your doctor what other things that you can do to help prevent falls. This information is not intended to replace advice given to you by your health care provider. Make sure you discuss any questions you have with your health care provider. Document Released: 08/31/2009 Document Revised: 04/11/2016 Document Reviewed: 12/09/2014 Elsevier Interactive Patient Education  2017 [employer].
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
## 2021-02-12 ENCOUNTER — Encounter (INDEPENDENT_AMBULATORY_CARE_PROVIDER_SITE_OTHER): Payer: Self-pay | Admitting: Family Medicine

## 2021-02-12 ENCOUNTER — Telehealth: Payer: Self-pay

## 2021-02-12 ENCOUNTER — Other Ambulatory Visit: Payer: Self-pay | Admitting: Physician Assistant

## 2021-02-12 DIAGNOSIS — R7303 Prediabetes: Secondary | ICD-10-CM | POA: Insufficient documentation

## 2021-02-12 MED ORDER — TRIAMCINOLONE ACETONIDE 0.5 % EX OINT: 1.0000 "application " | TOPICAL_OINTMENT | Freq: Two times a day (BID) | CUTANEOUS | 1 refills | Status: DC

## 2021-02-12 NOTE — Telephone Encounter (Signed)
Rx sent to pharmacy   

## 2021-02-12 NOTE — Progress Notes (Signed)
Chief Complaint:   OBESITY Crystal Bean is here to discuss her progress with her obesity treatment plan along with follow-up of her obesity related diagnoses. Crystal Bean is on the Category 2 Plan and states she is following her eating plan approximately 80-85% of the time. Crystal Bean states she is doing 0 minutes 0 times per week.  Today's visit was #: 3 Starting weight: 252 lbs Starting date: 01/02/2021 Today's weight: 246 lbs Today's date: 02/07/2021 Total lbs lost to date: 6 Total lbs lost since last in-office visit: 1  Interim History: Crystal Bean has struggled more in the last few weeks. She has had some soda and cookies. She writes all of her foods down but she does not log calories and protein. She sometimes skips lunch due to lack of hunger from Adderall.  Subjective:   1. Pre-diabetes Crystal Bean's last A1c was 5.9. She is on metformin per her primary care provider. She denies polyphagia secondary to Adderall. She notes cravings for sweets.   Lab Results  Component Value Date   HGBA1C 5.9 (H) 01/02/2021   Lab Results  Component Value Date   INSULIN 14.4 01/02/2021   Assessment/Plan:   1. Pre-diabetes Crystal Bean will continue metformin.  2. Class 2 severe obesity:BMI 70 Crystal Bean is currently in the action stage of change. As such, her goal is to continue with weight loss efforts. She has agreed to the Category 2 Plan.   Handouts were given today: Protein Content of Food, and Protein Equivalents.  Exercise goals: No exercise has been prescribed at this time.  Behavioral modification strategies: increasing lean protein intake and no skipping meals.  Crystal Bean has agreed to follow-up with our clinic in 2 weeks with Crystal Bean, and in 4 weeks with myself.  Objective:   Blood pressure 109/72, pulse 90, temperature 98 F (36.7 C), height 5\' 6"  (1.676 m), weight 246 lb (111.6 kg), SpO2 96 %. Body mass index is 39.71 kg/m.  General: Cooperative, alert, well developed, in no acute distress. HEENT:  Conjunctivae and lids unremarkable. Cardiovascular: Regular rhythm.  Lungs: Normal work of breathing. Neurologic: No focal deficits.   Lab Results  Component Value Date   CREATININE 1.03 (H) 01/02/2021   BUN 10 01/02/2021   NA 139 01/02/2021   K 5.0 01/02/2021   CL 95 (L) 01/02/2021   CO2 25 01/02/2021   Lab Results  Component Value Date   ALT 24 01/02/2021   AST 16 01/02/2021   ALKPHOS 89 01/02/2021   BILITOT 0.2 01/02/2021   Lab Results  Component Value Date   HGBA1C 5.9 (H) 01/02/2021   HGBA1C 5.7 (H) 09/20/2020   HGBA1C 6.0 10/18/2019   HGBA1C 6.2 08/02/2019   HGBA1C 5.9 04/08/2018   Lab Results  Component Value Date   INSULIN 14.4 01/02/2021   Lab Results  Component Value Date   TSH 2.410 01/02/2021   Lab Results  Component Value Date   CHOL 227 (H) 01/02/2021   HDL 50 01/02/2021   LDLCALC 134 (H) 01/02/2021   LDLDIRECT 111.0 10/18/2019   TRIG 243 (H) 01/02/2021   CHOLHDL 4.5 (H) 01/02/2021   Lab Results  Component Value Date   WBC 13.1 (H) 01/02/2021   HGB 13.5 01/02/2021   HCT 42.8 01/02/2021   MCV 88 01/02/2021   PLT 543 (H) 01/02/2021   No results found for: IRON, TIBC, FERRITIN  Obesity Behavioral Intervention:   Approximately 15 minutes were spent on the discussion below.  ASK: We discussed the diagnosis of obesity with  Crystal Bean today and Crystal Bean agreed to give [place] permission to discuss obesity behavioral modification therapy today.  ASSESS: Crystal Bean has the diagnosis of obesity and her BMI today is 39.72. Crystal Bean is in the action stage of change.   ADVISE: Crystal Bean was educated on the multiple health risks of obesity as well as the benefit of weight loss to improve her health. She was advised of the need for long term treatment and the importance of lifestyle modifications to improve her current health and to decrease her risk of future health problems.  AGREE: Multiple dietary modification options and treatment options were discussed and Crystal Bean agreed to  follow the recommendations documented in the above note.  ARRANGE: Crystal Bean was educated on the importance of frequent visits to treat obesity as outlined per CMS and USPSTF guidelines and agreed to schedule her next follow up appointment today.  Attestation Statements:   Reviewed by clinician on day of visit: allergies, medications, problem list, medical history, surgical history, family history, social history, and previous encounter notes.   Crystal Bean, am acting as Location manager for [employer], FNP-C.  I have reviewed the above documentation for accuracy and completeness, and I agree with the above. -  Crystal Fick, FNP

## 2021-02-12 NOTE — Telephone Encounter (Signed)
..   LAST APPOINTMENT DATE: 02.06/2021   NEXT APPOINTMENT DATE:@Visit  date not found  MEDICATION:triamcinolone ointment (KENALOG) 0.5 %   PHARMACY:  Let patient know to contact pharmacy at the end of the day to make sure medication is ready.  Please notify patient to allow 48-72 hours to process  Encourage patient to contact the pharmacy for refills or they can request refills through [place]:   LAST REFILL:  QTY:  REFILL DATE:    OTHER COMMENTS:    Okay for refill?  Please advise

## 2021-02-13 ENCOUNTER — Other Ambulatory Visit: Payer: Self-pay

## 2021-02-13 ENCOUNTER — Ambulatory Visit: Payer: Medicare Other | Admitting: Physical Therapy

## 2021-02-13 DIAGNOSIS — M545 Low back pain, unspecified: Secondary | ICD-10-CM | POA: Diagnosis not present

## 2021-02-13 DIAGNOSIS — G8929 Other chronic pain: Secondary | ICD-10-CM

## 2021-02-13 DIAGNOSIS — R293 Abnormal posture: Secondary | ICD-10-CM

## 2021-02-13 DIAGNOSIS — M6281 Muscle weakness (generalized): Secondary | ICD-10-CM

## 2021-02-13 NOTE — Therapy (Signed)
[facility] [address] [place], [place], [address] Phone: [phone]   Fax:  [phone]  Physical Therapy Treatment  Patient Details  Name: Crystal Bean MRN: [id number] Date of Birth: Jun 13, 1976 Referring Provider (PT): Inda Coke, [place]   Encounter Date: 02/13/2021   PT End of Session - 02/13/21 0849    Visit Number 7    Date for PT Re-Evaluation 03/05/21    PT Start Time 0800    PT Stop Time 0842    PT Time Calculation (min) 42 min    Activity Tolerance Patient tolerated treatment well           Past Medical History:  Diagnosis Date  . ADHD (attention deficit hyperactivity disorder)   . Anxiety   . B12 deficiency   . Back pain   . Bipolar 1 disorder ([place])   . Bulging lumbar disc   . C. difficile diarrhea 04/24/2016   treated with metronidazole  . Constipation   . Depression   . GERD (gastroesophageal reflux disease)   . History of chicken pox   . History of shingles 2013  . Hypertension   . IBS (irritable bowel syndrome)   . Joint pain   . Major depressive disorder   . Migraines   . Obesity   . Obsessive-compulsive disorder   . Osteoarthritis   . Prediabetes   . PTSD (post-traumatic stress disorder)   . Sleep apnea   . SOB (shortness of breath)     Past Surgical History:  Procedure Laterality Date  . BLADDER SURGERY  1981   [place] stretched  . caps on teeth  1980   Caps on all Teeth  . CESAREAN SECTION  2003  . TONSILLECTOMY    . WISDOM TOOTH EXTRACTION      There were no vitals filed for this visit.   Subjective Assessment - 02/13/21 0803    Subjective I have a quirky catch in my shoulder but that's not related.   My back is always going to hurt but I've learned a lot.  Overall 60% better.   I have a ball at home but it's not fully inflated.    How long can you walk comfortably? it depends on the day, some days cannot go to the grocery store    Patient Stated Goals be  able to do chores without pain and be able to walk 1 mile    Currently in Pain? Yes    Pain Score 3     Pain Location Back    Pain Type Chronic pain                             [facility] Adult PT Treatment/Exercise - 02/13/21 0001      Lumbar Exercises: Stretches   Active Hamstring Stretch Right;Left;2 reps;30 seconds   supine with stratch; side to side bias     Lumbar Exercises: Aerobic   UBE (Upper Arm Bike) 5 min L1 FW only while discussing status      Lumbar Exercises: Standing   Functional Squats 20 reps    Functional Squats Limitations with red physioball on wall    Shoulder Extension Limitations chest on red ball on wall with UE Ts, Is and rows 10x each    Other Standing Lumbar Exercises modified dead lift 2 5# dumbells 15x    Other Standing Lumbar Exercises hip hinge with dowel behind back 3 points of  contact 12x      Lumbar Exercises: Seated   Other Seated Lumbar Exercises push downs on ball 20x     Lumbar Exercises: Supine   Large Ball Abdominal Isometric Limitations UE press; knee press into ball; UE/LE lift offs  10x each                 PT Education - 02/13/21 0845    Education Details wall squats with ball; ball on wall arm Ts, Is and row;  partial dead lift with 2 5# weights    Person(s) Educated Patient    Methods Explanation;Demonstration;Handout    Comprehension Returned demonstration;Verbalized understanding            PT Short Term Goals - 02/07/21 1223      PT SHORT TERM GOAL #1   Title pt will be able to walk around the grocery store without feeling like she has to leave due to pain    Status Achieved             PT Long Term Goals - 02/07/21 1223      PT LONG TERM GOAL #1   Title Pt will be ind with HEP for maintaining strength gains    Status On-going      PT LONG TERM GOAL #2   Title Pt will be able to load and unloading the dishwasher without stopping due to pain    Baseline I do it in pieces    Status  On-going      PT LONG TERM GOAL #3   Title Pt will be able to perform 10 squats with good mechanics and no breath holding for functional activities without pain    Baseline did 10x sit to stand with weighted ball no pain    Status On-going      PT LONG TERM GOAL #4   Title Pt will be able to do all household chores with 70% less pain due to improved core strength and stability    Baseline 10-20% improved    Status On-going      PT LONG TERM GOAL #5   Title Pt will have improved FOTO score of 52 based on suggested outcomes    Status On-going                 Plan - 02/13/21 0825    Clinical Impression Statement The patient has difficulty rating her overall improvement noting a long term problem but generally feels she "over the hump" about 60% better.  Treatment focus on a progression of core strengthening using an ex ball she has at home and 2 5# dumbells.  She demonstrates good hip hinge form even without the dowel with just min cues for head position and scapular squeeze to rise.  She will continue her HEP for 2 weeks then follow up to assess progress toward goals and determination of readiness for discharge vs. continuation.    Personal Factors and Comorbidities Time since onset of injury/illness/exacerbation;Comorbidity 1    PT Treatment/Interventions ADLs/Self Care Home Management;Biofeedback;Cryotherapy;Therapeutic exercise;Neuromuscular re-education;Therapeutic activities;Patient/family education;Manual techniques;Dry needling;Passive range of motion;Taping    PT Next Visit Plan she may bring in her ball to be inflated;  ERO to determine readiness for discharge or the need for continued PT;  glute strength core strength functional movements; hip hinge with progression to weight to simulate unloading dishwasher    PT Home Exercise Plan Access Code: [email or web address]           Patient will  benefit from skilled therapeutic intervention in order to improve the following deficits and  impairments:  Impaired flexibility,Decreased range of motion,Increased fascial restricitons,Postural dysfunction,Decreased endurance,Decreased strength  Visit Diagnosis: Chronic bilateral low back pain, unspecified whether sciatica present  Muscle weakness (generalized)  Abnormal posture     Problem List Patient Active Problem List   Diagnosis Date Noted  . Pre-diabetes 02/12/2021  . Erythema nodosum 12/26/2020  . Excessive daytime sleepiness 10/30/2020  . Snoring 10/30/2020  . Nocturia more than twice per night 10/30/2020  . ADHD, adult residual type 10/30/2020  . 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 Obsessive compulsive disorder 09/08/2018  . Bipolar 1 disorder ([place]) 08/26/2018  . Obesity 04/08/2018  . Essential hypertension 04/08/2018  . PTSD (post-traumatic stress disorder)   . Osteoarthritis   . Migraines   . GERD (gastroesophageal reflux disease)   . Major depressive disorder, recurrent episode with mixed features ([place])   . Bulging lumbar disc    Ruben Im, PT 02/13/21 8:58 AM Phone: [phone] Fax: [phone] Alvera Singh 02/13/2021, 8:57 AM  [facility] [address] [place], [place], [address] Phone: [phone]   Fax:  [phone]  Name: TAYTEN HEBER MRN: [id number] Date of Birth: [birth year]

## 2021-02-13 NOTE — Patient Instructions (Signed)
Access Code: [email or web address] URL: [email or web address] Date: 02/13/2021 Prepared by: Ruben Im  Exercises Supine Hip Internal and External Rotation - 1 x daily - 7 x weekly - 10 reps - 1 sets - 5 sec hold Supine Hamstring Stretch - 1 x daily - 7 x weekly - 3 reps - 1 sets - 30 sec hold Hip Adductors and Hamstring Stretch with Strap - 1 x daily - 7 x weekly - 3 reps - 1 sets - 30 sec hold Hip External Rotation Stretch - 1 x daily - 7 x weekly - 3 sets - 3 reps Supine Bridge with Pelvic Floor Contraction and Hip Rotation - 1 x daily - 7 x weekly - 1 sets - 10 reps - 5 sec hold Supine Shoulder Horizontal Abduction with Resistance - 1 x daily - 7 x weekly - 3 sets - 10 reps Mini Squat with Pelvic Floor Contraction - 1 x daily - 7 x weekly - 2 sets - 10 reps Sit to Stand with Pelvic Floor Contraction - 1 x daily - 7 x weekly - 3 sets - 10 reps Resistance Pulldown with March - 1 x daily - 7 x weekly - 3 sets - 10 reps Side Stepping with Resistance at Ankles - 1 x daily - 7 x weekly - 3 sets - 10 reps Supine Transversus Abdominis Bracing - Hands on Stomach - 1 x daily - 7 x weekly - 1 sets - 10 reps Supine Transversus Abdominis Bracing with Heel Slide - 1 x daily - 7 x weekly - 1 sets - 10 reps Sidelying Feet Elevated Clamshells - 1 x daily - 7 x weekly - 1 sets - 10 reps Quadruped on Forearms Bent Knee Hip Extension Alternating - 1 x daily - 7 x weekly - 1 sets - 10 reps Arm beats - 1 x daily - 7 x weekly - 1 sets - 10 reps Standing Hip Hinge with Dowel - 1 x daily - 7 x weekly - 3 sets - 10 reps Wall Squat with Swiss Ball - 1 x daily - 7 x weekly - 1-2 sets - 10 reps Half Dead Lift with Kettlebell - 1 x daily - 7 x weekly - 1 sets - 10 reps

## 2021-02-20 ENCOUNTER — Other Ambulatory Visit: Payer: Self-pay | Admitting: Physician Assistant

## 2021-02-21 ENCOUNTER — Other Ambulatory Visit: Payer: Self-pay

## 2021-02-21 ENCOUNTER — Ambulatory Visit (INDEPENDENT_AMBULATORY_CARE_PROVIDER_SITE_OTHER): Payer: Medicare Other | Admitting: Family Medicine

## 2021-02-21 ENCOUNTER — Encounter (INDEPENDENT_AMBULATORY_CARE_PROVIDER_SITE_OTHER): Payer: Self-pay | Admitting: Family Medicine

## 2021-02-21 VITALS — BP 113/76 | HR 77 | Temp 98.1°F | Ht 66.0 in | Wt 241.0 lb

## 2021-02-21 DIAGNOSIS — I10 Essential (primary) hypertension: Secondary | ICD-10-CM

## 2021-02-21 DIAGNOSIS — Z6841 Body Mass Index (BMI) 40.0 and over, adult: Secondary | ICD-10-CM | POA: Diagnosis not present

## 2021-02-21 DIAGNOSIS — F319 Bipolar disorder, unspecified: Secondary | ICD-10-CM

## 2021-02-21 DIAGNOSIS — R7303 Prediabetes: Secondary | ICD-10-CM | POA: Diagnosis not present

## 2021-02-22 ENCOUNTER — Ambulatory Visit
Admission: RE | Admit: 2021-02-22 | Discharge: 2021-02-22 | Disposition: A | Discharge status: Home / Self Care | Payer: Medicare Other | Source: Ambulatory Visit | Attending: Physician Assistant | Admitting: Physician Assistant

## 2021-02-22 DIAGNOSIS — Z1231 Encounter for screening mammogram for malignant neoplasm of breast: Secondary | ICD-10-CM

## 2021-02-23 ENCOUNTER — Other Ambulatory Visit: Payer: Self-pay

## 2021-02-23 ENCOUNTER — Ambulatory Visit (INDEPENDENT_AMBULATORY_CARE_PROVIDER_SITE_OTHER): Payer: Medicare Other | Admitting: Physician Assistant

## 2021-02-23 ENCOUNTER — Encounter: Payer: Self-pay | Admitting: Physician Assistant

## 2021-02-23 DIAGNOSIS — F4310 Post-traumatic stress disorder, unspecified: Secondary | ICD-10-CM

## 2021-02-23 DIAGNOSIS — F902 Attention-deficit hyperactivity disorder, combined type: Secondary | ICD-10-CM | POA: Diagnosis not present

## 2021-02-23 DIAGNOSIS — F3341 Major depressive disorder, recurrent, in partial remission: Secondary | ICD-10-CM

## 2021-02-23 DIAGNOSIS — F411 Generalized anxiety disorder: Secondary | ICD-10-CM

## 2021-02-23 MED ORDER — AMPHETAMINE-DEXTROAMPHET ER 10 MG PO CP24: 10.0000 mg | ORAL_CAPSULE | Freq: Two times a day (BID) | ORAL | 0 refills | Status: DC

## 2021-02-23 NOTE — Progress Notes (Signed)
[facility] Check  Patient ID: Crystal Bean,  MRN: [id number]  PCP: Inda Coke, PA  Date of Evaluation: 02/23/2021 Time spent:30 minutes  Chief Complaint:  Chief Complaint    Anxiety; Depression; ADD      HISTORY/CURRENT STATUS: HPI For routine med check.  On 01/02/2021 we increased the Adderall. It's helped. She has been stressed a lot though. Her daughter was in an accident yesterday, totaled her car but not seriously hurt.  She has had to take the Xanax more often past week or so.  But usually rarely takes it.  Is having some ups and downs with her mood but is much better than she was when he made changes several months ago.  She is able to enjoy things.  Energy and motivation are pretty good.  She is a weight loss program supervised by a physician, and has lost 11 pounds in the past couple of months so she is happy about that.  Not crying easily.  Denies suicidal or homicidal thoughts.  States that attention is good without easy distractibility.  Able to focus on things and finish tasks to completion.   Patient denies increased energy with decreased need for sleep, no increased talkativeness, no racing thoughts, no impulsivity or risky behaviors, no increased spending, no increased libido, no grandiosity, no increased irritability or anger, and no hallucinations.  Denies dizziness, syncope, seizures, numbness, tingling, tremor, tics, unsteady gait, slurred speech, confusion. Denies muscle or joint pain, stiffness, or dystonia.  Individual Medical History/ Review of Systems: Changes? :No   Past medications for mental health diagnoses include: Prozac, Celexa, Lexapro, Wellbutrin, Effexor XR, Pristiq, Abilify,  Adderall, Ritalin, Klonopin, Xanax, Gabapentin, Buspar  Allergies: Meloxicam and Penicillins  Current Medications:  Current Outpatient Medications:  .  ALPRAZolam (XANAX) 1 MG tablet, Take 0.5-1 tablets (0.5-1 mg total) by mouth 2 (two) times daily as needed  for anxiety. Rare, Disp: 60 tablet, Rfl: 0 .  ARIPiprazole (ABILIFY) 10 MG tablet, TAKE ONE TABLET BY MOUTH DAILY, Disp: 90 tablet, Rfl: 3 .  aspirin 81 MG chewable tablet, Chew by mouth daily., Disp: , Rfl:  .  buPROPion (WELLBUTRIN XL) 300 MG 24 hr tablet, Take 1 tablet (300 mg total) by mouth daily., Disp: 90 tablet, Rfl: 1 .  Cholecalciferol (VITAMIN D3) 10 MCG (400 UNIT) CAPS, Take by mouth., Disp: , Rfl:  .  diclofenac (VOLTAREN) 75 MG EC tablet, Take 1 tablet (75 mg total) by mouth 2 (two) times daily., Disp: 60 tablet, Rfl: 0 .  esomeprazole (NEXIUM) 20 MG capsule, Take 20 mg by mouth daily at 12 noon., Disp: , Rfl:  .  gabapentin (NEURONTIN) 100 MG capsule, TAKE 1 CAPSULE(S) BY MOUTH DAILY . MAY INCRASE TO 3 CAPSULES DAILY AS TOLERATED, Disp: 90 capsule, Rfl: 0 .  losartan (COZAAR) 50 MG tablet, Take 1 tablet by mouth once daily, Disp: 90 tablet, Rfl: 0 .  Magnesium 250 MG TABS, Take by mouth., Disp: , Rfl:  .  metFORMIN (GLUCOPHAGE-XR) 500 MG 24 hr tablet, TAKE TWO TABLETS BY MOUTH EVERY MORNING WITH BREAKFAST, Disp: 60 tablet, Rfl: 0 .  Multiple Vitamin (MULTIVITAMIN) tablet, Take 1 tablet by mouth daily., Disp: , Rfl:  .  triamcinolone ointment (KENALOG) 0.5 %, Apply 1 application topically 2 (two) times daily., Disp: 30 g, Rfl: 1 .  venlafaxine XR (EFFEXOR-XR) 150 MG 24 hr capsule, Take 2 capsules (300 mg total) by mouth daily with breakfast., Disp: 180 capsule, Rfl: 1 .  [START ON 03/10/2021] amphetamine-dextroamphetamine (  ADDERALL XR) 10 MG 24 hr capsule, Take 1 capsule (10 mg total) by mouth 2 (two) times daily with breakfast and lunch., Disp: 60 capsule, Rfl: 0 .  [START ON 05/08/2021] amphetamine-dextroamphetamine (ADDERALL XR) 10 MG 24 hr capsule, Take 1 capsule (10 mg total) by mouth 2 (two) times daily with breakfast and lunch., Disp: 60 capsule, Rfl: 0 .  [START ON 04/08/2021] amphetamine-dextroamphetamine (ADDERALL XR) 10 MG 24 hr capsule, Take 1 capsule (10 mg total) by mouth 2  (two) times daily with breakfast and lunch., Disp: 60 capsule, Rfl: 0 Medication Side Effects: none  Family Medical/ Social History: Changes? Yes see above  MENTAL HEALTH EXAM:  Last menstrual period 02/19/2021.There is no height or weight on file to calculate BMI.  General Appearance: Casual, Well Groomed and Obese  Eye Contact:  Good  Speech:  Clear and Coherent and Normal Rate  Volume:  Normal  Mood:  Euthymic  Affect:  Appropriate  Thought Process:  Goal Directed and Descriptions of Associations: Intact  Orientation:  Full (Time, Place, and Person)  Thought Content: Logical   Suicidal Thoughts:  No  Homicidal Thoughts:  No  Memory:  WNL  Judgement:  Good  Insight:  Good  Psychomotor Activity:  Normal  Concentration:  Concentration: Good and Attention Span: Good  Recall:  Good  Fund of Knowledge: Good  Language: Good  Assets:  Desire for Improvement  ADL's:  Intact  Cognition: WNL  Prognosis:  Good   Most recent pertinent labs: 01/02/2021 Glucose was 91, creatinine was slightly elevated at 1.03. Hemoglobin A1c 5.9 Total cholesterol 227, triglycerides 243, HDL 50, LDL 134 Vitamin D 42.2 TSH 2.410.   DIAGNOSES:    ICD-10-CM   1. Recurrent major depressive disorder, in partial remission ([place])  F33.41   2. Generalized anxiety disorder  F41.1   3. PTSD (post-traumatic stress disorder)  F43.10   4. Attention deficit hyperactivity disorder (ADHD), combined type  F90.2     Receiving Psychotherapy: Yes  Leane Platt    RECOMMENDATIONS:  PDMP was reviewed. I provided 30 minutes of face-to-face time during this encounter, including time spent before and after the visit and records review and charting. She is doing well for the most part so no changes will be made. Continue Xanax 1 mg, 1/2-1 twice daily as needed.  She rarely takes it up until the past week or so she has needed it more. Continue Adderall XR 10 mg, 1 p.o. at breakfast and 1 p.o. at lunch. Continue  Abilify 10 mg, 1 p.o. every morning. Continue Wellbutrin XL 300 mg, 1 p.o. daily. Continue gabapentin 100 mg, 1-3 daily as directed. Continue Effexor XR 150 mg, 2 p.o. every morning. Continue therapy. Return in 3 months.   Donnal Moat, PA-C

## 2021-02-25 ENCOUNTER — Other Ambulatory Visit: Payer: Self-pay | Admitting: Physician Assistant

## 2021-02-26 ENCOUNTER — Other Ambulatory Visit: Payer: Self-pay | Admitting: Physician Assistant

## 2021-02-26 NOTE — Telephone Encounter (Signed)
Controlled substance 

## 2021-02-27 NOTE — Progress Notes (Signed)
Chief Complaint:   OBESITY Crystal Bean is here to discuss her progress with her obesity treatment plan along with follow-up of her obesity related diagnoses.   Today's visit was #: 4 Starting weight: 252 lbs Starting date: 01/02/2021 Today's weight: 241 lbs Today's date: 02/21/2021 Total lbs lost to date: 11 lbs Body mass index is 38.9 kg/m.  Total weight loss percentage to date: -4.37%  Interim History: Crystal Bean says her craving have decreased. She is getting between 44-45 grams of protein and is skipping lunch. We discussed protein shakes for lunch. Current Meal Plan: the Category 3 Plan for 90% of the time.  Current Exercise Plan: PT for 30 minutes 5 times per week.  Assessment/Plan:   1. Pre-diabetes Not at goal. Goal is HgbA1c < 5.7.  Medication: Metformin 500 mg twice a day.  Plan:  She will continue to focus on protein-rich, low simple carbohydrate foods. We reviewed the importance of hydration, regular exercise for stress reduction, and restorative sleep.   Lab Results  Component Value Date   HGBA1C 5.9 (H) 01/02/2021   Lab Results  Component Value Date   INSULIN 14.4 01/02/2021   2. Essential hypertension Controlled. Medications: Losartan 50 mg daily.   Plan: Avoid buying foods that are: processed, frozen, or prepackaged to avoid excess salt. We will continue to monitor closely alongside her PCP and/or Specialist.    BP Readings from Last 3 Encounters:  02/21/21 113/76  02/09/21 128/78  02/07/21 109/72   Lab Results  Component Value Date   CREATININE 1.03 (H) 01/02/2021   3. Bipolar 1 disorder ([place]), with emotional eating She is taking Effexor XR 300 mg daily, Abilify 10 mg daily, and Wellbutrin XL 300 mg daily.  Behavior modification techniques were discussed today to help Crystal Bean deal with her emotional/non-hunger eating behaviors.  4. Obesity, current BMI 39  Course: Crystal Bean is currently in the action stage of change. As such, her goal is to continue with weight  loss efforts.   Nutrition goals: She has agreed to the Category 2 Plan.   Exercise goals: As is with increased activity  Behavioral modification strategies: decreasing simple carbohydrates, increasing vegetables, increasing water intake, decreasing liquid calories and decreasing alcohol intake.  Crystal Bean has agreed to follow-up with our clinic in 4 weeks. She was informed of the importance of frequent follow-up visits to maximize her success with intensive lifestyle modifications for her multiple health conditions.   Objective:   Blood pressure 113/76, pulse 77, temperature 98.1 F (36.7 C), temperature source Oral, height 5\' 6"  (1.676 m), weight 241 lb (109.3 kg), last menstrual period 02/19/2021, SpO2 95 %. Body mass index is 38.9 kg/m.  General: Cooperative, alert, well developed, in no acute distress. HEENT: Conjunctivae and lids unremarkable. Cardiovascular: Regular rhythm.  Lungs: Normal work of breathing. Neurologic: No focal deficits.   Lab Results  Component Value Date   CREATININE 1.03 (H) 01/02/2021   BUN 10 01/02/2021   NA 139 01/02/2021   K 5.0 01/02/2021   CL 95 (L) 01/02/2021   CO2 25 01/02/2021   Lab Results  Component Value Date   ALT 24 01/02/2021   AST 16 01/02/2021   ALKPHOS 89 01/02/2021   BILITOT 0.2 01/02/2021   Lab Results  Component Value Date   HGBA1C 5.9 (H) 01/02/2021   HGBA1C 5.7 (H) 09/20/2020   HGBA1C 6.0 10/18/2019   HGBA1C 6.2 08/02/2019   HGBA1C 5.9 04/08/2018   Lab Results  Component Value Date   INSULIN 14.4  01/02/2021   Lab Results  Component Value Date   TSH 2.410 01/02/2021   Lab Results  Component Value Date   CHOL 227 (H) 01/02/2021   HDL 50 01/02/2021   LDLCALC 134 (H) 01/02/2021   LDLDIRECT 111.0 10/18/2019   TRIG 243 (H) 01/02/2021   CHOLHDL 4.5 (H) 01/02/2021   Lab Results  Component Value Date   WBC 13.1 (H) 01/02/2021   HGB 13.5 01/02/2021   HCT 42.8 01/02/2021   MCV 88 01/02/2021   PLT 543 (H) 01/02/2021    Obesity Behavioral Intervention:   Approximately 15 minutes were spent on the discussion below.  ASK: We discussed the diagnosis of obesity with Soma today and Monie agreed to give [place] permission to discuss obesity behavioral modification therapy today.  ASSESS: Crystal Bean has the diagnosis of obesity and her BMI today is 38.9. Crystal Bean is in the action stage of change.   ADVISE: Crystal Bean was educated on the multiple health risks of obesity as well as the benefit of weight loss to improve her health. She was advised of the need for long term treatment and the importance of lifestyle modifications to improve her current health and to decrease her risk of future health problems.  AGREE: Multiple dietary modification options and treatment options were discussed and Crystal Bean agreed to follow the recommendations documented in the above note.  ARRANGE: Crystal Bean was educated on the importance of frequent visits to treat obesity as outlined per CMS and USPSTF guidelines and agreed to schedule her next follow up appointment today.  Attestation Statements:   Reviewed by clinician on day of visit: allergies, medications, problem list, medical history, surgical history, family history, social history, and previous encounter notes.  Crystal Bean, CMA, am acting as Location manager for [employer], DO.  I have reviewed the above documentation for accuracy and completeness, and I agree with the above. Crystal Deutscher, DO

## 2021-03-05 ENCOUNTER — Other Ambulatory Visit: Payer: Self-pay

## 2021-03-05 ENCOUNTER — Encounter (INDEPENDENT_AMBULATORY_CARE_PROVIDER_SITE_OTHER): Payer: Self-pay | Admitting: Family Medicine

## 2021-03-05 ENCOUNTER — Ambulatory Visit (INDEPENDENT_AMBULATORY_CARE_PROVIDER_SITE_OTHER): Payer: Medicare Other | Admitting: Family Medicine

## 2021-03-05 VITALS — BP 104/67 | HR 72 | Temp 97.8°F | Ht 66.0 in | Wt 241.0 lb

## 2021-03-05 DIAGNOSIS — R7303 Prediabetes: Secondary | ICD-10-CM

## 2021-03-05 DIAGNOSIS — Z6841 Body Mass Index (BMI) 40.0 and over, adult: Secondary | ICD-10-CM

## 2021-03-06 NOTE — Progress Notes (Signed)
Chief Complaint:   OBESITY Crystal Bean is here to discuss her progress with her obesity treatment plan along with follow-up of her obesity related diagnoses. Crystal Bean is on the Category 2 Plan and states she is following her eating plan approximately 90% of the time. Crystal Bean states she is not exercising regularly at this time.  Today's visit was #: 5 Starting weight: 252 lbs Starting date: 01/02/2021 Today's weight: 241 lbs Today's date: 03/05/2021 Total lbs lost to date: 11 lbs Total lbs lost since last in-office visit: 0  Interim History: Indica is still forgetting to eat lunch.  She is on Adderall for ADHD, which curbs her appetite.  She is over-hungry at dinner but notes that shedoes not overeat.   Subjective:   1. Pre-diabetes She denies polyphagia, but does get over-hungry at supper after skipping lunch.  On metformin XR 500 mg twice daily.  Lab Results  Component Value Date   HGBA1C 5.9 (H) 01/02/2021   Lab Results  Component Value Date   INSULIN 14.4 01/02/2021   Assessment/Plan:   1. Pre-diabetes Continue metformin.  2. Obesity, current BMI 38  Crystal Bean is currently in the action stage of change. As such, her goal is to continue with weight loss efforts. She has agreed to the Category 2 Plan and keeping a food journal and adhering to recommended goals of 400-500 calories and 35+ grams of protein at supper.   Exercise goals: All adults should avoid inactivity. Some physical activity is better than none, and adults who participate in any amount of physical activity gain some health benefits.  Behavioral modification strategies: increasing lean protein intake.  She will set an alarm for lunch.    Handout provided:  Recipes.  Crystal Bean has agreed to follow-up with our clinic in 3 weeks with Dr. Juleen China and in 6 weeks with [facility]   Objective:   Blood pressure 104/67, pulse 72, temperature 97.8 F (36.6 C), height 5\' 6"  (1.676 m), weight 241 lb (109.3 kg), last menstrual period  02/19/2021, SpO2 96 %. Body mass index is 38.9 kg/m.  General: Cooperative, alert, well developed, in no acute distress. HEENT: Conjunctivae and lids unremarkable. Cardiovascular: Regular rhythm.  Lungs: Normal work of breathing. Neurologic: No focal deficits.   Lab Results  Component Value Date   CREATININE 1.03 (H) 01/02/2021   BUN 10 01/02/2021   NA 139 01/02/2021   K 5.0 01/02/2021   CL 95 (L) 01/02/2021   CO2 25 01/02/2021   Lab Results  Component Value Date   ALT 24 01/02/2021   AST 16 01/02/2021   ALKPHOS 89 01/02/2021   BILITOT 0.2 01/02/2021   Lab Results  Component Value Date   HGBA1C 5.9 (H) 01/02/2021   HGBA1C 5.7 (H) 09/20/2020   HGBA1C 6.0 10/18/2019   HGBA1C 6.2 08/02/2019   HGBA1C 5.9 04/08/2018   Lab Results  Component Value Date   INSULIN 14.4 01/02/2021   Lab Results  Component Value Date   TSH 2.410 01/02/2021   Lab Results  Component Value Date   CHOL 227 (H) 01/02/2021   HDL 50 01/02/2021   LDLCALC 134 (H) 01/02/2021   LDLDIRECT 111.0 10/18/2019   TRIG 243 (H) 01/02/2021   CHOLHDL 4.5 (H) 01/02/2021   Lab Results  Component Value Date   WBC 13.1 (H) 01/02/2021   HGB 13.5 01/02/2021   HCT 42.8 01/02/2021   MCV 88 01/02/2021   PLT 543 (H) 01/02/2021   Attestation Statements:   Reviewed by clinician on  day of visit: allergies, medications, problem list, medical history, surgical history, family history, social history, and previous encounter notes.  I, Water quality scientist, CMA, am acting as Location manager for [employer], [place].  I have reviewed the above documentation for accuracy and completeness, and I agree with the above. -  Georgianne Fick, FNP

## 2021-03-15 ENCOUNTER — Other Ambulatory Visit: Payer: Self-pay

## 2021-03-15 ENCOUNTER — Ambulatory Visit: Payer: Medicare Other | Attending: Physician Assistant | Admitting: Physical Therapy

## 2021-03-15 DIAGNOSIS — R293 Abnormal posture: Secondary | ICD-10-CM | POA: Diagnosis present

## 2021-03-15 DIAGNOSIS — M545 Low back pain, unspecified: Secondary | ICD-10-CM | POA: Insufficient documentation

## 2021-03-15 DIAGNOSIS — G8929 Other chronic pain: Secondary | ICD-10-CM | POA: Diagnosis present

## 2021-03-15 DIAGNOSIS — M6281 Muscle weakness (generalized): Secondary | ICD-10-CM

## 2021-03-15 NOTE — Therapy (Signed)
[facility] [address], [place], [place], [address] Phone: [phone]   Fax:  [phone]  Physical Therapy Treatment/Recertification   Patient Details  Name: Crystal Bean MRN: [id number] Date of Birth: 06/01/1976 Referring Provider (PT): Inda Coke, [place]   Encounter Date: 03/15/2021   PT End of Session - 03/15/21 1959    Visit Number 8    Date for PT Re-Evaluation 05/10/21    Authorization Type medicare A/B    PT Start Time 0935    PT Stop Time 1015    PT Time Calculation (min) 40 min    Activity Tolerance Patient tolerated treatment well           Past Medical History:  Diagnosis Date  . ADHD (attention deficit hyperactivity disorder)   . Anxiety   . B12 deficiency   . Back pain   . Bipolar 1 disorder ([place])   . Bulging lumbar disc   . C. difficile diarrhea 04/24/2016   treated with metronidazole  . Constipation   . Depression   . GERD (gastroesophageal reflux disease)   . History of chicken pox   . History of shingles 2013  . Hypertension   . IBS (irritable bowel syndrome)   . Joint pain   . Major depressive disorder   . Migraines   . Obesity   . Obsessive-compulsive disorder   . Osteoarthritis   . Prediabetes   . PTSD (post-traumatic stress disorder)   . Sleep apnea   . SOB (shortness of breath)     Past Surgical History:  Procedure Laterality Date  . BLADDER SURGERY  1981   Urethera stretched  . caps on teeth  1980   Caps on all Teeth  . CESAREAN SECTION  2003  . TONSILLECTOMY    . WISDOM TOOTH EXTRACTION      There were no vitals filed for this visit.   Subjective Assessment - 03/15/21 0936    Subjective I hurt my back cleaning the house last week.  My daughter is coming home from college next week.  I'll be better with my exercises when she comes home.    Currently in Pain? Yes    Pain Score 4     Pain Location Back    Pain Type Chronic pain              [facility]  PT Assessment - 03/15/21 0001      Assessment   Medical Diagnosis M19.90 (ICD-10-CM) - Osteoarthritis, unspecified osteoarthritis type, unspecified site    Referring Provider (PT) Inda Coke, PA      Observation/Other Assessments   Focus on Therapeutic Outcomes (FOTO)  46%      ROM / Strength   AROM / PROM / Strength AROM      AROM   Overall AROM Comments flexion 50, ext 20, 30 bil sidebend      Strength   Overall Strength Comments left hip abd 4+/5, right 5/5; hip extension 4+/5 bil; abdominals 4/5      Flexibility   Hamstrings 85 bil      6 Minute Walk- Baseline   6 Minute Walk- Baseline yes    Perceived Rate of Exertion ([place]) 7- Very, very light      6 minute walk test results    Endurance additional comments 1,080                         [facility] Adult PT  Treatment/Exercise - 03/15/21 0001      Ambulation/Gait   Gait Comments 6 min walk test      Lumbar Exercises: Standing   Other Standing Lumbar Exercises squats 10x with cues for hip hinge    Other Standing Lumbar Exercises counter top push ups 15x      Lumbar Exercises: Seated   Other Seated Lumbar Exercises review of current HEP                  PT Education - 03/15/21 1014    Education Details counter top push ups; walking program    Person(s) Educated Patient    Methods Explanation;Demonstration;Handout    Comprehension Returned demonstration;Verbalized understanding            PT Short Term Goals - 03/15/21 2006      PT SHORT TERM GOAL #1   Title pt will be able to walk around the grocery store without feeling like she has to leave due to pain    Status Achieved             PT Long Term Goals - 03/15/21 0956      PT LONG TERM GOAL #1   Title Pt will be ind with HEP for maintaining strength gains    Period Weeks    Status On-going    Target Date 05/10/21      PT LONG TERM GOAL #2   Title Pt will be able to load and unloading the dishwasher without stopping  due to pain    Status Achieved      PT LONG TERM GOAL #3   Title Pt will be able to perform 10 squats with good mechanics and no breath holding for functional activities without pain    Status Achieved      PT LONG TERM GOAL #4   Title Pt will be able to do all household chores with 70% less pain due to improved core strength and stability    Time 8    Period Weeks    Status On-going      PT LONG TERM GOAL #5   Title Pt will have improved FOTO score of 52 based on suggested outcomes    Time 8    Period Weeks    Status On-going      Additional Long Term Goals   Additional Long Term Goals Yes      PT LONG TERM GOAL #6   Title Able to walk 1 mile with her daughter in the neighborhood    Time 8    Period Weeks    Status New      PT LONG TERM GOAL #7   Title The patient will be do kitchen clean up/general cleaning for 30 minutes    Time 8    Period Weeks    Status New      PT LONG TERM GOAL #8   Title Six min walk test improved to 1250 feet    Time 8    Period Weeks    Status New                 Plan - 03/15/21 1010    Clinical Impression Statement The patient previously rated her progress at 60% but reports a set back after cleaning the house.  Lumbar ROM improving as well as bilateral hip strength.  She reports functional improvements like greater ease with  unloading the dishwasher although she is limited to household chores in about  15-20 min increments.  Her FOTO functional outcome score has improved from 42% to 46%.  Six minute walk test of 1,080 feet.  She has a goal of increasing her walking capacity to 1 mile.  She would benefit from additional 6-8 visits for further functional strengthening to improve her quality of life    Personal Factors and Comorbidities Time since onset of injury/illness/exacerbation;Comorbidity 1    Examination-Activity Limitations Bend;Lift    Examination-Participation Restrictions Community Activity    Stability/Clinical Decision  Making Stable/Uncomplicated    Clinical Decision Making Low    Rehab Potential Good    PT Frequency 1x / week    PT Duration 8 weeks    PT Treatment/Interventions ADLs/Self Care Home Management;Biofeedback;Cryotherapy;Therapeutic exercise;Neuromuscular re-education;Therapeutic activities;Patient/family education;Manual techniques;Dry needling;Passive range of motion;Taping    PT Next Visit Plan monitor walking program for goal of 1 mile;  functional strengthening in standing; hip hinge to dead lift progression;  lumbo/pelvic/hip strengthening    PT Home Exercise Plan Access Code: [email or web address]           Patient will benefit from skilled therapeutic intervention in order to improve the following deficits and impairments:  Impaired flexibility,Decreased range of motion,Increased fascial restricitons,Postural dysfunction,Decreased endurance,Decreased strength  Visit Diagnosis: Chronic bilateral low back pain, unspecified whether sciatica present - Plan: PT plan of care cert/re-cert  Muscle weakness (generalized) - Plan: PT plan of care cert/re-cert  Abnormal posture - Plan: PT plan of care cert/re-cert     Problem List Patient Active Problem List   Diagnosis Date Noted  . Pre-diabetes 02/12/2021  . Erythema nodosum 12/26/2020  . Excessive daytime sleepiness 10/30/2020  . Snoring 10/30/2020  . Nocturia more than twice per night 10/30/2020  . ADHD, adult residual type 10/30/2020  . 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 Obsessive compulsive disorder 09/08/2018  . Bipolar 1 disorder ([place]) 08/26/2018  . Obesity 04/08/2018  . Essential hypertension 04/08/2018  . PTSD (post-traumatic stress disorder)   . Osteoarthritis   . Migraines   . GERD (gastroesophageal reflux disease)   . Major depressive disorder, recurrent episode with mixed features ([place])   . Bulging lumbar disc    Ruben Im, PT 03/15/21 8:11 PM Phone: [phone] Fax: [phone] Alvera Singh 03/15/2021, 8:11 PM  [facility] [address], [place], [place], [address] Phone: [phone]   Fax:  [phone]  Name: TAM SAVOIA MRN: [id number] Date of Birth: 01-12-1976

## 2021-03-15 NOTE — Patient Instructions (Signed)
Access Code: [email or web address] URL: [email or web address] Date: 03/15/2021 Prepared by: Ruben Im  Exercises Supine Hip Internal and External Rotation - 1 x daily - 7 x weekly - 10 reps - 1 sets - 5 sec hold Supine Hamstring Stretch - 1 x daily - 7 x weekly - 3 reps - 1 sets - 30 sec hold Hip Adductors and Hamstring Stretch with Strap - 1 x daily - 7 x weekly - 3 reps - 1 sets - 30 sec hold Hip External Rotation Stretch - 1 x daily - 7 x weekly - 3 sets - 3 reps Supine Bridge with Pelvic Floor Contraction and Hip Rotation - 1 x daily - 7 x weekly - 1 sets - 10 reps - 5 sec hold Supine Shoulder Horizontal Abduction with Resistance - 1 x daily - 7 x weekly - 3 sets - 10 reps Mini Squat with Pelvic Floor Contraction - 1 x daily - 7 x weekly - 2 sets - 10 reps Sit to Stand with Pelvic Floor Contraction - 1 x daily - 7 x weekly - 3 sets - 10 reps Resistance Pulldown with March - 1 x daily - 7 x weekly - 3 sets - 10 reps Side Stepping with Resistance at Ankles - 1 x daily - 7 x weekly - 3 sets - 10 reps Supine Transversus Abdominis Bracing - Hands on Stomach - 1 x daily - 7 x weekly - 1 sets - 10 reps Supine Transversus Abdominis Bracing with Heel Slide - 1 x daily - 7 x weekly - 1 sets - 10 reps Sidelying Feet Elevated Clamshells - 1 x daily - 7 x weekly - 1 sets - 10 reps Quadruped on Forearms Bent Knee Hip Extension Alternating - 1 x daily - 7 x weekly - 1 sets - 10 reps Arm beats - 1 x daily - 7 x weekly - 1 sets - 10 reps Standing Hip Hinge with Dowel - 1 x daily - 7 x weekly - 3 sets - 10 reps Wall Squat with Swiss Ball - 1 x daily - 7 x weekly - 1-2 sets - 10 reps Half Dead Lift with Kettlebell - 1 x daily - 7 x weekly - 1 sets - 10 reps Push Up on Table - 1 x daily - 7 x weekly - 2 sets - 10 reps

## 2021-03-22 ENCOUNTER — Ambulatory Visit: Payer: Medicare Other | Attending: Physician Assistant | Admitting: Physical Therapy

## 2021-03-22 ENCOUNTER — Other Ambulatory Visit: Payer: Self-pay

## 2021-03-22 DIAGNOSIS — G8929 Other chronic pain: Secondary | ICD-10-CM | POA: Diagnosis present

## 2021-03-22 DIAGNOSIS — M6281 Muscle weakness (generalized): Secondary | ICD-10-CM | POA: Diagnosis present

## 2021-03-22 DIAGNOSIS — M545 Low back pain, unspecified: Secondary | ICD-10-CM | POA: Insufficient documentation

## 2021-03-22 DIAGNOSIS — R293 Abnormal posture: Secondary | ICD-10-CM

## 2021-03-22 NOTE — Therapy (Signed)
[facility] [address] [place], [place], [address] Phone: [phone]   Fax:  [phone]  Physical Therapy Treatment  Patient Details  Name: Crystal Bean MRN: [id number] Date of Birth: 1976/07/16 Referring Provider (PT): Inda Coke, [place]   Encounter Date: 03/22/2021   PT End of Session - 03/22/21 1725    Visit Number 9    Date for PT Re-Evaluation 05/10/21    Authorization Type medicare A/B    PT Start Time 0800    PT Stop Time 0842    PT Time Calculation (min) 42 min    Activity Tolerance Patient tolerated treatment well           Past Medical History:  Diagnosis Date  . ADHD (attention deficit hyperactivity disorder)   . Anxiety   . B12 deficiency   . Back pain   . Bipolar 1 disorder ([place])   . Bulging lumbar disc   . C. difficile diarrhea 04/24/2016   treated with metronidazole  . Constipation   . Depression   . GERD (gastroesophageal reflux disease)   . History of chicken pox   . History of shingles 2013  . Hypertension   . IBS (irritable bowel syndrome)   . Joint pain   . Major depressive disorder   . Migraines   . Obesity   . Obsessive-compulsive disorder   . Osteoarthritis   . Prediabetes   . PTSD (post-traumatic stress disorder)   . Sleep apnea   . SOB (shortness of breath)     Past Surgical History:  Procedure Laterality Date  . BLADDER SURGERY  1981   Urethera stretched  . caps on teeth  1980   Caps on all Teeth  . CESAREAN SECTION  2003  . TONSILLECTOMY    . WISDOM TOOTH EXTRACTION      There were no vitals filed for this visit.   Subjective Assessment - 03/22/21 0802    Subjective I'm thinking of joining [employer] with my daughter.  I strained my back this weekend lifting and moving my daughter's stuff.  Going for a tour of [employer] today.    Patient Stated Goals be able to do chores without pain and be able to walk 1 mile    Currently in Pain? Yes    Pain  Score 4     Pain Location Back    Pain Orientation Lower    Pain Descriptors / Indicators Sore                             [facility] Adult PT Treatment/Exercise - 03/22/21 0001      Therapeutic Activites    ADL's single leg/golfers lift to touch 2nd step 8x right/left      Lumbar Exercises: Stretches   Other Lumbar Stretch Exercise psoas doorway with UE movements 15x right/left      Lumbar Exercises: Aerobic   Nustep L3 10 min while discussing progress      Lumbar Exercises: Standing   Other Standing Lumbar Exercises 3 way towel slides 8x right/left    Other Standing Lumbar Exercises wall ex: snow angels, UE flexion, face wall UE flex/lift offs; wall push ups 8x each      Knee/Hip Exercises: Standing   Other Standing Knee Exercises glute med stand tall 8x right/left    Other Standing Knee Exercises green band side step and hip extension/abduction  PT Education - 03/22/21 1724    Education Details side step with band; standing banded clams and/or toe taps lateral and posterior    Person(s) Educated Patient    Methods Explanation;Demonstration;Handout    Comprehension Returned demonstration;Verbalized understanding            PT Short Term Goals - 03/15/21 2006      PT SHORT TERM GOAL #1   Title pt will be able to walk around the grocery store without feeling like she has to leave due to pain    Status Achieved             PT Long Term Goals - 03/15/21 0956      PT LONG TERM GOAL #1   Title Pt will be ind with HEP for maintaining strength gains    Period Weeks    Status On-going    Target Date 05/10/21      PT LONG TERM GOAL #2   Title Pt will be able to load and unloading the dishwasher without stopping due to pain    Status Achieved      PT LONG TERM GOAL #3   Title Pt will be able to perform 10 squats with good mechanics and no breath holding for functional activities without pain    Status Achieved      PT LONG  TERM GOAL #4   Title Pt will be able to do all household chores with 70% less pain due to improved core strength and stability    Time 8    Period Weeks    Status On-going      PT LONG TERM GOAL #5   Title Pt will have improved FOTO score of 52 based on suggested outcomes    Time 8    Period Weeks    Status On-going      Additional Long Term Goals   Additional Long Term Goals Yes      PT LONG TERM GOAL #6   Title Able to walk 1 mile with her daughter in the neighborhood    Time 8    Period Weeks    Status New      PT LONG TERM GOAL #7   Title The patient will be do kitchen clean up/general cleaning for 30 minutes    Time 8    Period Weeks    Status New      PT LONG TERM GOAL #8   Title Six min walk test improved to 1250 feet    Time 8    Period Weeks    Status New                 Plan - 03/22/21 1725    Clinical Impression Statement The patient arrives with report of strained back muscles attributes to moving/lifting over the weekend.  She is able to continue with a progression of lumbo/pelvic/hip core strengthening exercises with a focus on gluteals.  Left > right glute medius weakness noted.  Verbal cues for hip hinge for optimum muscle activation.  Patient able to self correct several times to avoid knee hyperextension.  She reports post session "feeling better" with reduced LBP.    Personal Factors and Comorbidities Time since onset of injury/illness/exacerbation;Comorbidity 1    Rehab Potential Good    PT Frequency 1x / week    PT Duration 8 weeks    PT Treatment/Interventions ADLs/Self Care Home Management;Biofeedback;Cryotherapy;Therapeutic exercise;Neuromuscular re-education;Therapeutic activities;Patient/family education;Manual techniques;Dry needling;Passive range of motion;Taping  PT Next Visit Plan 10th visit progress note;  monitor walking program for goal of 1 mile;  functional strengthening in standing; hip hinge to dead lift progression;   lumbo/pelvic/hip strengthening    PT Home Exercise Plan Access Code: [email or web address]    Consulted and Agree with Plan of Care Patient           Patient will benefit from skilled therapeutic intervention in order to improve the following deficits and impairments:  Impaired flexibility,Decreased range of motion,Increased fascial restricitons,Postural dysfunction,Decreased endurance,Decreased strength  Visit Diagnosis: Chronic bilateral low back pain, unspecified whether sciatica present  Muscle weakness (generalized)  Abnormal posture     Problem List Patient Active Problem List   Diagnosis Date Noted  . Pre-diabetes 02/12/2021  . Erythema nodosum 12/26/2020  . Excessive daytime sleepiness 10/30/2020  . Snoring 10/30/2020  . Nocturia more than twice per night 10/30/2020  . ADHD, adult residual type 10/30/2020  . 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 Obsessive compulsive disorder 09/08/2018  . Bipolar 1 disorder ([place]) 08/26/2018  . Obesity 04/08/2018  . Essential hypertension 04/08/2018  . PTSD (post-traumatic stress disorder)   . Osteoarthritis   . Migraines   . GERD (gastroesophageal reflux disease)   . Major depressive disorder, recurrent episode with mixed features ([place])   . Bulging lumbar disc    Ruben Im, PT 03/22/21 5:30 PM Phone: [phone] Fax: [phone] Alvera Singh 03/22/2021, 5:29 PM  [facility] [address] [place], [place], [address] Phone: [phone]   Fax:  [phone]  Name: Crystal Bean MRN: [id number] Date of Birth: 1976-04-21

## 2021-03-22 NOTE — Patient Instructions (Signed)
Access Code: [email or web address] URL: [email or web address] Date: 03/22/2021 Prepared by: Ruben Im  Exercises Supine Hip Internal and External Rotation - 1 x daily - 7 x weekly - 10 reps - 1 sets - 5 sec hold Supine Hamstring Stretch - 1 x daily - 7 x weekly - 3 reps - 1 sets - 30 sec hold Hip Adductors and Hamstring Stretch with Strap - 1 x daily - 7 x weekly - 3 reps - 1 sets - 30 sec hold Hip External Rotation Stretch - 1 x daily - 7 x weekly - 3 sets - 3 reps Supine Bridge with Pelvic Floor Contraction and Hip Rotation - 1 x daily - 7 x weekly - 1 sets - 10 reps - 5 sec hold Supine Shoulder Horizontal Abduction with Resistance - 1 x daily - 7 x weekly - 3 sets - 10 reps Mini Squat with Pelvic Floor Contraction - 1 x daily - 7 x weekly - 2 sets - 10 reps Sit to Stand with Pelvic Floor Contraction - 1 x daily - 7 x weekly - 3 sets - 10 reps Resistance Pulldown with March - 1 x daily - 7 x weekly - 3 sets - 10 reps Side Stepping with Resistance at Ankles - 1 x daily - 7 x weekly - 3 sets - 10 reps Supine Transversus Abdominis Bracing - Hands on Stomach - 1 x daily - 7 x weekly - 1 sets - 10 reps Supine Transversus Abdominis Bracing with Heel Slide - 1 x daily - 7 x weekly - 1 sets - 10 reps Sidelying Feet Elevated Clamshells - 1 x daily - 7 x weekly - 1 sets - 10 reps Quadruped on Forearms Bent Knee Hip Extension Alternating - 1 x daily - 7 x weekly - 1 sets - 10 reps Arm beats - 1 x daily - 7 x weekly - 1 sets - 10 reps Standing Hip Hinge with Dowel - 1 x daily - 7 x weekly - 3 sets - 10 reps Wall Squat with Swiss Ball - 1 x daily - 7 x weekly - 1-2 sets - 10 reps Half Dead Lift with Kettlebell - 1 x daily - 7 x weekly - 1 sets - 10 reps Push Up on Table - 1 x daily - 7 x weekly - 2 sets - 10 reps Sidestepping in Squat with Resistance and Arms Forward - 1 x daily - 7 x weekly - 1 sets - 10 reps Standing Clam with Resistance Loop - 1 x daily - 7 x weekly - 1 sets - 10 reps

## 2021-03-23 ENCOUNTER — Other Ambulatory Visit: Payer: Self-pay | Admitting: Physician Assistant

## 2021-03-26 ENCOUNTER — Other Ambulatory Visit: Payer: Self-pay

## 2021-03-26 ENCOUNTER — Encounter (INDEPENDENT_AMBULATORY_CARE_PROVIDER_SITE_OTHER): Payer: Self-pay | Admitting: Family Medicine

## 2021-03-26 ENCOUNTER — Ambulatory Visit (INDEPENDENT_AMBULATORY_CARE_PROVIDER_SITE_OTHER): Payer: Medicare Other | Admitting: Family Medicine

## 2021-03-26 VITALS — BP 111/74 | HR 70 | Temp 98.4°F | Ht 66.0 in | Wt 238.0 lb

## 2021-03-26 DIAGNOSIS — E782 Mixed hyperlipidemia: Secondary | ICD-10-CM

## 2021-03-26 DIAGNOSIS — R7303 Prediabetes: Secondary | ICD-10-CM | POA: Diagnosis not present

## 2021-03-26 DIAGNOSIS — Z6841 Body Mass Index (BMI) 40.0 and over, adult: Secondary | ICD-10-CM

## 2021-03-26 DIAGNOSIS — F319 Bipolar disorder, unspecified: Secondary | ICD-10-CM

## 2021-03-26 NOTE — Progress Notes (Signed)
Chief Complaint:   OBESITY Crystal Bean is here to discuss her progress with her obesity treatment plan along with follow-up of her obesity related diagnoses.   Today's visit was #: 6 Starting weight: 252 lbs Starting date: 01/02/2021 Today's weight: 238 lbs Today's date: 03/26/2021 Total lbs lost to date: 14 lbs Body mass index is 38.41 kg/m.  Total weight loss percentage to date: -5.56%  Interim History:  Crystal Bean has been drinking 1/3 gallon of half & half sweet tea.  She sets an alarm for lunch now.  Denies polyphagia.   Current Meal Plan: the Category 2 Plan and keeping a food journal and adhering to recommended goals of 400-500 calories and 35+ grams of protein at supper for 90% of the time.  Current Exercise Plan: PT for 40 minutes 1 time per week.  Assessment/Plan:   1. Pre-diabetes Not at goal. Goal is HgbA1c < 5.7.  Medication: metformin 1,000 mg every morning.    Plan:  She will continue to focus on protein-rich, low simple carbohydrate foods. We reviewed the importance of hydration, regular exercise for stress reduction, and restorative sleep.   Lab Results  Component Value Date   HGBA1C 5.9 (H) 01/02/2021   Lab Results  Component Value Date   INSULIN 14.4 01/02/2021   2. Mixed hyperlipidemia Course: Uncontrolled.  Lipid-lowering medications: None.   Plan: Dietary changes: Increase soluble fiber, decrease simple carbohydrates, decrease saturated fat. Exercise changes: Moderate to vigorous-intensity aerobic activity 150 minutes per week or as tolerated. We will continue to monitor along with PCP/specialists as it pertains to her weight loss journey.  Lab Results  Component Value Date   CHOL 227 (H) 01/02/2021   HDL 50 01/02/2021   LDLCALC 134 (H) 01/02/2021   LDLDIRECT 111.0 10/18/2019   TRIG 243 (H) 01/02/2021   CHOLHDL 4.5 (H) 01/02/2021   Lab Results  Component Value Date   ALT 24 01/02/2021   AST 16 01/02/2021   ALKPHOS 89 01/02/2021   BILITOT 0.2  01/02/2021   The 10-year ASCVD risk score Crystal Bean DC Jr., et al., 2013) is: 1.2%   Values used to calculate the score:     Age: 45 years     Sex: Female     Is Non-Hispanic African American: No     Diabetic: No     Tobacco smoker: No     Systolic Blood Pressure: 244 mmHg     Is BP treated: Yes     HDL Cholesterol: 50 mg/dL     Total Cholesterol: 227 mg/dL  3. Bipolar 1 disorder ([name]), with emotional eating She is taking Effexor XR 300 mg daily, Abilify 10 mg daily, and Wellbutrin XL 300 mg daily.Behavior modification techniques were discussed today to helpBethdeal with heremotional/non-hunger eating Bean.  4. Obesity, current BMI 38.5  Course: Crystal Bean is currently in the action stage of change. As such, her goal is to continue with weight loss efforts.   Nutrition goals: She has agreed to the Category 2 Plan and keeping a food journal and adhering to recommended goals of 400-500 calories and 35+ grams of protein.   Exercise goals: As is.  Behavioral modification strategies: increasing lean protein intake, decreasing simple carbohydrates, increasing vegetables and increasing water intake.  Crystal Bean has agreed to follow-up with our clinic in 3 weeks. She was informed of the importance of frequent follow-up visits to maximize her success with intensive lifestyle modifications for her multiple health conditions.   Objective:   Blood pressure 111/74, pulse  70, temperature 98.4 F (36.9 C), temperature source Oral, height 5\' 6"  (1.676 m), weight 238 lb (108 kg), SpO2 95 %. Body mass index is 38.41 kg/m.  General: Cooperative, alert, well developed, in no acute distress. HEENT: Conjunctivae and lids unremarkable. Cardiovascular: Regular rhythm.  Lungs: Normal work of breathing. Neurologic: No focal deficits.   Lab Results  Component Value Date   CREATININE 1.03 (H) 01/02/2021   BUN 10 01/02/2021   NA 139 01/02/2021   K 5.0 01/02/2021   CL 95 (L) 01/02/2021   CO2 25  01/02/2021   Lab Results  Component Value Date   ALT 24 01/02/2021   AST 16 01/02/2021   ALKPHOS 89 01/02/2021   BILITOT 0.2 01/02/2021   Lab Results  Component Value Date   HGBA1C 5.9 (H) 01/02/2021   HGBA1C 5.7 (H) 09/20/2020   HGBA1C 6.0 10/18/2019   HGBA1C 6.2 08/02/2019   HGBA1C 5.9 04/08/2018   Lab Results  Component Value Date   INSULIN 14.4 01/02/2021   Lab Results  Component Value Date   TSH 2.410 01/02/2021   Lab Results  Component Value Date   CHOL 227 (H) 01/02/2021   HDL 50 01/02/2021   LDLCALC 134 (H) 01/02/2021   LDLDIRECT 111.0 10/18/2019   TRIG 243 (H) 01/02/2021   CHOLHDL 4.5 (H) 01/02/2021   Lab Results  Component Value Date   WBC 13.1 (H) 01/02/2021   HGB 13.5 01/02/2021   HCT 42.8 01/02/2021   MCV 88 01/02/2021   PLT 543 (H) 01/02/2021   Obesity Behavioral Intervention:   Approximately 15 minutes were spent on the discussion below.  ASK: We discussed the diagnosis of obesity with Crystal Bean today and Crystal Bean agreed to give [place] permission to discuss obesity behavioral modification therapy today.  ASSESS: Crystal Bean has the diagnosis of obesity and her BMI today is 38.5. Crystal Bean is in the action stage of change.   ADVISE: Crystal Bean was educated on the multiple health risks of obesity as well as the benefit of weight loss to improve her health. She was advised of the need for long term treatment and the importance of lifestyle modifications to improve her current health and to decrease her risk of future health problems.  AGREE: Multiple dietary modification options and treatment options were discussed and Crystal Bean agreed to follow the recommendations documented in the above note.  ARRANGE: Crystal Bean was educated on the importance of frequent visits to treat obesity as outlined per CMS and USPSTF guidelines and agreed to schedule her next follow up appointment today.  Attestation Statements:   Reviewed by clinician on day of visit: allergies, medications, problem list,  medical history, surgical history, family history, social history, and previous encounter notes.  I, Water quality scientist, CMA, am acting as transcriptionist for Briscoe Deutscher, DO  I have reviewed the above documentation for accuracy and completeness, and I agree with the above. Briscoe Deutscher, DO

## 2021-03-27 ENCOUNTER — Ambulatory Visit (INDEPENDENT_AMBULATORY_CARE_PROVIDER_SITE_OTHER): Payer: Medicare Other | Admitting: Family Medicine

## 2021-03-29 ENCOUNTER — Other Ambulatory Visit: Payer: Self-pay | Admitting: Physician Assistant

## 2021-03-29 ENCOUNTER — Other Ambulatory Visit: Payer: Self-pay

## 2021-03-29 ENCOUNTER — Ambulatory Visit: Payer: Medicare Other | Admitting: Physical Therapy

## 2021-03-29 DIAGNOSIS — M6281 Muscle weakness (generalized): Secondary | ICD-10-CM

## 2021-03-29 DIAGNOSIS — M545 Low back pain, unspecified: Secondary | ICD-10-CM | POA: Diagnosis not present

## 2021-03-29 DIAGNOSIS — R293 Abnormal posture: Secondary | ICD-10-CM

## 2021-03-29 DIAGNOSIS — G8929 Other chronic pain: Secondary | ICD-10-CM

## 2021-03-29 NOTE — Therapy (Signed)
[facility] [address] [place], [place], [address] Phone: [phone]   Fax:  [phone]  Physical Therapy Treatment  Patient Details  Name: Crystal Bean MRN: [id number] Date of Birth: 1976-05-17 Referring Provider (PT): Inda Coke, [place]  Progress Note Reporting Period 01/08/21 to 03/29/21  See note below for Objective Data and Assessment of Progress/Goals.     Encounter Date: 03/29/2021   PT End of Session - 03/29/21 1006    Visit Number 10    Date for PT Re-Evaluation 05/10/21    Authorization Type medicare A/B    PT Start Time 0930    PT Stop Time 1015    PT Time Calculation (min) 45 min    Activity Tolerance Patient tolerated treatment well           Past Medical History:  Diagnosis Date  . ADHD (attention deficit hyperactivity disorder)   . Anxiety   . B12 deficiency   . Back pain   . Bipolar 1 disorder ([place])   . Bulging lumbar disc   . C. difficile diarrhea 04/24/2016   treated with metronidazole  . Constipation   . Depression   . GERD (gastroesophageal reflux disease)   . History of chicken pox   . History of shingles 2013  . Hypertension   . IBS (irritable bowel syndrome)   . Joint pain   . Major depressive disorder   . Migraines   . Obesity   . Obsessive-compulsive disorder   . Osteoarthritis   . Prediabetes   . PTSD (post-traumatic stress disorder)   . Sleep apnea   . SOB (shortness of breath)     Past Surgical History:  Procedure Laterality Date  . BLADDER SURGERY  1981   Urethera stretched  . caps on teeth  1980   Caps on all Teeth  . CESAREAN SECTION  2003  . TONSILLECTOMY    . WISDOM TOOTH EXTRACTION      There were no vitals filed for this visit.   Subjective Assessment - 03/29/21 0932    Subjective Back is pretty good.  Yesterday was painful 5/10 yesterday with being on my feet a while.  Did fine walking in [place].  "I always feel better when I leave  here."    How long can you walk comfortably? it depends on the day, some days cannot go to the grocery store    Patient Stated Goals be able to do chores without pain and be able to walk 1 mile    Currently in Pain? Yes    Pain Score 1     Pain Location Back    Pain Type Chronic pain              [facility] PT Assessment - 03/29/21 0001      Observation/Other Assessments   Focus on Therapeutic Outcomes (FOTO)  46%      AROM   Overall AROM Comments flexion 50, ext 20, 30 bil sidebend      Strength   Overall Strength Comments left hip abd 4+/5, right 5/5; hip extension 4+/5 bil; abdominals 4/5      Flexibility   Hamstrings 85 bil                         [facility] Adult PT Treatment/Exercise - 03/29/21 0001      Therapeutic Activites    ADL's 5# single leg/golfers lift to touch 2nd step 8x right/left  Lumbar Exercises: Stretches   Other Lumbar Stretch Exercise 2nd step hip flexor with UE raise      Lumbar Exercises: Aerobic   Nustep L3 10 min while discussing progress      Lumbar Exercises: Standing   Other Standing Lumbar Exercises floor sliders 5x      Knee/Hip Exercises: Standing   SLS hip abduction ball on wall with small dips 10x right/left    Other Standing Knee Exercises sit to stand 5# 2 sets of 5; 2nd set press weight up    Other Standing Knee Exercises 2 5# dumbells 10x3                    PT Short Term Goals - 03/15/21 2006      PT SHORT TERM GOAL #1   Title pt will be able to walk around the grocery store without feeling like she has to leave due to pain    Status Achieved             PT Long Term Goals - 03/29/21 1019      PT LONG TERM GOAL #1   Title Pt will be ind with HEP for maintaining strength gains    Time 8    Period Weeks    Status On-going    Target Date 05/10/21      PT LONG TERM GOAL #2   Title Pt will be able to load and unloading the dishwasher without stopping due to pain    Status Achieved      PT  LONG TERM GOAL #3   Title Pt will be able to perform 10 squats with good mechanics and no breath holding for functional activities without pain    Status Achieved      PT LONG TERM GOAL #4   Title Pt will be able to do all household chores with 70% less pain due to improved core strength and stability    Time 8    Period Weeks    Status On-going      PT LONG TERM GOAL #5   Title Pt will have improved FOTO score of 52 based on suggested outcomes    Time 8    Period Weeks    Status On-going      PT LONG TERM GOAL #6   Title Able to walk 1 mile with her daughter in the neighborhood    Time 8    Period Weeks    Status On-going      PT LONG TERM GOAL #7   Title The patient will be do kitchen clean up/general cleaning for 30 minutes    Time 8    Period Weeks    Status On-going      PT LONG TERM GOAL #8   Title Six min walk test improved to 1250 feet    Time 8    Period Weeks    Status On-going                 Plan - 03/29/21 1008    Clinical Impression Statement The patient is progressing well with lumbo/pelvic/hip strengthening ex's.  She is able to perform a progression of intensity without complaints of increased back pain.  Therapist providing verbal cues for hip hinge technique and patellofemoral alignment.   On track to meet rehab goals.    Personal Factors and Comorbidities Time since onset of injury/illness/exacerbation;Comorbidity 1    Examination-Participation Restrictions Community Activity    Rehab Potential  Good    PT Frequency 1x / week    PT Duration 8 weeks    PT Treatment/Interventions ADLs/Self Care Home Management;Biofeedback;Cryotherapy;Therapeutic exercise;Neuromuscular re-education;Therapeutic activities;Patient/family education;Manual techniques;Dry needling;Passive range of motion;Taping    PT Next Visit Plan monitor walking program for goal of 1 mile;  functional strengthening in standing; hip hinge to dead lift progression;  lumbo/pelvic/hip  strengthening    PT Home Exercise Plan Access Code: [email or web address]           Patient will benefit from skilled therapeutic intervention in order to improve the following deficits and impairments:  Impaired flexibility,Decreased range of motion,Increased fascial restricitons,Postural dysfunction,Decreased endurance,Decreased strength  Visit Diagnosis: Chronic bilateral low back pain, unspecified whether sciatica present  Muscle weakness (generalized)  Abnormal posture     Problem List Patient Active Problem List   Diagnosis Date Noted  . Pre-diabetes 02/12/2021  . Erythema nodosum 12/26/2020  . Excessive daytime sleepiness 10/30/2020  . Snoring 10/30/2020  . Nocturia more than twice per night 10/30/2020  . ADHD, adult residual type 10/30/2020  . 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 Obsessive compulsive disorder 09/08/2018  . Bipolar 1 disorder ([place]) 08/26/2018  . Obesity 04/08/2018  . Essential hypertension 04/08/2018  . PTSD (post-traumatic stress disorder)   . Osteoarthritis   . Migraines   . GERD (gastroesophageal reflux disease)   . Major depressive disorder, recurrent episode with mixed features ([place])   . Bulging lumbar disc    Ruben Im, PT 03/29/21 10:31 AM Phone: [phone] Fax: [phone] Alvera Singh 03/29/2021, 10:20 AM  [facility] [address] [place], [place], [address] Phone: [phone]   Fax:  [phone]  Name: DEMARIE UHLIG MRN: [id number] Date of Birth: 1976-09-17

## 2021-04-05 ENCOUNTER — Other Ambulatory Visit: Payer: Self-pay

## 2021-04-05 ENCOUNTER — Ambulatory Visit: Payer: Medicare Other | Admitting: Physical Therapy

## 2021-04-05 DIAGNOSIS — M545 Low back pain, unspecified: Secondary | ICD-10-CM | POA: Diagnosis not present

## 2021-04-05 DIAGNOSIS — G8929 Other chronic pain: Secondary | ICD-10-CM

## 2021-04-05 DIAGNOSIS — M6281 Muscle weakness (generalized): Secondary | ICD-10-CM

## 2021-04-05 DIAGNOSIS — R293 Abnormal posture: Secondary | ICD-10-CM
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
## 2021-04-08 ENCOUNTER — Other Ambulatory Visit: Payer: Self-pay | Admitting: Physician Assistant

## 2021-04-12 ENCOUNTER — Other Ambulatory Visit: Payer: Self-pay

## 2021-04-12 ENCOUNTER — Ambulatory Visit: Payer: Medicare Other | Admitting: Physical Therapy

## 2021-04-12 DIAGNOSIS — M545 Low back pain, unspecified: Secondary | ICD-10-CM

## 2021-04-12 DIAGNOSIS — G8929 Other chronic pain: Secondary | ICD-10-CM

## 2021-04-12 DIAGNOSIS — R293 Abnormal posture: Secondary | ICD-10-CM

## 2021-04-12 DIAGNOSIS — M6281 Muscle weakness (generalized): Secondary | ICD-10-CM

## 2021-04-12 NOTE — Therapy (Signed)
[facility] [address], [place], [place], [address] Phone: [phone]   Fax:  [phone]  Physical Therapy Treatment  Patient Details  Name: Crystal Bean MRN: [id number] Date of Birth: 06-02-1976 Referring Provider (PT): Inda Coke, [place]   Encounter Date: 04/12/2021   PT End of Session - 04/12/21 1424    Visit Number 12    Date for PT Re-Evaluation 05/10/21    Authorization Type medicare A/B    PT Start Time 0845    PT Stop Time 0930    PT Time Calculation (min) 45 min    Activity Tolerance Patient tolerated treatment well           Past Medical History:  Diagnosis Date  . ADHD (attention deficit hyperactivity disorder)   . Anxiety   . B12 deficiency   . Back pain   . Bipolar 1 disorder ([place])   . Bulging lumbar disc   . C. difficile diarrhea 04/24/2016   treated with metronidazole  . Constipation   . Depression   . GERD (gastroesophageal reflux disease)   . History of chicken pox   . History of shingles 2013  . Hypertension   . IBS (irritable bowel syndrome)   . Joint pain   . Major depressive disorder   . Migraines   . Obesity   . Obsessive-compulsive disorder   . Osteoarthritis   . Prediabetes   . PTSD (post-traumatic stress disorder)   . Sleep apnea   . SOB (shortness of breath)     Past Surgical History:  Procedure Laterality Date  . BLADDER SURGERY  1981   Urethera stretched  . caps on teeth  1980   Caps on all Teeth  . CESAREAN SECTION  2003  . TONSILLECTOMY    . WISDOM TOOTH EXTRACTION      There were no vitals filed for this visit.   Subjective Assessment - 04/12/21 0847    Subjective Been cleaning the house.  I'm not sure what I did but my back is sore this morning.  That's normal for me.  Did fine with the "every minute on the minute" routine.    How long can you walk comfortably? it depends on the day, some days cannot go to the grocery store    Patient Stated  Goals be able to do chores without pain and be able to walk 1 mile    Currently in Pain? Yes    Pain Score 4     Pain Location Back    Pain Orientation Lower    Pain Type Chronic pain                             [facility] Adult PT Treatment/Exercise - 04/12/21 0001      Lumbar Exercises: Aerobic   Nustep L3 10 min while discussing progress      Lumbar Exercises: Standing   Functional Squats Limitations 20# retro lunge 5x    Other Standing Lumbar Exercises 1st round of EMOM: sit stand 10#; cable walk 25# 10x; farmer's carries 2 5# weights; counter push ups 20x; 5# snatch and press 5x each side    Other Standing Lumbar Exercises 20# lat pull downs20x      Knee/Hip Exercises: Standing   Walking with Sports Cord 20# cable side step 3x each side    Other Standing Knee Exercises 2nd round of 5 minutes EMOM: 10# kettlebell lifts; cable  walk, farmer's carries 2 5# weights; 30/30 step ups; 10x windmills    Other Standing Knee Exercises 3rd round of EMOM: bil lat pull downs 15x, cable side step 3x each side; back lunge holding cable handles 5x right/left                    PT Short Term Goals - 03/15/21 2006      PT SHORT TERM GOAL #1   Title pt will be able to walk around the grocery store without feeling like she has to leave due to pain    Status Achieved             PT Long Term Goals - 03/29/21 1019      PT LONG TERM GOAL #1   Title Pt will be ind with HEP for maintaining strength gains    Time 8    Period Weeks    Status On-going    Target Date 05/10/21      PT LONG TERM GOAL #2   Title Pt will be able to load and unloading the dishwasher without stopping due to pain    Status Achieved      PT LONG TERM GOAL #3   Title Pt will be able to perform 10 squats with good mechanics and no breath holding for functional activities without pain    Status Achieved      PT LONG TERM GOAL #4   Title Pt will be able to do all household chores with 70% less  pain due to improved core strength and stability    Time 8    Period Weeks    Status On-going      PT LONG TERM GOAL #5   Title Pt will have improved FOTO score of 52 based on suggested outcomes    Time 8    Period Weeks    Status On-going      PT LONG TERM GOAL #6   Title Able to walk 1 mile with her daughter in the neighborhood    Time 8    Period Weeks    Status On-going      PT LONG TERM GOAL #7   Title The patient will be do kitchen clean up/general cleaning for 30 minutes    Time 8    Period Weeks    Status On-going      PT LONG TERM GOAL #8   Title Six min walk test improved to 1250 feet    Time 8    Period Weeks    Status On-going                 Plan - 04/12/21 1425    Clinical Impression Statement The patient is able to continue with a progression of lumbo/pelvic/hip strengthening and intensity with rounds of "every minute on the minute".   Verbal cues for hip hinge with sit to stand but no cues needed for dead lifting.  She arrives with reports of back soreness which is not a limiting factor to exercise and no worse at the completion of session.  On track to meet rehab goals.    Personal Factors and Comorbidities Time since onset of injury/illness/exacerbation;Comorbidity 1    Rehab Potential Good    PT Frequency 1x / week    PT Duration 8 weeks    PT Treatment/Interventions ADLs/Self Care Home Management;Biofeedback;Cryotherapy;Therapeutic exercise;Neuromuscular re-education;Therapeutic activities;Patient/family education;Manual techniques;Dry needling;Passive range of motion;Taping    PT Next Visit Plan every minute  on the minute ex;  walking program;  lumbopelvic hip strengthening    PT Home Exercise Plan Access Code: [email or web address]           Patient will benefit from skilled therapeutic intervention in order to improve the following deficits and impairments:  Impaired flexibility,Decreased range of motion,Increased fascial restricitons,Postural  dysfunction,Decreased endurance,Decreased strength  Visit Diagnosis: Chronic bilateral low back pain, unspecified whether sciatica present  Muscle weakness (generalized)  Abnormal posture     Problem List Patient Active Problem List   Diagnosis Date Noted  . Pre-diabetes 02/12/2021  . Erythema nodosum 12/26/2020  . Excessive daytime sleepiness 10/30/2020  . Snoring 10/30/2020  . Nocturia more than twice per night 10/30/2020  . ADHD, adult residual type 10/30/2020  . 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 Obsessive compulsive disorder 09/08/2018  . Bipolar 1 disorder ([place]) 08/26/2018  . Obesity 04/08/2018  . Essential hypertension 04/08/2018  . PTSD (post-traumatic stress disorder)   . Osteoarthritis   . Migraines   . GERD (gastroesophageal reflux disease)   . Major depressive disorder, recurrent episode with mixed features ([place])   . Bulging lumbar disc    Ruben Im, PT 04/12/21 2:31 PM Phone: [phone] Fax: [phone] Alvera Singh 04/12/2021, 2:30 PM  [facility] [address], [place], [place], [address] Phone: [phone]   Fax:  [phone]  Name: Crystal Bean MRN: [id number] Date of Birth: [birth year]

## 2021-04-17 ENCOUNTER — Ambulatory Visit (INDEPENDENT_AMBULATORY_CARE_PROVIDER_SITE_OTHER): Payer: Medicare Other | Admitting: Family Medicine

## 2021-04-17 ENCOUNTER — Other Ambulatory Visit: Payer: Self-pay

## 2021-04-17 ENCOUNTER — Encounter (INDEPENDENT_AMBULATORY_CARE_PROVIDER_SITE_OTHER): Payer: Self-pay | Admitting: Family Medicine

## 2021-04-17 VITALS — BP 105/72 | HR 73 | Temp 98.7°F | Ht 66.0 in | Wt 236.0 lb

## 2021-04-17 DIAGNOSIS — E65 Localized adiposity: Secondary | ICD-10-CM | POA: Diagnosis not present

## 2021-04-17 DIAGNOSIS — Z6841 Body Mass Index (BMI) 40.0 and over, adult: Secondary | ICD-10-CM

## 2021-04-18 NOTE — Progress Notes (Signed)
Chief Complaint:   OBESITY Crystal Bean is here to discuss her progress with her obesity treatment plan along with follow-up of her obesity related diagnoses. Crystal Bean is on the Category 2 Plan and keeping a food journal and adhering to recommended goals of 400-500 calories and 35+ grams of protein at supper daily and states she is following her eating plan approximately 88-90% of the time. Crystal Bean states she is doing 0 minutes 0 times per week.  Today's visit was #: 7 Starting weight: 252 lbs Starting date: 01/02/2021 Today's weight: 236 lbs Today's date: 04/17/2021 Total lbs lost to date: 16 Total lbs lost since last in-office visit: 2  Interim History: Crystal Bean's hunger is well controlled. She is not skipping lunch. However, she drinks 3-4 glasses of 1/2 sweet tea per day. She is not using her snack calories. She has Cherrios and Fair life milk at breakfast as discussed with Dr. Juleen China. She is on the plan well at meals.   Subjective:   1. Visceral obesity Crystal Bean's starting visceral fat rating was 14. It is down to 12. Goal is < 12.  Assessment/Plan:   1. Visceral obesity Crystal Bean will continue her meal plan and will start exercise.  2. Obesity, current BMI 38.11 Crystal Bean is currently in the action stage of change. As such, her goal is to continue with weight loss efforts. She has agreed to the Category 2 Plan and keeping a food journal and adhering to recommended goals of 400-500 calories and 35+ grams of protein at supper daily.   Crystal Bean will work on cutting back to 2 glasses of 1/2 sweet tea per day. Smart Fruit handout was given today.  Exercise goals: Crystal Bean is to walk for 20 minutes 3 times per week.  Behavioral modification strategies: increasing lean protein intake and decreasing simple carbohydrates.  Crystal Bean has agreed to follow-up with our clinic in 3 weeks.   Objective:   Blood pressure 105/72, pulse 73, temperature 98.7 F (37.1 C), height 5\' 6"  (1.676 m), weight 236 lb (107 kg), SpO2 97  %. Body mass index is 38.09 kg/m.  General: Cooperative, alert, well developed, in no acute distress. HEENT: Conjunctivae and lids unremarkable. Cardiovascular: Regular rhythm.  Lungs: Normal work of breathing. Neurologic: No focal deficits.   Lab Results  Component Value Date   CREATININE 1.03 (H) 01/02/2021   BUN 10 01/02/2021   NA 139 01/02/2021   K 5.0 01/02/2021   CL 95 (L) 01/02/2021   CO2 25 01/02/2021   Lab Results  Component Value Date   ALT 24 01/02/2021   AST 16 01/02/2021   ALKPHOS 89 01/02/2021   BILITOT 0.2 01/02/2021   Lab Results  Component Value Date   HGBA1C 5.9 (H) 01/02/2021   HGBA1C 5.7 (H) 09/20/2020   HGBA1C 6.0 10/18/2019   HGBA1C 6.2 08/02/2019   HGBA1C 5.9 04/08/2018   Lab Results  Component Value Date   INSULIN 14.4 01/02/2021   Lab Results  Component Value Date   TSH 2.410 01/02/2021   Lab Results  Component Value Date   CHOL 227 (H) 01/02/2021   HDL 50 01/02/2021   LDLCALC 134 (H) 01/02/2021   LDLDIRECT 111.0 10/18/2019   TRIG 243 (H) 01/02/2021   CHOLHDL 4.5 (H) 01/02/2021   Lab Results  Component Value Date   WBC 13.1 (H) 01/02/2021   HGB 13.5 01/02/2021   HCT 42.8 01/02/2021   MCV 88 01/02/2021   PLT 543 (H) 01/02/2021   No results found for: IRON,  TIBC, FERRITIN  Attestation Statements:   Reviewed by clinician on day of visit: allergies, medications, problem list, medical history, surgical history, family history, social history, and previous encounter notes.   Wilhemena Durie, am acting as Location manager for [employer], FNP-C.  I have reviewed the above documentation for accuracy and completeness, and I agree with the above. -  Georgianne Fick, FNP

## 2021-04-19 ENCOUNTER — Other Ambulatory Visit: Payer: Self-pay | Admitting: Physician Assistant

## 2021-04-19 ENCOUNTER — Ambulatory Visit: Payer: Medicare Other | Attending: Physician Assistant | Admitting: Physical Therapy

## 2021-04-19 ENCOUNTER — Encounter (INDEPENDENT_AMBULATORY_CARE_PROVIDER_SITE_OTHER): Payer: Self-pay | Admitting: Family Medicine

## 2021-04-19 ENCOUNTER — Other Ambulatory Visit: Payer: Self-pay

## 2021-04-19 DIAGNOSIS — M545 Low back pain, unspecified: Secondary | ICD-10-CM | POA: Insufficient documentation

## 2021-04-19 DIAGNOSIS — M6281 Muscle weakness (generalized): Secondary | ICD-10-CM | POA: Diagnosis present

## 2021-04-19 DIAGNOSIS — G8929 Other chronic pain: Secondary | ICD-10-CM | POA: Insufficient documentation

## 2021-04-19 DIAGNOSIS — R293 Abnormal posture: Secondary | ICD-10-CM | POA: Insufficient documentation

## 2021-04-19 NOTE — Therapy (Signed)
[facility] [address], [place], [place], [address] Phone: [phone]   Fax:  [phone]  Physical Therapy Treatment  Patient Details  Name: Crystal Bean MRN: [id number] Date of Birth: [birth year] Referring Provider (PT): Inda Coke, [place]   Encounter Date: 04/19/2021   PT End of Session - 04/19/21 0841    Visit Number 13    Date for PT Re-Evaluation 05/10/21    Authorization Type medicare A/B    PT Start Time 0803    PT Stop Time 0844    PT Time Calculation (min) 41 min           Past Medical History:  Diagnosis Date  . ADHD (attention deficit hyperactivity disorder)   . Anxiety   . B12 deficiency   . Back pain   . Bipolar 1 disorder ([place])   . Bulging lumbar disc   . C. difficile diarrhea 04/24/2016   treated with metronidazole  . Constipation   . Depression   . GERD (gastroesophageal reflux disease)   . History of chicken pox   . History of shingles 2013  . Hypertension   . IBS (irritable bowel syndrome)   . Joint pain   . Major depressive disorder   . Migraines   . Obesity   . Obsessive-compulsive disorder   . Osteoarthritis   . Prediabetes   . PTSD (post-traumatic stress disorder)   . Sleep apnea   . SOB (shortness of breath)     Past Surgical History:  Procedure Laterality Date  . BLADDER SURGERY  1981   Urethera stretched  . caps on teeth  1980   Caps on all Teeth  . CESAREAN SECTION  2003  . TONSILLECTOMY    . WISDOM TOOTH EXTRACTION      There were no vitals filed for this visit.   Subjective Assessment - 04/19/21 0806    Subjective I vacumned this week and it didn't hurt!   Did dishes and 4 out of 5 times it was good.    Currently in Pain? No/denies    Pain Score 0-No pain    Pain Location Back    Pain Orientation Lower                             [facility] Adult PT Treatment/Exercise - 04/19/21 0001      Ambulation/Gait   Gait Comments 3 min walk  holding 2 8# weights      Lumbar Exercises: Aerobic   Nustep L3 10 min while discussing progress      Lumbar Exercises: Standing   Other Standing Lumbar Exercises 1st round of every minute on the minute: lat pull downs 25# 15, resisted walk 30#, backward lunge with UE assist on 65# hands on cable column; 10# dead lifts 15x      Knee/Hip Exercises: Standing   Other Standing Knee Exercises 2nd round of EMOM: 8# step ups; windmills 1 min; 8# 2 farmers carries 1 min; counter push ups 15; lat bar 25# 15x                    PT Short Term Goals - 03/15/21 2006      PT SHORT TERM GOAL #1   Title pt will be able to walk around the grocery store without feeling like she has to leave due to pain    Status Achieved  PT Long Term Goals - 03/29/21 1019      PT LONG TERM GOAL #1   Title Pt will be ind with HEP for maintaining strength gains    Time 8    Period Weeks    Status On-going    Target Date 05/10/21      PT LONG TERM GOAL #2   Title Pt will be able to load and unloading the dishwasher without stopping due to pain    Status Achieved      PT LONG TERM GOAL #3   Title Pt will be able to perform 10 squats with good mechanics and no breath holding for functional activities without pain    Status Achieved      PT LONG TERM GOAL #4   Title Pt will be able to do all household chores with 70% less pain due to improved core strength and stability    Time 8    Period Weeks    Status On-going      PT LONG TERM GOAL #5   Title Pt will have improved FOTO score of 52 based on suggested outcomes    Time 8    Period Weeks    Status On-going      PT LONG TERM GOAL #6   Title Able to walk 1 mile with her daughter in the neighborhood    Time 8    Period Weeks    Status On-going      PT LONG TERM GOAL #7   Title The patient will be do kitchen clean up/general cleaning for 30 minutes    Time 8    Period Weeks    Status On-going      PT LONG TERM GOAL #8    Title Six min walk test improved to 1250 feet    Time 8    Period Weeks    Status On-going                 Plan - 04/19/21 0956    Clinical Impression Statement The patient reports functional improvements with vacumning and washing dishes with less pain occurrence.  Good response to lumbo/pelvic/hip strengthening using "every minute on the minute" method with 2-3 rounds of 5 minutes each.  Verbal and tactile cues for activation of lat muscles.  Excellent technique with full dead lifts to the floor with 10#  kettlebell.  No complaint of back pain during treatment session.    Personal Factors and Comorbidities Time since onset of injury/illness/exacerbation;Comorbidity 1    Examination-Activity Limitations Bend;Lift    Rehab Potential Good    PT Frequency 1x / week    PT Duration 8 weeks    PT Treatment/Interventions ADLs/Self Care Home Management;Biofeedback;Cryotherapy;Therapeutic exercise;Neuromuscular re-education;Therapeutic activities;Patient/family education;Manual techniques;Dry needling;Passive range of motion;Taping    PT Next Visit Plan every minute on the minute ex;  walking program to meet LTG of 1 mile and 6 min walk test goals;  lumbopelvic hip strengthening    PT Home Exercise Plan Access Code: [email or web address]           Patient will benefit from skilled therapeutic intervention in order to improve the following deficits and impairments:  Impaired flexibility,Decreased range of motion,Increased fascial restricitons,Postural dysfunction,Decreased endurance,Decreased strength  Visit Diagnosis: Chronic bilateral low back pain, unspecified whether sciatica present  Muscle weakness (generalized)  Abnormal posture     Problem List Patient Active Problem List   Diagnosis Date Noted  . Pre-diabetes 02/12/2021  . Erythema nodosum 12/26/2020  .  Excessive daytime sleepiness 10/30/2020  . Snoring 10/30/2020  . Nocturia more than twice per night 10/30/2020  . ADHD, adult  residual type 10/30/2020  . 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 Obsessive compulsive disorder 09/08/2018  . Bipolar 1 disorder ([place]) 08/26/2018  . Obesity 04/08/2018  . Essential hypertension 04/08/2018  . PTSD (post-traumatic stress disorder)   . Osteoarthritis   . Migraines   . GERD (gastroesophageal reflux disease)   . Major depressive disorder, recurrent episode with mixed features ([place])   . Bulging lumbar disc    Ruben Im, PT 04/19/21 10:03 AM Phone: [phone] Fax: [phone] Alvera Singh 04/19/2021, 10:02 AM  [facility] [address], [place], [place], [address] Phone: [phone]   Fax:  [phone]  Name: Crystal Bean MRN: [id number] Date of Birth: [birth year]

## 2021-04-26 ENCOUNTER — Other Ambulatory Visit: Payer: Self-pay

## 2021-04-26 ENCOUNTER — Ambulatory Visit: Payer: Medicare Other | Admitting: Physical Therapy

## 2021-04-26 DIAGNOSIS — G8929 Other chronic pain: Secondary | ICD-10-CM

## 2021-04-26 DIAGNOSIS — M545 Low back pain, unspecified: Secondary | ICD-10-CM

## 2021-04-26 DIAGNOSIS — M6281 Muscle weakness (generalized): Secondary | ICD-10-CM

## 2021-04-26 DIAGNOSIS — R293 Abnormal posture: Secondary | ICD-10-CM

## 2021-04-26 NOTE — Therapy (Signed)
[facility] [address], [place], [place], [address] Phone: [phone]   Fax:  [phone]  Physical Therapy Treatment  Patient Details  Name: Crystal Bean MRN: [id number] Date of Birth: 07-30-1976 Referring Provider (PT): [place], [place], [place]   Encounter Date: 04/26/2021   PT End of Session - 04/26/21 0927     Visit Number 14    Date for PT Re-Evaluation 05/10/21    Authorization Type medicare A/B    PT Start Time 0847    PT Stop Time 0928    PT Time Calculation (min) 41 min    Activity Tolerance Patient tolerated treatment well    Behavior During Therapy [facility] for tasks assessed/performed             Past Medical History:  Diagnosis Date   ADHD (attention deficit hyperactivity disorder)    Anxiety    B12 deficiency    Back pain    Bipolar 1 disorder ([place])    Bulging lumbar disc    C. difficile diarrhea 04/24/2016   treated with metronidazole   Constipation    Depression    GERD (gastroesophageal reflux disease)    History of chicken pox    History of shingles 2013   Hypertension    IBS (irritable bowel syndrome)    Joint pain    Major depressive disorder    Migraines    Obesity    Obsessive-compulsive disorder    Osteoarthritis    Prediabetes    PTSD (post-traumatic stress disorder)    Sleep apnea    SOB (shortness of breath)     Past Surgical History:  Procedure Laterality Date   BLADDER SURGERY  1981   Urethera stretched   caps on teeth  1980   Caps on all Teeth   CESAREAN SECTION  2003   TONSILLECTOMY     WISDOM TOOTH EXTRACTION      There were no vitals filed for this visit.   Subjective Assessment - 04/26/21 0855     Subjective I mowed the yard sitting on the mower and it did start hurting my back.  Overall, still working on walking 1 mile and chores are getting better.    Currently in Pain? No/denies                               [facility] Adult PT  Treatment/Exercise - 04/26/21 0001       Lumbar Exercises: Stretches   Active Hamstring Stretch Right;Left;3 reps;20 seconds    Gastroc Stretch 30 seconds    Other Lumbar Stretch Exercise piriformis stretch 30 sec each      Lumbar Exercises: Aerobic   Elliptical L1 x 4 min while discussing status      Lumbar Exercises: Standing   Other Standing Lumbar Exercises Round 1 - 1 min x3sets: step up 8# 30/30, pin wheel 30/30, 8# walking, counter push up 15; Lat bar 15x 30#    Other Standing Lumbar Exercises Round 2 10-15 reps x 2 sets each; back ward lunge 15 reps; dead lift 10# x 15 reps; mini step down 10 reps (only one set)                      PT Short Term Goals - 03/15/21 2006       PT SHORT TERM GOAL #1   Title pt will be able to walk around the  grocery store without feeling like she has to leave due to pain    Status Achieved               PT Long Term Goals - 03/29/21 1019       PT LONG TERM GOAL #1   Title Pt will be ind with HEP for maintaining strength gains    Time 8    Period Weeks    Status On-going    Target Date 05/10/21      PT LONG TERM GOAL #2   Title Pt will be able to load and unloading the dishwasher without stopping due to pain    Status Achieved      PT LONG TERM GOAL #3   Title Pt will be able to perform 10 squats with good mechanics and no breath holding for functional activities without pain    Status Achieved      PT LONG TERM GOAL #4   Title Pt will be able to do all household chores with 70% less pain due to improved core strength and stability    Time 8    Period Weeks    Status On-going      PT LONG TERM GOAL #5   Title Pt will have improved FOTO score of 52 based on suggested outcomes    Time 8    Period Weeks    Status On-going      PT LONG TERM GOAL #6   Title Able to walk 1 mile with her daughter in the neighborhood    Time 8    Period Weeks    Status On-going      PT LONG TERM GOAL #7   Title The patient  will be do kitchen clean up/general cleaning for 30 minutes    Time 8    Period Weeks    Status On-going      PT LONG TERM GOAL #8   Title Six min walk test improved to 1250 feet    Time 8    Period Weeks    Status On-going                   Plan - 04/26/21 0929     Clinical Impression Statement Pt did well with exercises and able to add some resistance.  Pt demonstrates good techniques with one cue to not rotation when putting down or picking up weights.  pt fatigues more quickly in Lt gluteals and was definitly fatigued at the end of the session.  No increased pain only muscle fatigue.  pt will continue to benefit from skilled PT to build strength and become independent with HEP.    PT Treatment/Interventions ADLs/Self Care Home Management;Biofeedback;Cryotherapy;Therapeutic exercise;Neuromuscular re-education;Therapeutic activities;Patient/family education;Manual techniques;Dry needling;Passive range of motion;Taping    PT Next Visit Plan every minute on the minute ex;  walking program to meet LTG of 1 mile and 6 min walk test goals;  lumbopelvic hip strengthening    PT Home Exercise Plan Access Code: [email or web address]    Consulted and Agree with Plan of Care Patient             Patient will benefit from skilled therapeutic intervention in order to improve the following deficits and impairments:  Impaired flexibility, Decreased range of motion, Increased fascial restricitons, Postural dysfunction, Decreased endurance, Decreased strength  Visit Diagnosis: Chronic bilateral low back pain, unspecified whether sciatica present  Muscle weakness (generalized)  Abnormal posture     Problem List Patient Active  Problem List   Diagnosis Date Noted   Pre-diabetes 02/12/2021   Erythema nodosum 12/26/2020   Excessive daytime sleepiness 10/30/2020   Snoring 10/30/2020   Nocturia more than twice per night 10/30/2020   ADHD, adult residual type 10/30/2020   Retrognathia 10/30/2020    Obsessive compulsive disorder 09/08/2018   Bipolar 1 disorder ([place]) 08/26/2018   Obesity 04/08/2018   Essential hypertension 04/08/2018   PTSD (post-traumatic stress disorder)    Osteoarthritis    Migraines    GERD (gastroesophageal reflux disease)    Major depressive disorder, recurrent episode with mixed features ([facility])    Bulging lumbar disc     Jule Ser, PT 04/26/2021, 9:32 AM  [facility] [address], [place], [place], [address] Phone: [phone]   Fax:  [phone]  Name: Crystal Bean MRN: [id number] Date of Birth: 1976/05/19

## 2021-05-03 ENCOUNTER — Other Ambulatory Visit: Payer: Self-pay

## 2021-05-03 ENCOUNTER — Ambulatory Visit: Payer: Medicare Other | Admitting: Physical Therapy

## 2021-05-03 DIAGNOSIS — M545 Low back pain, unspecified: Secondary | ICD-10-CM | POA: Diagnosis not present

## 2021-05-03 DIAGNOSIS — R293 Abnormal posture: Secondary | ICD-10-CM

## 2021-05-03 DIAGNOSIS — M6281 Muscle weakness (generalized): Secondary | ICD-10-CM

## 2021-05-03 DIAGNOSIS — G8929 Other chronic pain: Secondary | ICD-10-CM

## 2021-05-03 NOTE — Therapy (Signed)
[facility] [address], [place], [place], [address] Phone: [phone]   Fax:  [phone]  Physical Therapy Treatment  Patient Details  Name: Crystal Bean MRN: [id number] Date of Birth: 1976-07-04 Referring Provider (PT): [place], [place], [place]   Encounter Date: 05/03/2021   PT End of Session - 05/03/21 0819     Visit Number 15    Date for PT Re-Evaluation 05/10/21    Authorization Type medicare A/B    PT Start Time 0802    PT Stop Time 4536    PT Time Calculation (min) 41 min    Activity Tolerance Patient tolerated treatment well             Past Medical History:  Diagnosis Date   ADHD (attention deficit hyperactivity disorder)    Anxiety    B12 deficiency    Back pain    Bipolar 1 disorder ([place])    Bulging lumbar disc    C. difficile diarrhea 04/24/2016   treated with metronidazole   Constipation    Depression    GERD (gastroesophageal reflux disease)    History of chicken pox    History of shingles 2013   Hypertension    IBS (irritable bowel syndrome)    Joint pain    Major depressive disorder    Migraines    Obesity    Obsessive-compulsive disorder    Osteoarthritis    Prediabetes    PTSD (post-traumatic stress disorder)    Sleep apnea    SOB (shortness of breath)     Past Surgical History:  Procedure Laterality Date   BLADDER SURGERY  1981   Urethera stretched   caps on teeth  1980   Caps on all Teeth   CESAREAN SECTION  2003   TONSILLECTOMY     WISDOM TOOTH EXTRACTION      There were no vitals filed for this visit.   Subjective Assessment - 05/03/21 0805     Subjective Feeling OK.  Did good on the ride to and from the beach.  It was easier walking at the beach.    Patient Stated Goals be able to do chores without pain and be able to walk 1 mile    Currently in Pain? No/denies    Pain Score 0-No pain    Pain Location Back    Pain Type Chronic pain                 [facility] PT Assessment - 05/03/21 0001       6 Minute Walk- Baseline   Perceived Rate of Exertion ([place]) --   3/20     6 minute walk test results    Endurance additional comments 1200                           [facility] Adult PT Treatment/Exercise - 05/03/21 0001       Ambulation/Gait   Gait Comments 6 min walk test      Lumbar Exercises: Aerobic   Elliptical L1 x 3 min    Nustep L3 10 min while discussing progress      Lumbar Exercises: Standing   Other Standing Lumbar Exercises Round 1 - 1 mi: step up 8# 30/30, pin wheel 30/30 8#, counter push up 15; Lat bar 15x 25#; 15# dead lifts 15x    Other Standing Lumbar Exercises backward lunge with UE assist 5x right /left  Knee/Hip Exercises: Standing   Other Standing Knee Exercises 30# resisted forward walkouts 8x                      PT Short Term Goals - 03/15/21 2006       PT SHORT TERM GOAL #1   Title pt will be able to walk around the grocery store without feeling like she has to leave due to pain    Status Achieved               PT Long Term Goals - 05/03/21 1311       PT LONG TERM GOAL #1   Title Pt will be ind with HEP for maintaining strength gains    Time 8    Period Weeks    Status On-going      PT LONG TERM GOAL #2   Title Pt will be able to load and unloading the dishwasher without stopping due to pain    Status Achieved      PT LONG TERM GOAL #3   Title Pt will be able to perform 10 squats with good mechanics and no breath holding for functional activities without pain    Status Achieved      PT LONG TERM GOAL #4   Title Pt will be able to do all household chores with 70% less pain due to improved core strength and stability    Time 8    Period Weeks    Status On-going      PT LONG TERM GOAL #5   Title Pt will have improved FOTO score of 52 based on suggested outcomes    Time 8    Period Weeks    Status On-going      PT LONG TERM GOAL #6   Title Able  to walk 1 mile with her daughter in the neighborhood    Time 8    Period Weeks    Status On-going      PT LONG TERM GOAL #7   Title The patient will be do kitchen clean up/general cleaning for 30 minutes    Time 8    Period Weeks    Status On-going      PT LONG TERM GOAL #8   Title Six min walk test improved to 1250 feet    Time 8    Period Weeks    Status Partially Met                   Plan - 05/03/21 0843     Clinical Impression Statement The patient has made a significant improvement in the Six minute walk test from 1,080 to 1,200 today with low perceived exertion level.  She is able to perform a progression of exercise challenge with longer duration aerobic exercise as well as  trunk and LE strengthening without pain exacerbation.  Therapist monitoring response will all.  Fewer verbal cues needed for technique indicating greater independence with her ex program.  On track with to meet rehab goals.    Personal Factors and Comorbidities Time since onset of injury/illness/exacerbation;Comorbidity 1    Examination-Activity Limitations Bend;Lift    Rehab Potential Good    PT Frequency 1x / week    PT Duration 8 weeks    PT Treatment/Interventions ADLs/Self Care Home Management;Biofeedback;Cryotherapy;Therapeutic exercise;Neuromuscular re-education;Therapeutic activities;Patient/family education;Manual techniques;Dry needling;Passive range of motion;Taping    PT Next Visit Plan check remaining goals to determine readiness for discharge;  every minute  on the minute ex;  walking program to meet LTG of 1 mile;  lumbopelvic hip strengthening    PT Home Exercise Plan Access Code: [email or web address]             Patient will benefit from skilled therapeutic intervention in order to improve the following deficits and impairments:  Impaired flexibility, Decreased range of motion, Increased fascial restricitons, Postural dysfunction, Decreased endurance, Decreased strength  Visit  Diagnosis: Chronic bilateral low back pain, unspecified whether sciatica present  Muscle weakness (generalized)  Abnormal posture     Problem List Patient Active Problem List   Diagnosis Date Noted   Pre-diabetes 02/12/2021   Erythema nodosum 12/26/2020   Excessive daytime sleepiness 10/30/2020   Snoring 10/30/2020   Nocturia more than twice per night 10/30/2020   ADHD, adult residual type 10/30/2020   Retrognathia 10/30/2020   Obsessive compulsive disorder 09/08/2018   Bipolar 1 disorder ([place]) 08/26/2018   Obesity 04/08/2018   Essential hypertension 04/08/2018   PTSD (post-traumatic stress disorder)    Osteoarthritis    Migraines    GERD (gastroesophageal reflux disease)    Major depressive disorder, 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 [place], [place], [address] Phone: [phone]   Fax:  [phone]  Name: MADIGAN ROSENSTEEL MRN: [id number] Date of Birth: [birth year]

## 2021-05-07 ENCOUNTER — Ambulatory Visit (INDEPENDENT_AMBULATORY_CARE_PROVIDER_SITE_OTHER): Payer: Medicare Other | Admitting: Family Medicine

## 2021-05-07 ENCOUNTER — Encounter (INDEPENDENT_AMBULATORY_CARE_PROVIDER_SITE_OTHER): Payer: Self-pay | Admitting: Family Medicine

## 2021-05-07 ENCOUNTER — Other Ambulatory Visit: Payer: Self-pay

## 2021-05-07 VITALS — BP 99/67 | HR 67 | Temp 98.0°F | Ht 66.0 in | Wt 233.0 lb

## 2021-05-07 DIAGNOSIS — I10 Essential (primary) hypertension: Secondary | ICD-10-CM | POA: Diagnosis not present

## 2021-05-07 DIAGNOSIS — E782 Mixed hyperlipidemia: Secondary | ICD-10-CM | POA: Diagnosis not present

## 2021-05-07 DIAGNOSIS — Z9189 Other specified personal risk factors, not elsewhere classified: Secondary | ICD-10-CM | POA: Diagnosis not present

## 2021-05-07 DIAGNOSIS — Z6841 Body Mass Index (BMI) 40.0 and over, adult: Secondary | ICD-10-CM

## 2021-05-07 DIAGNOSIS — R7303 Prediabetes: Secondary | ICD-10-CM

## 2021-05-07 DIAGNOSIS — E66813 Obesity, class 3: Secondary | ICD-10-CM

## 2021-05-07 NOTE — Progress Notes (Signed)
Chief Complaint:   OBESITY Crystal Bean is here to discuss her progress with her obesity treatment plan along with follow-up of her obesity related diagnoses. See Medical Weight Management Flowsheet for bioelectrical impedance results.  Today's visit was #: 8 Starting weight: 252 lbs Starting date: 01/02/2021 Today's weight: 233 lbs Today's date: 05/07/2021 Weight change since last visit: 3 lbs Total lbs lost to date: 19 lbs Body mass index is 37.61 kg/m.  Total weight loss percentage to date: -7.54%  Interim History:  Crystal Bean says she is drinking more soda and still having 1/2 sweetened ice tea daily.  She is also having a small bowl of ice cream.  She has been going to PT for chronic low back pain.  She is still not snacking. Patient's goal:  DC metformin in the future.  Nutrition Plan: the Category 2 Plan and keeping a food journal and adhering to recommended goals of 400-500 calories and 35+ grams of protein at supper for 90% of the time. Activity:  None.  Assessment/Plan:   1. Essential hypertension Low today. Medications: losartan 50 mg daily.   Plan:  Decrease losartan to 1/2 tablet.  Avoid buying foods that are: processed, frozen, or prepackaged to avoid excess salt. We will watch for signs of hypotension as she continues lifestyle modifications.  BP Readings from Last 3 Encounters:  05/07/21 99/67  04/17/21 105/72  03/26/21 111/74   Lab Results  Component Value Date   CREATININE 1.03 (H) 01/02/2021   2. Mixed hyperlipidemia Course: Not at goal. Lipid-lowering medications: None.   Plan: Dietary changes: Increase soluble fiber, decrease simple carbohydrates, decrease saturated fat. Exercise changes: Moderate to vigorous-intensity aerobic activity 150 minutes per week or as tolerated. We will continue to monitor along with PCP/specialists as it pertains to her weight loss journey.  Lab Results  Component Value Date   CHOL 227 (H) 01/02/2021   HDL 50 01/02/2021   LDLCALC  134 (H) 01/02/2021   LDLDIRECT 111.0 10/18/2019   TRIG 243 (H) 01/02/2021   CHOLHDL 4.5 (H) 01/02/2021   Lab Results  Component Value Date   ALT 24 01/02/2021   AST 16 01/02/2021   ALKPHOS 89 01/02/2021   BILITOT 0.2 01/02/2021   The 10-year ASCVD risk score Crystal Bussing DC Jr., et al., 2013) is: 0.9%   Values used to calculate the score:     Age: 45 years     Sex: Female     Is Non-Hispanic African American: No     Diabetic: No     Tobacco smoker: No     Systolic Blood Pressure: 99 mmHg     Is BP treated: Yes     HDL Cholesterol: 50 mg/dL     Total Cholesterol: 227 mg/dL  3. Pre-diabetes Not at goal. Goal is HgbA1c < 5.7.  Medication: metformin 1,000 mg daily.    Plan:  She will continue to focus on protein-rich, low simple carbohydrate foods. We reviewed the importance of hydration, regular exercise for stress reduction, and restorative sleep.   Lab Results  Component Value Date   HGBA1C 5.9 (H) 01/02/2021   Lab Results  Component Value Date   INSULIN 14.4 01/02/2021   4. Obesity, current BMI 37.6  Course: Crystal Bean is currently in the action stage of change. As such, her goal is to continue with weight loss efforts.   Nutrition goals: She has agreed to the Category 2 Plan and keeping a food journal and adhering to recommended goals of 400-500 calories  and 35+ grams of protein.   Exercise goals:  Walk 20 minutes 3 times per week.  Behavioral modification strategies: increasing lean protein intake, increasing water intake, and decreasing liquid calories.  Crystal Bean has agreed to follow-up with our clinic in 3 weeks, fasting for labs. She was informed of the importance of frequent follow-up visits to maximize her success with intensive lifestyle modifications for her multiple health conditions.   Objective:   Blood pressure 99/67, pulse 67, temperature 98 F (36.7 C), temperature source Oral, height 5\' 6"  (1.676 m), weight 233 lb (105.7 kg), SpO2 95 %. Body mass index is 37.61  kg/m.  General: Cooperative, alert, well developed, in no acute distress. HEENT: Conjunctivae and lids unremarkable. Cardiovascular: Regular rhythm.  Lungs: Normal work of breathing. Neurologic: No focal deficits.   Lab Results  Component Value Date   CREATININE 1.03 (H) 01/02/2021   BUN 10 01/02/2021   NA 139 01/02/2021   K 5.0 01/02/2021   CL 95 (L) 01/02/2021   CO2 25 01/02/2021   Lab Results  Component Value Date   ALT 24 01/02/2021   AST 16 01/02/2021   ALKPHOS 89 01/02/2021   BILITOT 0.2 01/02/2021   Lab Results  Component Value Date   HGBA1C 5.9 (H) 01/02/2021   HGBA1C 5.7 (H) 09/20/2020   HGBA1C 6.0 10/18/2019   HGBA1C 6.2 08/02/2019   HGBA1C 5.9 04/08/2018   Lab Results  Component Value Date   INSULIN 14.4 01/02/2021   Lab Results  Component Value Date   TSH 2.410 01/02/2021   Lab Results  Component Value Date   CHOL 227 (H) 01/02/2021   HDL 50 01/02/2021   LDLCALC 134 (H) 01/02/2021   LDLDIRECT 111.0 10/18/2019   TRIG 243 (H) 01/02/2021   CHOLHDL 4.5 (H) 01/02/2021   Lab Results  Component Value Date   WBC 13.1 (H) 01/02/2021   HGB 13.5 01/02/2021   HCT 42.8 01/02/2021   MCV 88 01/02/2021   PLT 543 (H) 01/02/2021   No results found for: IRON, TIBC, FERRITIN  Obesity Behavioral Intervention:   Approximately 15 minutes were spent on the discussion below.  ASK: We discussed the diagnosis of obesity with Crystal Bean today and Crystal Bean agreed to give [place] permission to discuss obesity behavioral modification therapy today.  ASSESS: Crystal Bean has the diagnosis of obesity and her BMI today is 37.61. Crystal Bean is in the action stage of change.   ADVISE: Crystal Bean was educated on the multiple health risks of obesity as well as the benefit of weight loss to improve her health. She was advised of the need for long term treatment and the importance of lifestyle modifications to improve her current health and to decrease her risk of future health problems.  AGREE: Multiple  dietary modification options and treatment options were discussed and Crystal Bean agreed to follow the recommendations documented in the above note.  ARRANGE: Crystal Bean was educated on the importance of frequent visits to treat obesity as outlined per CMS and USPSTF guidelines and agreed to schedule her next follow up appointment today.  Attestation Statements:   Reviewed by clinician on day of visit: allergies, medications, problem list, medical history, surgical history, family history, social history, and previous encounter notes.  I have reviewed the above documentation for accuracy and completeness, and I agree with the above. Briscoe Deutscher, DO

## 2021-05-10 ENCOUNTER — Ambulatory Visit: Payer: Medicare Other | Admitting: Physical Therapy

## 2021-05-10 ENCOUNTER — Other Ambulatory Visit: Payer: Self-pay

## 2021-05-10 DIAGNOSIS — M545 Low back pain, unspecified: Secondary | ICD-10-CM | POA: Diagnosis not present

## 2021-05-10 DIAGNOSIS — G8929 Other chronic pain: Secondary | ICD-10-CM

## 2021-05-10 DIAGNOSIS — R293 Abnormal posture: Secondary | ICD-10-CM

## 2021-05-10 DIAGNOSIS — M6281 Muscle weakness (generalized): Secondary | ICD-10-CM

## 2021-05-10 NOTE — Therapy (Signed)
[facility] [address], [place], [place], [address] Phone: [phone]   Fax:  [phone]  Physical Therapy Treatment/Discharge Summary   Patient Details  Name: Crystal Bean MRN: [id number] Date of Birth: 1976/08/22 Referring Provider (PT): Inda Coke, [place]   Encounter Date: 05/10/2021   PT End of Session - 05/10/21 2326     Visit Number 16    Date for PT Re-Evaluation 05/10/21    Authorization Type medicare A/B    PT Start Time 0800    PT Stop Time 9563    PT Time Calculation (min) 43 min    Activity Tolerance Patient tolerated treatment well             Past Medical History:  Diagnosis Date   ADHD (attention deficit hyperactivity disorder)    Anxiety    B12 deficiency    Back pain    Bipolar 1 disorder ([place])    Bulging lumbar disc    C. difficile diarrhea 04/24/2016   treated with metronidazole   Constipation    Depression    GERD (gastroesophageal reflux disease)    History of chicken pox    History of shingles 2013   Hypertension    IBS (irritable bowel syndrome)    Joint pain    Major depressive disorder    Migraines    Obesity    Obsessive-compulsive disorder    Osteoarthritis    Prediabetes    PTSD (post-traumatic stress disorder)    Sleep apnea    SOB (shortness of breath)     Past Surgical History:  Procedure Laterality Date   BLADDER SURGERY  1981   Urethera stretched   caps on teeth  1980   Caps on all Teeth   CESAREAN SECTION  2003   TONSILLECTOMY     WISDOM TOOTH EXTRACTION      There were no vitals filed for this visit.   Subjective Assessment - 05/10/21 0759     Subjective I'm checking into getting a pool membership.  I'm thinking about [employer].  No pain today, just tired.  I've lost 49# total.  Walking in the heat and at the beach is much easier.  I park far away at the store to get my walking in.    How long can you walk comfortably? it depends on the  day, some days cannot go to the grocery store    Patient Stated Goals be able to do chores without pain and be able to walk 1 mile    Currently in Pain? No/denies    Pain Score 0-No pain    Pain Location Back    Pain Type Chronic pain                [facility] PT Assessment - 05/10/21 0001       Observation/Other Assessments   Focus on Therapeutic Outcomes (FOTO)  55%      AROM   Overall AROM Comments lumbar ROM WFLs      Strength   Overall Strength Comments left hip abd 4+/5, right 5/5; hip extension 4+/5 bil; abdominals 4/5                           [facility] Adult PT Treatment/Exercise - 05/10/21 0001       Lumbar Exercises: Aerobic   Nustep L3 10 min while discussing progress      Lumbar Exercises: Standing   Other Standing  Lumbar Exercises every minute on the minute: box lift 10# 10x, windmills 5#; step ups 5#; counter push ups 15x; red band horizontal rows 20x    Other Standing Lumbar Exercises comprehensive review of HEP and discussion of gyms (looking for the right fit) and areas of walking on flat paved areas                      PT Short Term Goals - 03/15/21 2006       PT SHORT TERM GOAL #1   Title pt will be able to walk around the grocery store without feeling like she has to leave due to pain    Status Achieved               PT Long Term Goals - 05/10/21 0817       PT LONG TERM GOAL #1   Title Pt will be ind with HEP for maintaining strength gains    Status Achieved      PT LONG TERM GOAL #2   Title Pt will be able to load and unloading the dishwasher without stopping due to pain    Status Achieved      PT LONG TERM GOAL #3   Title Pt will be able to perform 10 squats with good mechanics and no breath holding for functional activities without pain    Status Achieved      PT LONG TERM GOAL #4   Title Pt will be able to do all household chores with 70% less pain due to improved core strength and stability     Baseline 50%      PT LONG TERM GOAL #5   Title Pt will have improved FOTO score of 52 based on suggested outcomes    Status Achieved      PT LONG TERM GOAL #6   Title Able to walk 1 mile with her daughter in the neighborhood    Baseline with somebody    Time 8    Period Weeks    Status Partially Met      PT LONG TERM GOAL #7   Title The patient will be do kitchen clean up/general cleaning for 30 minutes    Baseline 20 minutes    Status Partially Met      PT LONG TERM GOAL #8   Title Six min walk test improved to 1250 feet    Time 8    Period Weeks    Status Partially Met                   Plan - 05/10/21 2326     Clinical Impression Statement The patient has progressed very well with symptom reduction, increased function (particularly with tolerance for household chores) and walking endurance.  Her lumbar ROM is WFLs and lumbo/pelvic/hip strength is at least 4+/5.  She is able to complete the 6 min walk test without stopping 1200 feet.  FOTO functional outcome score with steady gains and improved to 55%.  We have discussed HEP progression, walking program and finding the right fit at the gym.  Recommend discharge from PT at this time wiht majority of goals met.             Patient will benefit from skilled therapeutic intervention in order to improve the following deficits and impairments:     Visit Diagnosis: Chronic bilateral low back pain, unspecified whether sciatica present  Muscle weakness (generalized)  Abnormal posture  PHYSICAL THERAPY DISCHARGE SUMMARY  Visits from Start of Care: 16  Current functional level related to goals / functional outcomes: See clinical impressions above   Remaining deficits: As above   Education / Equipment: HEP   Patient agrees to discharge. Patient goals were met. Patient is being discharged due to meeting the stated rehab goals.   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 [place], [place], [address] Phone: [phone]   Fax:  [phone]  Name: Crystal Bean MRN: [id number] Date of Birth: Oct 11, 1976

## 2021-05-16 ENCOUNTER — Other Ambulatory Visit: Payer: Self-pay | Admitting: Physician Assistant

## 2021-05-23 ENCOUNTER — Telehealth: Payer: Self-pay | Admitting: Physician Assistant

## 2021-05-23 ENCOUNTER — Ambulatory Visit: Payer: Medicare Other | Admitting: Physician Assistant

## 2021-05-23 NOTE — Telephone Encounter (Signed)
Pt called about refills on medications. Effexor XR 150mg , Xanax 1mg , and Adderall 10mg . Appt 7/29. Pharmacy [facility] [place], [place]

## 2021-05-24 ENCOUNTER — Other Ambulatory Visit: Payer: Self-pay

## 2021-05-24 MED ORDER — ALPRAZOLAM 1 MG PO TABS: ORAL_TABLET | ORAL | 0 refills | Status: DC

## 2021-05-24 MED ORDER — AMPHETAMINE-DEXTROAMPHET ER 10 MG PO CP24: 10.0000 mg | ORAL_CAPSULE | Freq: Two times a day (BID) | ORAL | 0 refills | Status: DC

## 2021-05-24 MED ORDER — VENLAFAXINE HCL ER 150 MG PO CP24: ORAL_CAPSULE | ORAL | 0 refills | Status: DC

## 2021-05-24 NOTE — Telephone Encounter (Signed)
Pended.

## 2021-05-29 ENCOUNTER — Other Ambulatory Visit: Payer: Self-pay

## 2021-05-29 ENCOUNTER — Ambulatory Visit (INDEPENDENT_AMBULATORY_CARE_PROVIDER_SITE_OTHER): Payer: Medicare Other | Admitting: Family Medicine

## 2021-05-29 ENCOUNTER — Other Ambulatory Visit: Payer: Self-pay | Admitting: *Deleted

## 2021-05-29 ENCOUNTER — Encounter (INDEPENDENT_AMBULATORY_CARE_PROVIDER_SITE_OTHER): Payer: Self-pay | Admitting: Family Medicine

## 2021-05-29 VITALS — BP 103/73 | HR 75 | Temp 98.3°F | Ht 66.0 in | Wt 229.0 lb

## 2021-05-29 DIAGNOSIS — E559 Vitamin D deficiency, unspecified: Secondary | ICD-10-CM

## 2021-05-29 DIAGNOSIS — R7303 Prediabetes: Secondary | ICD-10-CM

## 2021-05-29 DIAGNOSIS — Z6841 Body Mass Index (BMI) 40.0 and over, adult: Secondary | ICD-10-CM | POA: Diagnosis not present

## 2021-05-29 DIAGNOSIS — E782 Mixed hyperlipidemia: Secondary | ICD-10-CM | POA: Diagnosis not present

## 2021-05-29 DIAGNOSIS — E538 Deficiency of other specified B group vitamins: Secondary | ICD-10-CM

## 2021-05-29 MED ORDER — METFORMIN HCL ER 500 MG PO TB24: ORAL_TABLET | ORAL | 1 refills | Status: DC

## 2021-05-31 LAB — VITAMIN D 25 HYDROXY (VIT D DEFICIENCY, FRACTURES): Vit D, 25-Hydroxy: 42.8 ng/mL (ref 30.0–100.0)

## 2021-05-31 LAB — LIPID PANEL WITH LDL/HDL RATIO
Cholesterol, Total: 168 mg/dL (ref 100–199)
HDL: 35 mg/dL — ABNORMAL LOW (ref >39)
LDL Chol Calc (NIH): 114 mg/dL — ABNORMAL HIGH (ref 0–99)
LDL/HDL Ratio: 3.3 ratio — ABNORMAL HIGH (ref 0.0–3.2)
Triglycerides: 102 mg/dL (ref 0–149)
VLDL Cholesterol Cal: 19 mg/dL (ref 5–40)

## 2021-05-31 LAB — COMPREHENSIVE METABOLIC PANEL
ALT: 14 IU/L (ref 0–32)
AST: 13 IU/L (ref 0–40)
Albumin/Globulin Ratio: 1.5 (ref 1.2–2.2)
Albumin: 4.3 g/dL (ref 3.8–4.8)
Alkaline Phosphatase: 93 IU/L (ref 44–121)
BUN/Creatinine Ratio: 12 (ref 9–23)
BUN: 13 mg/dL (ref 6–24)
Bilirubin Total: 0.3 mg/dL (ref 0.0–1.2)
CO2: 24 mmol/L (ref 20–29)
Calcium: 9.3 mg/dL (ref 8.7–10.2)
Chloride: 97 mmol/L (ref 96–106)
Creatinine, Ser: 1.11 mg/dL — ABNORMAL HIGH (ref 0.57–1.00)
Globulin, Total: 2.8 g/dL (ref 1.5–4.5)
Glucose: 91 mg/dL (ref 65–99)
Potassium: 4.5 mmol/L (ref 3.5–5.2)
Sodium: 139 mmol/L (ref 134–144)
Total Protein: 7.1 g/dL (ref 6.0–8.5)
eGFR: 62 mL/min/{1.73_m2} (ref >59)

## 2021-05-31 LAB — HEMOGLOBIN A1C
Est. average glucose Bld gHb Est-mCnc: 117 mg/dL
Hgb A1c MFr Bld: 5.7 % — ABNORMAL HIGH (ref 4.8–5.6)

## 2021-05-31 LAB — INSULIN, RANDOM: INSULIN: 7.7 u[IU]/mL (ref 2.6–24.9)

## 2021-05-31 LAB — VITAMIN B12: Vitamin B-12: 1037 pg/mL (ref 232–1245)

## 2021-06-04 NOTE — Progress Notes (Signed)
Chief Complaint:   OBESITY Crystal Bean is here to discuss her progress with her obesity treatment plan along with follow-up of her obesity related diagnoses. Crystal Bean is on the Category 2 Plan and states she is following her eating plan approximately 94% of the time. Crystal Bean states she is extra walking and going to the gym 60 minutes 1 times per week.  Today's visit was #: 9 Starting weight: 252 lbs Starting date: 01/02/2021 Today's weight: 229 lbs Today's date: 05/29/2021 Total lbs lost to date: 23 lbs Total lbs lost since last in-office visit: 4 lbs  Interim History: Crystal Bean is adhering to the plan very well. She has only had a few mini cans of soda. She is drinking 1/2 to a gallon of sweet tea per day. She does not like sugar substitutes. She uses one cup of sugar per gallon of tea. She is not skipping lunch anymore. She joined [employer].  Subjective:   1. Mixed hyperlipidemia Crystal Bean's last LDL was elevated at 134. Her Triglycerides was elevated at 243. Her HDL is 50. She is not on statin.  Lab Results  Component Value Date   ALT 14 05/29/2021   AST 13 05/29/2021   ALKPHOS 93 05/29/2021   BILITOT 0.3 05/29/2021   Lab Results  Component Value Date   CHOL 168 05/29/2021   HDL 35 (L) 05/29/2021   LDLCALC 114 (H) 05/29/2021   LDLDIRECT 111.0 10/18/2019   TRIG 102 05/29/2021   CHOLHDL 4.5 (H) 01/02/2021     2. Pre-diabetes Crystal Bean's last A1C was elevated at 5.9. She is on Metformin and has been for about a year.  Lab Results  Component Value Date   HGBA1C 5.7 (H) 05/29/2021   Lab Results  Component Value Date   INSULIN 7.7 05/29/2021   INSULIN 14.4 01/02/2021     3. Vitamin D deficiency  Crystal Bean's Vitamin D is slightly low at 42 (goal 50). She is not on supplementation.  Lab Results  Component Value Date   VD25OH 42.8 05/29/2021   VD25OH 42.2 01/02/2021   VD25OH 29.6 11/28/2015    4. B12 deficiency Crystal Bean use to get B 12 shots. She is not currently on supplementation.She has been  on Metformin for 1 year.  CBC Latest Ref Rng & Units 01/02/2021 11/14/2020 09/20/2020  WBC 3.4 - 10.8 x10E3/uL 13.1(H) 10.0 10.5  Hemoglobin 11.1 - 15.9 g/dL 13.5 12.2 12.3  Hematocrit 34.0 - 46.6 % 42.8 37.0 35.9  Platelets 150 - 450 x10E3/uL 543(H) 498.0(H) 464(H)   No results found for: IRON, TIBC, FERRITIN Lab Results  Component Value Date   VITAMINB12 1,037 05/29/2021     Assessment/Plan:   1. Mixed hyperlipidemia We will check labs today - Lipid Panel With LDL/HDL Ratio  2. Pre-diabetes We will check labs today. - Comprehensive metabolic panel - Hemoglobin A1c - Insulin, random - Vitamin B12  3. Vitamin D deficiency Check:  - VITAMIN D 25 Hydroxy (Vit-D Deficiency, Fractures)  4. B12 deficiency We will check labs today. - Vitamin B12  5. Obesity, current BMI 36.9 Crystal Bean is currently in the action stage of change. As such, her goal is to continue with weight loss efforts. She has agreed to the Category 2 Plan.   Crystal Bean will try to cut back on tea to 1 quart per day.  Exercise goals:  Go to [place] 3 times a week.  Behavioral modification strategies: decreasing liquid calories.  [name] has agreed to follow-up with our clinic in 3 weeks. Crystal Bean was  informed we would discuss her lab results at her next visit unless there is a critical issue that needs to be addressed sooner. Crystal Bean agreed to keep her next visit at the agreed upon time to discuss these results.  Objective:   Blood pressure 103/73, pulse 75, temperature 98.3 F (36.8 C), height 5\' 6"  (1.676 m), weight 229 lb (103.9 kg), last menstrual period 05/26/2021, SpO2 97 %. Body mass index is 36.96 kg/m.  General: Cooperative, alert, well developed, in no acute distress. HEENT: Conjunctivae and lids unremarkable. Cardiovascular: Regular rhythm.  Lungs: Normal work of breathing. Neurologic: No focal deficits.   Lab Results  Component Value Date   CREATININE 1.11 (H) 05/29/2021   BUN 13 05/29/2021   NA 139  05/29/2021   K 4.5 05/29/2021   CL 97 05/29/2021   CO2 24 05/29/2021   Lab Results  Component Value Date   ALT 14 05/29/2021   AST 13 05/29/2021   ALKPHOS 93 05/29/2021   BILITOT 0.3 05/29/2021   Lab Results  Component Value Date   HGBA1C 5.7 (H) 05/29/2021   HGBA1C 5.9 (H) 01/02/2021   HGBA1C 5.7 (H) 09/20/2020   HGBA1C 6.0 10/18/2019   HGBA1C 6.2 08/02/2019   Lab Results  Component Value Date   INSULIN 7.7 05/29/2021   INSULIN 14.4 01/02/2021   Lab Results  Component Value Date   TSH 2.410 01/02/2021   Lab Results  Component Value Date   CHOL 168 05/29/2021   HDL 35 (L) 05/29/2021   LDLCALC 114 (H) 05/29/2021   LDLDIRECT 111.0 10/18/2019   TRIG 102 05/29/2021   CHOLHDL 4.5 (H) 01/02/2021   Lab Results  Component Value Date   VD25OH 42.8 05/29/2021   VD25OH 42.2 01/02/2021   VD25OH 29.6 11/28/2015   Lab Results  Component Value Date   WBC 13.1 (H) 01/02/2021   HGB 13.5 01/02/2021   HCT 42.8 01/02/2021   MCV 88 01/02/2021   PLT 543 (H) 01/02/2021   No results found for: IRON, TIBC, FERRITIN  Obesity Behavioral Intervention:   Approximately 15 minutes were spent on the discussion below.  ASK: We discussed the diagnosis of obesity with Crystal Bean today and Crystal Bean agreed to give [place] permission to discuss obesity behavioral modification therapy today.  ASSESS: Crystal Bean has the diagnosis of obesity and her BMI today is 37.1. Crystal Bean is in the action stage of change.   ADVISE: Crystal Bean was educated on the multiple health risks of obesity as well as the benefit of weight loss to improve her health. She was advised of the need for long term treatment and the importance of lifestyle modifications to improve her current health and to decrease her risk of future health problems.  AGREE: Multiple dietary modification options and treatment options were discussed and Crystal Bean agreed to follow the recommendations documented in the above note.  ARRANGE: Crystal Bean was educated on the  importance of frequent visits to treat obesity as outlined per CMS and USPSTF guidelines and agreed to schedule her next follow up appointment today.  Attestation Statements:   Reviewed by clinician on day of visit: allergies, medications, problem list, medical history, surgical history, family history, social history, and previous encounter notes.  I, Lizbeth Bark, RMA, am acting as Location manager for [employer], [place].  I have reviewed the above documentation for accuracy and completeness, and I agree with the above. -  Georgianne Fick, FNP

## 2021-06-05 ENCOUNTER — Encounter (INDEPENDENT_AMBULATORY_CARE_PROVIDER_SITE_OTHER): Payer: Self-pay | Admitting: Family Medicine

## 2021-06-05 ENCOUNTER — Other Ambulatory Visit: Payer: Self-pay | Admitting: *Deleted

## 2021-06-05 DIAGNOSIS — E538 Deficiency of other specified B group vitamins: Secondary | ICD-10-CM | POA: Insufficient documentation

## 2021-06-05 DIAGNOSIS — E559 Vitamin D deficiency, unspecified: Secondary | ICD-10-CM | POA: Insufficient documentation

## 2021-06-05 DIAGNOSIS — E782 Mixed hyperlipidemia: Secondary | ICD-10-CM | POA: Insufficient documentation

## 2021-06-05 MED ORDER — DICLOFENAC SODIUM 75 MG PO TBEC: 75.0000 mg | DELAYED_RELEASE_TABLET | Freq: Two times a day (BID) | ORAL | 0 refills | Status: DC

## 2021-06-15 ENCOUNTER — Ambulatory Visit: Payer: Medicare Other | Admitting: Physician Assistant

## 2021-06-19 ENCOUNTER — Ambulatory Visit (INDEPENDENT_AMBULATORY_CARE_PROVIDER_SITE_OTHER): Payer: Medicare Other | Admitting: Family Medicine

## 2021-06-19 ENCOUNTER — Encounter (INDEPENDENT_AMBULATORY_CARE_PROVIDER_SITE_OTHER): Payer: Self-pay | Admitting: Family Medicine

## 2021-06-19 ENCOUNTER — Other Ambulatory Visit: Payer: Self-pay

## 2021-06-19 VITALS — BP 114/76 | HR 76 | Temp 98.3°F | Ht 66.0 in | Wt 228.0 lb

## 2021-06-19 DIAGNOSIS — R7303 Prediabetes: Secondary | ICD-10-CM

## 2021-06-19 DIAGNOSIS — Z6841 Body Mass Index (BMI) 40.0 and over, adult: Secondary | ICD-10-CM

## 2021-06-19 DIAGNOSIS — I10 Essential (primary) hypertension: Secondary | ICD-10-CM

## 2021-06-19 DIAGNOSIS — Z9189 Other specified personal risk factors, not elsewhere classified: Secondary | ICD-10-CM

## 2021-06-19 DIAGNOSIS — E782 Mixed hyperlipidemia: Secondary | ICD-10-CM

## 2021-06-19 DIAGNOSIS — E785 Hyperlipidemia, unspecified: Secondary | ICD-10-CM | POA: Diagnosis not present

## 2021-06-24 ENCOUNTER — Other Ambulatory Visit: Payer: Self-pay | Admitting: Physician Assistant

## 2021-06-25 ENCOUNTER — Other Ambulatory Visit: Payer: Self-pay

## 2021-06-25 ENCOUNTER — Telehealth: Payer: Self-pay | Admitting: Physician Assistant

## 2021-06-25 NOTE — Progress Notes (Signed)
Chief Complaint:   OBESITY Crystal Bean is here to discuss her progress with her obesity treatment plan along with follow-up of her obesity related diagnoses.   Today's visit was #: 10 Starting weight: 252 lbs Starting date: 01/02/2021 Today's weight: 228 lbs Today's date: 06/19/2021 Weight change since last visit: 1 lbs Total lbs lost to date: 24 lbs Body mass index is 36.8 kg/m.  Total weight loss percentage to date: -9.52%  Interim History:  Crystal Bean has been exercising regularly. Her body composition shows excellent changes:     05/29/2021 06/19/2021  Weight 229 lb (103.9 kg) 228 lb (103.4 kg)  BMI (Calculated) 36.98 36.82  BLOOD PRESSURE - SYSTOLIC [id number]  BLOOD PRESSURE - DIASTOLIC 73 76  PULSE 75 76   Body Fat % 45.5 % 40.4 %  Muscle Mass (lbs) 118.8 lbs 129 lbs  Fat Mass (lbs) 104.6 lbs 92.2 lbs  Total Body Water (lbs) 85 lbs 86.4 lbs  Visceral Fat Rating  12 10   Current Meal Plan: the Category 2 Plan for 95% of the time.  Current Exercise Plan: Swimming and cardio for 45 minutes 3 times per week.  Assessment/Plan:   1. Pre-diabetes At goal. Goal is HgbA1c < 5.7.  Medication: metformin 1,000 mg every morning.    Plan:  She will continue to focus on protein-rich, low simple carbohydrate foods. We reviewed the importance of hydration, regular exercise for stress reduction, and restorative sleep.   Lab Results  Component Value Date   HGBA1C 5.7 (H) 05/29/2021   Lab Results  Component Value Date   INSULIN 7.7 05/29/2021   INSULIN 14.4 01/02/2021   2. Dyslipidemia Course: Not at goal. Lipid-lowering medications: None.   Plan: Dietary changes: Increase soluble fiber, decrease simple carbohydrates, decrease saturated fat. Exercise changes: Moderate to vigorous-intensity aerobic activity 150 minutes per week or as tolerated. We will continue to monitor along with PCP/specialists as it pertains to her weight loss journey.  Lab Results  Component Value Date   CHOL 168  05/29/2021   HDL 35 (L) 05/29/2021   LDLCALC 114 (H) 05/29/2021   LDLDIRECT 111.0 10/18/2019   TRIG 102 05/29/2021   CHOLHDL 4.5 (H) 01/02/2021   Lab Results  Component Value Date   ALT 14 05/29/2021   AST 13 05/29/2021   ALKPHOS 93 05/29/2021   BILITOT 0.3 05/29/2021   The 10-year ASCVD risk score Mikey Bussing DC Jr., et al., 2013) is: 1.3%   Values used to calculate the score:     Age: 6 years     Sex: Female     Is Non-Hispanic African American: No     Diabetic: No     Tobacco smoker: No     Systolic Blood Pressure: 99991111 mmHg     Is BP treated: Yes     HDL Cholesterol: 35 mg/dL     Total Cholesterol: 168 mg/dL  3. Essential hypertension At goal. Medications: losartan 50 mg daily.   Plan: Avoid buying foods that are: processed, frozen, or prepackaged to avoid excess salt. We will watch for signs of hypotension as she continues lifestyle modifications.  BP Readings from Last 3 Encounters:  06/19/21 114/76  05/29/21 103/73  05/07/21 99/67   Lab Results  Component Value Date   CREATININE 1.11 (H) 05/29/2021   4. Obesity, current BMI 36.8  Course: Crystal Bean is currently in the action stage of change. As such, her goal is to continue with weight loss efforts.   Nutrition goals: She  has agreed to the Category 2 Plan.   Exercise goals:  As is.  Behavioral modification strategies: increasing lean protein intake, decreasing simple carbohydrates, increasing vegetables, and increasing water intake.  Crystal Bean has agreed to follow-up with our clinic in 3 weeks. She was informed of the importance of frequent follow-up visits to maximize her success with intensive lifestyle modifications for her multiple health conditions.   Objective:   Blood pressure 114/76, pulse 76, temperature 98.3 F (36.8 C), height '5\' 6"'$  (1.676 m), weight 228 lb (103.4 kg), last menstrual period 05/26/2021, SpO2 97 %. Body mass index is 36.8 kg/m.  General: Cooperative, alert, well developed, in no acute  distress. HEENT: Conjunctivae and lids unremarkable. Cardiovascular: Regular rhythm.  Lungs: Normal work of breathing. Neurologic: No focal deficits.   Lab Results  Component Value Date   CREATININE 1.11 (H) 05/29/2021   BUN 13 05/29/2021   NA 139 05/29/2021   K 4.5 05/29/2021   CL 97 05/29/2021   CO2 24 05/29/2021   Lab Results  Component Value Date   ALT 14 05/29/2021   AST 13 05/29/2021   ALKPHOS 93 05/29/2021   BILITOT 0.3 05/29/2021   Lab Results  Component Value Date   HGBA1C 5.7 (H) 05/29/2021   HGBA1C 5.9 (H) 01/02/2021   HGBA1C 5.7 (H) 09/20/2020   HGBA1C 6.0 10/18/2019   HGBA1C 6.2 08/02/2019   Lab Results  Component Value Date   INSULIN 7.7 05/29/2021   INSULIN 14.4 01/02/2021   Lab Results  Component Value Date   TSH 2.410 01/02/2021   Lab Results  Component Value Date   CHOL 168 05/29/2021   HDL 35 (L) 05/29/2021   LDLCALC 114 (H) 05/29/2021   LDLDIRECT 111.0 10/18/2019   TRIG 102 05/29/2021   CHOLHDL 4.5 (H) 01/02/2021   Lab Results  Component Value Date   VD25OH 42.8 05/29/2021   VD25OH 42.2 01/02/2021   VD25OH 29.6 11/28/2015   Lab Results  Component Value Date   WBC 13.1 (H) 01/02/2021   HGB 13.5 01/02/2021   HCT 42.8 01/02/2021   MCV 88 01/02/2021   PLT 543 (H) 01/02/2021   Obesity Behavioral Intervention:   Approximately 15 minutes were spent on the discussion below.  ASK: We discussed the diagnosis of obesity with Terrisha today and Ethal agreed to give [place] permission to discuss obesity behavioral modification therapy today.  ASSESS: Crystal Bean has the diagnosis of obesity and her BMI today is 36.8. Crystal Bean is in the action stage of change.   ADVISE: Crystal Bean was educated on the multiple health risks of obesity as well as the benefit of weight loss to improve her health. She was advised of the need for long term treatment and the importance of lifestyle modifications to improve her current health and to decrease her risk of future health  problems.  AGREE: Multiple dietary modification options and treatment options were discussed and Crystal Bean agreed to follow the recommendations documented in the above note.  ARRANGE: Crystal Bean was educated on the importance of frequent visits to treat obesity as outlined per CMS and USPSTF guidelines and agreed to schedule her next follow up appointment today.  Attestation Statements:   Reviewed by clinician on day of visit: allergies, medications, problem list, medical history, surgical history, family history, social history, and previous encounter notes.  I, Water quality scientist, CMA, am acting as transcriptionist for Briscoe Deutscher, DO  I have reviewed the above documentation for accuracy and completeness, and I agree with the above. Danae Chen  Juleen China, DO

## 2021-06-25 NOTE — Telephone Encounter (Signed)
Need to get a Rf of Adderall sent in to [facility]. Appt 9/19

## 2021-06-25 NOTE — Telephone Encounter (Signed)
Last refill 07/07 Pended for Helene Kelp to send

## 2021-06-26 MED ORDER — AMPHETAMINE-DEXTROAMPHET ER 10 MG PO CP24: 10.0000 mg | ORAL_CAPSULE | Freq: Two times a day (BID) | ORAL | 0 refills | Status: DC

## 2021-07-10 ENCOUNTER — Encounter (INDEPENDENT_AMBULATORY_CARE_PROVIDER_SITE_OTHER): Payer: Self-pay | Admitting: Family Medicine

## 2021-07-10 ENCOUNTER — Telehealth (INDEPENDENT_AMBULATORY_CARE_PROVIDER_SITE_OTHER): Payer: Medicare Other | Admitting: Family Medicine

## 2021-07-10 VITALS — Ht 66.0 in

## 2021-07-10 DIAGNOSIS — Z6841 Body Mass Index (BMI) 40.0 and over, adult: Secondary | ICD-10-CM

## 2021-07-10 DIAGNOSIS — R7303 Prediabetes: Secondary | ICD-10-CM

## 2021-07-10 NOTE — Progress Notes (Signed)
TeleHealth Visit:  Due to the COVID-19 pandemic, this visit was completed with telemedicine (audio/video) technology to reduce patient and provider exposure as well as to preserve personal protective equipment.   Crystal Bean has verbally consented to this TeleHealth visit. The patient is located at home, the provider is located at the [facility] office. The participants in this visit include the listed provider and patient. The visit was conducted today via video.  Chief Complaint: OBESITY Crystal Bean is here to discuss her progress with her obesity treatment plan along with follow-up of her obesity related diagnoses. Crystal Bean is on the Category 2 Plan and states she is following her eating plan approximately 90% of the time. Crystal Bean states she is going to the gym, doing a combo of resistance and cardio 3 times per week.  Today's visit was #: 11 Starting weight: 252 lbs Starting date: 01/02/2021  Interim History: Crystal Bean has Covid and we are doing a virtual visit. Today. She is going to [employer] 3 times per week. Her hunger is well controlled. She notes she no longer has to set an Alarm for lunch. She drinks 1/2 sweet tea. She has cut back on soda -  she has had only one soda in 5 weeks. She was having 1-2 per day.  Subjective:   1. Pre-diabetes Crystal Bean' last A1C was 5.6. She is on Metformin 1000 every day.  Lab Results  Component Value Date   HGBA1C 5.7 (H) 05/29/2021   Lab Results  Component Value Date   INSULIN 7.7 05/29/2021   INSULIN 14.4 01/02/2021    Assessment/Plan:   1. Pre-diabetes Crystal Bean will continue Metformin. She will continue to work on weight loss, exercise, and decreasing simple carbohydrates to help decrease the risk of diabetes.    2. Obesity, current BMI 36.82 Crystal Bean is currently in the action stage of change. As such, her goal is to continue with weight loss efforts. She has agreed to the Category 2 Plan.   Encouraged Crystal Bean to cut back on 1/2 sweet tea. Try to keep  tea < than 200 calories per day. She is currently drinking about 250 calories worth of tea per day.  Exercise goals:  As is.  Behavioral modification strategies: decreasing liquid calories.  Shawntelle has agreed to follow-up with our clinic in 3 weeks with Crystal Bean.  Objective:   VITALS: Per patient if applicable, see vitals. GENERAL: Alert and in no acute distress. CARDIOPULMONARY: No increased WOB. Speaking in clear sentences.  PSYCH: Pleasant and cooperative. Speech normal rate and rhythm. Affect is appropriate. Insight and judgement are appropriate. Attention is focused, linear, and appropriate.  NEURO: Oriented as arrived to appointment on time with no prompting.   Lab Results  Component Value Date   CREATININE 1.11 (H) 05/29/2021   BUN 13 05/29/2021   NA 139 05/29/2021   K 4.5 05/29/2021   CL 97 05/29/2021   CO2 24 05/29/2021   Lab Results  Component Value Date   ALT 14 05/29/2021   AST 13 05/29/2021   ALKPHOS 93 05/29/2021   BILITOT 0.3 05/29/2021   Lab Results  Component Value Date   HGBA1C 5.7 (H) 05/29/2021   HGBA1C 5.9 (H) 01/02/2021   HGBA1C 5.7 (H) 09/20/2020   HGBA1C 6.0 10/18/2019   HGBA1C 6.2 08/02/2019   Lab Results  Component Value Date   INSULIN 7.7 05/29/2021   INSULIN 14.4 01/02/2021   Lab Results  Component Value Date   TSH 2.410 01/02/2021   Lab Results  Component Value Date   CHOL 168 05/29/2021   HDL 35 (L) 05/29/2021   LDLCALC 114 (H) 05/29/2021   LDLDIRECT 111.0 10/18/2019   TRIG 102 05/29/2021   CHOLHDL 4.5 (H) 01/02/2021   Lab Results  Component Value Date   VD25OH 42.8 05/29/2021   VD25OH 42.2 01/02/2021   VD25OH 29.6 11/28/2015   Lab Results  Component Value Date   WBC 13.1 (H) 01/02/2021   HGB 13.5 01/02/2021   HCT 42.8 01/02/2021   MCV 88 01/02/2021   PLT 543 (H) 01/02/2021   No results found for: IRON, TIBC, FERRITIN  Attestation Statements:   Reviewed by clinician on day of visit: allergies, medications,  problem list, medical history, surgical history, family history, social history, and previous encounter notes.  I, Lizbeth Bark, RMA, am acting as Location manager for [employer], [place].   I have reviewed the above documentation for accuracy and completeness, and I agree with the above. - Georgianne Fick, FNP

## 2021-08-01 ENCOUNTER — Ambulatory Visit (INDEPENDENT_AMBULATORY_CARE_PROVIDER_SITE_OTHER): Payer: Medicare Other | Admitting: Family Medicine

## 2021-08-01 ENCOUNTER — Other Ambulatory Visit: Payer: Self-pay

## 2021-08-01 ENCOUNTER — Other Ambulatory Visit: Payer: Self-pay | Admitting: Family Medicine

## 2021-08-01 ENCOUNTER — Encounter (INDEPENDENT_AMBULATORY_CARE_PROVIDER_SITE_OTHER): Payer: Self-pay | Admitting: Family Medicine

## 2021-08-01 VITALS — BP 108/66 | HR 72 | Temp 98.0°F | Ht 66.0 in | Wt 224.0 lb

## 2021-08-01 DIAGNOSIS — R7303 Prediabetes: Secondary | ICD-10-CM

## 2021-08-01 DIAGNOSIS — E7849 Other hyperlipidemia: Secondary | ICD-10-CM | POA: Diagnosis not present

## 2021-08-01 DIAGNOSIS — Z6841 Body Mass Index (BMI) 40.0 and over, adult: Secondary | ICD-10-CM | POA: Diagnosis not present

## 2021-08-01 DIAGNOSIS — I10 Essential (primary) hypertension: Secondary | ICD-10-CM

## 2021-08-01 NOTE — Progress Notes (Signed)
Chief Complaint:   OBESITY Lonni is here to discuss her progress with her obesity treatment plan along with follow-up of her obesity related diagnoses.   Today's visit was #: 12 Starting weight: 252 lbs Starting date: 01/02/2021 Today's weight: 224 lbs Today's date: 08/01/2021 Weight change since last visit: 4 lbs Total lbs lost to date: 28 lbs Body mass index is 36.15 kg/m.  Total weight loss percentage to date: -11.11%  Current Meal Plan: the Category 2 Plan for 75% of the time.  Current Exercise Plan: None.  Interim History:  Alyah recently had COVID.  She has not been to the gym in 1 month secondary to [place] and her dog dying.  She says she got a new puppy, who is an emotional support animal.  Assessment/Plan:   1. Essential hypertension At goal. Medications: She is taking 1/2 tablet losartan (25 mg).  Discussed low blood pressure symptoms and stopping losartan if needed.   Plan: Avoid buying foods that are: processed, frozen, or prepackaged to avoid excess salt. We will watch for signs of hypotension as she continues lifestyle modifications.  BP Readings from Last 3 Encounters:  08/01/21 108/66  06/19/21 114/76  05/29/21 103/73   Lab Results  Component Value Date   CREATININE 1.11 (H) 05/29/2021   2. Pre-diabetes At goal. Goal is HgbA1c < 5.7.  Medication: metformin XR 1,000 mg every morning.  Metformin was refilled by her PCP this today.    Plan:  She will continue to focus on protein-rich, low simple carbohydrate foods. We reviewed the importance of hydration, regular exercise for stress reduction, and restorative sleep.   Lab Results  Component Value Date   HGBA1C 5.7 (H) 05/29/2021   Lab Results  Component Value Date   INSULIN 7.7 05/29/2021   INSULIN 14.4 01/02/2021   3. Other hyperlipidemia Course: Not at goal. Lipid-lowering medications: None.   Plan: Dietary changes: Increase soluble fiber, decrease simple carbohydrates, decrease saturated fat.  Exercise changes: Moderate to vigorous-intensity aerobic activity 150 minutes per week or as tolerated. We will continue to monitor along with PCP/specialists as it pertains to her weight loss journey.  Lab Results  Component Value Date   CHOL 168 05/29/2021   HDL 35 (L) 05/29/2021   LDLCALC 114 (H) 05/29/2021   LDLDIRECT 111.0 10/18/2019   TRIG 102 05/29/2021   CHOLHDL 4.5 (H) 01/02/2021   Lab Results  Component Value Date   ALT 14 05/29/2021   AST 13 05/29/2021   ALKPHOS 93 05/29/2021   BILITOT 0.3 05/29/2021   The 10-year ASCVD risk score (Arnett DK, et al., 2019) is: 1.2%   Values used to calculate the score:     Age: 24 years     Sex: Female     Is Non-Hispanic African American: No     Diabetic: No     Tobacco smoker: No     Systolic Blood Pressure: 123XX123 mmHg     Is BP treated: Yes     HDL Cholesterol: 35 mg/dL     Total Cholesterol: 168 mg/dL  4. Obesity, current BMI 36.2  Course: Mateya is currently in the action stage of change. As such, her goal is to continue with weight loss efforts.   Nutrition goals: She has agreed to the Category 2 Plan.   Exercise goals: For substantial health benefits, adults should do at least 150 minutes (2 hours and 30 minutes) a week of moderate-intensity, or 75 minutes (1 hour and 15 minutes) a  week of vigorous-intensity aerobic physical activity, or an equivalent combination of moderate- and vigorous-intensity aerobic activity. Aerobic activity should be performed in episodes of at least 10 minutes, and preferably, it should be spread throughout the week.  Behavioral modification strategies: increasing lean protein intake, decreasing simple carbohydrates, increasing vegetables, increasing water intake, and decreasing liquid calories.  Sherial has agreed to follow-up with our clinic in 4 weeks. She was informed of the importance of frequent follow-up visits to maximize her success with intensive lifestyle modifications for her multiple health  conditions.   Objective:   Blood pressure 108/66, pulse 72, temperature 98 F (36.7 C), temperature source Oral, height '5\' 6"'$  (1.676 m), weight 224 lb (101.6 kg), SpO2 98 %. Body mass index is 36.15 kg/m.  General: Cooperative, alert, well developed, in no acute distress. HEENT: Conjunctivae and lids unremarkable. Cardiovascular: Regular rhythm.  Lungs: Normal work of breathing. Neurologic: No focal deficits.   Lab Results  Component Value Date   CREATININE 1.11 (H) 05/29/2021   BUN 13 05/29/2021   NA 139 05/29/2021   K 4.5 05/29/2021   CL 97 05/29/2021   CO2 24 05/29/2021   Lab Results  Component Value Date   ALT 14 05/29/2021   AST 13 05/29/2021   ALKPHOS 93 05/29/2021   BILITOT 0.3 05/29/2021   Lab Results  Component Value Date   HGBA1C 5.7 (H) 05/29/2021   HGBA1C 5.9 (H) 01/02/2021   HGBA1C 5.7 (H) 09/20/2020   HGBA1C 6.0 10/18/2019   HGBA1C 6.2 08/02/2019   Lab Results  Component Value Date   INSULIN 7.7 05/29/2021   INSULIN 14.4 01/02/2021   Lab Results  Component Value Date   TSH 2.410 01/02/2021   Lab Results  Component Value Date   CHOL 168 05/29/2021   HDL 35 (L) 05/29/2021   LDLCALC 114 (H) 05/29/2021   LDLDIRECT 111.0 10/18/2019   TRIG 102 05/29/2021   CHOLHDL 4.5 (H) 01/02/2021   Lab Results  Component Value Date   VD25OH 42.8 05/29/2021   VD25OH 42.2 01/02/2021   VD25OH 29.6 11/28/2015   Lab Results  Component Value Date   WBC 13.1 (H) 01/02/2021   HGB 13.5 01/02/2021   HCT 42.8 01/02/2021   MCV 88 01/02/2021   PLT 543 (H) 01/02/2021   Obesity Behavioral Intervention:   Approximately 15 minutes were spent on the discussion below.  ASK: We discussed the diagnosis of obesity with Charleen today and Edee agreed to give [place] permission to discuss obesity behavioral modification therapy today.  ASSESS: Mayleigh has the diagnosis of obesity and her BMI today is 36.2. Quenesha is in the action stage of change.   ADVISE: Arina was educated on  the multiple health risks of obesity as well as the benefit of weight loss to improve her health. She was advised of the need for long term treatment and the importance of lifestyle modifications to improve her current health and to decrease her risk of future health problems.  AGREE: Multiple dietary modification options and treatment options were discussed and Nadiyah agreed to follow the recommendations documented in the above note.  ARRANGE: Hennie was educated on the importance of frequent visits to treat obesity as outlined per CMS and USPSTF guidelines and agreed to schedule her next follow up appointment today.  Attestation Statements:   Reviewed by clinician on day of visit: allergies, medications, problem list, medical history, surgical history, family history, social history, and previous encounter notes.  I, Water quality scientist, CMA, am acting as  transcriptionist for Briscoe Deutscher, DO  I have reviewed the above documentation for accuracy and completeness, and I agree with the above. Briscoe Deutscher, DO

## 2021-08-06 ENCOUNTER — Encounter: Payer: Self-pay | Admitting: Physician Assistant

## 2021-08-06 ENCOUNTER — Other Ambulatory Visit: Payer: Self-pay

## 2021-08-06 ENCOUNTER — Ambulatory Visit (INDEPENDENT_AMBULATORY_CARE_PROVIDER_SITE_OTHER): Payer: Medicare Other | Admitting: Physician Assistant

## 2021-08-06 DIAGNOSIS — F4010 Social phobia, unspecified: Secondary | ICD-10-CM

## 2021-08-06 DIAGNOSIS — F902 Attention-deficit hyperactivity disorder, combined type: Secondary | ICD-10-CM | POA: Diagnosis not present

## 2021-08-06 DIAGNOSIS — F3341 Major depressive disorder, recurrent, in partial remission: Secondary | ICD-10-CM

## 2021-08-06 DIAGNOSIS — F411 Generalized anxiety disorder: Secondary | ICD-10-CM | POA: Diagnosis not present

## 2021-08-06 DIAGNOSIS — F422 Mixed obsessional thoughts and acts: Secondary | ICD-10-CM | POA: Diagnosis not present

## 2021-08-06 DIAGNOSIS — F4321 Adjustment disorder with depressed mood: Secondary | ICD-10-CM

## 2021-08-06 DIAGNOSIS — F4310 Post-traumatic stress disorder, unspecified: Secondary | ICD-10-CM

## 2021-08-06 MED ORDER — AMPHETAMINE-DEXTROAMPHET ER 10 MG PO CP24: 10.0000 mg | ORAL_CAPSULE | Freq: Two times a day (BID) | ORAL | 0 refills | Status: DC

## 2021-08-06 MED ORDER — LORAZEPAM 0.5 MG PO TABS: 0.2500 mg | ORAL_TABLET | Freq: Three times a day (TID) | ORAL | 0 refills | Status: DC | PRN

## 2021-08-06 MED ORDER — VENLAFAXINE HCL ER 150 MG PO CP24: ORAL_CAPSULE | ORAL | 3 refills | Status: DC

## 2021-08-06 NOTE — Progress Notes (Signed)
[facility]  Patient ID: Crystal Bean,  MRN: [id number]  PCP: Inda Coke, PA  Date of Evaluation: 08/06/2021 Time spent:40 minutes  Chief Complaint:  Chief Complaint   Follow-up; Anxiety; Depression; ADD      HISTORY/CURRENT STATUS: HPI For routine med check.  Xanax helps, but she feels really groggy for the whole next day.  She rarely takes it but she does not like the way it makes her feel.  It does help the anxiety but not sure if the trade-off is worth it.  Wonders if using Klonopin would help her but without causing the grogginess.  She and husband lost their dog last month. "The hardest thing I've ever had to do, is put our dog down. Our dog was my support animal." They got another dog not long ago. A puppy named Scarlet. If it wasn't for this sadness, thinks her meds are working well.   Patient denies loss of interest in usual activities and is able to enjoy things.  Denies decreased energy or motivation.  Appetite has not changed.  No extreme sadness, tearfulness, or feelings of hopelessness.  Denies suicidal or homicidal thoughts.  States that attention is good without easy distractibility.  Able to focus on things and finish tasks to completion.   Patient denies increased energy with decreased need for sleep, no increased talkativeness, no racing thoughts, no impulsivity or risky behaviors, no increased spending, no increased libido, no grandiosity, no increased irritability or anger, and no hallucinations.  Denies dizziness, syncope, seizures, numbness, tingling, tremor, tics, unsteady gait, slurred speech, confusion. Denies muscle or joint pain, stiffness, or dystonia. Denies unexplained weight loss, frequent infections, or sores that heal slowly.  No polyphagia, polydipsia, or polyuria. Denies visual changes or paresthesias.   Individual Medical History/ Review of Systems: Changes? :No   Past medications for mental health diagnoses include: Prozac,  Celexa, Lexapro, Wellbutrin, Effexor XR, Pristiq, Abilify,  Adderall, Ritalin, Klonopin, Xanax, Gabapentin, Buspar  Allergies: Meloxicam and Penicillins  Current Medications:  Current Outpatient Medications:    ARIPiprazole (ABILIFY) 10 MG tablet, TAKE ONE TABLET BY MOUTH DAILY, Disp: 90 tablet, Rfl: 3   aspirin 81 MG chewable tablet, Chew by mouth daily., Disp: , Rfl:    buPROPion (WELLBUTRIN XL) 300 MG 24 hr tablet, TAKE ONE TABLET BY MOUTH DAILY, Disp: 90 tablet, Rfl: 0   Cholecalciferol (VITAMIN D3) 10 MCG (400 UNIT) CAPS, Take by mouth., Disp: , Rfl:    diclofenac (VOLTAREN) 75 MG EC tablet, Take 1 tablet (75 mg total) by mouth 2 (two) times daily., Disp: 60 tablet, Rfl: 0   esomeprazole (NEXIUM) 20 MG capsule, Take 20 mg by mouth daily at 12 noon., Disp: , Rfl:    gabapentin (NEURONTIN) 100 MG capsule, TAKE 1 CAPSULE(S) BY MOUTH DAILY . MAY INCRASE TO 3 CAPSULES DAILY AS TOLERATED, Disp: 90 capsule, Rfl: 0   LORazepam (ATIVAN) 0.5 MG tablet, Take 0.5-1 tablets (0.25-0.5 mg total) by mouth every 8 (eight) hours as needed for anxiety., Disp: 30 tablet, Rfl: 0   losartan (COZAAR) 50 MG tablet, Take 1 tablet by mouth once daily (Patient taking differently: Take 25 mg by mouth daily.), Disp: 90 tablet, Rfl: 0   Magnesium 250 MG TABS, Take by mouth., Disp: , Rfl:    metFORMIN (GLUCOPHAGE-XR) 500 MG 24 hr tablet, TAKE TWO TABLETS BY MOUTH EVERY MORNING WITH BREAKFAST, Disp: 60 tablet, Rfl: 1   Multiple Vitamin (MULTIVITAMIN) tablet, Take 1 tablet by mouth daily., Disp: , Rfl:  triamcinolone ointment (KENALOG) 0.5 %, Apply 1 application topically 2 (two) times daily., Disp: 30 g, Rfl: 1   [START ON 10/04/2021] amphetamine-dextroamphetamine (ADDERALL XR) 10 MG 24 hr capsule, Take 1 capsule (10 mg total) by mouth 2 (two) times daily with breakfast and lunch., Disp: 60 capsule, Rfl: 0   [START ON 09/04/2021] amphetamine-dextroamphetamine (ADDERALL XR) 10 MG 24 hr capsule, Take 1 capsule (10 mg  total) by mouth 2 (two) times daily with breakfast and lunch., Disp: 60 capsule, Rfl: 0   amphetamine-dextroamphetamine (ADDERALL XR) 10 MG 24 hr capsule, Take 1 capsule (10 mg total) by mouth 2 (two) times daily with breakfast and lunch., Disp: 60 capsule, Rfl: 0   venlafaxine XR (EFFEXOR-XR) 150 MG 24 hr capsule, TAKE TWO CAPSULES BY MOUTH EVERY MORNING WITH BREAKFAST, Disp: 180 capsule, Rfl: 3 Medication Side Effects: none  Family Medical/ Social History: Changes? Yes see above  MENTAL HEALTH EXAM:  There were no vitals taken for this visit.There is no height or weight on file to calculate BMI.  General Appearance: Casual, Well Groomed and Obese  Eye Contact:  Good  Speech:  Clear and Coherent and Normal Rate  Volume:  Normal  Mood:  Euthymic  Affect:   Gets a little tearful when talking about losing her dog.  Consoled easily.  Thought Process:  Goal Directed and Descriptions of Associations: Circumstantial  Orientation:  Full (Time, Place, and Person)  Thought Content: Logical   Suicidal Thoughts:  No  Homicidal Thoughts:  No  Memory:  WNL  Judgement:  Good  Insight:  Good  Psychomotor Activity:  Normal  Concentration:  Concentration: Good and Attention Span: Good  Recall:  Good  Fund of Knowledge: Good  Language: Good  Assets:  Desire for Improvement  ADL's:  Intact  Cognition: WNL  Prognosis:  Good   05/29/2021   labs were reviewed.  DIAGNOSES:    ICD-10-CM   1. Generalized anxiety disorder  F41.1     2. Recurrent major depressive disorder, in partial remission ([place])  F33.41     3. Attention deficit hyperactivity disorder (ADHD), combined type  F90.2     4. Mixed obsessional thoughts and acts  F42.2     5. Social phobia  F40.10     6. PTSD (post-traumatic stress disorder)  F43.10     7. Grief  F43.21        Receiving Psychotherapy: Yes  Leane Platt    RECOMMENDATIONS:  PDMP was reviewed.  Last Adderall filled 06/26/2021.  Last Xanax filled 05/24/2021. I  provided 40 minutes of face to face time during this encounter, including time spent before and after the visit in records review, medical decision making, counseling pertinent to today's visit, and charting.  My condolences and the loss of her dog. Any of the benzos can cause grogginess and brain fog but it will be okay to try something different.  I recommend Ativan because it is not as strong and does not cause drowsiness as bad as the other benzos.  We discussed benefits, risks and side effects and she accepts. D/C Xanax. Start Ativan 0.5 mg, 1/2-1 p.o. every 8 hours as needed.  Use sparingly. Continue Adderall XR 10 mg, 1 p.o. at breakfast and 1 p.o. at lunch. Continue Abilify 10 mg, 1 p.o. every morning. Continue Wellbutrin XL 300 mg, 1 p.o. daily. Continue gabapentin 100 mg, 1-3 daily as directed. Continue Effexor XR 150 mg, 2 p.o. every morning. Continue therapy. Return in 6 wks.  Donnal Moat, PA-C

## 2021-08-11 ENCOUNTER — Other Ambulatory Visit: Payer: Self-pay | Admitting: Family Medicine

## 2021-08-22 ENCOUNTER — Ambulatory Visit (INDEPENDENT_AMBULATORY_CARE_PROVIDER_SITE_OTHER): Payer: Medicare Other | Admitting: Physician Assistant

## 2021-08-22 ENCOUNTER — Other Ambulatory Visit: Payer: Self-pay

## 2021-08-22 ENCOUNTER — Encounter: Payer: Self-pay | Admitting: Physician Assistant

## 2021-08-22 VITALS — BP 100/64 | HR 77 | Temp 98.2°F | Ht 66.0 in | Wt 224.2 lb

## 2021-08-22 DIAGNOSIS — R0981 Nasal congestion: Secondary | ICD-10-CM

## 2021-08-22 DIAGNOSIS — M542 Cervicalgia: Secondary | ICD-10-CM | POA: Diagnosis not present

## 2021-08-22 MED ORDER — PREDNISONE 20 MG PO TABS: 40.0000 mg | ORAL_TABLET | Freq: Every day | ORAL | 0 refills | Status: DC

## 2021-08-22 MED ORDER — DOXYCYCLINE HYCLATE 100 MG PO TABS: 100.0000 mg | ORAL_TABLET | Freq: Two times a day (BID) | ORAL | 0 refills | Status: DC

## 2021-08-22 NOTE — Progress Notes (Signed)
Crystal Bean is a 45 y.o. female here for head and neck pain.  History of Present Illness:   Chief Complaint  Patient presents with   Neck Pain   Headache    HPI  Head and Neck Pain Crystal Bean presents with c/o constant neck pain and headache for a week. She reports the pain stems from the back of her neck and she feels pressure in her eyes as well as drainage down her throat and itchiness in her ears. Crystal Bean reports she started experiencing these symptoms shortly after receiving her flu vaccine. On the second day of feeling pain, she developed a subjective fever.   Crystal Bean has taken OTC medications, such as tylenol and excedrin migraine that have broken her fever and provided minor pain relief. Denies: changes in vision, double/blurred vision, confusion, slurred speech, urinary issues, numbness of one side of body, neck stiffness or recent injury.   Past Medical History:  Diagnosis Date   ADHD (attention deficit hyperactivity disorder)    Anxiety    B12 deficiency    Back pain    Bipolar 1 disorder (HCC)    Bulging lumbar disc    C. difficile diarrhea 04/24/2016   treated with metronidazole   Constipation    Depression    GERD (gastroesophageal reflux disease)    History of chicken pox    History of shingles 2013   Hypertension    IBS (irritable bowel syndrome)    Joint pain    Major depressive disorder    Migraines    Obesity    Obsessive-compulsive disorder    Osteoarthritis    Prediabetes    PTSD (post-traumatic stress disorder)    Sleep apnea    SOB (shortness of breath)      Social History   Tobacco Use   Smoking status: Former    Packs/day: 1.00    Years: 20.00    Pack years: 20.00    Types: Cigarettes    Quit date: 06/19/2011    Years since quitting: 10.1   Smokeless tobacco: Never  Vaping Use   Vaping Use: Never used  Substance Use Topics   Alcohol use: Not Currently   Drug use: No    Past Surgical History:  Procedure Laterality Date    BLADDER SURGERY  1981   Urethera stretched   caps on teeth  1980   Caps on all Teeth   CESAREAN SECTION  2003   TONSILLECTOMY     WISDOM TOOTH EXTRACTION      Family History  Problem Relation Age of Onset   Osteoporosis Mother    Depression Mother    Obesity Mother    Hearing loss Father    Obesity Father    Healthy Sister    CVA Maternal Grandmother    Heart disease Maternal Grandmother    Arthritis Paternal Grandmother    Hearing loss Paternal Grandmother    Dementia Paternal Grandmother    Dementia Paternal Grandfather    Eczema Daughter     Allergies  Allergen Reactions   Meloxicam Rash   Penicillins Rash    Current Medications:   Current Outpatient Medications:    [START ON 10/04/2021] amphetamine-dextroamphetamine (ADDERALL XR) 10 MG 24 hr capsule, Take 1 capsule (10 mg total) by mouth 2 (two) times daily with breakfast and lunch., Disp: 60 capsule, Rfl: 0   [START ON 09/04/2021] amphetamine-dextroamphetamine (ADDERALL XR) 10 MG 24 hr capsule, Take 1 capsule (10 mg total) by mouth 2 (two) times  daily with breakfast and lunch., Disp: 60 capsule, Rfl: 0   amphetamine-dextroamphetamine (ADDERALL XR) 10 MG 24 hr capsule, Take 1 capsule (10 mg total) by mouth 2 (two) times daily with breakfast and lunch., Disp: 60 capsule, Rfl: 0   ARIPiprazole (ABILIFY) 10 MG tablet, TAKE ONE TABLET BY MOUTH DAILY, Disp: 90 tablet, Rfl: 3   aspirin 81 MG chewable tablet, Chew by mouth daily., Disp: , Rfl:    buPROPion (WELLBUTRIN XL) 300 MG 24 hr tablet, TAKE ONE TABLET BY MOUTH DAILY, Disp: 90 tablet, Rfl: 0   Cholecalciferol (VITAMIN D3) 10 MCG (400 UNIT) CAPS, Take by mouth., Disp: , Rfl:    diclofenac (VOLTAREN) 75 MG EC tablet, TAKE ONE TABLET BY MOUTH TWICE A DAY **NEED APPT**, Disp: 60 tablet, Rfl: 0   doxycycline (VIBRA-TABS) 100 MG tablet, Take 1 tablet (100 mg total) by mouth 2 (two) times daily., Disp: 20 tablet, Rfl: 0   esomeprazole (NEXIUM) 20 MG capsule, Take 20 mg by  mouth daily at 12 noon., Disp: , Rfl:    gabapentin (NEURONTIN) 100 MG capsule, TAKE 1 CAPSULE(S) BY MOUTH DAILY . MAY INCRASE TO 3 CAPSULES DAILY AS TOLERATED, Disp: 90 capsule, Rfl: 0   LORazepam (ATIVAN) 0.5 MG tablet, Take 0.5-1 tablets (0.25-0.5 mg total) by mouth every 8 (eight) hours as needed for anxiety., Disp: 30 tablet, Rfl: 0   losartan (COZAAR) 25 MG tablet, Take 25 mg by mouth daily., Disp: , Rfl:    Magnesium 250 MG TABS, Take by mouth., Disp: , Rfl:    metFORMIN (GLUCOPHAGE-XR) 500 MG 24 hr tablet, TAKE TWO TABLETS BY MOUTH EVERY MORNING WITH BREAKFAST, Disp: 60 tablet, Rfl: 1   Multiple Vitamin (MULTIVITAMIN) tablet, Take 1 tablet by mouth daily., Disp: , Rfl:    predniSONE (DELTASONE) 20 MG tablet, Take 2 tablets (40 mg total) by mouth daily., Disp: 10 tablet, Rfl: 0   triamcinolone ointment (KENALOG) 0.5 %, Apply 1 application topically 2 (two) times daily., Disp: 30 g, Rfl: 1   venlafaxine XR (EFFEXOR-XR) 150 MG 24 hr capsule, TAKE TWO CAPSULES BY MOUTH EVERY MORNING WITH BREAKFAST, Disp: 180 capsule, Rfl: 3   Review of Systems:   ROS Negative unless otherwise specified per HPI.  Vitals:   Vitals:   08/22/21 1555  BP: 100/64  Pulse: 77  Temp: 98.2 F (36.8 C)  TempSrc: Temporal  SpO2: 97%  Weight: 224 lb 4 oz (101.7 kg)  Height: 5\' 6"  (1.676 m)     Body mass index is 36.19 kg/m.  Physical Exam:   Physical Exam Vitals and nursing note reviewed.  Constitutional:      General: She is not in acute distress.    Appearance: She is well-developed. She is not ill-appearing or toxic-appearing.  HENT:     Head: Normocephalic and atraumatic.     Right Ear: Ear canal and external ear normal. A middle ear effusion is present. Tympanic membrane is erythematous and bulging. Tympanic membrane is not retracted.     Left Ear: Tympanic membrane, ear canal and external ear normal. Tympanic membrane is not erythematous, retracted or bulging.     Nose:     Right Sinus:  Frontal sinus tenderness present. No maxillary sinus tenderness.     Left Sinus: Frontal sinus tenderness present. No maxillary sinus tenderness.     Mouth/Throat:     Pharynx: Uvula midline. No posterior oropharyngeal erythema.  Eyes:     General: Lids are normal.     Conjunctiva/sclera: Conjunctivae  normal.  Neck:     Trachea: Trachea normal.  Cardiovascular:     Rate and Rhythm: Normal rate and regular rhythm.     Pulses: Normal pulses.     Heart sounds: Normal heart sounds, S1 normal and S2 normal.  Pulmonary:     Effort: Pulmonary effort is normal.     Breath sounds: Normal breath sounds. No decreased breath sounds, wheezing, rhonchi or rales.  Musculoskeletal:     Cervical back: Full passive range of motion without pain. No rigidity.     Comments: No decreased ROM 2/2 pain with flexion/extension, lateral side bends, or rotation. Reproducible tenderness with deep palpation to right cervical paraspinal muscles.   Lymphadenopathy:     Cervical: Cervical adenopathy present.     Right cervical: Superficial cervical adenopathy present.  Skin:    General: Skin is warm and dry.  Neurological:     Mental Status: She is alert.     GCS: GCS eye subscore is 4. GCS verbal subscore is 5. GCS motor subscore is 6.  Psychiatric:        Speech: Speech normal.        Behavior: Behavior normal. Behavior is cooperative.    Assessment and Plan:   Sinus congestion No red flags on exam Suspect sinus infection with possible early R AOM Start oral doxycycline Push fluids Worsening precautions advised  Neck pain Suspect muscle spasm No red flags, no meningismus Start oral prednisone for inflammation (hold diclofenac while on this) If ongoing symptoms, will refer to PT If new symptoms, consider sports medicine referral vs imaging   I,Crystal Bean,acting as a scribe for [employer], PA.,have documented all relevant documentation on the behalf of Crystal Coke, PA,as directed by   Crystal Coke, PA while in the presence of Crystal Bean, [place].   I, Crystal Bean, [place], have reviewed all documentation for this visit. The documentation on 08/22/21 for the exam, diagnosis, procedures, and orders are all accurate and complete.   Crystal Coke, PA-C

## 2021-08-27 ENCOUNTER — Other Ambulatory Visit: Payer: Self-pay | Admitting: Family Medicine

## 2021-08-27 MED ORDER — DICLOFENAC SODIUM 75 MG PO TBEC: 75.0000 mg | DELAYED_RELEASE_TABLET | Freq: Two times a day (BID) | ORAL | 3 refills | Status: DC

## 2021-08-28 ENCOUNTER — Other Ambulatory Visit: Payer: Self-pay

## 2021-08-28 ENCOUNTER — Ambulatory Visit (INDEPENDENT_AMBULATORY_CARE_PROVIDER_SITE_OTHER): Payer: Medicare Other | Admitting: Family Medicine

## 2021-08-28 ENCOUNTER — Encounter (INDEPENDENT_AMBULATORY_CARE_PROVIDER_SITE_OTHER): Payer: Self-pay | Admitting: Family Medicine

## 2021-08-28 VITALS — BP 109/75 | HR 72 | Temp 97.5°F | Ht 66.0 in | Wt 219.0 lb

## 2021-08-28 DIAGNOSIS — R7303 Prediabetes: Secondary | ICD-10-CM | POA: Diagnosis not present

## 2021-08-28 DIAGNOSIS — I10 Essential (primary) hypertension: Secondary | ICD-10-CM

## 2021-08-28 DIAGNOSIS — Z6841 Body Mass Index (BMI) 40.0 and over, adult: Secondary | ICD-10-CM | POA: Diagnosis not present

## 2021-08-28 NOTE — Progress Notes (Signed)
Chief Complaint:   OBESITY Crystal Bean is here to discuss her progress with her obesity treatment plan along with follow-up of her obesity related diagnoses.   Today's visit was #: 62 Starting weight: 252 lbs Starting date: 01/02/2021 Today's weight: 219 lbs Today's date: 08/28/2021 Weight change since last visit: 33 lbs Total lbs lost to date: 5 lbs Body mass index is 35.35 kg/m.  Total weight loss percentage to date: -13.10%  Current Meal Plan: the Category 2 Plan for 85% of the time.  Current Exercise Plan: Cardio/swimming for for 45 minutes 3 times per week.  Interim History:  Crystal Bean is down 33 pounds today.  She is down 63 pounds total.  She is doing well and says she is happy with her progress.  Assessment/Plan:   1. Essential hypertension At goal. Medications: None.  She has been off of losartan for 1 week.  Doing well.  Plan: Avoid buying foods that are: processed, frozen, or prepackaged to avoid excess salt.   BP Readings from Last 3 Encounters:  08/28/21 109/75  08/22/21 100/64  08/01/21 108/66   Lab Results  Component Value Date   CREATININE 1.11 (H) 05/29/2021   2. Pre-diabetes At goal. Goal is HgbA1c < 5.7.  Medication: metformin XR 500 mg daily.    Plan:  Continue metformin.  She will continue to focus on protein-rich, low simple carbohydrate foods. We reviewed the importance of hydration, regular exercise for stress reduction, and restorative sleep.   Lab Results  Component Value Date   HGBA1C 5.7 (H) 05/29/2021   Lab Results  Component Value Date   INSULIN 7.7 05/29/2021   INSULIN 14.4 01/02/2021   3. Obesity, current BMI 35.4  Course: Crystal Bean is currently in the action stage of change. As such, her goal is to continue with weight loss efforts.   Nutrition goals: She has agreed to the Category 2 Plan.   Exercise goals:  As is.  Behavioral modification strategies: increasing lean protein intake, decreasing simple carbohydrates, increasing  vegetables, and increasing water intake.  Crystal Bean has agreed to follow-up with our clinic in 4 weeks. She was informed of the importance of frequent follow-up visits to maximize her success with intensive lifestyle modifications for her multiple health conditions.   Objective:   Blood pressure 109/75, pulse 72, temperature (!) 97.5 F (36.4 C), temperature source Oral, height 5\' 6"  (1.676 m), weight 219 lb (99.3 kg), last menstrual period 08/08/2021, SpO2 97 %. Body mass index is 35.35 kg/m.  General: Cooperative, alert, well developed, in no acute distress. HEENT: Conjunctivae and lids unremarkable. Cardiovascular: Regular rhythm.  Lungs: Normal work of breathing. Neurologic: No focal deficits.   Lab Results  Component Value Date   CREATININE 1.11 (H) 05/29/2021   BUN 13 05/29/2021   NA 139 05/29/2021   K 4.5 05/29/2021   CL 97 05/29/2021   CO2 24 05/29/2021   Lab Results  Component Value Date   ALT 14 05/29/2021   AST 13 05/29/2021   ALKPHOS 93 05/29/2021   BILITOT 0.3 05/29/2021   Lab Results  Component Value Date   HGBA1C 5.7 (H) 05/29/2021   HGBA1C 5.9 (H) 01/02/2021   HGBA1C 5.7 (H) 09/20/2020   HGBA1C 6.0 10/18/2019   HGBA1C 6.2 08/02/2019   Lab Results  Component Value Date   INSULIN 7.7 05/29/2021   INSULIN 14.4 01/02/2021   Lab Results  Component Value Date   TSH 2.410 01/02/2021   Lab Results  Component Value Date  CHOL 168 05/29/2021   HDL 35 (L) 05/29/2021   LDLCALC 114 (H) 05/29/2021   LDLDIRECT 111.0 10/18/2019   TRIG 102 05/29/2021   CHOLHDL 4.5 (H) 01/02/2021   Lab Results  Component Value Date   VD25OH 42.8 05/29/2021   VD25OH 42.2 01/02/2021   VD25OH 29.6 11/28/2015   Lab Results  Component Value Date   WBC 13.1 (H) 01/02/2021   HGB 13.5 01/02/2021   HCT 42.8 01/02/2021   MCV 88 01/02/2021   PLT 543 (H) 01/02/2021   Attestation Statements:   Reviewed by clinician on day of visit: allergies, medications, problem list, medical  history, surgical history, family history, social history, and previous encounter notes.  I, Water quality scientist, CMA, am acting as transcriptionist for Briscoe Deutscher, DO  I have reviewed the above documentation for accuracy and completeness, and I agree with the above. -  Briscoe Deutscher, DO, MS, FAAFP, DABOM - Family and Bariatric Medicine.

## 2021-09-14 ENCOUNTER — Other Ambulatory Visit: Payer: Self-pay

## 2021-09-14 ENCOUNTER — Encounter: Payer: Self-pay | Admitting: Physician Assistant

## 2021-09-14 ENCOUNTER — Ambulatory Visit (INDEPENDENT_AMBULATORY_CARE_PROVIDER_SITE_OTHER): Payer: Medicare Other | Admitting: Physician Assistant

## 2021-09-14 VITALS — BP 102/70 | HR 76 | Temp 98.3°F | Ht 66.0 in | Wt 219.0 lb

## 2021-09-14 DIAGNOSIS — R21 Rash and other nonspecific skin eruption: Secondary | ICD-10-CM | POA: Diagnosis not present

## 2021-09-14 MED ORDER — FLUCONAZOLE 150 MG PO TABS: ORAL_TABLET | ORAL | 0 refills | Status: DC

## 2021-09-14 MED ORDER — NYSTATIN 100000 UNIT/GM EX POWD: CUTANEOUS | 0 refills | Status: DC

## 2021-09-14 NOTE — Patient Instructions (Signed)
It was great to see you!  Stop triamcinolone ointment  -Start topical nystatin powder three times daily -Take 1 tablet of oral diflucan weekly x 4 weeks -Keep area as dry as possible  Read the attached handout about intertrigo as I think this is what you are dealing with   If you get blisters in your mouth or lips, please go to urgent care  Follow-up with me if any worsening symptoms  Take care,  Inda Coke PA-C

## 2021-09-14 NOTE — Progress Notes (Signed)
Crystal Bean is a 45 y.o. female here for a rash.  History of Present Illness:   Chief Complaint  Patient presents with   Rash    Pt c/o rash under abdominal fold, is oozing. Also both groins and under left axilla.    HPI  Rash Crystal Bean presents with c/o a rash under her abdominal fold, both groin area, and left breast. The rash has been present for three weeks and is currently oozing fluid. She was using nystatin and triamcinolone acetonide ointment to treat the rash but has been unsuccessful. She has not used anything else or made any changes to her current medications.  Denies fever or chills.    Past Medical History:  Diagnosis Date   ADHD (attention deficit hyperactivity disorder)    Anxiety    B12 deficiency    Back pain    Bipolar 1 disorder (HCC)    Bulging lumbar disc    C. difficile diarrhea 04/24/2016   treated with metronidazole   Constipation    Depression    GERD (gastroesophageal reflux disease)    History of chicken pox    History of shingles 2013   Hypertension    IBS (irritable bowel syndrome)    Joint pain    Major depressive disorder    Migraines    Obesity    Obsessive-compulsive disorder    Osteoarthritis    Prediabetes    PTSD (post-traumatic stress disorder)    Sleep apnea    SOB (shortness of breath)      Social History   Tobacco Use   Smoking status: Former    Packs/day: 1.00    Years: 20.00    Pack years: 20.00    Types: Cigarettes    Quit date: 06/19/2011    Years since quitting: 10.2   Smokeless tobacco: Never  Vaping Use   Vaping Use: Never used  Substance Use Topics   Alcohol use: Not Currently   Drug use: No    Past Surgical History:  Procedure Laterality Date   BLADDER SURGERY  1981   Urethera stretched   caps on teeth  1980   Caps on all Teeth   CESAREAN SECTION  2003   TONSILLECTOMY     WISDOM TOOTH EXTRACTION      Family History  Problem Relation Age of Onset   Osteoporosis Mother    Depression Mother     Obesity Mother    Hearing loss Father    Obesity Father    Healthy Sister    CVA Maternal Grandmother    Heart disease Maternal Grandmother    Arthritis Paternal Grandmother    Hearing loss Paternal Grandmother    Dementia Paternal Grandmother    Dementia Paternal Grandfather    Eczema Daughter     Allergies  Allergen Reactions   Prednisone Other (See Comments)    Severe mood swings   Meloxicam Rash   Penicillins Rash    Current Medications:   Current Outpatient Medications:    [START ON 10/04/2021] amphetamine-dextroamphetamine (ADDERALL XR) 10 MG 24 hr capsule, Take 1 capsule (10 mg total) by mouth 2 (two) times daily with breakfast and lunch., Disp: 60 capsule, Rfl: 0   amphetamine-dextroamphetamine (ADDERALL XR) 10 MG 24 hr capsule, Take 1 capsule (10 mg total) by mouth 2 (two) times daily with breakfast and lunch., Disp: 60 capsule, Rfl: 0   amphetamine-dextroamphetamine (ADDERALL XR) 10 MG 24 hr capsule, Take 1 capsule (10 mg total) by mouth 2 (two)  times daily with breakfast and lunch., Disp: 60 capsule, Rfl: 0   ARIPiprazole (ABILIFY) 10 MG tablet, TAKE ONE TABLET BY MOUTH DAILY, Disp: 90 tablet, Rfl: 3   aspirin 81 MG chewable tablet, Chew by mouth daily., Disp: , Rfl:    buPROPion (WELLBUTRIN XL) 300 MG 24 hr tablet, TAKE ONE TABLET BY MOUTH DAILY, Disp: 90 tablet, Rfl: 0   Cholecalciferol (VITAMIN D3) 10 MCG (400 UNIT) CAPS, Take by mouth., Disp: , Rfl:    diclofenac (VOLTAREN) 75 MG EC tablet, Take 1 tablet (75 mg total) by mouth 2 (two) times daily., Disp: 60 tablet, Rfl: 3   esomeprazole (NEXIUM) 20 MG capsule, Take 20 mg by mouth daily at 12 noon., Disp: , Rfl:    gabapentin (NEURONTIN) 100 MG capsule, TAKE 1 CAPSULE(S) BY MOUTH DAILY . MAY INCRASE TO 3 CAPSULES DAILY AS TOLERATED (Patient taking differently: TAKE 1 CAPSULE(S) BY MOUTH DAILY), Disp: 90 capsule, Rfl: 0   LORazepam (ATIVAN) 0.5 MG tablet, Take 0.5-1 tablets (0.25-0.5 mg total) by mouth every 8  (eight) hours as needed for anxiety., Disp: 30 tablet, Rfl: 0   Magnesium 250 MG TABS, Take by mouth., Disp: , Rfl:    metFORMIN (GLUCOPHAGE-XR) 500 MG 24 hr tablet, TAKE TWO TABLETS BY MOUTH EVERY MORNING WITH BREAKFAST, Disp: 60 tablet, Rfl: 1   Multiple Vitamin (MULTIVITAMIN) tablet, Take 1 tablet by mouth daily., Disp: , Rfl:    triamcinolone ointment (KENALOG) 0.5 %, Apply 1 application topically 2 (two) times daily., Disp: 30 g, Rfl: 1   venlafaxine XR (EFFEXOR-XR) 150 MG 24 hr capsule, TAKE TWO CAPSULES BY MOUTH EVERY MORNING WITH BREAKFAST, Disp: 180 capsule, Rfl: 3   Review of Systems:   ROS Negative unless otherwise specified per HPI. Vitals:   Vitals:   09/14/21 1606  BP: 102/70  Pulse: 76  Temp: 98.3 F (36.8 C)  TempSrc: Temporal  SpO2: 96%  Weight: 219 lb (99.3 kg)  Height: 5\' 6"  (1.676 m)     Body mass index is 35.35 kg/m.  Physical Exam:   Physical Exam Constitutional:      Appearance: Normal appearance. She is well-developed.  HENT:     Head: Normocephalic and atraumatic.  Eyes:     General: Lids are normal.     Extraocular Movements: Extraocular movements intact.     Conjunctiva/sclera: Conjunctivae normal.  Pulmonary:     Effort: Pulmonary effort is normal.  Musculoskeletal:        General: Normal range of motion.     Cervical back: Normal range of motion and neck supple.  Skin:    General: Skin is warm and moist.     Findings: Erythema and rash present.     Comments: Macerated erythematous area under left breast, right groin, and pannus Negative nikolsky sign  Neurological:     Mental Status: She is alert and oriented to person, place, and time.  Psychiatric:        Attention and Perception: Attention and perception normal.        Mood and Affect: Mood normal.        Behavior: Behavior normal.        Thought Content: Thought content normal.        Judgment: Judgment normal.    Assessment and Plan:   Rash No systemic symptoms of  illness/infection Suspect intertrigo Stop triamcinolone Start nysatin powder -- use 3 times daily Keep area dry Start weekly diflucan x 4 weeks If signs/sx of infection, she  was told to follow-up with [place] in our office  I,Havlyn C Ratchford,acting as a scribe for Inda Coke, PA.,have documented all relevant documentation on the behalf of Inda Coke, PA,as directed by  Inda Coke, PA while in the presence of Inda Coke, [place].   I, Inda Coke, [place], have reviewed all documentation for this visit. The documentation on 09/14/21 for the exam, diagnosis, procedures, and orders are all accurate and complete.   Inda Coke, PA-C

## 2021-09-17 ENCOUNTER — Ambulatory Visit: Payer: Medicare Other | Admitting: Physician Assistant

## 2021-09-17 ENCOUNTER — Other Ambulatory Visit: Payer: Self-pay | Admitting: Physician Assistant

## 2021-09-20 ENCOUNTER — Telehealth: Payer: Self-pay

## 2021-09-20 NOTE — Telephone Encounter (Signed)
Pt called and stated she saw Samantha on 10/28 for a rash. Pt now believes that the rash is worse and wants to know what she should do. Please Advise.

## 2021-09-21 ENCOUNTER — Other Ambulatory Visit: Payer: Self-pay

## 2021-09-21 ENCOUNTER — Encounter: Payer: Self-pay | Admitting: Physician Assistant

## 2021-09-21 ENCOUNTER — Other Ambulatory Visit: Payer: Self-pay | Admitting: Physician Assistant

## 2021-09-21 ENCOUNTER — Ambulatory Visit (INDEPENDENT_AMBULATORY_CARE_PROVIDER_SITE_OTHER): Payer: Medicare Other | Admitting: Physician Assistant

## 2021-09-21 DIAGNOSIS — R21 Rash and other nonspecific skin eruption: Secondary | ICD-10-CM | POA: Diagnosis not present

## 2021-09-21 MED ORDER — DOXYCYCLINE HYCLATE 100 MG PO TABS: 100.0000 mg | ORAL_TABLET | Freq: Two times a day (BID) | ORAL | 0 refills | Status: DC

## 2021-09-21 MED ORDER — TRIAMCINOLONE ACETONIDE 0.5 % EX OINT: TOPICAL_OINTMENT | CUTANEOUS | 0 refills | Status: DC

## 2021-09-21 MED ORDER — KETOCONAZOLE 2 % EX CREA: TOPICAL_CREAM | CUTANEOUS | 0 refills | Status: DC

## 2021-09-21 NOTE — Telephone Encounter (Signed)
Please see message and advise 

## 2021-09-21 NOTE — Telephone Encounter (Signed)
Spoke to pt told her Aldona Bar said needs to either schedule with [place] today or get in to see your dermatologist ASAP. Pt verbalized understanding. Asked pt if she called her dermatologist? Pt said no. Told her I have an opening today at 4:00 I can put you on the schedule to save the spot but you need to call your dermatologist and see if they can see you and then let me know. Pt verbalized understanding.

## 2021-09-21 NOTE — Progress Notes (Signed)
Crystal Bean is a 45 y.o. female here for a recurrence of a previously resolved rash.    History of Present Illness:   Chief Complaint  Patient presents with   Rash    Follow-up     HPI  Rash Crystal Bean presents today with a worsening rash that she was previously treated by me on 09/14/21. According to [facility] the rash was improving upon using the nystatin powder but recently became worse. Upon further investigation she noticed that it appears more swollen and painful when it comes to walking due to friction. Currently it is not itching.    She also reports noticing a mosquito bite on the front of her right ankle that appears to be red and blistered. States its been present for 3-4 weeks and hasn't fulled healed but she has applied antibiotic topical cream.   Denies: Fever, chills, malaise    Past Medical History:  Diagnosis Date   ADHD (attention deficit hyperactivity disorder)    Anxiety    B12 deficiency    Back pain    Bipolar 1 disorder ([facility])    Bulging lumbar disc    C. difficile diarrhea 04/24/2016   treated with metronidazole   Constipation    Depression    GERD (gastroesophageal reflux disease)    History of chicken pox    History of shingles 2013   Hypertension    IBS (irritable bowel syndrome)    Joint pain    Major depressive disorder    Migraines    Obesity    Obsessive-compulsive disorder    Osteoarthritis    Prediabetes    PTSD (post-traumatic stress disorder)    Sleep apnea    SOB (shortness of breath)      Social History   Tobacco Use   Smoking status: Former    Packs/day: 1.00    Years: 20.00    Pack years: 20.00    Types: Cigarettes    Quit date: 06/19/2011    Years since quitting: 10.2   Smokeless tobacco: Never  Vaping Use   Vaping Use: Never used  Substance Use Topics   Alcohol use: Not Currently   Drug use: No    Past Surgical History:  Procedure Laterality Date   BLADDER SURGERY  1981   Urethera stretched   caps on teeth  1980    Caps on all Teeth   CESAREAN SECTION  2003   TONSILLECTOMY     WISDOM TOOTH EXTRACTION      Family History  Problem Relation Age of Onset   Osteoporosis Mother    Depression Mother    Obesity Mother    Hearing loss Father    Obesity Father    Healthy Sister    CVA Maternal Grandmother    Heart disease Maternal Grandmother    Arthritis Paternal Grandmother    Hearing loss Paternal Grandmother    Dementia Paternal Grandmother    Dementia Paternal Grandfather    Eczema Daughter     Allergies  Allergen Reactions   Prednisone Other (See Comments)    Severe mood swings   Meloxicam Rash   Penicillins Rash    Current Medications:   Current Outpatient Medications:    doxycycline (VIBRA-TABS) 100 MG tablet, Take 1 tablet (100 mg total) by mouth 2 (two) times daily., Disp: 20 tablet, Rfl: 0   ketoconazole (NIZORAL) 2 % cream, Apply to affected area 1-2 times daily, Disp: 15 g, Rfl: 0   triamcinolone ointment (KENALOG) 0.5 %, Apply  to affected area 1-2 times daily, Disp: 15 g, Rfl: 0   [START ON 10/04/2021] amphetamine-dextroamphetamine (ADDERALL XR) 10 MG 24 hr capsule, Take 1 capsule (10 mg total) by mouth 2 (two) times daily with breakfast and lunch., Disp: 60 capsule, Rfl: 0   amphetamine-dextroamphetamine (ADDERALL XR) 10 MG 24 hr capsule, Take 1 capsule (10 mg total) by mouth 2 (two) times daily with breakfast and lunch., Disp: 60 capsule, Rfl: 0   amphetamine-dextroamphetamine (ADDERALL XR) 10 MG 24 hr capsule, Take 1 capsule (10 mg total) by mouth 2 (two) times daily with breakfast and lunch., Disp: 60 capsule, Rfl: 0   ARIPiprazole (ABILIFY) 10 MG tablet, TAKE ONE TABLET BY MOUTH DAILY, Disp: 90 tablet, Rfl: 3   aspirin 81 MG chewable tablet, Chew by mouth daily., Disp: , Rfl:    buPROPion (WELLBUTRIN XL) 300 MG 24 hr tablet, TAKE ONE TABLET BY MOUTH DAILY, Disp: 30 tablet, Rfl: 0   Cholecalciferol (VITAMIN D3) 10 MCG (400 UNIT) CAPS, Take by mouth., Disp: , Rfl:     diclofenac (VOLTAREN) 75 MG EC tablet, Take 1 tablet (75 mg total) by mouth 2 (two) times daily., Disp: 60 tablet, Rfl: 3   esomeprazole (NEXIUM) 20 MG capsule, Take 20 mg by mouth daily at 12 noon., Disp: , Rfl:    fluconazole (DIFLUCAN) 150 MG tablet, Take 1 tablet weekly x 4 weeks, Disp: 4 tablet, Rfl: 0   gabapentin (NEURONTIN) 100 MG capsule, TAKE 1 CAPSULE(S) BY MOUTH DAILY . MAY INCRASE TO 3 CAPSULES DAILY AS TOLERATED (Patient taking differently: TAKE 1 CAPSULE(S) BY MOUTH DAILY), Disp: 90 capsule, Rfl: 0   LORazepam (ATIVAN) 0.5 MG tablet, Take 0.5-1 tablets (0.25-0.5 mg total) by mouth every 8 (eight) hours as needed for anxiety., Disp: 30 tablet, Rfl: 0   Magnesium 250 MG TABS, Take by mouth., Disp: , Rfl:    metFORMIN (GLUCOPHAGE-XR) 500 MG 24 hr tablet, TAKE TWO TABLETS BY MOUTH EVERY MORNING WITH BREAKFAST, Disp: 60 tablet, Rfl: 1   Multiple Vitamin (MULTIVITAMIN) tablet, Take 1 tablet by mouth daily., Disp: , Rfl:    nystatin (MYCOSTATIN/NYSTOP) powder, Apply to affected area 1-2 times daily, Disp: 60 g, Rfl: 0   venlafaxine XR (EFFEXOR-XR) 150 MG 24 hr capsule, TAKE TWO CAPSULES BY MOUTH EVERY MORNING WITH BREAKFAST, Disp: 180 capsule, Rfl: 3   Review of Systems:   ROS Negative unless otherwise specified per HPI. Vitals:   There were no vitals filed for this visit.   There is no height or weight on file to calculate BMI.  Physical Exam:   Physical Exam Constitutional:      Appearance: Normal appearance. She is well-developed.  HENT:     Head: Normocephalic and atraumatic.  Eyes:     General: Lids are normal.     Extraocular Movements: Extraocular movements intact.     Conjunctiva/sclera: Conjunctivae normal.  Pulmonary:     Effort: Pulmonary effort is normal.  Musculoskeletal:        General: Normal range of motion.     Cervical back: Normal range of motion and neck supple.  Skin:    General: Skin is warm and moist.     Findings: Erythema, rash and wound present.           Comments:  Macerated erythematous area under left breast, right groin, and pannus Negative nikolsky sign  Slightly worsened from last visit on 09/14/21.   Neurological:     Mental Status: She is alert and oriented to  person, place, and time.  Psychiatric:        Attention and Perception: Attention and perception normal.        Mood and Affect: Mood normal.        Behavior: Behavior normal.        Thought Content: Thought content normal.        Judgment: Judgment normal.    Assessment and Plan:   Rash Slightly worsening Patient also briefly evaluated by Dr. Dimas Chyle Start oral doxycycline Instructed patient as follows: Recommend a 50-50 mix of the triamcinolone and ketoconazole ointment.  Apply to area 1-2 times daily. If any worsening symptoms, please go to urgent care or ER over the weekend.     I,Havlyn C Ratchford,acting as a Education administrator for [employer], PA.,have documented all relevant documentation on the behalf of Inda Coke, PA,as directed by  Inda Coke, PA while in the presence of Inda Coke, [place].   I, Inda Coke, [place], have reviewed all documentation for this visit. The documentation on 09/21/21 for the exam, diagnosis, procedures, and orders are all accurate and complete.   Inda Coke, PA-C

## 2021-09-25 ENCOUNTER — Encounter (INDEPENDENT_AMBULATORY_CARE_PROVIDER_SITE_OTHER): Payer: Self-pay | Admitting: Family Medicine

## 2021-09-25 ENCOUNTER — Other Ambulatory Visit: Payer: Self-pay

## 2021-09-25 ENCOUNTER — Ambulatory Visit (INDEPENDENT_AMBULATORY_CARE_PROVIDER_SITE_OTHER): Payer: Medicare Other | Admitting: Family Medicine

## 2021-09-25 VITALS — BP 107/73 | HR 72 | Temp 98.2°F | Ht 66.0 in | Wt 215.0 lb

## 2021-09-25 DIAGNOSIS — L304 Erythema intertrigo: Secondary | ICD-10-CM

## 2021-09-25 DIAGNOSIS — E785 Hyperlipidemia, unspecified: Secondary | ICD-10-CM | POA: Diagnosis not present

## 2021-09-25 DIAGNOSIS — R7303 Prediabetes: Secondary | ICD-10-CM | POA: Diagnosis not present

## 2021-09-25 DIAGNOSIS — Z6841 Body Mass Index (BMI) 40.0 and over, adult: Secondary | ICD-10-CM

## 2021-09-25 NOTE — Progress Notes (Signed)
Chief Complaint:   OBESITY Crystal Bean is here to discuss her progress with her obesity treatment plan along with follow-up of her obesity related diagnoses. See Medical Weight Management Flowsheet for complete bioelectrical impedance results.  Today's visit was #: 14 Starting weight: 252 lbs Starting date: 01/02/2021 Weight change since last visit: 4 lbs Total lbs lost to date: 37 lbs Total weight loss percentage to date: -14.68%  Nutrition Plan: Category 2 Meal Plan for 80% of the time.  Activity: None.  Interim History: Crystal Bean is down 37 pounds/67 pounds total. She says she has a rash in her groin area that is now improving; consistent with intertrigo with bacterial superinfection. She plans to get back to the gym once all better.   Assessment/Plan:   1. Pre-diabetes At goal. Goal is HgbA1c < 5.7.  Medication: metformin XR 1,000 mg every morning.    Plan: She will continue to focus on protein-rich, low simple carbohydrate foods. We reviewed the importance of hydration, regular exercise for stress reduction, and restorative sleep.   Lab Results  Component Value Date   HGBA1C 5.7 (H) 05/29/2021   Lab Results  Component Value Date   INSULIN 7.7 05/29/2021   INSULIN 14.4 01/02/2021   2. Dyslipidemia Course:  Improving.  Medications: None.  Low HDL, elevated LDL.    Lab Results  Component Value Date   CHOL 168 05/29/2021   HDL 35 (L) 05/29/2021   LDLCALC 114 (H) 05/29/2021   LDLDIRECT 111.0 10/18/2019   TRIG 102 05/29/2021   CHOLHDL 4.5 (H) 01/02/2021   Lab Results  Component Value Date   ALT 14 05/29/2021   AST 13 05/29/2021   ALKPHOS 93 05/29/2021   BILITOT 0.3 05/29/2021   The 10-year ASCVD risk score (Arnett DK, et al., 2019) is: 0.9%   Values used to calculate the score:     Age: 45 years     Sex: Female     Is Non-Hispanic African American: No     Diabetic: No     Tobacco smoker: No     Systolic Blood Pressure: 329 mmHg     Is BP treated: No     HDL  Cholesterol: 35 mg/dL     Total Cholesterol: 168 mg/dL  3. Intertrigo Improving with daily treatment. Will make sure that PCP knows that it is improving.   4. Obesity BMI today 34  Course: Crystal Bean is currently in the action stage of change. As such, her goal is to continue with weight loss efforts.   Nutrition goals: She has agreed to the Category 2 Plan.   Exercise goals:  As tolerated.  Behavioral modification strategies: increasing lean protein intake, decreasing simple carbohydrates, increasing vegetables, increasing water intake, and holiday eating strategies .  Crystal Bean has agreed to follow-up with our clinic in 4 weeks. She was informed of the importance of frequent follow-up visits to maximize her success with intensive lifestyle modifications for her multiple health conditions.   Objective:   Blood pressure 107/73, pulse 72, temperature 98.2 F (36.8 C), temperature source Oral, height 5\' 6"  (1.676 m), weight 215 lb (97.5 kg), SpO2 97 %. Body mass index is 34.7 kg/m.  General: Cooperative, alert, well developed, in no acute distress. HEENT: Conjunctivae and lids unremarkable. Cardiovascular: Regular rhythm.  Lungs: Normal work of breathing. Neurologic: No focal deficits.   Lab Results  Component Value Date   CREATININE 1.11 (H) 05/29/2021   BUN 13 05/29/2021   NA 139 05/29/2021   K  4.5 05/29/2021   CL 97 05/29/2021   CO2 24 05/29/2021   Lab Results  Component Value Date   ALT 14 05/29/2021   AST 13 05/29/2021   ALKPHOS 93 05/29/2021   BILITOT 0.3 05/29/2021   Lab Results  Component Value Date   HGBA1C 5.7 (H) 05/29/2021   HGBA1C 5.9 (H) 01/02/2021   HGBA1C 5.7 (H) 09/20/2020   HGBA1C 6.0 10/18/2019   HGBA1C 6.2 08/02/2019   Lab Results  Component Value Date   INSULIN 7.7 05/29/2021   INSULIN 14.4 01/02/2021   Lab Results  Component Value Date   TSH 2.410 01/02/2021   Lab Results  Component Value Date   CHOL 168 05/29/2021   HDL 35 (L) 05/29/2021    LDLCALC 114 (H) 05/29/2021   LDLDIRECT 111.0 10/18/2019   TRIG 102 05/29/2021   CHOLHDL 4.5 (H) 01/02/2021   Lab Results  Component Value Date   VD25OH 42.8 05/29/2021   VD25OH 42.2 01/02/2021   VD25OH 29.6 11/28/2015   Lab Results  Component Value Date   WBC 13.1 (H) 01/02/2021   HGB 13.5 01/02/2021   HCT 42.8 01/02/2021   MCV 88 01/02/2021   PLT 543 (H) 01/02/2021   Attestation Statements:   Reviewed by clinician on day of visit: allergies, medications, problem list, medical history, surgical history, family history, social history, and previous encounter notes.  I, Water quality scientist, CMA, am acting as transcriptionist for Briscoe Deutscher, DO  I have reviewed the above documentation for accuracy and completeness, and I agree with the above. -  Briscoe Deutscher, DO, MS, FAAFP, DABOM - Family and Bariatric Medicine.

## 2021-10-01 ENCOUNTER — Other Ambulatory Visit: Payer: Self-pay | Admitting: *Deleted

## 2021-10-01 MED ORDER — METFORMIN HCL ER 500 MG PO TB24: 1000.0000 mg | ORAL_TABLET | Freq: Every day | ORAL | 2 refills | Status: DC

## 2021-10-05 ENCOUNTER — Other Ambulatory Visit: Payer: Self-pay

## 2021-10-05 ENCOUNTER — Encounter: Payer: Self-pay | Admitting: Physician Assistant

## 2021-10-05 ENCOUNTER — Ambulatory Visit (INDEPENDENT_AMBULATORY_CARE_PROVIDER_SITE_OTHER): Payer: Medicare Other | Admitting: Physician Assistant

## 2021-10-05 VITALS — BP 110/76 | HR 85 | Temp 97.7°F | Ht 66.0 in | Wt 215.4 lb

## 2021-10-05 DIAGNOSIS — L0291 Cutaneous abscess, unspecified: Secondary | ICD-10-CM

## 2021-10-05 MED ORDER — CEPHALEXIN 500 MG PO CAPS: 500.0000 mg | ORAL_CAPSULE | Freq: Four times a day (QID) | ORAL | 0 refills | Status: DC

## 2021-10-05 NOTE — Progress Notes (Signed)
Crystal Bean is a 45 y.o. female here for a recurrence of a previously resolved rash.   History of Present Illness:   Chief Complaint  Patient presents with   Wound Check    Right lower extremity wound is red and draining yellow drainage.    HPI  Wound Presents with worsening LE wound on R leg. We first evaluated this 09/21/21. We started doxycycline for another rash that has improved with time. Currently Crystal Bean reports she has been compliant with taking doxycycline with no adverse effects. Finished this 3-4 days ago and the wound on her RLE has slightly worsened. Has slight purulent discharge with surrounding redness.  Denies fever, chills, nausea, malaise.   Past Medical History:  Diagnosis Date   ADHD (attention deficit hyperactivity disorder)    Anxiety    B12 deficiency    Back pain    Bipolar 1 disorder (HCC)    Bulging lumbar disc    C. difficile diarrhea 04/24/2016   treated with metronidazole   Constipation    Depression    GERD (gastroesophageal reflux disease)    History of chicken pox    History of shingles 2013   Hypertension    IBS (irritable bowel syndrome)    Joint pain    Major depressive disorder    Migraines    Obesity    Obsessive-compulsive disorder    Osteoarthritis    Prediabetes    PTSD (post-traumatic stress disorder)    Sleep apnea    SOB (shortness of breath)      Social History   Tobacco Use   Smoking status: Former    Packs/day: 1.00    Years: 20.00    Pack years: 20.00    Types: Cigarettes    Quit date: 06/19/2011    Years since quitting: 10.3   Smokeless tobacco: Never  Vaping Use   Vaping Use: Never used  Substance Use Topics   Alcohol use: Not Currently   Drug use: No    Past Surgical History:  Procedure Laterality Date   BLADDER SURGERY  1981   Urethera stretched   caps on teeth  1980   Caps on all Teeth   CESAREAN SECTION  2003   TONSILLECTOMY     WISDOM TOOTH EXTRACTION      Family History  Problem Relation  Age of Onset   Osteoporosis Mother    Depression Mother    Obesity Mother    Hearing loss Father    Obesity Father    Healthy Sister    CVA Maternal Grandmother    Heart disease Maternal Grandmother    Arthritis Paternal Grandmother    Hearing loss Paternal Grandmother    Dementia Paternal Grandmother    Dementia Paternal Grandfather    Eczema Daughter     Allergies  Allergen Reactions   Prednisone Other (See Comments)    Severe mood swings   Meloxicam Rash   Penicillins Rash    Current Medications:   Current Outpatient Medications:    amphetamine-dextroamphetamine (ADDERALL XR) 10 MG 24 hr capsule, Take 1 capsule (10 mg total) by mouth 2 (two) times daily with breakfast and lunch., Disp: 60 capsule, Rfl: 0   amphetamine-dextroamphetamine (ADDERALL XR) 10 MG 24 hr capsule, Take 1 capsule (10 mg total) by mouth 2 (two) times daily with breakfast and lunch., Disp: 60 capsule, Rfl: 0   amphetamine-dextroamphetamine (ADDERALL XR) 10 MG 24 hr capsule, Take 1 capsule (10 mg total) by mouth 2 (two) times daily  with breakfast and lunch., Disp: 60 capsule, Rfl: 0   ARIPiprazole (ABILIFY) 10 MG tablet, TAKE ONE TABLET BY MOUTH DAILY, Disp: 90 tablet, Rfl: 3   aspirin 81 MG chewable tablet, Chew by mouth daily., Disp: , Rfl:    buPROPion (WELLBUTRIN XL) 300 MG 24 hr tablet, TAKE ONE TABLET BY MOUTH DAILY, Disp: 30 tablet, Rfl: 0   Cholecalciferol (VITAMIN D3) 10 MCG (400 UNIT) CAPS, Take by mouth., Disp: , Rfl:    diclofenac (VOLTAREN) 75 MG EC tablet, Take 1 tablet (75 mg total) by mouth 2 (two) times daily., Disp: 60 tablet, Rfl: 3   esomeprazole (NEXIUM) 20 MG capsule, Take 20 mg by mouth daily at 12 noon., Disp: , Rfl:    gabapentin (NEURONTIN) 100 MG capsule, TAKE 1 CAPSULE(S) BY MOUTH DAILY . MAY INCRASE TO 3 CAPSULES DAILY AS TOLERATED (Patient taking differently: TAKE 1 CAPSULE(S) BY MOUTH DAILY), Disp: 90 capsule, Rfl: 0   ketoconazole (NIZORAL) 2 % cream, Apply to affected area  1-2 times daily, Disp: 15 g, Rfl: 0   LORazepam (ATIVAN) 0.5 MG tablet, Take 0.5-1 tablets (0.25-0.5 mg total) by mouth every 8 (eight) hours as needed for anxiety., Disp: 30 tablet, Rfl: 0   Magnesium 250 MG TABS, Take by mouth., Disp: , Rfl:    metFORMIN (GLUCOPHAGE-XR) 500 MG 24 hr tablet, Take 2 tablets (1,000 mg total) by mouth daily with breakfast., Disp: 60 tablet, Rfl: 2   Multiple Vitamin (MULTIVITAMIN) tablet, Take 1 tablet by mouth daily., Disp: , Rfl:    triamcinolone ointment (KENALOG) 0.5 %, Apply to affected area 1-2 times daily, Disp: 15 g, Rfl: 0   venlafaxine XR (EFFEXOR-XR) 150 MG 24 hr capsule, TAKE TWO CAPSULES BY MOUTH EVERY MORNING WITH BREAKFAST, Disp: 180 capsule, Rfl: 3   Review of Systems:   ROS Negative unless otherwise specified per HPI. Vitals:   Vitals:   10/05/21 1301  BP: 110/76  Pulse: 85  Temp: 97.7 F (36.5 C)  TempSrc: Temporal  SpO2: 96%  Weight: 215 lb 6.1 oz (97.7 kg)  Height: 5\' 6"  (1.676 m)     Body mass index is 34.76 kg/m.  Physical Exam:   Physical Exam Constitutional:      Appearance: Normal appearance. She is well-developed.  HENT:     Head: Normocephalic and atraumatic.  Eyes:     General: Lids are normal.     Extraocular Movements: Extraocular movements intact.     Conjunctiva/sclera: Conjunctivae normal.  Pulmonary:     Effort: Pulmonary effort is normal.  Musculoskeletal:        General: Normal range of motion.     Cervical back: Normal range of motion and neck supple.  Skin:    General: Skin is warm and dry.     Comments: Approx 1 cm circular lesion to anterior aspect of RLE with serosanguinous discharge and surrounding erythema; TTP  Neurological:     Mental Status: She is alert and oriented to person, place, and time.  Psychiatric:        Attention and Perception: Attention and perception normal.        Mood and Affect: Mood normal.        Behavior: Behavior normal.        Thought Content: Thought content  normal.        Judgment: Judgment normal.       Assessment and Plan:   Abscess No red flags on exam or hx suggesting systemic infection Area was marked  with skin marker today Trial keflex 500 mg QID x 7 days (does have PCN allergy with rash -- cautioned her about possible cross-reactivity of cephalosporin -- she verbalized understanding and consents to proceed) If new/worsening or lack of improvement, she was told to follow-up or go to ER Area was marked with skin marker today  I,Havlyn C Ratchford,acting as a scribe for [employer], PA.,have documented all relevant documentation on the behalf of Inda Coke, PA,as directed by  Inda Coke, PA while in the presence of Inda Coke, [place].  I, Inda Coke, [place], have reviewed all documentation for this visit. The documentation on 10/05/21 for the exam, diagnosis, procedures, and orders are all accurate and complete.   Inda Coke, PA-C

## 2021-10-05 NOTE — Patient Instructions (Signed)
It was great to see you!  Start keflex four times daily  If worsening, let us know or go to urgent care (over weekend)  Take care,  Inda Coke PA-C

## 2021-10-17 ENCOUNTER — Other Ambulatory Visit: Payer: Self-pay | Admitting: Physician Assistant

## 2021-10-26 ENCOUNTER — Ambulatory Visit: Payer: Medicare Other | Admitting: Physician Assistant

## 2021-10-29 ENCOUNTER — Other Ambulatory Visit: Payer: Self-pay

## 2021-10-29 ENCOUNTER — Encounter (INDEPENDENT_AMBULATORY_CARE_PROVIDER_SITE_OTHER): Payer: Self-pay | Admitting: Family Medicine

## 2021-10-29 ENCOUNTER — Ambulatory Visit (INDEPENDENT_AMBULATORY_CARE_PROVIDER_SITE_OTHER): Payer: Medicare Other | Admitting: Family Medicine

## 2021-10-29 VITALS — BP 108/73 | HR 71 | Temp 98.1°F | Ht 66.0 in | Wt 210.0 lb

## 2021-10-29 DIAGNOSIS — I10 Essential (primary) hypertension: Secondary | ICD-10-CM

## 2021-10-29 DIAGNOSIS — R7303 Prediabetes: Secondary | ICD-10-CM

## 2021-10-29 DIAGNOSIS — E782 Mixed hyperlipidemia: Secondary | ICD-10-CM

## 2021-10-29 DIAGNOSIS — Z6841 Body Mass Index (BMI) 40.0 and over, adult: Secondary | ICD-10-CM

## 2021-10-30 NOTE — Progress Notes (Signed)
Chief Complaint:   OBESITY Crystal Bean is here to discuss her progress with her obesity treatment plan along with follow-up of her obesity related diagnoses. See Medical Weight Management Flowsheet for complete bioelectrical impedance results.  Today's visit was #: 15 Starting weight: 252 lbs Starting date: 01/02/2021 Weight change since last visit: 5 lbs Total lbs lost to date: 42 lbs Total weight loss percentage to date: -16.67%  Nutrition Plan: Category 2 Plan for 75% of the time.  Activity: None.  Interim History: [name] says she is going to restart exercise today!  The wound to her right lower extremity is healing and she reports that her shortness of breath is finally resolving since having COVID.  Assessment/Plan:   1. Pre-diabetes At goal. Goal is HgbA1c < 5.7.  Medication: metformin XR 1000 mg daily.    Plan:  Continue metformin. She will continue to focus on protein-rich, low simple carbohydrate foods. We reviewed the importance of hydration, regular exercise for stress reduction, and restorative sleep.   Lab Results  Component Value Date   HGBA1C 5.7 (H) 05/29/2021   Lab Results  Component Value Date   INSULIN 7.7 05/29/2021   INSULIN 14.4 01/02/2021   2. Essential hypertension At goal. Medications: None.   Plan: Avoid buying foods that are: processed, frozen, or prepackaged to avoid excess salt. We will watch for signs of hypotension as she continues lifestyle modifications.  BP Readings from Last 3 Encounters:  10/29/21 108/73  10/05/21 110/76  09/25/21 107/73   Lab Results  Component Value Date   CREATININE 1.11 (H) 05/29/2021   3. Mixed hyperlipidemia Course: Not at goal. Lipid-lowering medications: None.   Plan: Dietary changes: Increase soluble fiber, decrease simple carbohydrates, decrease saturated fat. Exercise changes: Moderate to vigorous-intensity aerobic activity 150 minutes per week or as tolerated. We will continue to monitor along with  PCP/specialists as it pertains to her weight loss journey.  Lab Results  Component Value Date   CHOL 168 05/29/2021   HDL 35 (L) 05/29/2021   LDLCALC 114 (H) 05/29/2021   LDLDIRECT 111.0 10/18/2019   TRIG 102 05/29/2021   CHOLHDL 4.5 (H) 01/02/2021   Lab Results  Component Value Date   ALT 14 05/29/2021   AST 13 05/29/2021   ALKPHOS 93 05/29/2021   BILITOT 0.3 05/29/2021   The 10-year ASCVD risk score (Arnett DK, et al., 2019) is: 0.9%   Values used to calculate the score:     Age: 30 years     Sex: Female     Is Non-Hispanic African American: No     Diabetic: No     Tobacco smoker: No     Systolic Blood Pressure: 400 mmHg     Is BP treated: No     HDL Cholesterol: 35 mg/dL     Total Cholesterol: 168 mg/dL  4. Obesity BMI today 34  Course: [name] is currently in the action stage of change. As such, her goal is to continue with weight loss efforts.   Nutrition goals: She has agreed to the Category 2 Plan.   Exercise goals: All adults should avoid inactivity. Some physical activity is better than none, and adults who participate in any amount of physical activity gain some health benefits.  Behavioral modification strategies: increasing lean protein intake, decreasing simple carbohydrates, increasing vegetables, increasing water intake, and decreasing liquid calories.  [name] has agreed to follow-up with our clinic in 4 weeks. She was informed of the importance of frequent follow-up visits  to maximize her success with intensive lifestyle modifications for her multiple health conditions.   Objective:   Blood pressure 108/73, pulse 71, temperature 98.1 F (36.7 C), height 5\' 6"  (1.676 m), weight 210 lb (95.3 kg), last menstrual period 10/03/2021, SpO2 98 %. Body mass index is 33.89 kg/m.  General: Cooperative, alert, well developed, in no acute distress. HEENT: Conjunctivae and lids unremarkable. Cardiovascular: Regular rhythm.  Lungs: Normal work of  breathing. Neurologic: No focal deficits.   Lab Results  Component Value Date   CREATININE 1.11 (H) 05/29/2021   BUN 13 05/29/2021   NA 139 05/29/2021   K 4.5 05/29/2021   CL 97 05/29/2021   CO2 24 05/29/2021   Lab Results  Component Value Date   ALT 14 05/29/2021   AST 13 05/29/2021   ALKPHOS 93 05/29/2021   BILITOT 0.3 05/29/2021   Lab Results  Component Value Date   HGBA1C 5.7 (H) 05/29/2021   HGBA1C 5.9 (H) 01/02/2021   HGBA1C 5.7 (H) 09/20/2020   HGBA1C 6.0 10/18/2019   HGBA1C 6.2 08/02/2019   Lab Results  Component Value Date   INSULIN 7.7 05/29/2021   INSULIN 14.4 01/02/2021   Lab Results  Component Value Date   TSH 2.410 01/02/2021   Lab Results  Component Value Date   CHOL 168 05/29/2021   HDL 35 (L) 05/29/2021   LDLCALC 114 (H) 05/29/2021   LDLDIRECT 111.0 10/18/2019   TRIG 102 05/29/2021   CHOLHDL 4.5 (H) 01/02/2021   Lab Results  Component Value Date   VD25OH 42.8 05/29/2021   VD25OH 42.2 01/02/2021   VD25OH 29.6 11/28/2015   Lab Results  Component Value Date   WBC 13.1 (H) 01/02/2021   HGB 13.5 01/02/2021   HCT 42.8 01/02/2021   MCV 88 01/02/2021   PLT 543 (H) 01/02/2021   Attestation Statements:   Reviewed by clinician on day of visit: allergies, medications, problem list, medical history, surgical history, family history, social history, and previous encounter notes.  I, Water quality scientist, CMA, am acting as transcriptionist for Briscoe Deutscher, DO  I have reviewed the above documentation for accuracy and completeness, and I agree with the above. -  Briscoe Deutscher, DO, MS, FAAFP, DABOM - Family and Bariatric Medicine.

## 2021-11-01 ENCOUNTER — Ambulatory Visit (INDEPENDENT_AMBULATORY_CARE_PROVIDER_SITE_OTHER): Payer: Medicare Other | Admitting: Physician Assistant

## 2021-11-01 ENCOUNTER — Encounter: Payer: Self-pay | Admitting: Physician Assistant

## 2021-11-01 ENCOUNTER — Other Ambulatory Visit: Payer: Self-pay

## 2021-11-01 DIAGNOSIS — F3341 Major depressive disorder, recurrent, in partial remission: Secondary | ICD-10-CM

## 2021-11-01 DIAGNOSIS — F319 Bipolar disorder, unspecified: Secondary | ICD-10-CM

## 2021-11-01 DIAGNOSIS — F902 Attention-deficit hyperactivity disorder, combined type: Secondary | ICD-10-CM

## 2021-11-01 DIAGNOSIS — F411 Generalized anxiety disorder: Secondary | ICD-10-CM

## 2021-11-01 MED ORDER — AMPHETAMINE-DEXTROAMPHET ER 10 MG PO CP24: 10.0000 mg | ORAL_CAPSULE | Freq: Two times a day (BID) | ORAL | 0 refills | Status: DC

## 2021-11-01 MED ORDER — ALPRAZOLAM 1 MG PO TABS: ORAL_TABLET | ORAL | 0 refills | Status: DC

## 2021-11-01 MED ORDER — BUPROPION HCL ER (XL) 300 MG PO TB24: 300.0000 mg | ORAL_TABLET | Freq: Every day | ORAL | 3 refills | Status: DC

## 2021-11-01 NOTE — Progress Notes (Signed)
[facility]  Patient ID: Crystal Bean,  MRN: [id number]  PCP: Inda Coke, PA  Date of Evaluation: 11/01/2021 Time spent:30 minutes  Chief Complaint:  Chief Complaint   Anxiety; Depression; ADHD; Follow-up       HISTORY/CURRENT STATUS: HPI For routine med check.  At LOV, Ativan was given in place of Xanax, d/t drowsiness. Ativan hasn't helped like the Xanax did. Not needing it very often.  Other than that, she is doing well with current medications.  Patient denies loss of interest in usual activities and is able to enjoy things.  She has started making candy which is enjoyable.  Denies decreased energy or motivation.  Appetite has not changed.  No extreme sadness, tearfulness, or feelings of hopelessness. Denies suicidal or homicidal thoughts.  States that attention is good without easy distractibility.  Able to focus on things and finish tasks to completion.   Patient denies increased energy with decreased need for sleep, no increased talkativeness, no racing thoughts, no impulsivity or risky behaviors, no increased spending, no increased libido, no grandiosity, no increased irritability or anger, and no hallucinations.  Denies dizziness, syncope, seizures, numbness, tingling, tremor, tics, unsteady gait, slurred speech, confusion. Denies muscle or joint pain, stiffness, or dystonia. Denies unexplained weight loss, frequent infections, or sores that heal slowly.  No polyphagia, polydipsia, or polyuria. Denies visual changes or paresthesias.   Individual Medical History/ Review of Systems: Changes? :No   Past medications for mental health diagnoses include: Prozac, Celexa, Lexapro, Wellbutrin, Effexor XR, Pristiq, Abilify,  Adderall, Ritalin, Klonopin, Xanax, Gabapentin, Buspar  Allergies: Prednisone, Meloxicam, and Penicillins  Current Medications:  Current Outpatient Medications:    ARIPiprazole (ABILIFY) 10 MG tablet, TAKE ONE TABLET BY MOUTH DAILY,  Disp: 90 tablet, Rfl: 3   Cholecalciferol (VITAMIN D3) 10 MCG (400 UNIT) CAPS, Take by mouth., Disp: , Rfl:    diclofenac (VOLTAREN) 75 MG EC tablet, Take 1 tablet (75 mg total) by mouth 2 (two) times daily., Disp: 60 tablet, Rfl: 3   esomeprazole (NEXIUM) 20 MG capsule, Take 20 mg by mouth daily at 12 noon., Disp: , Rfl:    gabapentin (NEURONTIN) 100 MG capsule, TAKE 1 CAPSULE(S) BY MOUTH DAILY . MAY INCRASE TO 3 CAPSULES DAILY AS TOLERATED (Patient taking differently: TAKE 1 CAPSULE(S) BY MOUTH DAILY), Disp: 90 capsule, Rfl: 0   ketoconazole (NIZORAL) 2 % cream, Apply to affected area 1-2 times daily, Disp: 15 g, Rfl: 0   Magnesium 250 MG TABS, Take by mouth., Disp: , Rfl:    metFORMIN (GLUCOPHAGE-XR) 500 MG 24 hr tablet, Take 2 tablets (1,000 mg total) by mouth daily with breakfast., Disp: 60 tablet, Rfl: 2   Multiple Vitamin (MULTIVITAMIN) tablet, Take 1 tablet by mouth daily., Disp: , Rfl:    triamcinolone ointment (KENALOG) 0.5 %, Apply to affected area 1-2 times daily, Disp: 15 g, Rfl: 0   venlafaxine XR (EFFEXOR-XR) 150 MG 24 hr capsule, TAKE TWO CAPSULES BY MOUTH EVERY MORNING WITH BREAKFAST, Disp: 180 capsule, Rfl: 3   ALPRAZolam (XANAX) 1 MG tablet, TAKE 1/2 TO 1 TABLET BY MOUTH TWO TIMES A DAY AS NEEDED FOR ANXIETY. Use sparingly., Disp: 60 tablet, Rfl: 0   [START ON 01/09/2022] amphetamine-dextroamphetamine (ADDERALL XR) 10 MG 24 hr capsule, Take 1 capsule (10 mg total) by mouth 2 (two) times daily with breakfast and lunch., Disp: 60 capsule, Rfl: 0   [START ON 12/10/2021] amphetamine-dextroamphetamine (ADDERALL XR) 10 MG 24 hr capsule, Take 1 capsule (10 mg total) by  mouth 2 (two) times daily with breakfast and lunch., Disp: 60 capsule, Rfl: 0   [START ON 11/09/2021] amphetamine-dextroamphetamine (ADDERALL XR) 10 MG 24 hr capsule, Take 1 capsule (10 mg total) by mouth 2 (two) times daily with breakfast and lunch., Disp: 60 capsule, Rfl: 0   aspirin 81 MG chewable tablet, Chew by mouth  daily. (Patient not taking: Reported on 11/01/2021), Disp: , Rfl:    buPROPion (WELLBUTRIN XL) 300 MG 24 hr tablet, Take 1 tablet (300 mg total) by mouth daily., Disp: 90 tablet, Rfl: 3   cephALEXin (KEFLEX) 500 MG capsule, Take 1 capsule (500 mg total) by mouth 4 (four) times daily., Disp: 28 capsule, Rfl: 0 Medication Side Effects: none  Family Medical/ Social History: Changes? No  MENTAL HEALTH EXAM:  Last menstrual period 10/03/2021.There is no height or weight on file to calculate BMI.  General Appearance: Casual, Well Groomed and Obese  Eye Contact:  Good  Speech:  Clear and Coherent and Normal Rate  Volume:  Normal  Mood:  Euthymic  Affect:  Congruent  Thought Process:  Goal Directed and Descriptions of Associations: Circumstantial  Orientation:  Full (Time, Place, and Person)  Thought Content: Logical   Suicidal Thoughts:  No  Homicidal Thoughts:  No  Memory:  WNL  Judgement:  Good  Insight:  Good  Psychomotor Activity:  Normal  Concentration:  Concentration: Good and Attention Span: Good  Recall:  Good  Fund of Knowledge: Good  Language: Good  Assets:  Desire for Improvement  ADL's:  Intact  Cognition: WNL  Prognosis:  Good    DIAGNOSES:    ICD-10-CM   1. Recurrent major depressive disorder, in partial remission ([place])  F33.41     2. Attention deficit hyperactivity disorder (ADHD), combined type  F90.2     3. Generalized anxiety disorder  F41.1     4. Bipolar I disorder ([place])  F31.9         Receiving Psychotherapy: Yes  Leane Platt    RECOMMENDATIONS:  PDMP was reviewed.  Last Adderall filled 10/12/2021.  Last Ativan filled 08/06/2021. I provided 30 minutes of face to face time during this encounter, including time spent before and after the visit in records review, medical decision making, counseling pertinent to today's visit, and charting.  Change back to Xanax, she knows to take sparingly.  Restart Xanax 1 mg, 1/2-1 po bid prn. Use  sparingly. Continue Adderall XR 10 mg, 1 p.o. at breakfast and 1 p.o. at lunch. Continue Abilify 10 mg, 1 p.o. every morning. Continue Wellbutrin XL 300 mg, 1 p.o. daily. Continue gabapentin 100 mg, 1-3 daily as directed. Continue Effexor XR 150 mg, 2 p.o. every morning. Continue therapy. Return in 6 months.  Donnal Moat, PA-C

## 2021-11-08 ENCOUNTER — Encounter: Payer: Self-pay | Admitting: Family

## 2021-11-08 ENCOUNTER — Ambulatory Visit (INDEPENDENT_AMBULATORY_CARE_PROVIDER_SITE_OTHER): Payer: Medicare Other | Admitting: Family

## 2021-11-08 VITALS — BP 115/78 | HR 73 | Temp 96.6°F | Ht 66.0 in | Wt 215.6 lb

## 2021-11-08 DIAGNOSIS — J029 Acute pharyngitis, unspecified: Secondary | ICD-10-CM | POA: Insufficient documentation

## 2021-11-08 LAB — POCT RAPID STREP A (OFFICE): Rapid Strep A Screen: NEGATIVE

## 2021-11-08 NOTE — Assessment & Plan Note (Signed)
rapid strep negative. pt exposed to dtr positive last week. advised on fluids, generic Aleve bid for next few days, warm salt water gargles.

## 2021-11-08 NOTE — Progress Notes (Signed)
Subjective:     Patient ID: Crystal Bean, female    DOB: [birth year], 45 y.o.   MRN: [id number]  Chief Complaint  Patient presents with   Sore Throat    Symptoms started 2 weeks ago. Exposed to Strep.    Cough    HPI: Sore Throat: Patient complains of sore throat. Associated symptoms include dry cough, post nasal drip, sinus and nasal congestion, and sore throat.Onset of symptoms was 10 days ago, unchanged since that time. She is drinking moderate amounts of fluids. She has had recent close exposure to someone with proven streptococcal pharyngitis.    Health Maintenance Due  Topic Date Due   Pneumococcal Vaccine 48-46 Years old (1 - PCV) Never done   HIV Screening  Never done   Hepatitis C Screening  Never done   COLONOSCOPY (Pts 45-55yrs Insurance coverage will need to be confirmed)  Never done    Past Medical History:  Diagnosis Date   ADHD (attention deficit hyperactivity disorder)    Anxiety    B12 deficiency    Back pain    Bipolar 1 disorder (HCC)    Bulging lumbar disc    C. difficile diarrhea 04/24/2016   treated with metronidazole   Constipation    Depression    GERD (gastroesophageal reflux disease)    History of chicken pox    History of shingles 2013   Hypertension    IBS (irritable bowel syndrome)    Joint pain    Major depressive disorder    Migraines    Obesity    Obsessive-compulsive disorder    Osteoarthritis    Prediabetes    PTSD (post-traumatic stress disorder)    Sleep apnea    SOB (shortness of breath)     Past Surgical History:  Procedure Laterality Date   BLADDER SURGERY  1981   Urethera stretched   caps on teeth  1980   Caps on all Teeth   CESAREAN SECTION  2003   TONSILLECTOMY     WISDOM TOOTH EXTRACTION      Outpatient Medications Prior to Visit  Medication Sig Dispense Refill   ALPRAZolam (XANAX) 1 MG tablet TAKE 1/2 TO 1 TABLET BY MOUTH TWO TIMES A DAY AS NEEDED FOR ANXIETY. Use sparingly. 60 tablet 0   [START ON  01/09/2022] amphetamine-dextroamphetamine (ADDERALL XR) 10 MG 24 hr capsule Take 1 capsule (10 mg total) by mouth 2 (two) times daily with breakfast and lunch. 60 capsule 0   [START ON 12/10/2021] amphetamine-dextroamphetamine (ADDERALL XR) 10 MG 24 hr capsule Take 1 capsule (10 mg total) by mouth 2 (two) times daily with breakfast and lunch. 60 capsule 0   [START ON 11/09/2021] amphetamine-dextroamphetamine (ADDERALL XR) 10 MG 24 hr capsule Take 1 capsule (10 mg total) by mouth 2 (two) times daily with breakfast and lunch. 60 capsule 0   ARIPiprazole (ABILIFY) 10 MG tablet TAKE ONE TABLET BY MOUTH DAILY 90 tablet 3   buPROPion (WELLBUTRIN XL) 300 MG 24 hr tablet Take 1 tablet (300 mg total) by mouth daily. 90 tablet 3   Cholecalciferol (VITAMIN D3) 10 MCG (400 UNIT) CAPS Take by mouth.     diclofenac (VOLTAREN) 75 MG EC tablet Take 1 tablet (75 mg total) by mouth 2 (two) times daily. 60 tablet 3   esomeprazole (NEXIUM) 20 MG capsule Take 20 mg by mouth daily at 12 noon.     ketoconazole (NIZORAL) 2 % cream Apply to affected area 1-2 times daily 15 g 0  Magnesium 250 MG TABS Take by mouth.     metFORMIN (GLUCOPHAGE-XR) 500 MG 24 hr tablet Take 2 tablets (1,000 mg total) by mouth daily with breakfast. 60 tablet 2   Multiple Vitamin (MULTIVITAMIN) tablet Take 1 tablet by mouth daily.     triamcinolone ointment (KENALOG) 0.5 % Apply to affected area 1-2 times daily 15 g 0   venlafaxine XR (EFFEXOR-XR) 150 MG 24 hr capsule TAKE TWO CAPSULES BY MOUTH EVERY MORNING WITH BREAKFAST 180 capsule 3   gabapentin (NEURONTIN) 100 MG capsule TAKE 1 CAPSULE(S) BY MOUTH DAILY . MAY INCRASE TO 3 CAPSULES DAILY AS TOLERATED (Patient taking differently: TAKE 1 CAPSULE(S) BY MOUTH DAILY) 90 capsule 0   aspirin 81 MG chewable tablet Chew by mouth daily. (Patient not taking: Reported on 11/01/2021)     No facility-administered medications prior to visit.    Allergies  Allergen Reactions   Prednisone Other (See  Comments)    Severe mood swings   Meloxicam Rash   Penicillins Rash        Objective:    Physical Exam Vitals and nursing note reviewed.  Constitutional:      Appearance: Normal appearance.  HENT:     Right Ear: Tympanic membrane and ear canal normal.     Left Ear: Tympanic membrane and ear canal normal.     Nose:     Right Sinus: No frontal sinus tenderness.     Left Sinus: No frontal sinus tenderness.     Mouth/Throat:     Mouth: Mucous membranes are moist.     Pharynx: Oropharyngeal exudate and posterior oropharyngeal erythema present. No pharyngeal swelling.  Cardiovascular:     Rate and Rhythm: Normal rate and regular rhythm.  Pulmonary:     Effort: Pulmonary effort is normal.     Breath sounds: Normal breath sounds.  Musculoskeletal:        General: Normal range of motion.  Skin:    General: Skin is warm and dry.  Neurological:     Mental Status: She is alert.  Psychiatric:        Mood and Affect: Mood normal.        Behavior: Behavior normal.    BP 115/78    Pulse 73    Temp (!) 96.6 F (35.9 C) (Temporal)    Ht 5\' 6"  (1.676 m)    Wt 215 lb 9.6 oz (97.8 kg)    SpO2 100%    BMI 34.80 kg/m  Wt Readings from Last 3 Encounters:  11/08/21 215 lb 9.6 oz (97.8 kg)  10/29/21 210 lb (95.3 kg)  10/05/21 215 lb 6.1 oz (97.7 kg)       Assessment & Plan:   Problem List Items Addressed This Visit       Other   Sore throat - Primary    rapid strep negative. pt exposed to dtr positive last week. advised on fluids, generic Aleve bid for next few days, warm salt water gargles.      Relevant Orders   POCT rapid strep A (Completed)

## 2021-11-08 NOTE — Patient Instructions (Signed)

## 2021-11-13 ENCOUNTER — Ambulatory Visit: Payer: Medicare Other | Admitting: Physician Assistant

## 2021-11-23 ENCOUNTER — Other Ambulatory Visit: Payer: Self-pay | Admitting: Physician Assistant

## 2021-11-27 ENCOUNTER — Ambulatory Visit: Payer: Medicare Other | Admitting: Physician Assistant

## 2021-12-02 ENCOUNTER — Other Ambulatory Visit: Payer: Self-pay | Admitting: Physician Assistant

## 2021-12-10 ENCOUNTER — Ambulatory Visit (INDEPENDENT_AMBULATORY_CARE_PROVIDER_SITE_OTHER): Payer: Medicare Other | Admitting: Family Medicine

## 2021-12-10 ENCOUNTER — Encounter (INDEPENDENT_AMBULATORY_CARE_PROVIDER_SITE_OTHER): Payer: Self-pay | Admitting: Family Medicine

## 2021-12-10 ENCOUNTER — Other Ambulatory Visit: Payer: Self-pay

## 2021-12-10 VITALS — BP 113/76 | HR 75 | Temp 98.1°F | Ht 66.0 in | Wt 211.0 lb

## 2021-12-10 DIAGNOSIS — G8929 Other chronic pain: Secondary | ICD-10-CM | POA: Diagnosis not present

## 2021-12-10 DIAGNOSIS — M533 Sacrococcygeal disorders, not elsewhere classified: Secondary | ICD-10-CM

## 2021-12-10 DIAGNOSIS — Z6834 Body mass index (BMI) 34.0-34.9, adult: Secondary | ICD-10-CM

## 2021-12-10 DIAGNOSIS — M5441 Lumbago with sciatica, right side: Secondary | ICD-10-CM | POA: Diagnosis not present

## 2021-12-10 DIAGNOSIS — E8881 Metabolic syndrome: Secondary | ICD-10-CM

## 2021-12-10 DIAGNOSIS — E669 Obesity, unspecified: Secondary | ICD-10-CM

## 2021-12-12 NOTE — Progress Notes (Signed)
Chief Complaint:   OBESITY Crystal Bean is here to discuss her progress with her obesity treatment plan along with follow-up of her obesity related diagnoses. See Medical Weight Management Flowsheet for complete bioelectrical impedance results.  Today's visit was #: 79 Starting weight: 252 lbs Starting date: 01/02/2021 Weight change since last visit: +1 lb Total lbs lost to date: 41 lbs Total weight loss percentage to date: -16.27%  Nutrition Plan: Category 2 Plan for 50% of the time.  Activity: Cardio for 30-45 minutes 2 times per week.   Interim History: [name] says she is going back to the gym for 30 minutes at at time.  She is having pain in her right SI joint and has lumbar DJD.  She is seeing Ruben Im, PT, for Physical Therapy.  Assessment/Plan:   1. Chronic right-sided low back pain with right-sided sciatica [name] is seeing Ruben Im for Physical Therapy. The current medical regimen is effective;  continue present plan and medications.  2. Pain of right sacroiliac joint [name] is going to PT.  Will continue to monitor as it relates to her weight loss journey.  3. Metabolic syndrome Starting goal: Lose 7-10% of starting weight. She will continue to focus on protein-rich, low simple carbohydrate foods. We reviewed the importance of hydration, regular exercise for stress reduction, and restorative sleep.  We will continue to check lab work every 3 months, with 10% weight loss, or should any other concerns arise. Medications reviewed. The current medical regimen is effective;  continue present plan and medications.  4. Obesity, BMI today 34.2  Course: [name] is currently in the action stage of change. As such, her goal is to continue with weight loss efforts.   Nutrition goals: She has agreed to the Category 2 Plan.   Exercise goals:  As is.  Behavioral modification strategies: increasing lean protein intake, decreasing simple carbohydrates, increasing vegetables, and  increasing water intake.  [name] has agreed to follow-up with our clinic in 4 weeks. She was informed of the importance of frequent follow-up visits to maximize her success with intensive lifestyle modifications for her multiple health conditions.   Objective:   Blood pressure 113/76, pulse 75, temperature 98.1 F (36.7 C), temperature source Oral, height 5\' 6"  (1.676 m), weight 211 lb (95.7 kg), SpO2 95 %. Body mass index is 34.06 kg/m.  General: Cooperative, alert, well developed, in no acute distress. HEENT: Conjunctivae and lids unremarkable. Cardiovascular: Regular rhythm.  Lungs: Normal work of breathing. Neurologic: No focal deficits.   Lab Results  Component Value Date   CREATININE 1.11 (H) 05/29/2021   BUN 13 05/29/2021   NA 139 05/29/2021   K 4.5 05/29/2021   CL 97 05/29/2021   CO2 24 05/29/2021   Lab Results  Component Value Date   ALT 14 05/29/2021   AST 13 05/29/2021   ALKPHOS 93 05/29/2021   BILITOT 0.3 05/29/2021   Lab Results  Component Value Date   HGBA1C 5.7 (H) 05/29/2021   HGBA1C 5.9 (H) 01/02/2021   HGBA1C 5.7 (H) 09/20/2020   HGBA1C 6.0 10/18/2019   HGBA1C 6.2 08/02/2019   Lab Results  Component Value Date   INSULIN 7.7 05/29/2021   INSULIN 14.4 01/02/2021   Lab Results  Component Value Date   TSH 2.410 01/02/2021   Lab Results  Component Value Date   CHOL 168 05/29/2021   HDL 35 (L) 05/29/2021   LDLCALC 114 (H) 05/29/2021   LDLDIRECT 111.0 10/18/2019   TRIG 102 05/29/2021  CHOLHDL 4.5 (H) 01/02/2021   Lab Results  Component Value Date   VD25OH 42.8 05/29/2021   VD25OH 42.2 01/02/2021   VD25OH 29.6 11/28/2015   Lab Results  Component Value Date   WBC 13.1 (H) 01/02/2021   HGB 13.5 01/02/2021   HCT 42.8 01/02/2021   MCV 88 01/02/2021   PLT 543 (H) 01/02/2021   Attestation Statements:   Reviewed by clinician on day of visit: allergies, medications, problem list, medical history, surgical history, family history, social  history, and previous encounter notes.  I, Water quality scientist, CMA, am acting as transcriptionist for Briscoe Deutscher, DO  I have reviewed the above documentation for accuracy and completeness, and I agree with the above. -  Briscoe Deutscher, DO, MS, FAAFP, DABOM - Family and Bariatric Medicine.

## 2021-12-19 DIAGNOSIS — I219 Acute myocardial infarction, unspecified: Secondary | ICD-10-CM

## 2021-12-19 HISTORY — DX: Acute myocardial infarction, unspecified: I21.9

## 2021-12-27 ENCOUNTER — Emergency Department (HOSPITAL_BASED_OUTPATIENT_CLINIC_OR_DEPARTMENT_OTHER): Payer: Medicare Other | Admitting: Radiology

## 2021-12-27 ENCOUNTER — Encounter (HOSPITAL_BASED_OUTPATIENT_CLINIC_OR_DEPARTMENT_OTHER): Payer: Self-pay

## 2021-12-27 ENCOUNTER — Other Ambulatory Visit: Payer: Self-pay

## 2021-12-27 ENCOUNTER — Inpatient Hospital Stay (HOSPITAL_BASED_OUTPATIENT_CLINIC_OR_DEPARTMENT_OTHER)
Admission: EM | Admit: 2021-12-27 | Discharge: 2021-12-29 | DRG: 247 | Disposition: A | Discharge status: Home / Self Care | Payer: Medicare Other | Attending: Cardiology | Admitting: Cardiology

## 2021-12-27 DIAGNOSIS — Z955 Presence of coronary angioplasty implant and graft: Secondary | ICD-10-CM

## 2021-12-27 DIAGNOSIS — Z880 Allergy status to penicillin: Secondary | ICD-10-CM | POA: Diagnosis not present

## 2021-12-27 DIAGNOSIS — Z8262 Family history of osteoporosis: Secondary | ICD-10-CM

## 2021-12-27 DIAGNOSIS — Z8261 Family history of arthritis: Secondary | ICD-10-CM

## 2021-12-27 DIAGNOSIS — Z823 Family history of stroke: Secondary | ICD-10-CM | POA: Diagnosis not present

## 2021-12-27 DIAGNOSIS — Z20822 Contact with and (suspected) exposure to covid-19: Secondary | ICD-10-CM | POA: Diagnosis present

## 2021-12-27 DIAGNOSIS — I2510 Atherosclerotic heart disease of native coronary artery without angina pectoris: Secondary | ICD-10-CM | POA: Diagnosis present

## 2021-12-27 DIAGNOSIS — Z822 Family history of deafness and hearing loss: Secondary | ICD-10-CM

## 2021-12-27 DIAGNOSIS — Z79899 Other long term (current) drug therapy: Secondary | ICD-10-CM

## 2021-12-27 DIAGNOSIS — Z888 Allergy status to other drugs, medicaments and biological substances status: Secondary | ICD-10-CM

## 2021-12-27 DIAGNOSIS — F319 Bipolar disorder, unspecified: Secondary | ICD-10-CM | POA: Diagnosis present

## 2021-12-27 DIAGNOSIS — Z818 Family history of other mental and behavioral disorders: Secondary | ICD-10-CM

## 2021-12-27 DIAGNOSIS — G4730 Sleep apnea, unspecified: Secondary | ICD-10-CM | POA: Diagnosis present

## 2021-12-27 DIAGNOSIS — K589 Irritable bowel syndrome without diarrhea: Secondary | ICD-10-CM | POA: Diagnosis not present

## 2021-12-27 DIAGNOSIS — R7303 Prediabetes: Secondary | ICD-10-CM | POA: Diagnosis present

## 2021-12-27 DIAGNOSIS — I10 Essential (primary) hypertension: Secondary | ICD-10-CM | POA: Diagnosis not present

## 2021-12-27 DIAGNOSIS — Z7984 Long term (current) use of oral hypoglycemic drugs: Secondary | ICD-10-CM | POA: Diagnosis not present

## 2021-12-27 DIAGNOSIS — G43909 Migraine, unspecified, not intractable, without status migrainosus: Secondary | ICD-10-CM | POA: Diagnosis present

## 2021-12-27 DIAGNOSIS — I214 Non-ST elevation (NSTEMI) myocardial infarction: Principal | ICD-10-CM | POA: Diagnosis present

## 2021-12-27 DIAGNOSIS — Z8249 Family history of ischemic heart disease and other diseases of the circulatory system: Secondary | ICD-10-CM | POA: Diagnosis not present

## 2021-12-27 DIAGNOSIS — R079 Chest pain, unspecified: Secondary | ICD-10-CM | POA: Diagnosis present

## 2021-12-27 DIAGNOSIS — Z87891 Personal history of nicotine dependence: Secondary | ICD-10-CM | POA: Diagnosis not present

## 2021-12-27 DIAGNOSIS — I2583 Coronary atherosclerosis due to lipid rich plaque: Secondary | ICD-10-CM | POA: Diagnosis not present

## 2021-12-27 DIAGNOSIS — M1990 Unspecified osteoarthritis, unspecified site: Secondary | ICD-10-CM | POA: Diagnosis present

## 2021-12-27 DIAGNOSIS — F4310 Post-traumatic stress disorder, unspecified: Secondary | ICD-10-CM | POA: Diagnosis present

## 2021-12-27 DIAGNOSIS — F429 Obsessive-compulsive disorder, unspecified: Secondary | ICD-10-CM | POA: Diagnosis present

## 2021-12-27 DIAGNOSIS — E785 Hyperlipidemia, unspecified: Secondary | ICD-10-CM | POA: Diagnosis present

## 2021-12-27 DIAGNOSIS — K219 Gastro-esophageal reflux disease without esophagitis: Secondary | ICD-10-CM | POA: Diagnosis present

## 2021-12-27 DIAGNOSIS — E7800 Pure hypercholesterolemia, unspecified: Secondary | ICD-10-CM | POA: Diagnosis not present

## 2021-12-27 LAB — CBC
HCT: 41.2 % (ref 36.0–46.0)
Hemoglobin: 12.9 g/dL (ref 12.0–15.0)
MCH: 27.4 pg (ref 26.0–34.0)
MCHC: 31.3 g/dL (ref 30.0–36.0)
MCV: 87.5 fL (ref 80.0–100.0)
Platelets: 453 10*3/uL — ABNORMAL HIGH (ref 150–400)
RBC: 4.71 MIL/uL (ref 3.87–5.11)
RDW: 14.8 % (ref 11.5–15.5)
WBC: 11.2 10*3/uL — ABNORMAL HIGH (ref 4.0–10.5)
nRBC: 0 % (ref 0.0–0.2)

## 2021-12-27 LAB — LIPID PANEL
Cholesterol: 165 mg/dL (ref 0–200)
HDL: 36 mg/dL — ABNORMAL LOW (ref >40)
LDL Cholesterol: 98 mg/dL (ref 0–99)
Total CHOL/HDL Ratio: 4.6 RATIO
Triglycerides: 154 mg/dL — ABNORMAL HIGH (ref <150)
VLDL: 31 mg/dL (ref 0–40)

## 2021-12-27 LAB — TYPE AND SCREEN
ABO/RH(D): A POS
Antibody Screen: NEGATIVE

## 2021-12-27 LAB — BASIC METABOLIC PANEL
Anion gap: 8 (ref 5–15)
BUN: 15 mg/dL (ref 6–20)
CO2: 27 mmol/L (ref 22–32)
Calcium: 9 mg/dL (ref 8.9–10.3)
Chloride: 102 mmol/L (ref 98–111)
Creatinine, Ser: 1.02 mg/dL — ABNORMAL HIGH (ref 0.44–1.00)
GFR, Estimated: >60 mL/min (ref >60)
Glucose, Bld: 96 mg/dL (ref 70–99)
Potassium: 4.1 mmol/L (ref 3.5–5.1)
Sodium: 137 mmol/L (ref 135–145)

## 2021-12-27 LAB — HIV ANTIBODY (ROUTINE TESTING W REFLEX): HIV Screen 4th Generation wRfx: NONREACTIVE

## 2021-12-27 LAB — PROTIME-INR
INR: 0.9 (ref 0.8–1.2)
Prothrombin Time: 12.2 seconds (ref 11.4–15.2)

## 2021-12-27 LAB — HEPATIC FUNCTION PANEL
ALT: 16 U/L (ref 0–44)
AST: 16 U/L (ref 15–41)
Albumin: 3.5 g/dL (ref 3.5–5.0)
Alkaline Phosphatase: 61 U/L (ref 38–126)
Bilirubin, Direct: <0.1 mg/dL (ref 0.0–0.2)
Total Bilirubin: 0.3 mg/dL (ref 0.3–1.2)
Total Protein: 6.9 g/dL (ref 6.5–8.1)

## 2021-12-27 LAB — HEMOGLOBIN A1C
Hgb A1c MFr Bld: 5.2 % (ref 4.8–5.6)
Mean Plasma Glucose: 102.54 mg/dL

## 2021-12-27 LAB — HEPARIN LEVEL (UNFRACTIONATED): Heparin Unfractionated: <0.1 IU/mL — ABNORMAL LOW (ref 0.30–0.70)

## 2021-12-27 LAB — RESP PANEL BY RT-PCR (FLU A&B, COVID) ARPGX2
Influenza A by PCR: NEGATIVE
Influenza B by PCR: NEGATIVE
SARS Coronavirus 2 by RT PCR: NEGATIVE

## 2021-12-27 LAB — TROPONIN I (HIGH SENSITIVITY)
Troponin I (High Sensitivity): 34 ng/L — ABNORMAL HIGH (ref <18)
Troponin I (High Sensitivity): 104 ng/L (ref <18)

## 2021-12-27 LAB — APTT: aPTT: 32 seconds (ref 24–36)

## 2021-12-27 LAB — PREGNANCY, URINE: Preg Test, Ur: NEGATIVE

## 2021-12-27 LAB — BRAIN NATRIURETIC PEPTIDE: B Natriuretic Peptide: 103.2 pg/mL — ABNORMAL HIGH (ref 0.0–100.0)

## 2021-12-27 LAB — MAGNESIUM: Magnesium: 2 mg/dL (ref 1.7–2.4)

## 2021-12-27 LAB — TSH: TSH: 5.967 u[IU]/mL — ABNORMAL HIGH (ref 0.350–4.500)

## 2021-12-27 MED ORDER — ROSUVASTATIN CALCIUM 20 MG PO TABS: 20.0000 mg | ORAL_TABLET | Freq: Every day | ORAL | Status: DC

## 2021-12-27 MED ORDER — METOPROLOL TARTRATE 12.5 MG HALF TABLET
12.5000 mg | ORAL_TABLET | Freq: Two times a day (BID) | ORAL | Status: DC
  Administered 2021-12-28 – 2021-12-29 (×3): 12.5 mg via ORAL
  Filled 2021-12-27 (×3): qty 1

## 2021-12-27 MED ORDER — VITAMIN B-12 100 MCG PO TABS
100.0000 ug | ORAL_TABLET | Freq: Every day | ORAL | Status: DC
  Administered 2021-12-28 – 2021-12-29 (×2): 100 ug via ORAL
  Filled 2021-12-27 (×2): qty 1

## 2021-12-27 MED ORDER — ROSUVASTATIN CALCIUM 20 MG PO TABS
20.0000 mg | ORAL_TABLET | Freq: Every day | ORAL | Status: DC
  Administered 2021-12-28: 20 mg via ORAL
  Filled 2021-12-27: qty 1

## 2021-12-27 MED ORDER — VENLAFAXINE HCL ER 150 MG PO CP24
300.0000 mg | ORAL_CAPSULE | Freq: Every day | ORAL | Status: DC
  Administered 2021-12-28 – 2021-12-29 (×2): 300 mg via ORAL
  Filled 2021-12-27 (×2): qty 2

## 2021-12-27 MED ORDER — HEPARIN (PORCINE) 25000 UT/250ML-% IV SOLN
1500.0000 [IU]/h | INTRAVENOUS | Status: DC
  Administered 2021-12-27: 1000 [IU]/h via INTRAVENOUS
  Administered 2021-12-28: 1250 [IU]/h via INTRAVENOUS
  Filled 2021-12-27 (×2): qty 250

## 2021-12-27 MED ORDER — ADULT MULTIVITAMIN W/MINERALS CH
1.0000 | ORAL_TABLET | Freq: Every day | ORAL | Status: DC
  Administered 2021-12-28 – 2021-12-29 (×2): 1 via ORAL
  Filled 2021-12-27 (×3): qty 1

## 2021-12-27 MED ORDER — LISINOPRIL 5 MG PO TABS
2.5000 mg | ORAL_TABLET | Freq: Every day | ORAL | Status: DC
  Administered 2021-12-28 – 2021-12-29 (×2): 2.5 mg via ORAL
  Filled 2021-12-27 (×3): qty 1

## 2021-12-27 MED ORDER — HEPARIN BOLUS VIA INFUSION
4000.0000 [IU] | Freq: Once | INTRAVENOUS | Status: AC
  Administered 2021-12-27: 4000 [IU] via INTRAVENOUS

## 2021-12-27 MED ORDER — ARIPIPRAZOLE 5 MG PO TABS
10.0000 mg | ORAL_TABLET | Freq: Every day | ORAL | Status: DC
Start: 2021-12-28 — End: 2021-12-29
  Administered 2021-12-28 – 2021-12-29 (×2): 10 mg via ORAL
  Filled 2021-12-27 (×2): qty 2

## 2021-12-27 MED ORDER — NITROGLYCERIN 0.4 MG SL SUBL
0.4000 mg | SUBLINGUAL_TABLET | SUBLINGUAL | Status: DC | PRN
  Administered 2021-12-28: 0.4 mg via SUBLINGUAL
  Filled 2021-12-27: qty 1

## 2021-12-27 MED ORDER — ASPIRIN EC 81 MG PO TBEC
81.0000 mg | DELAYED_RELEASE_TABLET | Freq: Every day | ORAL | Status: DC
  Administered 2021-12-29: 81 mg via ORAL
  Filled 2021-12-27: qty 1

## 2021-12-27 MED ORDER — HEPARIN BOLUS VIA INFUSION
2400.0000 [IU] | Freq: Once | INTRAVENOUS | Status: AC
  Administered 2021-12-27: 2400 [IU] via INTRAVENOUS
  Filled 2021-12-27: qty 2400

## 2021-12-27 MED ORDER — ASPIRIN 81 MG PO CHEW
324.0000 mg | CHEWABLE_TABLET | Freq: Once | ORAL | Status: AC
  Administered 2021-12-27: 324 mg via ORAL
  Filled 2021-12-27: qty 4

## 2021-12-27 MED ORDER — BUPROPION HCL ER (XL) 150 MG PO TB24
300.0000 mg | ORAL_TABLET | Freq: Every day | ORAL | Status: DC
  Administered 2021-12-28 – 2021-12-29 (×2): 300 mg via ORAL
  Filled 2021-12-27 (×2): qty 2

## 2021-12-27 MED ORDER — ACETAMINOPHEN 325 MG PO TABS: 650.0000 mg | ORAL_TABLET | ORAL | Status: DC | PRN

## 2021-12-27 MED ORDER — PANTOPRAZOLE SODIUM 40 MG PO TBEC
40.0000 mg | DELAYED_RELEASE_TABLET | Freq: Every day | ORAL | Status: DC
  Administered 2021-12-28 – 2021-12-29 (×2): 40 mg via ORAL
  Filled 2021-12-27 (×2): qty 1

## 2021-12-27 NOTE — Progress Notes (Signed)
ANTICOAGULATION CONSULT NOTE - Initial Consult  Pharmacy Consult for heparin Indication: chest pain/ACS  Allergies  Allergen Reactions   Prednisone Other (See Comments)    Severe mood swings   Meloxicam Rash   Penicillins Rash    Patient Measurements: Height: 5\' 6"  (167.6 cm) Weight: 95.7 kg (211 lb) IBW/kg (Calculated) : 59.3 Heparin Dosing Weight: 80.6kg  Vital Signs: Temp: 98.1 F (36.7 C) (02/09 1153) BP: 139/91 (02/09 1350) Pulse Rate: 69 (02/09 1350)  Labs: Recent Labs    12/27/21 1157  HGB 12.9  HCT 41.2  PLT 453*  CREATININE 1.02*  TROPONINIHS 34*    Estimated Creatinine Clearance: 80.4 mL/min (A) (by C-G formula based on SCr of 1.02 mg/dL (H)).   Medical History: Past Medical History:  Diagnosis Date   ADHD (attention deficit hyperactivity disorder)    Anxiety    B12 deficiency    Back pain    Bipolar 1 disorder (HCC)    Bulging lumbar disc    C. difficile diarrhea 04/24/2016   treated with metronidazole   Constipation    Depression    GERD (gastroesophageal reflux disease)    History of chicken pox    History of shingles 2013   Hypertension    IBS (irritable bowel syndrome)    Joint pain    Major depressive disorder    Migraines    Obesity    Obsessive-compulsive disorder    Osteoarthritis    Prediabetes    PTSD (post-traumatic stress disorder)    Sleep apnea    SOB (shortness of breath)     Medications:  Infusions:   heparin      Assessment: 14 yof presented to the ED with CP. Troponin elevated and now starting IV heparin. Baseline CBC is ok and she is not on anticoagulation PTA.   Goal of Therapy:  Heparin level 0.3-0.7 units/ml Monitor platelets by anticoagulation protocol: Yes   Plan:  Heparin bolus 4000 units IV x 1 Heparin gtt 1000 units/hr Check a 6 hr heparin level Daily heparin level and CBC  Kyleena Scheirer, Rande Lawman 12/27/2021,2:39 PM

## 2021-12-27 NOTE — ED Provider Notes (Signed)
[facility] Provider Note   CSN: [id number] Arrival date & time: 12/27/21  1141     History  Chief Complaint  Patient presents with   Chest Pain    Crystal Bean is a 46 y.o. female.  HPI 46 year old female presents with chest pain. Originally started last night. Has happened once last night and twice this morning.  Each time it lasted about an hour.  Feels like a dull sensation in the middle of her chest that radiates up to bilateral neck and down both arms.  Pre much goes to her left shoulder but then goes mostly down her right arm.  No back or abdominal pain.  No shortness of breath but it feels like she cannot get a full breath.  She does start to sweat when these episodes occur.  All 3 times she was not doing anything significant when it started.  She had a prior history of hypertension and states she has lost a lot of weight and so she has lost her designation for prediabetes and hypertension.  She denies cholesterol problems or known history of CAD in her or the family.  However she states she was left on metformin for weight loss control.  Former smoker. Last episode of pain was around 10 am.   Home Medications Prior to Admission medications   Medication Sig Start Date End Date Taking? Authorizing Provider  amphetamine-dextroamphetamine (ADDERALL XR) 10 MG 24 hr capsule Take 1 capsule (10 mg total) by mouth 2 (two) times daily with breakfast and lunch. 01/09/22  Yes Hurst, Teresa T, PA-C  ARIPiprazole (ABILIFY) 10 MG tablet TAKE ONE TABLET BY MOUTH DAILY 12/03/21  Yes Hurst, Teresa T, PA-C  buPROPion (WELLBUTRIN XL) 300 MG 24 hr tablet Take 1 tablet (300 mg total) by mouth daily. 11/01/21  Yes Hurst, Dorothea Glassman, PA-C  Cholecalciferol (VITAMIN D3) 10 MCG (400 UNIT) CAPS Take by mouth.   Yes [provider]  Cyanocobalamin (VITAMIN B-12 PO) Take by mouth.   Yes [provider]  diclofenac (VOLTAREN) 75 MG EC tablet Take 1 tablet (75 mg  total) by mouth 2 (two) times daily. 08/27/21  Yes Inda Coke, PA  esomeprazole (NEXIUM) 20 MG capsule Take 20 mg by mouth daily at 12 noon.   Yes [provider]  metFORMIN (GLUCOPHAGE-XR) 500 MG 24 hr tablet Take 2 tablets (1,000 mg total) by mouth daily with breakfast. 10/01/21  Yes Inda Coke, PA  Multiple Vitamin (MULTIVITAMIN) tablet Take 1 tablet by mouth daily.   Yes [provider]  Omega-3 Fatty Acids (FISH OIL PO) Take by mouth.   Yes [provider]  venlafaxine XR (EFFEXOR-XR) 150 MG 24 hr capsule TAKE TWO CAPSULES BY MOUTH EVERY MORNING WITH BREAKFAST 08/06/21  Yes Hurst, Teresa T, PA-C  ALPRAZolam (XANAX) 1 MG tablet TAKE 1/2 TO 1 TABLET BY MOUTH TWO TIMES A DAY AS NEEDED FOR ANXIETY. Use sparingly. 11/01/21   Donnal Moat T, PA-C  ketoconazole (NIZORAL) 2 % cream APPLY TO AFFECTED AREA(S) ONCE TO TWICE DAILY 11/23/21   Inda Coke, PA  triamcinolone ointment (KENALOG) 0.5 % Apply to affected area 1-2 times daily 09/21/21   Inda Coke, PA      Allergies    Prednisone, Meloxicam, and Penicillins    Review of Systems   Review of Systems  Constitutional:  Positive for diaphoresis.  Respiratory:  Negative for shortness of breath.   Cardiovascular:  Positive for chest pain.  Gastrointestinal:  Negative  for abdominal pain.  Musculoskeletal:  Negative for back pain.   Physical Exam Updated Vital Signs BP 128/80    Pulse 70    Temp 98.1 F (36.7 C)    Resp (!) 21    Ht 5\' 6"  (1.676 m)    Wt 95.7 kg    SpO2 100%    BMI 34.06 kg/m  Physical Exam Vitals and nursing note reviewed.  Constitutional:      General: She is not in acute distress.    Appearance: She is well-developed. She is not ill-appearing or diaphoretic.  HENT:     Head: Normocephalic and atraumatic.  Cardiovascular:     Rate and Rhythm: Normal rate and regular rhythm.     Pulses:          Radial pulses are 2+ on the right side and 2+ on the left side.     Heart  sounds: Normal heart sounds.  Pulmonary:     Effort: Pulmonary effort is normal.     Breath sounds: Normal breath sounds.  Abdominal:     Palpations: Abdomen is soft.     Tenderness: There is no abdominal tenderness.  Musculoskeletal:     Right lower leg: No edema.     Left lower leg: No edema.  Skin:    General: Skin is warm and dry.  Neurological:     Mental Status: She is alert.    ED Results / Procedures / Treatments   Labs (all labs ordered are listed, but only abnormal results are displayed) Labs Reviewed  BASIC METABOLIC PANEL - Abnormal; Notable for the following components:      Result Value   Creatinine, Ser 1.02 (*)    All other components within normal limits  CBC - Abnormal; Notable for the following components:   WBC 11.2 (*)    Platelets 453 (*)    All other components within normal limits  TROPONIN I (HIGH SENSITIVITY) - Abnormal; Notable for the following components:   Troponin I (High Sensitivity) 34 (*)    All other components within normal limits  RESP PANEL BY RT-PCR (FLU A&B, COVID) ARPGX2  PREGNANCY, URINE  LIPID PANEL  HEPARIN LEVEL (UNFRACTIONATED)  TROPONIN I (HIGH SENSITIVITY)    EKG EKG Interpretation  Date/Time:  Thursday December 27 2021 14:13:35 EST Ventricular Rate:  69 PR Interval:  133 QRS Duration: 109 QT Interval:  418 QTC Calculation: 448 R Axis:   54 Text Interpretation: Sinus rhythm Nonspecific T abnrm, anterolateral leads no significant change since earlier in the day Confirmed by Sherwood Gambler [phone]) on 12/27/2021 2:44:24 PM  Radiology DG Chest 2 View  Result Date: 12/27/2021 CLINICAL DATA:  Epigastric pain radiating into the neck, left jaw, and right arm. EXAM: CHEST - 2 VIEW COMPARISON:  Chest two views 11/01/2020 FINDINGS: The heart size and mediastinal contours are within normal limits. Both lungs are clear. No pleural effusion or pneumothorax. The visualized skeletal structures are unremarkable. IMPRESSION: No active  cardiopulmonary disease. Electronically Signed   By: Yvonne Kendall M.D.   On: 12/27/2021 12:27    Procedures .Critical Care Performed by: Sherwood Gambler, MD Authorized by: Sherwood Gambler, MD   Critical care provider statement:    Critical care time (minutes):  30   Critical care time was exclusive of:  Separately billable procedures and treating other patients   Critical care was necessary to treat or prevent imminent or life-threatening deterioration of the following conditions:  Cardiac failure   Critical care was  time spent personally by me on the following activities:  Development of treatment plan with patient or surrogate, discussions with consultants, evaluation of patient's response to treatment, examination of patient, ordering and review of laboratory studies, ordering and review of radiographic studies, ordering and performing treatments and interventions, pulse oximetry, re-evaluation of patient's condition and review of old charts    Medications Ordered in ED Medications  rosuvastatin (CRESTOR) tablet 20 mg (has no administration in time range)  heparin bolus via infusion 4,000 Units (has no administration in time range)  heparin ADULT infusion 100 units/mL (25000 units/21mL) (has no administration in time range)  aspirin chewable tablet 324 mg (324 mg Oral Given 12/27/21 1445)    ED Course/ Medical Decision Making/ A&P                            Patient's history of present illness is pretty concerning, and while she is not having active chest pain, she does have at least a troponin leak that is concerning for a mild non-STEMI.  We will need to see with the second 1 does.  I discussed with cardiology, Dr. Johney Frame, who agrees on admission to the cardiology service and we will start aspirin and heparin.  She asked for starting 20 mg Crestor, will also send lipid panel.  Besides the mildly elevated troponin, her labs are pretty unremarkable besides a slight leukocytosis of  unclear etiology.  I have viewed her chest x-ray images and there is no obvious pneumonia or CHF.  ECG shows some nonspecific V2 T wave inversion, but no old to compare to and no ST elevations.  I doubt PE or dissection.  At this point she is pain-free and I think stable for transfer to [facility] for admission.        Final Clinical Impression(s) / ED Diagnoses Final diagnoses:  NSTEMI (non-ST elevated myocardial infarction) [facility])    Rx / DC Orders ED Discharge Orders     None         Sherwood Gambler, MD 12/27/21 1454

## 2021-12-27 NOTE — ED Triage Notes (Signed)
Pt presents with epigastric pain radiating up into her neck, Left jaw and into her Right arm x1 day. "Severe discomfort" to Left arm when lying down last night, had to sleep in the recliner. Pt also experiencing nausea.   BP 139/103 HR 86 when in pain
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
## 2021-12-27 NOTE — Progress Notes (Signed)
ANTICOAGULATION CONSULT NOTE - Follow Up Consult  Pharmacy Consult for IV Heparin Indication: chest pain/ACS  Allergies  Allergen Reactions   Prednisone Other (See Comments)    Severe mood swings   Meloxicam Rash   Penicillins Rash    Patient Measurements: Height: 5\' 6"  (167.6 cm) Weight: 96.8 kg (213 lb 4.8 oz) IBW/kg (Calculated) : 59.3 Heparin Dosing Weight: 80.6 kg  Vital Signs: Temp: 97.5 F (36.4 C) (02/09 2048) Temp Source: Oral (02/09 2048) BP: 135/104 (02/09 2048) Pulse Rate: 71 (02/09 2048)  Labs: Recent Labs    12/27/21 1157 12/27/21 1450 12/27/21 2100  HGB 12.9  --   --   HCT 41.2  --   --   PLT 453*  --   --   HEPARINUNFRC  --   --  <0.10*  CREATININE 1.02*  --   --   TROPONINIHS 34* 104*  --     Estimated Creatinine Clearance: 80.8 mL/min (A) (by C-G formula based on SCr of 1.02 mg/dL (H)).   Medical History: Past Medical History:  Diagnosis Date   ADHD (attention deficit hyperactivity disorder)    Anxiety    B12 deficiency    Back pain    Bipolar 1 disorder (HCC)    Bulging lumbar disc    C. difficile diarrhea 04/24/2016   treated with metronidazole   Constipation    Depression    GERD (gastroesophageal reflux disease)    History of chicken pox    History of shingles 2013   Hypertension    IBS (irritable bowel syndrome)    Joint pain    Major depressive disorder    Migraines    Obesity    Obsessive-compulsive disorder    Osteoarthritis    Prediabetes    PTSD (post-traumatic stress disorder)    Sleep apnea    SOB (shortness of breath)     Assessment: 46 yr old woman presented to the ED with CP, elevated troponin. Pharmacy was consulted to dose IV heparin (pt was not on anticoagulant PTA).  Initial heparin level ~6 hrs after heparin 4000 units IV bolus X 1, followed by heparin infusion at 1000 units/hr, was <0.10 units/ml, which is below the desired goal range. H/H 12.9/41.2, plt 453. Per RN, no issues with IV or bleeding  observed.  Goal of Therapy:  Heparin level 0.3-0.7 units/ml Monitor platelets by anticoagulation protocol: Yes   Plan:  Heparin bolus 2400 units IV x 1 Increase heparin infusion to 1250 units/hr Check 6-hr heparin level Monitor daily heparin level and CBC Monitor for bleeding F/U cardiology plans  Gillermina Hu, PharmD, BCPS, [facility] Clinical Pharmacist 12/27/2021,10:15 PM

## 2021-12-28 ENCOUNTER — Inpatient Hospital Stay (HOSPITAL_COMMUNITY): Payer: Medicare Other

## 2021-12-28 ENCOUNTER — Other Ambulatory Visit: Payer: Self-pay

## 2021-12-28 ENCOUNTER — Encounter (HOSPITAL_COMMUNITY)
Admission: EM | Disposition: A | Discharge status: Home / Self Care | Payer: Self-pay | Source: Home / Self Care | Attending: Cardiology

## 2021-12-28 DIAGNOSIS — I2510 Atherosclerotic heart disease of native coronary artery without angina pectoris: Secondary | ICD-10-CM

## 2021-12-28 DIAGNOSIS — I10 Essential (primary) hypertension: Secondary | ICD-10-CM

## 2021-12-28 DIAGNOSIS — E785 Hyperlipidemia, unspecified: Secondary | ICD-10-CM | POA: Diagnosis not present

## 2021-12-28 DIAGNOSIS — I214 Non-ST elevation (NSTEMI) myocardial infarction: Secondary | ICD-10-CM | POA: Diagnosis not present

## 2021-12-28 HISTORY — PX: LEFT HEART CATH AND CORONARY ANGIOGRAPHY: CATH118249

## 2021-12-28 HISTORY — PX: CORONARY STENT INTERVENTION: CATH118234

## 2021-12-28 LAB — BASIC METABOLIC PANEL
Anion gap: 9 (ref 5–15)
BUN: 9 mg/dL (ref 6–20)
CO2: 28 mmol/L (ref 22–32)
Calcium: 9.1 mg/dL (ref 8.9–10.3)
Chloride: 102 mmol/L (ref 98–111)
Creatinine, Ser: 1.01 mg/dL — ABNORMAL HIGH (ref 0.44–1.00)
GFR, Estimated: >60 mL/min (ref >60)
Glucose, Bld: 100 mg/dL — ABNORMAL HIGH (ref 70–99)
Potassium: 4 mmol/L (ref 3.5–5.1)
Sodium: 139 mmol/L (ref 135–145)

## 2021-12-28 LAB — CBC
HCT: 40.8 % (ref 36.0–46.0)
Hemoglobin: 13.1 g/dL (ref 12.0–15.0)
MCH: 28.1 pg (ref 26.0–34.0)
MCHC: 32.1 g/dL (ref 30.0–36.0)
MCV: 87.4 fL (ref 80.0–100.0)
Platelets: 467 10*3/uL — ABNORMAL HIGH (ref 150–400)
RBC: 4.67 MIL/uL (ref 3.87–5.11)
RDW: 15 % (ref 11.5–15.5)
WBC: 10.3 10*3/uL (ref 4.0–10.5)
nRBC: 0 % (ref 0.0–0.2)

## 2021-12-28 LAB — RAPID URINE DRUG SCREEN, HOSP PERFORMED
Amphetamines: NOT DETECTED
Barbiturates: NOT DETECTED
Benzodiazepines: NOT DETECTED
Cocaine: NOT DETECTED
Opiates: NOT DETECTED
Tetrahydrocannabinol: POSITIVE — AB

## 2021-12-28 LAB — ECHOCARDIOGRAM COMPLETE
AR max vel: 2.27 cm2
AV Area VTI: 2.47 cm2
AV Area mean vel: 2.44 cm2
AV Mean grad: 3 mmHg
AV Peak grad: 6.3 mmHg
Ao pk vel: 1.25 m/s
Area-P 1/2: 3.39 cm2
Height: 66 in
MV VTI: 2.15 cm2
S' Lateral: 2.9 cm
Weight: 3412.8 oz

## 2021-12-28 LAB — POCT ACTIVATED CLOTTING TIME: Activated Clotting Time: 299 seconds

## 2021-12-28 LAB — ABO/RH: ABO/RH(D): A POS

## 2021-12-28 LAB — HEPARIN LEVEL (UNFRACTIONATED): Heparin Unfractionated: 0.17 IU/mL — ABNORMAL LOW (ref 0.30–0.70)

## 2021-12-28 LAB — T4, FREE: Free T4: 1.01 ng/dL (ref 0.61–1.12)

## 2021-12-28 SURGERY — LEFT HEART CATH AND CORONARY ANGIOGRAPHY
Anesthesia: LOCAL

## 2021-12-28 MED ORDER — ENOXAPARIN SODIUM 40 MG/0.4ML IJ SOSY: 40.0000 mg | PREFILLED_SYRINGE | INTRAMUSCULAR | Status: DC

## 2021-12-28 MED ORDER — ROSUVASTATIN CALCIUM 20 MG PO TABS
40.0000 mg | ORAL_TABLET | Freq: Every day | ORAL | Status: DC
  Administered 2021-12-28: 40 mg via ORAL
  Filled 2021-12-28: qty 2

## 2021-12-28 MED ORDER — FENTANYL CITRATE (PF) 100 MCG/2ML IJ SOLN
INTRAMUSCULAR | Status: AC
  Filled 2021-12-28: qty 2

## 2021-12-28 MED ORDER — HEPARIN SODIUM (PORCINE) 1000 UNIT/ML IJ SOLN
INTRAMUSCULAR | Status: AC
  Filled 2021-12-28: qty 10

## 2021-12-28 MED ORDER — HEPARIN (PORCINE) IN NACL 1000-0.9 UT/500ML-% IV SOLN
INTRAVENOUS | Status: DC | PRN
  Administered 2021-12-28 (×2): 500 mL

## 2021-12-28 MED ORDER — LIDOCAINE HCL (PF) 1 % IJ SOLN
INTRAMUSCULAR | Status: AC
  Filled 2021-12-28: qty 30

## 2021-12-28 MED ORDER — LIDOCAINE HCL (PF) 1 % IJ SOLN
INTRAMUSCULAR | Status: DC | PRN
  Administered 2021-12-28: 2 mL

## 2021-12-28 MED ORDER — MIDAZOLAM HCL 2 MG/2ML IJ SOLN
INTRAMUSCULAR | Status: DC | PRN
  Administered 2021-12-28: 2 mg via INTRAVENOUS

## 2021-12-28 MED ORDER — SODIUM CHLORIDE 0.9% FLUSH: 3.0000 mL | INTRAVENOUS | Status: DC | PRN

## 2021-12-28 MED ORDER — TICAGRELOR 90 MG PO TABS
ORAL_TABLET | ORAL | Status: AC
  Filled 2021-12-28: qty 1

## 2021-12-28 MED ORDER — TICAGRELOR 90 MG PO TABS: 90.0000 mg | ORAL_TABLET | Freq: Two times a day (BID) | ORAL | Status: DC

## 2021-12-28 MED ORDER — SODIUM CHLORIDE 0.9 % WEIGHT BASED INFUSION: 1.0000 mL/kg/h | INTRAVENOUS | Status: DC

## 2021-12-28 MED ORDER — TICAGRELOR 90 MG PO TABS
ORAL_TABLET | ORAL | Status: DC | PRN
  Administered 2021-12-28: 180 mg via ORAL

## 2021-12-28 MED ORDER — HEPARIN SODIUM (PORCINE) 1000 UNIT/ML IJ SOLN
INTRAMUSCULAR | Status: DC | PRN
  Administered 2021-12-28 (×2): 5000 [IU] via INTRAVENOUS

## 2021-12-28 MED ORDER — ASPIRIN 81 MG PO CHEW: 81.0000 mg | CHEWABLE_TABLET | ORAL | Status: DC

## 2021-12-28 MED ORDER — MIDAZOLAM HCL 2 MG/2ML IJ SOLN
INTRAMUSCULAR | Status: AC
  Filled 2021-12-28: qty 2

## 2021-12-28 MED ORDER — HEPARIN BOLUS VIA INFUSION
2000.0000 [IU] | Freq: Once | INTRAVENOUS | Status: AC
  Administered 2021-12-28: 2000 [IU] via INTRAVENOUS
  Filled 2021-12-28: qty 2000

## 2021-12-28 MED ORDER — SODIUM CHLORIDE 0.9 % WEIGHT BASED INFUSION
1.0000 mL/kg/h | INTRAVENOUS | Status: AC
  Administered 2021-12-28: 1 mL/kg/h via INTRAVENOUS

## 2021-12-28 MED ORDER — IOHEXOL 350 MG/ML SOLN
INTRAVENOUS | Status: DC | PRN
  Administered 2021-12-28: 80 mL via INTRA_ARTERIAL

## 2021-12-28 MED ORDER — HEPARIN (PORCINE) IN NACL 1000-0.9 UT/500ML-% IV SOLN
INTRAVENOUS | Status: AC
  Filled 2021-12-28: qty 1000

## 2021-12-28 MED ORDER — TICAGRELOR 90 MG PO TABS
90.0000 mg | ORAL_TABLET | Freq: Once | ORAL | Status: AC
Start: 2021-12-29 — End: 2021-12-29
  Administered 2021-12-29: 90 mg via ORAL
  Filled 2021-12-28: qty 1

## 2021-12-28 MED ORDER — VERAPAMIL HCL 2.5 MG/ML IV SOLN
INTRAVENOUS | Status: AC
  Filled 2021-12-28: qty 2

## 2021-12-28 MED ORDER — SODIUM CHLORIDE 0.9 % WEIGHT BASED INFUSION
3.0000 mL/kg/h | INTRAVENOUS | Status: DC
  Administered 2021-12-28: 3 mL/kg/h via INTRAVENOUS

## 2021-12-28 MED ORDER — VERAPAMIL HCL 2.5 MG/ML IV SOLN
INTRAVENOUS | Status: DC | PRN
  Administered 2021-12-28: 10 mL via INTRA_ARTERIAL

## 2021-12-28 MED ORDER — NITROGLYCERIN 1 MG/10 ML FOR IR/CATH LAB
INTRA_ARTERIAL | Status: DC | PRN
  Administered 2021-12-28: 200 ug via INTRACORONARY

## 2021-12-28 MED ORDER — NITROGLYCERIN 1 MG/10 ML FOR IR/CATH LAB
INTRA_ARTERIAL | Status: AC
  Filled 2021-12-28: qty 10

## 2021-12-28 MED ORDER — ASPIRIN 81 MG PO CHEW
81.0000 mg | CHEWABLE_TABLET | ORAL | Status: AC
  Administered 2021-12-28: 81 mg via ORAL
  Filled 2021-12-28: qty 1

## 2021-12-28 MED ORDER — SODIUM CHLORIDE 0.9% FLUSH: 3.0000 mL | Freq: Two times a day (BID) | INTRAVENOUS | Status: DC

## 2021-12-28 MED ORDER — FENTANYL CITRATE (PF) 100 MCG/2ML IJ SOLN
INTRAMUSCULAR | Status: DC | PRN
  Administered 2021-12-28: 50 ug via INTRAVENOUS
  Administered 2021-12-28: 25 ug via INTRAVENOUS

## 2021-12-28 MED ORDER — SODIUM CHLORIDE 0.9 % IV SOLN: 250.0000 mL | INTRAVENOUS | Status: DC | PRN

## 2021-12-28 MED ORDER — SODIUM CHLORIDE 0.9 % WEIGHT BASED INFUSION: 3.0000 mL/kg/h | INTRAVENOUS | Status: DC

## 2021-12-28 MED ORDER — HYDRALAZINE HCL 20 MG/ML IJ SOLN: 10.0000 mg | INTRAMUSCULAR | Status: AC | PRN

## 2021-12-28 SURGICAL SUPPLY — 18 items
BALLN ~~LOC~~ EUPHORA RX 4.0X12 (BALLOONS) ×2
BALLOON ~~LOC~~ EUPHORA RX 4.0X12 (BALLOONS) IMPLANT
CATH 5FR JL3.5 JR4 ANG PIG MP (CATHETERS) ×1 IMPLANT
CATH OPTICROSS HD (CATHETERS) ×1 IMPLANT
CATH VISTA GUIDE 6FR XBLAD3.5 (CATHETERS) ×1 IMPLANT
DEVICE RAD COMP TR BAND LRG (VASCULAR PRODUCTS) ×1 IMPLANT
GLIDESHEATH SLEND SS 6F .021 (SHEATH) ×1 IMPLANT
GUIDEWIRE INQWIRE 1.5J.035X260 (WIRE) IMPLANT
INQWIRE 1.5J .035X260CM (WIRE) ×2
KIT ENCORE 26 ADVANTAGE (KITS) ×2 IMPLANT
KIT ESSENTIALS PG (KITS) ×1 IMPLANT
KIT HEART LEFT (KITS) ×2 IMPLANT
PACK CARDIAC CATHETERIZATION (CUSTOM PROCEDURE TRAY) ×2 IMPLANT
SLED PULL BACK IVUS (MISCELLANEOUS) ×1 IMPLANT
STENT ONYX FRONTIER 4.0X15 (Permanent Stent) ×1 IMPLANT
TRANSDUCER W/STOPCOCK (MISCELLANEOUS) ×2 IMPLANT
TUBING CIL FLEX 10 FLL-RA (TUBING) ×2 IMPLANT
WIRE ASAHI PROWATER 180CM (WIRE) ×1 IMPLANT

## 2021-12-28 NOTE — Progress Notes (Signed)
°  Echocardiogram 2D Echocardiogram has been performed.  Marybelle Killings 12/28/2021, 11:39 AM

## 2021-12-28 NOTE — Care Management (Signed)
°  Transition of Care [facility]) Screening Note   Patient Details  Name: Crystal Bean Date of Birth: 1976/10/09   Transition of Care [facility]) CM/SW Contact:    Bethena Roys, RN Phone Number: 12/28/2021, 10:17 AM    Transition of Care Department [facility]) has reviewed the patient and no TOC needs have been identified at this time. We will continue to monitor patient advancement through interdisciplinary progression rounds. If new patient transition needs arise, please place a TOC consult.

## 2021-12-28 NOTE — Interval H&P Note (Signed)
History and Physical Interval Note:  12/28/2021 2:57 PM  Sabrie L Zurcher  has presented today for surgery, with the diagnosis of nstemi.  The various methods of treatment have been discussed with the patient and family. After consideration of risks, benefits and other options for treatment, the patient has consented to  Procedure(s): LEFT HEART CATH AND CORONARY ANGIOGRAPHY (N/A) as a surgical intervention.  The patient's history has been reviewed, patient examined, no change in status, stable for surgery.  I have reviewed the patient's chart and labs.  Questions were answered to the patient's satisfaction.    Cath Lab Visit (complete for each Cath Lab visit)  Clinical Evaluation Leading to the Procedure:   ACS: Yes.    Non-ACS:    Anginal Classification: CCS IV  Anti-ischemic medical therapy: No Therapy  Non-Invasive Test Results: No non-invasive testing performed  Prior CABG: No previous CABG       Collier Salina [facility] 12/28/2021 2:57 PM

## 2021-12-28 NOTE — H&P (View-Only) (Signed)
Progress Note  Patient Name: Crystal Bean Date of Encounter: 12/28/2021  [facility] Cardiologist: New  Subjective   No CP or dyspnea  Inpatient Medications    Scheduled Meds:  ARIPiprazole  10 mg Oral Daily   aspirin EC  81 mg Oral Daily   buPROPion  300 mg Oral Daily   lisinopril  2.5 mg Oral Daily   metoprolol tartrate  12.5 mg Oral BID   multivitamin with minerals  1 tablet Oral Daily   pantoprazole  40 mg Oral Daily   rosuvastatin  20 mg Oral Daily   venlafaxine XR  300 mg Oral Q breakfast   vitamin B-12  100 mcg Oral Daily   Continuous Infusions:  heparin 1,250 Units/hr (12/28/21 0328)   PRN Meds: acetaminophen, nitroGLYCERIN   Vital Signs    Vitals:   12/27/21 1900 12/27/21 2048 12/27/21 2334 12/28/21 0424  BP: 121/83 (!) 135/104 (!) 132/93 (!) 105/54  Pulse: 71 71 72 64  Resp: 16 17  19   Temp:  (!) 97.5 F (36.4 C) 97.7 F (36.5 C) 97.7 F (36.5 C)  TempSrc:  Oral Oral Oral  SpO2: 99% 97% 99% 100%  Weight:  96.8 kg    Height:  5\' 6"  (1.676 m)     No intake or output data in the 24 hours ending 12/28/21 0727 Last 3 Weights 12/27/2021 12/27/2021 12/10/2021  Weight (lbs) 213 lb 4.8 oz 211 lb 211 lb  Weight (kg) 96.752 kg 95.709 kg 95.709 kg  Some encounter information is confidential and restricted. Go to Review Flowsheets activity to see all data.      Telemetry    Sinus - Personally Reviewed   Physical Exam   GEN: No acute distress.   Neck: No JVD Cardiac: RRR, no murmurs, rubs, or gallops.  Respiratory: Clear to auscultation bilaterally. GI: Soft, nontender, non-distended  MS: No edema Neuro:  Nonfocal  Psych: Normal affect   Labs    High Sensitivity Troponin:   Recent Labs  Lab 12/27/21 1157 12/27/21 1450  TROPONINIHS 34* 104*     Chemistry Recent Labs  Lab 12/27/21 1157 12/27/21 2144 12/28/21 0609  NA 137  --  139  K 4.1  --  4.0  CL 102  --  102  CO2 27  --  28  GLUCOSE 96  --  100*  BUN 15  --  9  CREATININE  1.02*  --  1.01*  CALCIUM 9.0  --  9.1  MG  --  2.0  --   PROT  --  6.9  --   ALBUMIN  --  3.5  --   AST  --  16  --   ALT  --  16  --   ALKPHOS  --  61  --   BILITOT  --  0.3  --   GFRNONAA >60  --  >60  ANIONGAP 8  --  9    Lipids  Recent Labs  Lab 12/27/21 1431  CHOL 165  TRIG 154*  HDL 36*  LDLCALC 98  CHOLHDL 4.6    Hematology Recent Labs  Lab 12/27/21 1157 12/28/21 0609  WBC 11.2* 10.3  RBC 4.71 4.67  HGB 12.9 13.1  HCT 41.2 40.8  MCV 87.5 87.4  MCH 27.4 28.1  MCHC 31.3 32.1  RDW 14.8 15.0  PLT 453* 467*   Thyroid  Recent Labs  Lab 12/27/21 2146  TSH 5.967*    BNP Recent Labs  Lab 12/27/21  2146  BNP 103.2*      Radiology    DG Chest 2 View  Result Date: 12/27/2021 CLINICAL DATA:  Epigastric pain radiating into the neck, left jaw, and right arm. EXAM: CHEST - 2 VIEW COMPARISON:  Chest two views 11/01/2020 FINDINGS: The heart size and mediastinal contours are within normal limits. Both lungs are clear. No pleural effusion or pneumothorax. The visualized skeletal structures are unremarkable. IMPRESSION: No active cardiopulmonary disease. Electronically Signed   By: Yvonne Kendall M.D.   On: 12/27/2021 12:27    Patient Profile     46 y.o. female with past medical history of bipolar disorder, hypertension, irritable bowel, prediabetes, sleep apnea admitted with non-ST elevation myocardial infarction.  Assessment & Plan    1 non-ST elevation myocardial infarction-troponin is mildly increased and symptoms are concerning.  Plan to proceed with cardiac catheterization today.  The risk and benefits including myocardial infarction, CVA and death discussed and she agrees to proceed.  Plan to continue aspirin, heparin, statin and metoprolol.  Schedule echocardiogram.  2 hyperlipidemia-given non-ST elevation myocardial infarction we will plan to increase Crestor to 40 mg daily.  Check lipids and liver in 8 weeks.  3 hypertension-continue metoprolol and  lisinopril and follow blood pressure.  For questions or updates, please contact [place] Please consult [email or web address] for contact info under        Signed, Kirk Ruths, MD  12/28/2021, 7:27 AM

## 2021-12-28 NOTE — Progress Notes (Signed)
ANTICOAGULATION CONSULT NOTE - Follow Up Consult  Pharmacy Consult for IV Heparin Indication: chest pain/ACS  Allergies  Allergen Reactions   Prednisone Other (See Comments)    Severe mood swings   Meloxicam Rash   Penicillins Rash    Patient Measurements: Height: 5\' 6"  (167.6 cm) Weight: 96.8 kg (213 lb 4.8 oz) IBW/kg (Calculated) : 59.3 Heparin Dosing Weight: 80.6 kg  Vital Signs: Temp: 97.7 F (36.5 C) (02/10 0424) Temp Source: Oral (02/10 0424) BP: 105/54 (02/10 0424) Pulse Rate: 64 (02/10 0424)  Labs: Recent Labs    12/27/21 1157 12/27/21 1450 12/27/21 2100 12/27/21 2144 12/28/21 0609  HGB 12.9  --   --   --  13.1  HCT 41.2  --   --   --  40.8  PLT 453*  --   --   --  467*  APTT  --   --   --  32  --   LABPROT  --   --   --  12.2  --   INR  --   --   --  0.9  --   HEPARINUNFRC  --   --  <0.10*  --  0.17*  CREATININE 1.02*  --   --   --  1.01*  TROPONINIHS 34* 104*  --   --   --     Estimated Creatinine Clearance: 81.6 mL/min (A) (by C-G formula based on SCr of 1.01 mg/dL (H)).   Medical History: Past Medical History:  Diagnosis Date   ADHD (attention deficit hyperactivity disorder)    Anxiety    B12 deficiency    Back pain    Bipolar 1 disorder (HCC)    Bulging lumbar disc    C. difficile diarrhea 04/24/2016   treated with metronidazole   Constipation    Depression    GERD (gastroesophageal reflux disease)    History of chicken pox    History of shingles 2013   Hypertension    IBS (irritable bowel syndrome)    Joint pain    Major depressive disorder    Migraines    Obesity    Obsessive-compulsive disorder    Osteoarthritis    Prediabetes    PTSD (post-traumatic stress disorder)    Sleep apnea    SOB (shortness of breath)     Assessment: 46 yr old woman presented to the ED with CP, elevated troponin. Pharmacy was consulted to dose IV heparin (pt was not on anticoagulant PTA). Plans noted for cardiac cath -heparin level= 0.17 on 1250  units/hr -CBC stable   Goal of Therapy:  Heparin level 0.3-0.7 units/ml Monitor platelets by anticoagulation protocol: Yes   Plan:  -heparin bolus 2000 units x1 and increase infusion to 1500 units/hr -Will follow plans post cath  Hildred Laser, PharmD Clinical Pharmacist **Pharmacist phone directory can now be found on [email or web address] ([email or web address]).  Listed under [place].

## 2021-12-28 NOTE — Progress Notes (Signed)
Progress Note  Patient Name: Crystal Bean Date of Encounter: 12/28/2021  [facility] Cardiologist: New  Subjective   No CP or dyspnea  Inpatient Medications    Scheduled Meds:  ARIPiprazole  10 mg Oral Daily   aspirin EC  81 mg Oral Daily   buPROPion  300 mg Oral Daily   lisinopril  2.5 mg Oral Daily   metoprolol tartrate  12.5 mg Oral BID   multivitamin with minerals  1 tablet Oral Daily   pantoprazole  40 mg Oral Daily   rosuvastatin  20 mg Oral Daily   venlafaxine XR  300 mg Oral Q breakfast   vitamin B-12  100 mcg Oral Daily   Continuous Infusions:  heparin 1,250 Units/hr (12/28/21 0328)   PRN Meds: acetaminophen, nitroGLYCERIN   Vital Signs    Vitals:   12/27/21 1900 12/27/21 2048 12/27/21 2334 12/28/21 0424  BP: 121/83 (!) 135/104 (!) 132/93 (!) 105/54  Pulse: 71 71 72 64  Resp: 16 17  19   Temp:  (!) 97.5 F (36.4 C) 97.7 F (36.5 C) 97.7 F (36.5 C)  TempSrc:  Oral Oral Oral  SpO2: 99% 97% 99% 100%  Weight:  96.8 kg    Height:  5\' 6"  (1.676 m)     No intake or output data in the 24 hours ending 12/28/21 0727 Last 3 Weights 12/27/2021 12/27/2021 12/10/2021  Weight (lbs) 213 lb 4.8 oz 211 lb 211 lb  Weight (kg) 96.752 kg 95.709 kg 95.709 kg  Some encounter information is confidential and restricted. Go to Review Flowsheets activity to see all data.      Telemetry    Sinus - Personally Reviewed   Physical Exam   GEN: No acute distress.   Neck: No JVD Cardiac: RRR, no murmurs, rubs, or gallops.  Respiratory: Clear to auscultation bilaterally. GI: Soft, nontender, non-distended  MS: No edema Neuro:  Nonfocal  Psych: Normal affect   Labs    High Sensitivity Troponin:   Recent Labs  Lab 12/27/21 1157 12/27/21 1450  TROPONINIHS 34* 104*     Chemistry Recent Labs  Lab 12/27/21 1157 12/27/21 2144 12/28/21 0609  NA 137  --  139  K 4.1  --  4.0  CL 102  --  102  CO2 27  --  28  GLUCOSE 96  --  100*  BUN 15  --  9  CREATININE  1.02*  --  1.01*  CALCIUM 9.0  --  9.1  MG  --  2.0  --   PROT  --  6.9  --   ALBUMIN  --  3.5  --   AST  --  16  --   ALT  --  16  --   ALKPHOS  --  61  --   BILITOT  --  0.3  --   GFRNONAA >60  --  >60  ANIONGAP 8  --  9    Lipids  Recent Labs  Lab 12/27/21 1431  CHOL 165  TRIG 154*  HDL 36*  LDLCALC 98  CHOLHDL 4.6    Hematology Recent Labs  Lab 12/27/21 1157 12/28/21 0609  WBC 11.2* 10.3  RBC 4.71 4.67  HGB 12.9 13.1  HCT 41.2 40.8  MCV 87.5 87.4  MCH 27.4 28.1  MCHC 31.3 32.1  RDW 14.8 15.0  PLT 453* 467*   Thyroid  Recent Labs  Lab 12/27/21 2146  TSH 5.967*    BNP Recent Labs  Lab 12/27/21  2146  BNP 103.2*      Radiology    DG Chest 2 View  Result Date: 12/27/2021 CLINICAL DATA:  Epigastric pain radiating into the neck, left jaw, and right arm. EXAM: CHEST - 2 VIEW COMPARISON:  Chest two views 11/01/2020 FINDINGS: The heart size and mediastinal contours are within normal limits. Both lungs are clear. No pleural effusion or pneumothorax. The visualized skeletal structures are unremarkable. IMPRESSION: No active cardiopulmonary disease. Electronically Signed   By: Yvonne Kendall M.D.   On: 12/27/2021 12:27    Patient Profile     46 y.o. female with past medical history of bipolar disorder, hypertension, irritable bowel, prediabetes, sleep apnea admitted with non-ST elevation myocardial infarction.  Assessment & Plan    1 non-ST elevation myocardial infarction-troponin is mildly increased and symptoms are concerning.  Plan to proceed with cardiac catheterization today.  The risk and benefits including myocardial infarction, CVA and death discussed and she agrees to proceed.  Plan to continue aspirin, heparin, statin and metoprolol.  Schedule echocardiogram.  2 hyperlipidemia-given non-ST elevation myocardial infarction we will plan to increase Crestor to 40 mg daily.  Check lipids and liver in 8 weeks.  3 hypertension-continue metoprolol and  lisinopril and follow blood pressure.  For questions or updates, please contact [place] Please consult [email or web address] for contact info under        Signed, Kirk Ruths, MD  12/28/2021, 7:27 AM

## 2021-12-29 DIAGNOSIS — I214 Non-ST elevation (NSTEMI) myocardial infarction: Secondary | ICD-10-CM | POA: Diagnosis not present

## 2021-12-29 DIAGNOSIS — E7800 Pure hypercholesterolemia, unspecified: Secondary | ICD-10-CM | POA: Diagnosis not present

## 2021-12-29 DIAGNOSIS — I10 Essential (primary) hypertension: Secondary | ICD-10-CM | POA: Diagnosis not present

## 2021-12-29 DIAGNOSIS — I2583 Coronary atherosclerosis due to lipid rich plaque: Secondary | ICD-10-CM

## 2021-12-29 DIAGNOSIS — I2510 Atherosclerotic heart disease of native coronary artery without angina pectoris: Secondary | ICD-10-CM | POA: Diagnosis not present

## 2021-12-29 LAB — CBC
HCT: 40.2 % (ref 36.0–46.0)
Hemoglobin: 12.5 g/dL (ref 12.0–15.0)
MCH: 27.7 pg (ref 26.0–34.0)
MCHC: 31.1 g/dL (ref 30.0–36.0)
MCV: 88.9 fL (ref 80.0–100.0)
Platelets: 391 10*3/uL (ref 150–400)
RBC: 4.52 MIL/uL (ref 3.87–5.11)
RDW: 14.9 % (ref 11.5–15.5)
WBC: 10.8 10*3/uL — ABNORMAL HIGH (ref 4.0–10.5)
nRBC: 0 % (ref 0.0–0.2)

## 2021-12-29 LAB — BASIC METABOLIC PANEL
Anion gap: 9 (ref 5–15)
BUN: 5 mg/dL — ABNORMAL LOW (ref 6–20)
CO2: 24 mmol/L (ref 22–32)
Calcium: 8.9 mg/dL (ref 8.9–10.3)
Chloride: 107 mmol/L (ref 98–111)
Creatinine, Ser: 1.03 mg/dL — ABNORMAL HIGH (ref 0.44–1.00)
GFR, Estimated: >60 mL/min (ref >60)
Glucose, Bld: 88 mg/dL (ref 70–99)
Potassium: 3.8 mmol/L (ref 3.5–5.1)
Sodium: 140 mmol/L (ref 135–145)

## 2021-12-29 MED ORDER — ASPIRIN 81 MG PO TBEC: 81.0000 mg | DELAYED_RELEASE_TABLET | Freq: Every day | ORAL | 11 refills | Status: AC

## 2021-12-29 MED ORDER — CLOPIDOGREL BISULFATE 75 MG PO TABS: 75.0000 mg | ORAL_TABLET | Freq: Every day | ORAL | Status: DC

## 2021-12-29 MED ORDER — CLOPIDOGREL BISULFATE 75 MG PO TABS
300.0000 mg | ORAL_TABLET | Freq: Once | ORAL | Status: AC
  Administered 2021-12-29: 300 mg via ORAL
  Filled 2021-12-29: qty 4

## 2021-12-29 MED ORDER — ACETAMINOPHEN 325 MG PO TABS: 650.0000 mg | ORAL_TABLET | ORAL | Status: DC | PRN

## 2021-12-29 MED ORDER — NITROGLYCERIN 0.4 MG SL SUBL: 0.4000 mg | SUBLINGUAL_TABLET | SUBLINGUAL | 12 refills | Status: DC | PRN

## 2021-12-29 MED ORDER — METOPROLOL SUCCINATE ER 25 MG PO TB24: 25.0000 mg | ORAL_TABLET | Freq: Every day | ORAL | Status: DC

## 2021-12-29 MED ORDER — CLOPIDOGREL BISULFATE 75 MG PO TABS
75.0000 mg | ORAL_TABLET | Freq: Every day | ORAL | 11 refills | Status: DC
Start: 2021-12-29 — End: 2022-10-31

## 2021-12-29 MED ORDER — ROSUVASTATIN CALCIUM 40 MG PO TABS: 40.0000 mg | ORAL_TABLET | Freq: Every day | ORAL | 6 refills | Status: DC

## 2021-12-29 MED ORDER — CLOPIDOGREL BISULFATE 75 MG PO TABS: 600.0000 mg | ORAL_TABLET | Freq: Once | ORAL | Status: DC

## 2021-12-29 MED ORDER — LISINOPRIL 2.5 MG PO TABS: 2.5000 mg | ORAL_TABLET | Freq: Every day | ORAL | 6 refills | Status: DC

## 2021-12-29 MED ORDER — METOPROLOL SUCCINATE ER 25 MG PO TB24: 25.0000 mg | ORAL_TABLET | Freq: Every day | ORAL | 6 refills | Status: DC

## 2021-12-29 NOTE — TOC CM/SW Note (Addendum)
Made aware by Mrs. Wenig's nurse that there is a concern about the affordability of Brilinta as Mrs. Brugger does not have Medicare Part D benefits. Spoke with Mrs. Marrocco to make aware that writer is unable to provide [facility] letter due to her having insurance but unfortunately chose to opt out of offered Part D benefits. Discussed that writer will follow up with her [facility] pharmacy to see if they have any other suggestions or if they can provide any assistance.  Telephone call made to [facility] [phone]. Spoke with Pharmacy tech who confirmed with HT Pharmacist that there are no patient assistance programs unless patient goes thru [facility]. Discussed that Probation officer printed out goodrx coupon which would be for 452.83.  Discussed situation with TOC oncall supervisor.  Made Mrs. Gomm aware of all of the above information. Mrs. Sinatra indicates she will be unable to afford Brilinta out of pocket.   Made MD aware. Patient will be on Plavix instead of Brilinta. Provided Mrs. Kellie Simmering with goodrx coupon for Plavix at [facility] with range of 15 to 20 dollars as opposed to 452.00 out of pocket. Encouraged Mrs. Dahlem to enroll in Medicare Part D prescription benefit.   Marthenia Rolling, MSN, RN,BSN [facility] Case Manager [phone]

## 2021-12-29 NOTE — Progress Notes (Signed)
CARDIAC REHAB PHASE I   PRE:  Rate/Rhythm: 75 SR  BP:  Supine:   Sitting: 129/92  Standing:    SaO2: 99 RA  MODE:  Ambulation: 790 ft   POST:  Rate/Rhythm: 90 SR  BP:  Supine:   Sitting: 147/77  Standing:    SaO2: 97 RA [id number]  Pt tolerated ambulation well without c/o of cp or SOB> vs stable. Pt to side of bed after walk with call light in reach. Completed MI and stent education with pt. We discussed MI, and given MI booklet,Stent , stent card, risk factors, modifications, heart healthy, diabetic diet, exercise guidelines, Brilinta, ASA, proper use of NTG, calling MD and 911.Also discussed Outpt.CR program. Pt voices understanding. Will send referral to [facility]. CRP. Pt given Brilinta discount card and encouraged her to call the drug company to see if she can get assistance with the medication. She has no medication insurance plan.   Rodney Langton RN 12/29/2021 9:19 AM

## 2021-12-29 NOTE — Progress Notes (Addendum)
Progress Note  Patient Name: Crystal Bean Date of Encounter: 12/29/2021  [facility] Cardiologist: New  Subjective   Has any chest pain or shortness of breath.  Cath yesterday demonstrated 80% proximal LAD status post PCI with normal LV function normal LVEDP.  She will need to be on DAPT for a year  Inpatient Medications    Scheduled Meds:  ARIPiprazole  10 mg Oral Daily   aspirin EC  81 mg Oral Daily   buPROPion  300 mg Oral Daily   enoxaparin (LOVENOX) injection  40 mg Subcutaneous Q24H   lisinopril  2.5 mg Oral Daily   metoprolol tartrate  12.5 mg Oral BID   multivitamin with minerals  1 tablet Oral Daily   pantoprazole  40 mg Oral Daily   rosuvastatin  40 mg Oral Daily   sodium chloride flush  3 mL Intravenous Q12H   sodium chloride flush  3 mL Intravenous Q12H   ticagrelor  90 mg Oral BID   venlafaxine XR  300 mg Oral Q breakfast   vitamin B-12  100 mcg Oral Daily   Continuous Infusions:  sodium chloride     PRN Meds: sodium chloride, acetaminophen, nitroGLYCERIN, sodium chloride flush   Vital Signs    Vitals:   12/28/21 1740 12/28/21 2018 12/29/21 0412 12/29/21 0800  BP: (!) 109/97 132/84 129/77   Pulse: 66 77 65 68  Resp:  18 18   Temp:  98.5 F (36.9 C) 98.1 F (36.7 C)   TempSrc:  Oral Oral   SpO2: 98% 99% 97% 97%  Weight:      Height:        Intake/Output Summary (Last 24 hours) at 12/29/2021 1046 Last data filed at 12/28/2021 1430 Gross per 24 hour  Intake 745.15 ml  Output --  Net 745.15 ml   Last 3 Weights 12/27/2021 12/27/2021 12/10/2021  Weight (lbs) 213 lb 4.8 oz 211 lb 211 lb  Weight (kg) 96.752 kg 95.709 kg 95.709 kg  Some encounter information is confidential and restricted. Go to Review Flowsheets activity to see all data.      Telemetry    Normal sinus rhythm- Personally Reviewed   Physical Exam   GEN: Well nourished, well developed in no acute distress HEENT: Normal NECK: No JVD; No carotid bruits LYMPHATICS: No  lymphadenopathy CARDIAC:RRR, no murmurs, rubs, gallops RESPIRATORY:  Clear to auscultation without rales, wheezing or rhonchi  ABDOMEN: Soft, non-tender, non-distended MUSCULOSKELETAL:  No edema; No deformity  SKIN: Warm and dry.  Stable right radial artery cath site with no ecchymosis or hematoma. NEUROLOGIC:  Alert and oriented x 3 PSYCHIATRIC:  Normal affect   Labs    High Sensitivity Troponin:   Recent Labs  Lab 12/27/21 1157 12/27/21 1450  TROPONINIHS 34* 104*      Chemistry Recent Labs  Lab 12/27/21 1157 12/27/21 2144 12/28/21 0609 12/29/21 0240  NA 137  --  139 140  K 4.1  --  4.0 3.8  CL 102  --  102 107  CO2 27  --  28 24  GLUCOSE 96  --  100* 88  BUN 15  --  9 5*  CREATININE 1.02*  --  1.01* 1.03*  CALCIUM 9.0  --  9.1 8.9  MG  --  2.0  --   --   PROT  --  6.9  --   --   ALBUMIN  --  3.5  --   --   AST  --  16  --   --   ALT  --  16  --   --   ALKPHOS  --  61  --   --   BILITOT  --  0.3  --   --   GFRNONAA >60  --  >60 >60  ANIONGAP 8  --  9 9     Lipids  Recent Labs  Lab 12/27/21 1431  CHOL 165  TRIG 154*  HDL 36*  LDLCALC 98  CHOLHDL 4.6     Hematology Recent Labs  Lab 12/27/21 1157 12/28/21 0609 12/29/21 0240  WBC 11.2* 10.3 10.8*  RBC 4.71 4.67 4.52  HGB 12.9 13.1 12.5  HCT 41.2 40.8 40.2  MCV 87.5 87.4 88.9  MCH 27.4 28.1 27.7  MCHC 31.3 32.1 31.1  RDW 14.8 15.0 14.9  PLT 453* 467* 391    Thyroid  Recent Labs  Lab 12/27/21 2146 12/28/21 0609  TSH 5.967*  --   FREET4  --  1.01     BNP Recent Labs  Lab 12/27/21 2146  BNP 103.2*       Radiology    DG Chest 2 View  Result Date: 12/27/2021 CLINICAL DATA:  Epigastric pain radiating into the neck, left jaw, and right arm. EXAM: CHEST - 2 VIEW COMPARISON:  Chest two views 11/01/2020 FINDINGS: The heart size and mediastinal contours are within normal limits. Both lungs are clear. No pleural effusion or pneumothorax. The visualized skeletal structures are unremarkable.  IMPRESSION: No active cardiopulmonary disease. Electronically Signed   By: Yvonne Kendall M.D.   On: 12/27/2021 12:27   CARDIAC CATHETERIZATION  Result Date: 12/28/2021   Prox LAD lesion is 80% stenosed.   A drug-eluting stent was successfully placed using a STENT ONYX FRONTIER 4.0X15.   Post intervention, there is a 0% residual stenosis.   The left ventricular systolic function is normal.   LV end diastolic pressure is normal.   The left ventricular ejection fraction is 55-65% by visual estimate. 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   Successful PCI of the proximal LAD with IVUS guidance and DES x 1. Plan: DAPT for one year. Risk factor modification. Anticipate DC in am.   ECHOCARDIOGRAM COMPLETE  Result Date: 12/28/2021    ECHOCARDIOGRAM REPORT   Patient Name:   Crystal Bean Date of Exam: 12/28/2021 Medical Rec #:  [id number]     Height:       66.0 in Accession #:    [id number]    Weight:       213.3 lb Date of Birth:  01/05/1976    BSA:          2.055 m Patient Age:    46 years      BP:           129/95 mmHg Patient Gender: F             HR:           61 bpm. Exam Location:  Inpatient Procedure: 2D Echo, Cardiac Doppler and Color Doppler Indications:    NSTEMI  History:        Patient has no prior history of Echocardiogram examinations.                 Risk Factors:Hypertension and Dyslipidemia.  Sonographer:    Clayton Lefort RDCS (AE) Referring Phys: [id number] [place]  1. Left ventricular ejection fraction, by estimation, is 50 to 55%.  The left ventricle has low normal function. The left ventricle demonstrates regional wall motion abnormalities (see scoring diagram/findings for description). There is mild concentric left ventricular hypertrophy. Left ventricular diastolic parameters are consistent with Grade II diastolic dysfunction (pseudonormalization). Elevated left atrial pressure.  2. Right ventricular systolic function is normal.  The right ventricular size is normal. Tricuspid regurgitation signal is inadequate for assessing PA pressure.  3. Left atrial size was mildly dilated.  4. The mitral valve is normal in structure. Mild mitral valve regurgitation.  5. The aortic valve is tricuspid. Aortic valve regurgitation is not visualized. No aortic stenosis is present.  6. The inferior vena cava is normal in size with greater than 50% respiratory variability, suggesting right atrial pressure of 3 mmHg. FINDINGS  Left Ventricle: The inferolateral wall is suboptimally visualized. COnsider repeat study with Definity. Left ventricular ejection fraction, by estimation, is 50 to 55%. The left ventricle has low normal function. The left ventricle demonstrates regional  wall motion abnormalities. The left ventricular internal cavity size was normal in size. There is mild concentric left ventricular hypertrophy. Left ventricular diastolic parameters are consistent with Grade II diastolic dysfunction (pseudonormalization). Elevated left atrial pressure. Right Ventricle: The right ventricular size is normal. No increase in right ventricular wall thickness. Right ventricular systolic function is normal. Tricuspid regurgitation signal is inadequate for assessing PA pressure. Left Atrium: Left atrial size was mildly dilated. Right Atrium: Right atrial size was normal in size. Pericardium: There is no evidence of pericardial effusion. Mitral Valve: The mitral valve is normal in structure. Mild mitral valve regurgitation. MV peak gradient, 4.1 mmHg. The mean mitral valve gradient is 2.0 mmHg. Tricuspid Valve: The tricuspid valve is normal in structure. Tricuspid valve regurgitation is not demonstrated. Aortic Valve: The aortic valve is tricuspid. Aortic valve regurgitation is not visualized. No aortic stenosis is present. Aortic valve mean gradient measures 3.0 mmHg. Aortic valve peak gradient measures 6.2 mmHg. Aortic valve area, by VTI measures 2.47 cm.  Pulmonic Valve: The pulmonic valve was not well visualized. Pulmonic valve regurgitation is not visualized. Aorta: The aortic root and ascending aorta are structurally normal, with no evidence of dilitation. Venous: The inferior vena cava is normal in size with greater than 50% respiratory variability, suggesting right atrial pressure of 3 mmHg. IAS/Shunts: No atrial level shunt detected by color flow Doppler.  LEFT VENTRICLE PLAX 2D LVIDd:         3.90 cm   Diastology LVIDs:         2.90 cm   LV e' medial:    6.90 cm/s LV PW:         1.28 cm   LV E/e' medial:  15.7 LV IVS:        1.23 cm   LV e' lateral:   9.30 cm/s LVOT diam:     2.00 cm   LV E/e' lateral: 11.6 LV SV:         63 LV SV Index:   31 LVOT Area:     3.14 cm  RIGHT VENTRICLE             IVC RV Basal diam:  3.20 cm     IVC diam: 1.70 cm RV S prime:     13.20 cm/s TAPSE (M-mode): 2.6 cm LEFT ATRIUM             Index        RIGHT ATRIUM           Index LA diam:  3.90 cm 1.90 cm/m   RA Area:     13.20 cm LA Vol (A2C):   47.7 ml 23.21 ml/m  RA Volume:   30.60 ml  14.89 ml/m LA Vol (A4C):   32.5 ml 15.81 ml/m LA Biplane Vol: 40.1 ml 19.51 ml/m  AORTIC VALVE AV Area (Vmax):    2.27 cm AV Area (Vmean):   2.44 cm AV Area (VTI):     2.47 cm AV Vmax:           125.00 cm/s AV Vmean:          77.000 cm/s AV VTI:            0.256 m AV Peak Grad:      6.2 mmHg AV Mean Grad:      3.0 mmHg LVOT Vmax:         90.20 cm/s LVOT Vmean:        59.900 cm/s LVOT VTI:          0.201 m LVOT/AV VTI ratio: 0.79  AORTA Ao Root diam: 3.50 cm Ao Asc diam:  3.30 cm MITRAL VALVE MV Area (PHT): 3.39 cm     SHUNTS MV Area VTI:   2.15 cm     Systemic VTI:  0.20 m MV Peak grad:  4.1 mmHg     Systemic Diam: 2.00 cm MV Mean grad:  2.0 mmHg MV Vmax:       1.01 m/s MV Vmean:      62.0 cm/s MV Decel Time: 224 msec MV E velocity: 108.00 cm/s MV A velocity: 83.60 cm/s MV E/A ratio:  1.29 Mihai Croitoru MD Electronically signed by Sanda Klein MD Signature Date/Time:  12/28/2021/3:23:18 PM    Final     Patient Profile     46 y.o. female with past medical history of bipolar disorder, hypertension, irritable bowel, prediabetes, sleep apnea admitted with non-ST elevation myocardial infarction.  Assessment & Plan    1 non-ST elevation myocardial infarction -troponin is mildly increased at 34 >104 -Cath yesterday demonstrated 80% proximal LAD status post PCI with normal LV function normal LVEDP.   -She will need to be on DAPT with aspirin and Plavix for 1 year normal sinus rhythm   -2D echo on 12/28/2021 showed normal LV function at 50 to 55% with mild LVH, grade 2 diastolic dysfunction and mild MR -Continue aspirin 81 mg daily, Crestor 40 mg daily -Currently she is on Brilinta 90 mg twice daily but informing that she will not be able to afford the Brilinta.  We will be more than $450 out-of-pocket monthly.  Case management has been working with this and feels that Plavix is only option for her to be able to afford -Discussed with pharmacy and will stop Brilinta and start Plavix.  She will get a load of Plavix 600 mg today and then starting tomorrow she will start Plavix 75 mg daily -Change Lopressor to Toprol-XL 25 mg daily  2 hyperlipidemia -Crestor was increased to 40 mg daily this hospitalization -Repeat FLP and ALT in 8 weeks   3 hypertension -BP is controlled on exam today -Change Lopressor to Toprol-XL 25 mg daily and continue lisinopril 2.5 mg daily.  She is stable from discharge from a cardiac standpoint she is stable and ready for discharge.  She will follow-up with Dr. Johney Frame outpatient.  For questions or updates, please contact [place] Please consult [email or web address] for contact info under        Signed, Fransico Him, MD  12/29/2021, 10:46 AM

## 2021-12-29 NOTE — Discharge Summary (Signed)
Discharge Summary    Patient ID: Crystal Bean MRN: [id number]; DOB: 05/15/1976  Admit date: 12/27/2021 Discharge date: 12/29/2021  PCP:  Inda Coke, [place] [facility] Cardiologist:  Kirk Ruths, MD        Discharge Diagnoses    Principal Problem:   NSTEMI (non-ST elevated myocardial infarction) [facility])    Diagnostic Studies/Procedures    CARDIAC CATH: 12/28/2021   Prox LAD lesion is 80% stenosed.   A drug-eluting stent was successfully placed using a STENT ONYX FRONTIER 4.0X15.   Post intervention, there is a 0% residual stenosis.   The left ventricular systolic function is normal.   LV end diastolic pressure is normal.   The left ventricular ejection fraction is 55-65% by visual estimate.   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   Successful PCI of the proximal LAD with IVUS guidance and DES x 1.   Intervention    ECHO: 12/28/2021  1. Left ventricular ejection fraction, by estimation, is 50 to 55%. The  left ventricle has low normal function. The left ventricle demonstrates  regional wall motion abnormalities (see scoring diagram/findings for  description). There is mild concentric left ventricular hypertrophy. Left ventricular diastolic parameters are consistent with Grade II diastolic dysfunction (pseudonormalization). Elevated left atrial pressure.   2. Right ventricular systolic function is normal. The right ventricular  size is normal. Tricuspid regurgitation signal is inadequate for assessing  PA pressure.   3. Left atrial size was mildly dilated.   4. The mitral valve is normal in structure. Mild mitral valve  regurgitation.   5. The aortic valve is tricuspid. Aortic valve regurgitation is not  visualized. No aortic stenosis is present.   6. The inferior vena cava is normal in size with greater than 50%  respiratory variability, suggesting right atrial pressure of 3 mmHg.   _____________   History of Present Illness     Crystal Bean is a 46 y.o. female with a history of  Bipolar 1 disorder, depression, anxiety, ADHD, HTN, prediabetes and obesity, who was admitted 02/09 with a non-STEMI.  Hospital Course     Consultants: None  She was initially treated with ASA 324 mg, heparin, and started on a beta-blocker as well as lisinopril.  Her statin was increased to high-dose statin, lipid profile as below.  She needs a lipid and liver check in 8 weeks.  She was taken to the Cath Lab on 2/10, results are above.  She had single-vessel disease with a significant LAD lesion, which was treated with a DES.  She tolerated the procedure well.  Her EF was low normal by echo, with a wall motion abnormality and grade 2 diastolic dysfunction.  Her BNP was normal, no CHF.  Her TSH was mildly elevated, but her free T4 was normal, follow-up as an outpatient.  She needs to follow-up with PCP.  On 02/11, she was seen by Dr. Radford Pax and all data were reviewed.  She was seen by cardiac rehab and ambulated without chest pain or shortness of breath.  She was educated on healthy lifestyle, compliance with medications and proper use of nitroglycerin.  She will follow-up with rehab as an outpatient.  She was initially started on Brilinta, but the out-of-pocket cost will be over $450.  She is being changed to Plavix with an appropriate loading dose.  Because of the aspirin and Plavix, her Voltaren was stopped.  She can take Tylenol.  Discussed this  in follow-up.   Did the patient have an acute coronary syndrome (MI, NSTEMI, STEMI, etc) this admission?:  Yes                               AHA/ACC Clinical Performance & Quality Measures: Aspirin prescribed? - Yes ADP Receptor Inhibitor (Plavix/Clopidogrel, Brilinta/Ticagrelor or Effient/Prasugrel) prescribed (includes medically managed patients)? - Yes Beta Blocker prescribed? - Yes High Intensity Statin (Lipitor 40-80mg  or Crestor  20-40mg ) prescribed? - Yes EF assessed during THIS hospitalization? - Yes For EF <40%, was ACEI/ARB prescribed? - Not Applicable (EF >/= 69%) For EF <40%, Aldosterone Antagonist (Spironolactone or Eplerenone) prescribed? - Not Applicable (EF >/= 62%) Cardiac Rehab Phase II ordered (including medically managed patients)? - Yes       The patient will be scheduled for a TOC follow up appointment in 14 days.  A message has been sent to the [facility] and Scheduling Pool at the office where the patient should be seen for follow up.  _____________  Discharge Vitals Blood pressure 129/77, pulse 68, temperature 98.1 F (36.7 C), temperature source Oral, resp. rate 18, height 5\' 6"  (1.676 m), weight 96.8 kg, SpO2 97 %.  Filed Weights   12/27/21 1419 12/27/21 2048  Weight: 95.7 kg 96.8 kg    Labs & Radiologic Studies    CBC Recent Labs    12/28/21 0609 12/29/21 0240  WBC 10.3 10.8*  HGB 13.1 12.5  HCT 40.8 40.2  MCV 87.4 88.9  PLT 467* 952   Basic Metabolic Panel Recent Labs    12/27/21 2144 12/28/21 0609 12/29/21 0240  NA  --  139 140  K  --  4.0 3.8  CL  --  102 107  CO2  --  28 24  GLUCOSE  --  100* 88  BUN  --  9 5*  CREATININE  --  1.01* 1.03*  CALCIUM  --  9.1 8.9  MG 2.0  --   --    Liver Function Tests Recent Labs    12/27/21 2144  AST 16  ALT 16  ALKPHOS 61  BILITOT 0.3  PROT 6.9  ALBUMIN 3.5   No results for input(s): LIPASE, AMYLASE in the last 72 hours. High Sensitivity Troponin:   Recent Labs  Lab 12/27/21 1157 12/27/21 1450  TROPONINIHS 34* 104*    BNP Invalid input(s): POCBNP D-Dimer No results for input(s): DDIMER in the last 72 hours. Hemoglobin A1C Recent Labs    12/27/21 2146  HGBA1C 5.2   Fasting Lipid Panel Recent Labs    12/27/21 1431  CHOL 165  HDL 36*  LDLCALC 98  TRIG 154*  CHOLHDL 4.6   Thyroid Function Tests Recent Labs    12/27/21 2146  TSH 5.967*   Free T4  Date Value Ref Range Status  12/28/2021 1.01 0.61  - 1.12 ng/dL Final    Comment:    (NOTE) Biotin ingestion may interfere with free T4 tests. If the results are inconsistent with the TSH level, previous test results, or the clinical presentation, then consider biotin interference. If needed, order repeat testing after stopping biotin. Performed at [facility], [place] [address] [place], [place] [address]    Drugs of Abuse     Component Value Date/Time   LABOPIA NONE DETECTED 12/28/2021 0134   COCAINSCRNUR NONE DETECTED 12/28/2021 0134   LABBENZ NONE DETECTED 12/28/2021 0134   AMPHETMU NONE DETECTED 12/28/2021 0134  THCU POSITIVE (A) 12/28/2021 0134   LABBARB NONE DETECTED 12/28/2021 0134     _____________  DG Chest 2 View  Result Date: 12/27/2021 CLINICAL DATA:  Epigastric pain radiating into the neck, left jaw, and right arm. EXAM: CHEST - 2 VIEW COMPARISON:  Chest two views 11/01/2020 FINDINGS: The heart size and mediastinal contours are within normal limits. Both lungs are clear. No pleural effusion or pneumothorax. The visualized skeletal structures are unremarkable. IMPRESSION: No active cardiopulmonary disease. Electronically Signed   By: Yvonne Kendall M.D.   On: 12/27/2021 12:27   CARDIAC CATHETERIZATION  Result Date: 12/28/2021   Prox LAD lesion is 80% stenosed.   A drug-eluting stent was successfully placed using a STENT ONYX FRONTIER 4.0X15.   Post intervention, there is a 0% residual stenosis.   The left ventricular systolic function is normal.   LV end diastolic pressure is normal.   The left ventricular ejection fraction is 55-65% by visual estimate. 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   Successful PCI of the proximal LAD with IVUS guidance and DES x 1. Plan: DAPT for one year. Risk factor modification. Anticipate DC in am.   ECHOCARDIOGRAM COMPLETE  Result Date: 12/28/2021    ECHOCARDIOGRAM REPORT   Patient Name:   Crystal Bean Date of Exam: 12/28/2021  Medical Rec #:  [id number]     Height:       66.0 in Accession #:    [id number]    Weight:       213.3 lb Date of Birth:  [birth year]    BSA:          2.055 m Patient Age:    61 years      BP:           129/95 mmHg Patient Gender: F             HR:           61 bpm. Exam Location:  Inpatient Procedure: 2D Echo, Cardiac Doppler and Color Doppler Indications:    NSTEMI  History:        Patient has no prior history of Echocardiogram examinations.                 Risk Factors:Hypertension and Dyslipidemia.  Sonographer:    Clayton Lefort RDCS (AE) Referring Phys: [id number] [place]  1. Left ventricular ejection fraction, by estimation, is 50 to 55%. The left ventricle has low normal function. The left ventricle demonstrates regional wall motion abnormalities (see scoring diagram/findings for description). There is mild concentric left ventricular hypertrophy. Left ventricular diastolic parameters are consistent with Grade II diastolic dysfunction (pseudonormalization). Elevated left atrial pressure.  2. Right ventricular systolic function is normal. The right ventricular size is normal. Tricuspid regurgitation signal is inadequate for assessing PA pressure.  3. Left atrial size was mildly dilated.  4. The mitral valve is normal in structure. Mild mitral valve regurgitation.  5. The aortic valve is tricuspid. Aortic valve regurgitation is not visualized. No aortic stenosis is present.  6. The inferior vena cava is normal in size with greater than 50% respiratory variability, suggesting right atrial pressure of 3 mmHg. FINDINGS  Left Ventricle: The inferolateral wall is suboptimally visualized. COnsider repeat study with Definity. Left ventricular ejection fraction, by estimation, is 50 to 55%. The left ventricle has low normal function. The left ventricle demonstrates regional  wall motion abnormalities. The left ventricular internal cavity size was normal  in size. There is mild concentric left ventricular  hypertrophy. Left ventricular diastolic parameters are consistent with Grade II diastolic dysfunction (pseudonormalization). Elevated left atrial pressure. Right Ventricle: The right ventricular size is normal. No increase in right ventricular wall thickness. Right ventricular systolic function is normal. Tricuspid regurgitation signal is inadequate for assessing PA pressure. Left Atrium: Left atrial size was mildly dilated. Right Atrium: Right atrial size was normal in size. Pericardium: There is no evidence of pericardial effusion. Mitral Valve: The mitral valve is normal in structure. Mild mitral valve regurgitation. MV peak gradient, 4.1 mmHg. The mean mitral valve gradient is 2.0 mmHg. Tricuspid Valve: The tricuspid valve is normal in structure. Tricuspid valve regurgitation is not demonstrated. Aortic Valve: The aortic valve is tricuspid. Aortic valve regurgitation is not visualized. No aortic stenosis is present. Aortic valve mean gradient measures 3.0 mmHg. Aortic valve peak gradient measures 6.2 mmHg. Aortic valve area, by VTI measures 2.47 cm. Pulmonic Valve: The pulmonic valve was not well visualized. Pulmonic valve regurgitation is not visualized. Aorta: The aortic root and ascending aorta are structurally normal, with no evidence of dilitation. Venous: The inferior vena cava is normal in size with greater than 50% respiratory variability, suggesting right atrial pressure of 3 mmHg. IAS/Shunts: No atrial level shunt detected by color flow Doppler.  LEFT VENTRICLE PLAX 2D LVIDd:         3.90 cm   Diastology LVIDs:         2.90 cm   LV e' medial:    6.90 cm/s LV PW:         1.28 cm   LV E/e' medial:  15.7 LV IVS:        1.23 cm   LV e' lateral:   9.30 cm/s LVOT diam:     2.00 cm   LV E/e' lateral: 11.6 LV SV:         63 LV SV Index:   31 LVOT Area:     3.14 cm  RIGHT VENTRICLE             IVC RV Basal diam:  3.20 cm     IVC diam: 1.70 cm RV S prime:     13.20 cm/s TAPSE (M-mode): 2.6 cm LEFT ATRIUM              Index        RIGHT ATRIUM           Index LA diam:        3.90 cm 1.90 cm/m   RA Area:     13.20 cm LA Vol (A2C):   47.7 ml 23.21 ml/m  RA Volume:   30.60 ml  14.89 ml/m LA Vol (A4C):   32.5 ml 15.81 ml/m LA Biplane Vol: 40.1 ml 19.51 ml/m  AORTIC VALVE AV Area (Vmax):    2.27 cm AV Area (Vmean):   2.44 cm AV Area (VTI):     2.47 cm AV Vmax:           125.00 cm/s AV Vmean:          77.000 cm/s AV VTI:            0.256 m AV Peak Grad:      6.2 mmHg AV Mean Grad:      3.0 mmHg LVOT Vmax:         90.20 cm/s LVOT Vmean:        59.900 cm/s LVOT VTI:  0.201 m LVOT/AV VTI ratio: 0.79  AORTA Ao Root diam: 3.50 cm Ao Asc diam:  3.30 cm MITRAL VALVE MV Area (PHT): 3.39 cm     SHUNTS MV Area VTI:   2.15 cm     Systemic VTI:  0.20 m MV Peak grad:  4.1 mmHg     Systemic Diam: 2.00 cm MV Mean grad:  2.0 mmHg MV Vmax:       1.01 m/s MV Vmean:      62.0 cm/s MV Decel Time: 224 msec MV E velocity: 108.00 cm/s MV A velocity: 83.60 cm/s MV E/A ratio:  1.29 Mihai Croitoru MD Electronically signed by Sanda Klein MD Signature Date/Time: 12/28/2021/3:23:18 PM    Final    Disposition   Pt is being discharged home today in good condition.  Follow-up Plans & Appointments     Follow-up Information     Lelon Perla, MD Follow up.   Specialty: Cardiology Why: The office will call. Contact information: [address] [place] [address] [phone]                Discharge Instructions     Amb Referral to Cardiac Rehabilitation   Complete by: As directed    Diagnosis:  NSTEMI Coronary Stents PTCA     After initial evaluation and assessments completed: Virtual Based Care may be provided alone or in conjunction with Phase 2 Cardiac Rehab based on patient barriers.: Yes   Call MD for:  redness, tenderness, or signs of infection (pain, swelling, redness, odor or green/yellow discharge around incision site)   Complete by: As directed    Diet - low sodium heart healthy    Complete by: As directed    Increase activity slowly   Complete by: As directed        Discharge Medications   Allergies as of 12/29/2021       Reactions   Prednisone Other (See Comments)   Severe mood swings   Meloxicam Rash   Penicillins Rash        Medication List     STOP taking these medications    diclofenac 75 MG EC tablet Commonly known as: VOLTAREN       TAKE these medications    acetaminophen 325 MG tablet Commonly known as: TYLENOL Take 2 tablets (650 mg total) by mouth every 4 (four) hours as needed for headache or mild pain.   ALPRAZolam 1 MG tablet Commonly known as: XANAX TAKE 1/2 TO 1 TABLET BY MOUTH TWO TIMES A DAY AS NEEDED FOR ANXIETY. Use sparingly.   amphetamine-dextroamphetamine 10 MG 24 hr capsule Commonly known as: Adderall XR Take 1 capsule (10 mg total) by mouth 2 (two) times daily with breakfast and lunch. Start taking on: January 09, 2022   ARIPiprazole 10 MG tablet Commonly known as: ABILIFY TAKE ONE TABLET BY MOUTH DAILY   aspirin 81 MG EC tablet Take 1 tablet (81 mg total) by mouth daily. Swallow whole. Start taking on: December 30, 2021   buPROPion 300 MG 24 hr tablet Commonly known as: WELLBUTRIN XL Take 1 tablet (300 mg total) by mouth daily.   clopidogrel 75 MG tablet Commonly known as: Plavix Take 1 tablet (75 mg total) by mouth daily. Take 4 tabs this pm for a loading dose, then 1 tab daily.   esomeprazole 20 MG capsule Commonly known as: NEXIUM Take 20 mg by mouth daily at 12 noon.   FISH OIL PO Take by mouth.  ketoconazole 2 % cream Commonly known as: NIZORAL APPLY TO AFFECTED AREA(S) ONCE TO TWICE DAILY   lisinopril 2.5 MG tablet Commonly known as: ZESTRIL Take 1 tablet (2.5 mg total) by mouth daily. Start taking on: December 30, 2021   metFORMIN 500 MG 24 hr tablet Commonly known as: GLUCOPHAGE-XR Take 2 tablets (1,000 mg total) by mouth daily with breakfast.   metoprolol succinate 25 MG 24  hr tablet Commonly known as: TOPROL-XL Take 1 tablet (25 mg total) by mouth daily.   multivitamin tablet Take 1 tablet by mouth daily.   nitroGLYCERIN 0.4 MG SL tablet Commonly known as: NITROSTAT Place 1 tablet (0.4 mg total) under the tongue every 5 (five) minutes x 3 doses as needed for chest pain.   rosuvastatin 40 MG tablet Commonly known as: CRESTOR Take 1 tablet (40 mg total) by mouth daily.   triamcinolone ointment 0.5 % Commonly known as: KENALOG Apply to affected area 1-2 times daily   venlafaxine XR 150 MG 24 hr capsule Commonly known as: EFFEXOR-XR TAKE TWO CAPSULES BY MOUTH EVERY MORNING WITH BREAKFAST   VITAMIN B-12 PO Take by mouth.   Vitamin D3 10 MCG (400 UNIT) Caps Take by mouth.           Outstanding Labs/Studies   None  Duration of Discharge Encounter   Greater than 30 minutes including physician time.  Signed, Rosaria Ferries, PA-C 12/29/2021, 11:52 AM

## 2021-12-31 ENCOUNTER — Encounter (HOSPITAL_COMMUNITY): Payer: Self-pay | Admitting: Cardiology

## 2021-12-31 ENCOUNTER — Telehealth: Payer: Self-pay | Admitting: Cardiology

## 2021-12-31 NOTE — Telephone Encounter (Signed)
TOC scheduled for 01/08/22 at 10:55am with [name], NP per Rosaria Ferries, PA

## 2021-12-31 NOTE — Telephone Encounter (Signed)
Patient contacted regarding discharge from [facility] on 12/29/21.  Patient understands to follow up with provider Cle[place] on 01/08/22 at 10:55 am at [facility]. Patient understands discharge instructions? yes Patient understands medications and regiment? yes  Patient understands to bring all medications to this visit? yes

## 2022-01-02 ENCOUNTER — Encounter: Payer: Self-pay | Admitting: Physician Assistant

## 2022-01-03 NOTE — Telephone Encounter (Signed)
Please advise 

## 2022-01-06 ENCOUNTER — Other Ambulatory Visit: Payer: Self-pay | Admitting: Physician Assistant
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
## 2022-01-07 ENCOUNTER — Telehealth (HOSPITAL_COMMUNITY): Payer: Self-pay

## 2022-01-07 NOTE — Telephone Encounter (Signed)
Called patient to see if she is interested in the Cardiac Rehab Program. Patient expressed interest. Explained scheduling process and went over insurance, patient verbalized understanding. Will contact patient for scheduling once f/u has been completed. 

## 2022-01-07 NOTE — Telephone Encounter (Signed)
Pt insurance is active and benefits verified through Medicare A/B. Co-pay $0.00, DED $226.00/$226.00 met, out of pocket $0.00/$0.00 met, co-insurance 20%. No pre-authorization required. Passport, 01/07/22 @ 2:51PM, DTP#[id number]   Will contact patient to see if she is interested in the Cardiac Rehab Program. If interested, patient will need to complete follow up appt. Once completed, patient will be contacted for scheduling upon review by the RN Navigator.

## 2022-01-08 ENCOUNTER — Other Ambulatory Visit: Payer: Self-pay

## 2022-01-08 ENCOUNTER — Ambulatory Visit (INDEPENDENT_AMBULATORY_CARE_PROVIDER_SITE_OTHER): Payer: Medicare Other | Admitting: General Practice

## 2022-01-08 ENCOUNTER — Encounter: Payer: Self-pay | Admitting: General Practice

## 2022-01-08 VITALS — BP 112/80 | HR 77 | Ht 67.0 in | Wt 213.8 lb

## 2022-01-08 DIAGNOSIS — R7303 Prediabetes: Secondary | ICD-10-CM

## 2022-01-08 DIAGNOSIS — I10 Essential (primary) hypertension: Secondary | ICD-10-CM

## 2022-01-08 DIAGNOSIS — E782 Mixed hyperlipidemia: Secondary | ICD-10-CM

## 2022-01-08 DIAGNOSIS — I214 Non-ST elevation (NSTEMI) myocardial infarction: Secondary | ICD-10-CM | POA: Diagnosis not present

## 2022-01-08 NOTE — Progress Notes (Incomplete)
Crystal Bean is a 46 y.o. female here for a {New prob or follow up:31724}.  SCRIBE STATEMENT  History of Present Illness:   No chief complaint on file.   HPI On 12/27/21, Crystal Bean  presented to the ED with c/o chest pain that occurred once the night prior and twice the following morning. Pt describes pain as a dull sensation in the middle of her chest that radiated up to bilateral neck and down both arms. Pain was focused into her left shoulder but would transition down her right arm. Pt stated that during the episodes of pain she was usually not doing anything but starts to sweat slightly. Pt does have a hx of HTN, but has lost her designation of this and prediabetes due to immense weight loss. Denies SOB, abdominal/back pain, hx of HLD or fhx of CAD. Pt is currently still compliant with taking metformin 1000 mg for weight loss control and is a former smoker.   Due to nature of sx, pt underwent routine blood work, EKG, and Chest xray. Although chest xray and EKG were unremarkable, she did have a troponin leak that showed concern for a mild non-STEMI.    I discussed with cardiology, Dr. Johney Frame, who agrees on admission to the cardiology service and we will start aspirin and heparin.  She asked for starting 20 mg Crestor, will also send lipid panel.  Besides the mildly elevated troponin, her labs are pretty unremarkable besides a slight leukocytosis of unclear etiology.  I have viewed her chest x-ray images and there is no obvious pneumonia or CHF.  ECG shows some nonspecific V2 T wave inversion, but no old to compare to and no ST elevations.  I doubt PE or dissection.   Currently ***   Past Medical History:  Diagnosis Date   ADHD (attention deficit hyperactivity disorder)    Anxiety    B12 deficiency    Back pain    Bipolar 1 disorder (HCC)    Bulging lumbar disc    C. difficile diarrhea 04/24/2016   treated with metronidazole   Constipation    Depression    GERD (gastroesophageal reflux  disease)    History of chicken pox    History of shingles 2013   Hypertension    IBS (irritable bowel syndrome)    Joint pain    Major depressive disorder    Migraines    Obesity    Obsessive-compulsive disorder    Osteoarthritis    Prediabetes    PTSD (post-traumatic stress disorder)    Sleep apnea    SOB (shortness of breath)      Social History   Tobacco Use   Smoking status: Former    Packs/day: 1.00    Years: 20.00    Pack years: 20.00    Types: Cigarettes    Quit date: 06/19/2011    Years since quitting: 10.5   Smokeless tobacco: Never  Vaping Use   Vaping Use: Never used  Substance Use Topics   Alcohol use: Not Currently   Drug use: No    Past Surgical History:  Procedure Laterality Date   BLADDER SURGERY  1981   Urethera stretched   caps on teeth  1980   Caps on all Teeth   CESAREAN SECTION  2003   CORONARY STENT INTERVENTION N/A 12/28/2021   Procedure: CORONARY STENT INTERVENTION;  Surgeon: Martinique, Peter M, MD;  Location: [facility];  Service: Cardiovascular;  Laterality: N/A;   LEFT HEART CATH AND CORONARY  ANGIOGRAPHY N/A 12/28/2021   Procedure: LEFT HEART CATH AND CORONARY ANGIOGRAPHY;  Surgeon: Martinique, Peter M, MD;  Location: [facility];  Service: Cardiovascular;  Laterality: N/A;   TONSILLECTOMY     WISDOM TOOTH EXTRACTION      Family History  Problem Relation Age of Onset   Osteoporosis Mother    Depression Mother    Obesity Mother    Hearing loss Father    Obesity Father    Healthy Sister    CVA Maternal Grandmother    Heart disease Maternal Grandmother    Arthritis Paternal Grandmother    Hearing loss Paternal Grandmother    Dementia Paternal Grandmother    Dementia Paternal Grandfather    Eczema Daughter     Allergies  Allergen Reactions   Prednisone Other (See Comments)    Severe mood swings   Meloxicam Rash   Penicillins Rash    Current Medications:   Current Outpatient Medications:    acetaminophen (TYLENOL)  325 MG tablet, Take 2 tablets (650 mg total) by mouth every 4 (four) hours as needed for headache or mild pain., Disp: , Rfl:    ALPRAZolam (XANAX) 1 MG tablet, TAKE 1/2 TO 1 TABLET BY MOUTH TWO TIMES A DAY AS NEEDED FOR ANXIETY. Use sparingly., Disp: 60 tablet, Rfl: 0   [START ON 01/09/2022] amphetamine-dextroamphetamine (ADDERALL XR) 10 MG 24 hr capsule, Take 1 capsule (10 mg total) by mouth 2 (two) times daily with breakfast and lunch., Disp: 60 capsule, Rfl: 0   ARIPiprazole (ABILIFY) 10 MG tablet, TAKE ONE TABLET BY MOUTH DAILY, Disp: 90 tablet, Rfl: 3   aspirin EC 81 MG EC tablet, Take 1 tablet (81 mg total) by mouth daily. Swallow whole., Disp: 30 tablet, Rfl: 11   buPROPion (WELLBUTRIN XL) 300 MG 24 hr tablet, Take 1 tablet (300 mg total) by mouth daily., Disp: 90 tablet, Rfl: 3   Cholecalciferol (VITAMIN D3) 10 MCG (400 UNIT) CAPS, Take by mouth., Disp: , Rfl:    clopidogrel (PLAVIX) 75 MG tablet, Take 1 tablet (75 mg total) by mouth daily. Take 4 tabs this pm for a loading dose, then 1 tab daily., Disp: 34 tablet, Rfl: 11   Cyanocobalamin (VITAMIN B-12 PO), Take by mouth., Disp: , Rfl:    esomeprazole (NEXIUM) 20 MG capsule, Take 20 mg by mouth daily at 12 noon., Disp: , Rfl:    ketoconazole (NIZORAL) 2 % cream, APPLY TO AFFECTED AREA(S) ONCE TO TWICE DAILY, Disp: 30 g, Rfl: 0   lisinopril (ZESTRIL) 2.5 MG tablet, Take 1 tablet (2.5 mg total) by mouth daily., Disp: 30 tablet, Rfl: 6   metFORMIN (GLUCOPHAGE-XR) 500 MG 24 hr tablet, TAKE TWO TABLETS BY MOUTH DAILY WITH BREAKFAST, Disp: 60 tablet, Rfl: 2   metoprolol succinate (TOPROL-XL) 25 MG 24 hr tablet, Take 1 tablet (25 mg total) by mouth daily., Disp: 30 tablet, Rfl: 6   Multiple Vitamin (MULTIVITAMIN) tablet, Take 1 tablet by mouth daily., Disp: , Rfl:    nitroGLYCERIN (NITROSTAT) 0.4 MG SL tablet, Place 1 tablet (0.4 mg total) under the tongue every 5 (five) minutes x 3 doses as needed for chest pain., Disp: 25 tablet, Rfl: 12    Omega-3 Fatty Acids (FISH OIL PO), Take by mouth., Disp: , Rfl:    rosuvastatin (CRESTOR) 40 MG tablet, Take 1 tablet (40 mg total) by mouth daily., Disp: 30 tablet, Rfl: 6   triamcinolone ointment (KENALOG) 0.5 %, Apply to affected area 1-2 times daily, Disp: 15 g,  Rfl: 0   venlafaxine XR (EFFEXOR-XR) 150 MG 24 hr capsule, TAKE TWO CAPSULES BY MOUTH EVERY MORNING WITH BREAKFAST, Disp: 180 capsule, Rfl: 3   Review of Systems:   ROS Negative unless otherwise specified per HPI. Vitals:   There were no vitals filed for this visit.   There is no height or weight on file to calculate BMI.  Physical Exam:   Physical Exam  Assessment and Plan:        I,Havlyn C Ratchford,acting as a scribe for Inda Coke, PA.,have documented all relevant documentation on the behalf of Inda Coke, PA,as directed by  Inda Coke, PA while in the presence of Inda Coke, [place].  ***  Inda Coke, PA-C

## 2022-01-08 NOTE — Patient Instructions (Signed)
Medication Instructions:  The current medical regimen is effective;  continue present plan and medications as directed. Please refer to the Current Medication list given to you today.  *If you need a refill on your cardiac medications before your next appointment, please call your pharmacy*  Lab Work: FASTING LIPID AND LFT IN 4-6 WEEKS If you have labs (blood work) drawn today and your tests are completely normal, you will receive your results only by:  Crystal Bean (if you have MyChart) OR A paper copy in the mail.  If you have any lab test that is abnormal or we need to change your treatment, we will call you to review the results. You may go to any [facility] that is convenient for you however, we do have a lab in our office that is able to assist you. You DO NOT need an appointment for our lab. The lab is open 8:00am and closes at 4:00pm. Lunch 12:45 - 1:45pm.  Special Instructions OK FOR CARDIAC REHAB  PLEASE READ AND FOLLOW SALTY 6-ATTACHED-1,800 mg daily  PLEASE INCREASE PHYSICAL ACTIVITY-SLOWLY AS TOLERATED  Follow-Up: Your next appointment:  3-4 month(s) In Person with Kirk Ruths, MD    At [facility], you and your health needs are our priority.  As part of our continuing mission to provide you with exceptional heart care, we have created designated Provider Care Teams.  These Care Teams include your primary Cardiologist (physician) and Advanced Practice Providers (APPs -  Physician Assistants and Nurse Practitioners) who all work together to provide you with the care you need, when you need it.

## 2022-01-09 ENCOUNTER — Ambulatory Visit (INDEPENDENT_AMBULATORY_CARE_PROVIDER_SITE_OTHER): Payer: Medicare Other | Admitting: Physician Assistant

## 2022-01-09 ENCOUNTER — Encounter: Payer: Self-pay | Admitting: Physician Assistant

## 2022-01-09 VITALS — BP 102/68 | HR 73 | Temp 98.4°F | Ht 67.0 in | Wt 212.2 lb

## 2022-01-09 DIAGNOSIS — I214 Non-ST elevation (NSTEMI) myocardial infarction: Secondary | ICD-10-CM | POA: Diagnosis not present

## 2022-01-09 DIAGNOSIS — R946 Abnormal results of thyroid function studies: Secondary | ICD-10-CM

## 2022-01-09 DIAGNOSIS — Z79899 Other long term (current) drug therapy: Secondary | ICD-10-CM | POA: Diagnosis not present

## 2022-01-09 DIAGNOSIS — I10 Essential (primary) hypertension: Secondary | ICD-10-CM

## 2022-01-09 NOTE — Patient Instructions (Signed)
It was great to see you!  Ask psychiatrist about  ----Abilify -- causes increased cholesterol -- is there better option? ---Adderall -- can be too stimulating -- is there better option?  Ask Juleen China about ---Continued metformin use? Just to reduce medications  We will update your thyroid levels as they were slightly abnormal in   As long as you are not tender in your chest wall, you should be able to have your mammogram  Let's follow-up in 3-6 months, sooner if you have concerns.  Take care,  Inda Coke PA-C

## 2022-01-09 NOTE — Progress Notes (Signed)
Crystal Bean is a 46 y.o. female here for a hospital follow up.  History of Present Illness:   Chief Complaint  Patient presents with   Hospitalization Follow-up    Pt was hospitalized on 2/9 for Heart attack. She states feels okay other than being tired more than usual.    HPI  NSTEMI On 12/27/21, Crystal Bean  presented to the ED with c/o chest pain that occurred once the night prior and twice the following morning. Due to having the elevated BP reading, she decided to go to the ER. Due to nature of sx, pt underwent routine blood work, EKG, and Chest xray. Besides the mildly elevated troponin, her labs are pretty unremarkable. Chest xray showed no obvious pneumonia or CHF and ECG showed nonspecific V2 T wave inversion, but no old to compare to and no ST elevations. Following results, Dr. Johney Frame, cardiology was consulted and agreed with pt's admission to the cardiology service.    Upon admission pt was initially treated with ASA 324 mg, heparin, and started on a beta-blocker as well as lisinopril. Pt's statin was increased to high dose statin due to lipid profile being elevated. On 12/28/21, she was taken to Cath lab where she was found to have a single-vessel disease with prox LAD lesion that was 80% stenosed. As a result she had a drug-eluting stent placed which she tolerated well.   Her EF was low normal by echo with a wall motion abnormality and grade 2 diastolic dysfunction. BNP was normal and there was no CHF. On the following day, she was seen by cardiac rehab and ambulated with no CP or SOB.     Currently Crystal Bean is compliant with taking crestor 40 mg daily, plavix 75 mg daily, lisinopril 2.5 mg daily, and aspirin 81 mg daily with no complications. She has since followed up with [name], cardiology and was found to be recovering well.   Crystal Bean is set to participate in cardiac rehab but due to their wait list being 3 months long, she has been cleared to gradually work in at least 10  minutes of walking a day.      Abnormal TSH Levels After routine blood work, pt was found to have an elevated TSH of 5.967, but her T4, free was normal. It was recommended that this be updated by her PCP. She is in agreement to re-check this today.    Medication Review ADHD Since ED visit and hospital admission, she has not been taking her adderall 10 mg twice daily due to complications with other medications. As a result she has been feeling more tired than usual. She has recently been cleared to restart this medication, but she is worried that this could cause problems down the road.   Bipolar Disorder  As of late she has been experiencing high and low moods including loss of motivation and cry spells, but believes this has been caused by being off of her ADHD medication. She is currently compliant with taking Abilify 10 mg daily with no complications.   Past Medical History:  Diagnosis Date   ADHD (attention deficit hyperactivity disorder)    Anxiety    B12 deficiency    Back pain    Bipolar 1 disorder (HCC)    Bulging lumbar disc    C. difficile diarrhea 04/24/2016   treated with metronidazole   Constipation    Depression    GERD (gastroesophageal reflux disease)    History of chicken pox    History of  shingles 2013   Hypertension    IBS (irritable bowel syndrome)    Joint pain    Major depressive disorder    Migraines    Obesity    Obsessive-compulsive disorder    Osteoarthritis    Prediabetes    PTSD (post-traumatic stress disorder)    Sleep apnea    SOB (shortness of breath)      Social History   Tobacco Use   Smoking status: Former    Packs/day: 1.00    Years: 20.00    Pack years: 20.00    Types: Cigarettes    Quit date: 06/19/2011    Years since quitting: 10.5   Smokeless tobacco: Never  Vaping Use   Vaping Use: Never used  Substance Use Topics   Alcohol use: Not Currently   Drug use: No    Past Surgical History:  Procedure Laterality Date    BLADDER SURGERY  1981   Urethera stretched   caps on teeth  1980   Caps on all Teeth   CESAREAN SECTION  2003   CORONARY STENT INTERVENTION N/A 12/28/2021   Procedure: CORONARY STENT INTERVENTION;  Surgeon: Martinique, Peter M, MD;  Location: [facility];  Service: Cardiovascular;  Laterality: N/A;   LEFT HEART CATH AND CORONARY ANGIOGRAPHY N/A 12/28/2021   Procedure: LEFT HEART CATH AND CORONARY ANGIOGRAPHY;  Surgeon: Martinique, Peter M, MD;  Location: [facility];  Service: Cardiovascular;  Laterality: N/A;   TONSILLECTOMY     WISDOM TOOTH EXTRACTION      Family History  Problem Relation Age of Onset   Osteoporosis Mother    Depression Mother    Obesity Mother    Hearing loss Father    Obesity Father    Healthy Sister    CVA Maternal Grandmother    Heart disease Maternal Grandmother    Arthritis Paternal Grandmother    Hearing loss Paternal Grandmother    Dementia Paternal Grandmother    Dementia Paternal Grandfather    Eczema Daughter     Allergies  Allergen Reactions   Prednisone Other (See Comments)    Severe mood swings   Meloxicam Rash   Penicillins Rash    Current Medications:   Current Outpatient Medications:    acetaminophen (TYLENOL) 325 MG tablet, Take 2 tablets (650 mg total) by mouth every 4 (four) hours as needed for headache or mild pain., Disp: , Rfl:    ALPRAZolam (XANAX) 1 MG tablet, TAKE 1/2 TO 1 TABLET BY MOUTH TWO TIMES A DAY AS NEEDED FOR ANXIETY. Use sparingly., Disp: 60 tablet, Rfl: 0   ARIPiprazole (ABILIFY) 10 MG tablet, TAKE ONE TABLET BY MOUTH DAILY, Disp: 90 tablet, Rfl: 3   aspirin EC 81 MG EC tablet, Take 1 tablet (81 mg total) by mouth daily. Swallow whole., Disp: 30 tablet, Rfl: 11   buPROPion (WELLBUTRIN XL) 300 MG 24 hr tablet, Take 1 tablet (300 mg total) by mouth daily., Disp: 90 tablet, Rfl: 3   Cholecalciferol (VITAMIN D3) 10 MCG (400 UNIT) CAPS, Take by mouth., Disp: , Rfl:    clopidogrel (PLAVIX) 75 MG tablet, Take 1 tablet  (75 mg total) by mouth daily. Take 4 tabs this pm for a loading dose, then 1 tab daily., Disp: 34 tablet, Rfl: 11   Cyanocobalamin (VITAMIN B-12 PO), Take by mouth., Disp: , Rfl:    esomeprazole (NEXIUM) 20 MG capsule, Take 20 mg by mouth daily at 12 noon., Disp: , Rfl:    ketoconazole (NIZORAL) 2 %  cream, APPLY TO AFFECTED AREA(S) ONCE TO TWICE DAILY, Disp: 30 g, Rfl: 0   lisinopril (ZESTRIL) 2.5 MG tablet, Take 1 tablet (2.5 mg total) by mouth daily., Disp: 30 tablet, Rfl: 6   metFORMIN (GLUCOPHAGE-XR) 500 MG 24 hr tablet, TAKE TWO TABLETS BY MOUTH DAILY WITH BREAKFAST, Disp: 60 tablet, Rfl: 2   metoprolol succinate (TOPROL-XL) 25 MG 24 hr tablet, Take 1 tablet (25 mg total) by mouth daily., Disp: 30 tablet, Rfl: 6   Multiple Vitamin (MULTIVITAMIN) tablet, Take 1 tablet by mouth daily., Disp: , Rfl:    nitroGLYCERIN (NITROSTAT) 0.4 MG SL tablet, Place 1 tablet (0.4 mg total) under the tongue every 5 (five) minutes x 3 doses as needed for chest pain., Disp: 25 tablet, Rfl: 12   Omega-3 Fatty Acids (FISH OIL PO), Take by mouth., Disp: , Rfl:    rosuvastatin (CRESTOR) 40 MG tablet, Take 1 tablet (40 mg total) by mouth daily., Disp: 30 tablet, Rfl: 6   triamcinolone ointment (KENALOG) 0.5 %, Apply to affected area 1-2 times daily, Disp: 15 g, Rfl: 0   venlafaxine XR (EFFEXOR-XR) 150 MG 24 hr capsule, TAKE TWO CAPSULES BY MOUTH EVERY MORNING WITH BREAKFAST, Disp: 180 capsule, Rfl: 3   amphetamine-dextroamphetamine (ADDERALL XR) 10 MG 24 hr capsule, Take 1 capsule (10 mg total) by mouth 2 (two) times daily with breakfast and lunch. (Patient not taking: Reported on 01/09/2022), Disp: 60 capsule, Rfl: 0   Review of Systems:   ROS Negative unless otherwise specified per HPI. Vitals:   Vitals:   01/09/22 1301  BP: 102/68  Pulse: 73  Temp: 98.4 F (36.9 C)  TempSrc: Temporal  SpO2: 96%  Weight: 212 lb 4 oz (96.3 kg)  Height: 5\' 7"  (1.702 m)     Body mass index is 33.24 kg/m.  Physical Exam:    Physical Exam Vitals and nursing note reviewed.  Constitutional:      General: She is not in acute distress.    Appearance: She is well-developed. She is not ill-appearing or toxic-appearing.  Cardiovascular:     Rate and Rhythm: Normal rate and regular rhythm.     Pulses: Normal pulses.     Heart sounds: Normal heart sounds, S1 normal and S2 normal.  Pulmonary:     Effort: Pulmonary effort is normal.     Breath sounds: Normal breath sounds.  Skin:    General: Skin is warm and dry.  Neurological:     Mental Status: She is alert.     GCS: GCS eye subscore is 4. GCS verbal subscore is 5. GCS motor subscore is 6.  Psychiatric:        Speech: Speech normal.        Behavior: Behavior normal. Behavior is cooperative.    Assessment and Plan:   NSTEMI (non-ST elevated myocardial infarction) ([place]) Stable; managing well Cardiology notes reviewed Continue plavix 75 mg daily, Crestor 40 mg daily, and aspirin 81 mg daily Encouraged patient to gradually increase daily exercise and continue healthy eating habits  Follow up in 3-6 months, sooner if concerns occur   Abnormal TSH Update labs, will make recommendations accordingly   Encounter for medication review Recommended patient ask psychiatrist about alternative medications for her Bipolar Disorder and ADHD that are safe for her heart Informed patient I am happy to help as needed   Essential (primary) hypertension Controlled Continue Toprol-XL 25 mg daily and Lisinopril 2.5 mg daily  Follow up in 3 months, sooner if concerns  [facility]  C Ratchford,acting as a scribe for [employer], PA.,have documented all relevant documentation on the behalf of Inda Coke, PA,as directed by  Inda Coke, PA while in the presence of Inda Coke, [place].  I, Inda Coke, [place], have reviewed all documentation for this visit. The documentation on 01/09/22 for the exam, diagnosis, procedures, and orders are all accurate and  complete.   Inda Coke, PA-C

## 2022-01-11 LAB — T4, FREE: Free T4: 0.66 ng/dL (ref 0.60–1.60)

## 2022-01-11 LAB — TSH: TSH: 2.24 u[IU]/mL (ref 0.35–5.50)

## 2022-01-16 ENCOUNTER — Ambulatory Visit: Payer: Medicare Other | Admitting: Physician Assistant

## 2022-01-24 ENCOUNTER — Ambulatory Visit (INDEPENDENT_AMBULATORY_CARE_PROVIDER_SITE_OTHER): Payer: Medicare Other | Admitting: Family Medicine

## 2022-01-24 ENCOUNTER — Encounter (INDEPENDENT_AMBULATORY_CARE_PROVIDER_SITE_OTHER): Payer: Self-pay

## 2022-01-28 ENCOUNTER — Ambulatory Visit (INDEPENDENT_AMBULATORY_CARE_PROVIDER_SITE_OTHER): Payer: Medicare Other | Admitting: Physician Assistant

## 2022-01-28 ENCOUNTER — Encounter: Payer: Self-pay | Admitting: Physician Assistant

## 2022-01-28 VITALS — BP 100/65 | HR 68 | Temp 97.4°F | Ht 67.0 in | Wt 217.6 lb

## 2022-01-28 DIAGNOSIS — M545 Low back pain, unspecified: Secondary | ICD-10-CM

## 2022-01-28 DIAGNOSIS — G8929 Other chronic pain: Secondary | ICD-10-CM | POA: Diagnosis not present

## 2022-01-28 DIAGNOSIS — G4452 New daily persistent headache (NDPH): Secondary | ICD-10-CM | POA: Diagnosis not present

## 2022-01-28 LAB — COMPREHENSIVE METABOLIC PANEL
ALT: 16 U/L (ref 0–35)
AST: 17 U/L (ref 0–37)
Albumin: 4.3 g/dL (ref 3.5–5.2)
Alkaline Phosphatase: 67 U/L (ref 39–117)
BUN: 16 mg/dL (ref 6–23)
CO2: 27 mEq/L (ref 19–32)
Calcium: 9.6 mg/dL (ref 8.4–10.5)
Chloride: 99 mEq/L (ref 96–112)
Creatinine, Ser: 0.98 mg/dL (ref 0.40–1.20)
GFR: 69.33 mL/min (ref >60.00)
Glucose, Bld: 77 mg/dL (ref 70–99)
Potassium: 4.7 mEq/L (ref 3.5–5.1)
Sodium: 134 mEq/L — ABNORMAL LOW (ref 135–145)
Total Bilirubin: 0.3 mg/dL (ref 0.2–1.2)
Total Protein: 7.4 g/dL (ref 6.0–8.3)

## 2022-01-28 LAB — CBC WITH DIFFERENTIAL/PLATELET
Basophils Absolute: 0.1 10*3/uL (ref 0.0–0.1)
Basophils Relative: 0.6 % (ref 0.0–3.0)
Eosinophils Absolute: 0.1 10*3/uL (ref 0.0–0.7)
Eosinophils Relative: 0.7 % (ref 0.0–5.0)
HCT: 38.3 % (ref 36.0–46.0)
Hemoglobin: 12.8 g/dL (ref 12.0–15.0)
Lymphocytes Relative: 19.5 % (ref 12.0–46.0)
Lymphs Abs: 2.1 10*3/uL (ref 0.7–4.0)
MCHC: 33.3 g/dL (ref 30.0–36.0)
MCV: 86.7 fl (ref 78.0–100.0)
Monocytes Absolute: 0.7 10*3/uL (ref 0.1–1.0)
Monocytes Relative: 6.4 % (ref 3.0–12.0)
Neutro Abs: 8 10*3/uL — ABNORMAL HIGH (ref 1.4–7.7)
Neutrophils Relative %: 72.8 % (ref 43.0–77.0)
Platelets: 383 10*3/uL (ref 150.0–400.0)
RBC: 4.41 Mil/uL (ref 3.87–5.11)
RDW: 15.7 % — ABNORMAL HIGH (ref 11.5–15.5)
WBC: 11 10*3/uL — ABNORMAL HIGH (ref 4.0–10.5)

## 2022-01-28 MED ORDER — CYCLOBENZAPRINE HCL 10 MG PO TABS: ORAL_TABLET | ORAL | 0 refills | Status: DC

## 2022-01-28 NOTE — Progress Notes (Signed)
Crystal Bean is a 46 y.o. female here for recurrent headaches.  History of Present Illness:   Chief Complaint  Patient presents with   Headache    Pt has recently been having headaches everyday for the past week; wondering if they are medication related since recently changed meds;  Pt is also having more pain in back legs and knees; advised pt of colonoscopy screening     HPI  Recurrent Headaches [name] presents with c/o recurrent headaches that have been occurring intermittently everyday for the past week. States that her headaches are focused on the right side of her head and can be rated a 5/10 in terms of pain. She expresses that her husband is concerned that this could be caused by recently starting medications  Crestor 20 mg, Toprol- XL 25 mg, and Lisinopril 2.5 mg daily. Upon further discussion she does admit that she hasn't been wearing her glasses lately due to feeling she doesn't need them while at home. She also reports that her BP has remained controlled during these episodes, usually averaging 100/66.   Pt has been experiencing light sensitivity, but states this has been ongoing for years. She does have a hx of migraines, but expresses this does not feel similar to past migraines. In an effort to manage pain, she has been taking at most 4 tylenol 500 mg daily which has provided minor relief.   Denies weakness on one side of body, abnormal vision changes, nausea, vomiting, sleep/ dietary changes, or abnormal BP levels.    Bilateral Lower Extremity Pain In addition to other issue, she has also been experiencing pain in bilateral knees and back of legs. States she has noticed this to become a problem for her since having to stop taking diclofenac 75 mg daily. At this time she is looking for other options that could provide her with relief. Denies numbness/tingling, tenderness, or redness/warmth.   Past Medical History:  Diagnosis Date   ADHD (attention deficit hyperactivity  disorder)    Anxiety    B12 deficiency    Back pain    Bipolar 1 disorder (HCC)    Bulging lumbar disc    C. difficile diarrhea 04/24/2016   treated with metronidazole   Constipation    Depression    GERD (gastroesophageal reflux disease)    History of chicken pox    History of shingles 2013   Hypertension    IBS (irritable bowel syndrome)    Joint pain    Major depressive disorder    Migraines    Obesity    Obsessive-compulsive disorder    Osteoarthritis    Prediabetes    PTSD (post-traumatic stress disorder)    Sleep apnea    SOB (shortness of breath)      Social History   Tobacco Use   Smoking status: Former    Packs/day: 1.00    Years: 20.00    Pack years: 20.00    Types: Cigarettes    Quit date: 06/19/2011    Years since quitting: 10.6   Smokeless tobacco: Never  Vaping Use   Vaping Use: Never used  Substance Use Topics   Alcohol use: Not Currently   Drug use: No    Past Surgical History:  Procedure Laterality Date   BLADDER SURGERY  1981   Urethera stretched   caps on teeth  1980   Caps on all Teeth   CESAREAN SECTION  2003   CORONARY STENT INTERVENTION N/A 12/28/2021   Procedure: CORONARY STENT INTERVENTION;  Surgeon: Martinique, Peter M, MD;  Location: [facility];  Service: Cardiovascular;  Laterality: N/A;   LEFT HEART CATH AND CORONARY ANGIOGRAPHY N/A 12/28/2021   Procedure: LEFT HEART CATH AND CORONARY ANGIOGRAPHY;  Surgeon: Martinique, Peter M, MD;  Location: [facility];  Service: Cardiovascular;  Laterality: N/A;   TONSILLECTOMY     WISDOM TOOTH EXTRACTION      Family History  Problem Relation Age of Onset   Osteoporosis Mother    Depression Mother    Obesity Mother    Hearing loss Father    Obesity Father    Healthy Sister    CVA Maternal Grandmother    Heart disease Maternal Grandmother    Arthritis Paternal Grandmother    Hearing loss Paternal Grandmother    Dementia Paternal  Grandmother    Dementia Paternal Grandfather    Eczema Daughter     Allergies  Allergen Reactions   Prednisone Other (See Comments)    Severe mood swings   Meloxicam Rash   Penicillins Rash    Current Medications:   Current Outpatient Medications:    acetaminophen (TYLENOL) 325 MG tablet, Take 2 tablets (650 mg total) by mouth every 4 (four) hours as needed for headache or mild pain., Disp: , Rfl:    ALPRAZolam (XANAX) 1 MG tablet, TAKE 1/2 TO 1 TABLET BY MOUTH TWO TIMES A DAY AS NEEDED FOR ANXIETY. Use sparingly., Disp: 60 tablet, Rfl: 0   amphetamine-dextroamphetamine (ADDERALL XR) 10 MG 24 hr capsule, Take 1 capsule (10 mg total) by mouth 2 (two) times daily with breakfast and lunch., Disp: 60 capsule, Rfl: 0   ARIPiprazole (ABILIFY) 10 MG tablet, TAKE ONE TABLET BY MOUTH DAILY, Disp: 90 tablet, Rfl: 3   aspirin EC 81 MG EC tablet, Take 1 tablet (81 mg total) by mouth daily. Swallow whole., Disp: 30 tablet, Rfl: 11   buPROPion (WELLBUTRIN XL) 300 MG 24 hr tablet, Take 1 tablet (300 mg total) by mouth daily., Disp: 90 tablet, Rfl: 3   Cholecalciferol (VITAMIN D3) 10 MCG (400 UNIT) CAPS, Take by mouth., Disp: , Rfl:    clopidogrel (PLAVIX) 75 MG tablet, Take 1 tablet (75 mg total) by mouth daily. Take 4 tabs this pm for a loading dose, then 1 tab daily., Disp: 34 tablet, Rfl: 11   Cyanocobalamin (VITAMIN B-12 PO), Take by mouth., Disp: , Rfl:    cyclobenzaprine (FLEXERIL) 10 MG tablet, Take 0.5 - 1 tablet up to three times daily as needed for muscle spasm, Disp: 30 tablet, Rfl: 0   esomeprazole (NEXIUM) 20 MG capsule, Take 20 mg by mouth daily at 12 noon., Disp: , Rfl:    ketoconazole (NIZORAL) 2 % cream, APPLY TO AFFECTED AREA(S) ONCE TO TWICE DAILY, Disp: 30 g, Rfl: 0   lisinopril (ZESTRIL) 2.5 MG tablet, Take 1 tablet (2.5 mg total) by mouth daily., Disp: 30 tablet, Rfl: 6   metFORMIN (GLUCOPHAGE-XR) 500 MG 24 hr tablet, TAKE TWO TABLETS BY MOUTH DAILY WITH  BREAKFAST, Disp: 60 tablet, Rfl: 2   metoprolol succinate (TOPROL-XL) 25 MG 24 hr tablet, Take 1 tablet (25 mg total) by mouth daily., Disp: 30 tablet, Rfl: 6   Multiple Vitamin (MULTIVITAMIN) tablet, Take 1 tablet by mouth daily., Disp: , Rfl:    nitroGLYCERIN (NITROSTAT) 0.4 MG SL tablet, Place 1 tablet (0.4 mg total) under the tongue every 5 (five) minutes x 3 doses as needed for chest pain., Disp: 25 tablet, Rfl: 12   Omega-3 Fatty Acids (  FISH OIL PO), Take by mouth., Disp: , Rfl:    rosuvastatin (CRESTOR) 40 MG tablet, Take 1 tablet (40 mg total) by mouth daily., Disp: 30 tablet, Rfl: 6   triamcinolone ointment (KENALOG) 0.5 %, Apply to affected area 1-2 times daily, Disp: 15 g, Rfl: 0   venlafaxine XR (EFFEXOR-XR) 150 MG 24 hr capsule, TAKE TWO CAPSULES BY MOUTH EVERY MORNING WITH BREAKFAST, Disp: 180 capsule, Rfl: 3   Review of Systems:   ROS Negative unless otherwise specified per HPI. Vitals:   Vitals:   01/28/22 0924  BP: 100/65  Pulse: 68  Temp: (!) 97.4 F (36.3 C)  TempSrc: Temporal  SpO2: 98%  Weight: 217 lb 9.6 oz (98.7 kg)  Height: '5\' 7"'$  (1.702 m)     Body mass index is 34.08 kg/m.  Orthostatic VS for the past 24 hrs (Last 3 readings):  BP- Lying BP- Sitting BP- Standing at 0 minutes  01/28/22 0952 98/70 100/68 98/68    Physical Exam:   Physical Exam Vitals and nursing note reviewed.  Constitutional:      General: She is not in acute distress.    Appearance: She is well-developed. She is not ill-appearing or toxic-appearing.  Cardiovascular:     Rate and Rhythm: Normal rate and regular rhythm.     Pulses: Normal pulses.     Heart sounds: Normal heart sounds, S1 normal and S2 normal.  Pulmonary:     Effort: Pulmonary effort is normal.     Breath sounds: Normal breath sounds.  Skin:    General: Skin is warm and dry.  Neurological:     General: No focal deficit present.     Mental Status: She is alert.     GCS: GCS eye subscore is 4. GCS verbal  subscore is 5. GCS motor subscore is 6.     Cranial Nerves: Cranial nerves 2-12 are intact.     Sensory: Sensation is intact.     Motor: Motor function is intact.     Coordination: Coordination is intact.  Psychiatric:        Speech: Speech normal.        Behavior: Behavior normal. Behavior is cooperative.    Assessment and Plan:   New daily persistent headache No red flags - neuro exam is normal Suspect possibly multifactorial -- possible eye strain, rebound headache from overuse of tylenol, dehydration, new rx Update labs, will make recommendations accordingly  Will place referral to Dr. Shanon Rosser, Ophthalmology, for further evaluation  Discussed working on tylenol reduction with patient (could be contributing to rebound headaches) Encouraged patient to drink at least 64 oz of water daily Advised patient on worsening headache precautions  Follow up if new/worsening symptoms or concerns occur  Chronic low back pain No red flags Trial flexeril 10 mg as needed for pain/inflammation and topical voltaren gel as needed Informed patient to consider cutting Flexeril in half if it causes excessive drowsiness Follow up as needed  I,Havlyn C Ratchford,acting as a scribe for [employer], PA.,have documented all relevant documentation on the behalf of Inda Coke, PA,as directed by  Inda Coke, PA while in the presence of Inda Coke, [place].  I, Inda Coke, [place], have reviewed all documentation for this visit. The documentation on 01/28/22 for the exam, diagnosis, procedures, and orders are all accurate and complete.   Inda Coke, PA-C

## 2022-01-28 NOTE — Patient Instructions (Signed)
It was great to see you! ? ?PLAN FOR TODAY ?Referral to eye doctor --> I'm putting in a referral for Dr. Katy Fitch, they will call you to schedule. If you have not heard anything in a few days, please call [facility] at [phone] ?Update blood work ?Let's work on reduction of tylenol since its not doing much for you (could be contributing to rebound headaches) ?Trial as needed flexeril as muscle relaxer for pain/inflammation --> may make your drowsy! Consider cutting in half. ?Trial topical diclofenac (voltaren) for your pain ?Please try to drink 64 oz of WATER daily ? ?If headaches persist after this, please let me know. ? ?Headache Precautions: ?Contact a doctor if: ?Your symptoms are not helped by medicine. ?You have a headache that feels different than the other headaches. ?You feel sick to your stomach (nauseous) or you throw up (vomit). ?You have a fever. ?Get help right away if: ?Your headache gets very bad quickly. ?Your headache gets worse after a lot of physical activity. ?You keep throwing up. ?You have a stiff neck. ?You have trouble seeing. ?You have trouble speaking. ?You have pain in the eye or ear. ?Your muscles are weak or you lose muscle control. ?You lose your balance or have trouble walking. ?You feel like you will pass out (faint) or you pass out. ?You are mixed up (confused). ?You have a seizure.  ? ?Take care, ? ?Inda Coke PA-C  ?

## 2022-02-04 ENCOUNTER — Telehealth (HOSPITAL_COMMUNITY): Payer: Self-pay

## 2022-02-04 NOTE — Telephone Encounter (Signed)
Called and spoke with pt in regards to CR. Pt stated she is working out at a gym. ?  ?Closed referral ?

## 2022-02-06 ENCOUNTER — Encounter: Payer: Self-pay | Admitting: Physician Assistant

## 2022-02-08 ENCOUNTER — Ambulatory Visit (INDEPENDENT_AMBULATORY_CARE_PROVIDER_SITE_OTHER): Payer: Medicare Other | Admitting: Physician Assistant

## 2022-02-08 ENCOUNTER — Encounter: Payer: Self-pay | Admitting: Physician Assistant

## 2022-02-08 VITALS — BP 104/68 | HR 69 | Temp 98.1°F | Wt 215.2 lb

## 2022-02-08 DIAGNOSIS — G8929 Other chronic pain: Secondary | ICD-10-CM | POA: Diagnosis not present

## 2022-02-08 DIAGNOSIS — M545 Low back pain, unspecified: Secondary | ICD-10-CM

## 2022-02-08 DIAGNOSIS — R202 Paresthesia of skin: Secondary | ICD-10-CM | POA: Diagnosis not present

## 2022-02-08 DIAGNOSIS — R200 Anesthesia of skin: Secondary | ICD-10-CM | POA: Diagnosis not present

## 2022-02-08 NOTE — Patient Instructions (Signed)
It was great to see you! ? ?Stretches ?Cervical xray of neck ? ?Restart gabapentin 100 mg, may increase up to 300 mg ? ?An order for an xray has been put in for you. ?To get your xray, you can walk in at the [facility] location without a scheduled appointment.  ?The address is [address]. It is across the street from [facility]. ?X-ray is located in the basement.  ?Hours of operation are M-F 8:30am to 5:00pm. Please note that they are closed for lunch between 12:30 and 1:00pm. ?*For today only, they are closed from 11a-1p. ? ?A referral has been placed for you to see Dr. [name] with [facility]. ?Someone from there office will be in touch soon regarding your appointment with him. ?His location: [place] at [facility] [address] on the 1st floor.   ?Phone number [phone], Fax [phone].  ?This location is across the street from the entrance to [employer] and in the same complex as the [facility] and [employer] ? ?Take care, ? ?Inda Coke PA-C  ?

## 2022-02-08 NOTE — Progress Notes (Signed)
Crystal Bean is a 46 y.o. female here for right arm numbness. ? ?History of Present Illness:  ? ?Chief Complaint  ?Patient presents with  ? Numbness  ?  Started experiencing numbness about a month ago that would just randomly happen but now is becoming more constant.  ?Whole right arm is going numb  ? ? ?HPI ? ?Right Arm Numbness ?Crystal Bean presents with concerns due to sporadic right arm numbness that has been onset for a month but has become more frequent. Pt states this initially started occurring sporadically during the day with a numb/tingling sensation prior to transitioning into occurring at night when she sleeps on that side. Reports that although she is able to sleep on that side, it isn't long until she is woken up due to this becoming very painful. Pt admits that if this happens after 3:30 am then she is no longer able to fall back asleep. Due to this she decided to have this further evaluated and wanted to know what she could do to improve/prevent this. She does admit that she had neck pain during her recent hospital stay, but shares this has since improved.  ? ?Denies unusual headaches, vision changes, or recent trauma/injury.  ? ?She does note that this has been going on since being d/c from hospital. Did have cardiac cath procedure during that visit. ? ?Chronic back pain ?Was managing overall well with diclofenac 75 mg BID prior to MI. This was stopped after MI due to plavix. Recommended on 01/28/22 -- instructed to trial flexeril 10 mg as needed for pain/inflammation and topical voltaren gel as needed. She did not feel like that was very effective. Denies any concerning bowel/bladder incontinence or significant worsening of sx. ? ?Past Medical History:  ?Diagnosis Date  ? ADHD (attention deficit hyperactivity disorder)   ? Anxiety   ? B12 deficiency   ? Back pain   ? Bipolar 1 disorder ([place])   ? Bulging lumbar disc   ? C. difficile diarrhea 04/24/2016  ? treated with metronidazole  ? Constipation   ?  Depression   ? GERD (gastroesophageal reflux disease)   ? History of chicken pox   ? History of shingles 2013  ? Hypertension   ? IBS (irritable bowel syndrome)   ? Joint pain   ? Major depressive disorder   ? Migraines   ? Obesity   ? Obsessive-compulsive disorder   ? Osteoarthritis   ? Prediabetes   ? PTSD (post-traumatic stress disorder)   ? Sleep apnea   ? SOB (shortness of breath)   ? ?  ?Social History  ? ?Tobacco Use  ? Smoking status: Former  ?  Packs/day: 1.00  ?  Years: 20.00  ?  Pack years: 20.00  ?  Types: Cigarettes  ?  Quit date: 06/19/2011  ?  Years since quitting: 10.6  ? Smokeless tobacco: Never  ?Vaping Use  ? Vaping Use: Never used  ?Substance Use Topics  ? Alcohol use: Not Currently  ? Drug use: No  ? ? ?Past Surgical History:  ?Procedure Laterality Date  ? BLADDER SURGERY  1981  ? Urethera stretched  ? caps on teeth  1980  ? Caps on all Teeth  ? CESAREAN SECTION  2003  ? CORONARY STENT INTERVENTION N/A 12/28/2021  ? Procedure: CORONARY STENT INTERVENTION;  Surgeon: Martinique, Peter M, MD;  Location: [facility];  Service: Cardiovascular;  Laterality: N/A;  ? LEFT HEART CATH AND CORONARY ANGIOGRAPHY N/A 12/28/2021  ? Procedure:  LEFT HEART CATH AND CORONARY ANGIOGRAPHY;  Surgeon: Martinique, Peter M, MD;  Location: [facility];  Service: Cardiovascular;  Laterality: N/A;  ? TONSILLECTOMY    ? WISDOM TOOTH EXTRACTION    ? ? ?Family History  ?Problem Relation Age of Onset  ? Osteoporosis Mother   ? Depression Mother   ? Obesity Mother   ? Hearing loss Father   ? Obesity Father   ? Healthy Sister   ? CVA Maternal Grandmother   ? Heart disease Maternal Grandmother   ? Arthritis Paternal Grandmother   ? Hearing loss Paternal Grandmother   ? Dementia Paternal Grandmother   ? Dementia Paternal Grandfather   ? Eczema Daughter   ? ? ?Allergies  ?Allergen Reactions  ? Prednisone Other (See Comments)  ?  Severe mood swings  ? Meloxicam Rash  ? Penicillins Rash  ? ? ?Current Medications:  ? ?Current  Outpatient Medications:  ?  acetaminophen (TYLENOL) 325 MG tablet, Take 2 tablets (650 mg total) by mouth every 4 (four) hours as needed for headache or mild pain., Disp: , Rfl:  ?  ALPRAZolam (XANAX) 1 MG tablet, TAKE 1/2 TO 1 TABLET BY MOUTH TWO TIMES A DAY AS NEEDED FOR ANXIETY. Use sparingly., Disp: 60 tablet, Rfl: 0 ?  amphetamine-dextroamphetamine (ADDERALL XR) 10 MG 24 hr capsule, Take 1 capsule (10 mg total) by mouth 2 (two) times daily with breakfast and lunch., Disp: 60 capsule, Rfl: 0 ?  ARIPiprazole (ABILIFY) 10 MG tablet, TAKE ONE TABLET BY MOUTH DAILY, Disp: 90 tablet, Rfl: 3 ?  aspirin EC 81 MG EC tablet, Take 1 tablet (81 mg total) by mouth daily. Swallow whole., Disp: 30 tablet, Rfl: 11 ?  buPROPion (WELLBUTRIN XL) 300 MG 24 hr tablet, Take 1 tablet (300 mg total) by mouth daily., Disp: 90 tablet, Rfl: 3 ?  Cholecalciferol (VITAMIN D3) 10 MCG (400 UNIT) CAPS, Take by mouth., Disp: , Rfl:  ?  clopidogrel (PLAVIX) 75 MG tablet, Take 1 tablet (75 mg total) by mouth daily. Take 4 tabs this pm for a loading dose, then 1 tab daily., Disp: 34 tablet, Rfl: 11 ?  Cyanocobalamin (VITAMIN B-12 PO), Take by mouth., Disp: , Rfl:  ?  cyclobenzaprine (FLEXERIL) 10 MG tablet, Take 0.5 - 1 tablet up to three times daily as needed for muscle spasm, Disp: 30 tablet, Rfl: 0 ?  esomeprazole (NEXIUM) 20 MG capsule, Take 20 mg by mouth daily at 12 noon., Disp: , Rfl:  ?  ketoconazole (NIZORAL) 2 % cream, APPLY TO AFFECTED AREA(S) ONCE TO TWICE DAILY, Disp: 30 g, Rfl: 0 ?  lisinopril (ZESTRIL) 2.5 MG tablet, Take 1 tablet (2.5 mg total) by mouth daily., Disp: 30 tablet, Rfl: 6 ?  metFORMIN (GLUCOPHAGE-XR) 500 MG 24 hr tablet, TAKE TWO TABLETS BY MOUTH DAILY WITH BREAKFAST, Disp: 60 tablet, Rfl: 2 ?  metoprolol succinate (TOPROL-XL) 25 MG 24 hr tablet, Take 1 tablet (25 mg total) by mouth daily., Disp: 30 tablet, Rfl: 6 ?  Multiple Vitamin (MULTIVITAMIN) tablet, Take 1 tablet by mouth daily., Disp: , Rfl:  ?  nitroGLYCERIN  (NITROSTAT) 0.4 MG SL tablet, Place 1 tablet (0.4 mg total) under the tongue every 5 (five) minutes x 3 doses as needed for chest pain., Disp: 25 tablet, Rfl: 12 ?  Omega-3 Fatty Acids (FISH OIL PO), Take by mouth., Disp: , Rfl:  ?  rosuvastatin (CRESTOR) 40 MG tablet, Take 1 tablet (40 mg total) by mouth daily., Disp: 30 tablet, Rfl:  6 ?  triamcinolone ointment (KENALOG) 0.5 %, Apply to affected area 1-2 times daily, Disp: 15 g, Rfl: 0 ?  venlafaxine XR (EFFEXOR-XR) 150 MG 24 hr capsule, TAKE TWO CAPSULES BY MOUTH EVERY MORNING WITH BREAKFAST, Disp: 180 capsule, Rfl: 3  ? ?Review of Systems:  ? ?ROS ?Negative unless otherwise specified per HPI. ? ?Vitals:  ? ?Vitals:  ? 02/08/22 1104  ?BP: 104/68  ?Pulse: 69  ?Temp: 98.1 ?F (36.7 ?C)  ?TempSrc: Temporal  ?SpO2: 96%  ?Weight: 215 lb 3.2 oz (97.6 kg)  ?   ?Body mass index is 33.71 kg/m?. ? ?Physical Exam:  ? ?Physical Exam ?Vitals and nursing note reviewed.  ?Constitutional:   ?   General: She is not in acute distress. ?   Appearance: She is well-developed. She is not ill-appearing or toxic-appearing.  ?Cardiovascular:  ?   Rate and Rhythm: Normal rate and regular rhythm.  ?   Pulses: Normal pulses.  ?   Heart sounds: Normal heart sounds, S1 normal and S2 normal.  ?Pulmonary:  ?   Effort: Pulmonary effort is normal.  ?   Breath sounds: Normal breath sounds.  ?Musculoskeletal:  ?   Cervical back: Normal, full passive range of motion without pain and normal range of motion.  ?   Comments: Normal ROM  ?No significant arm weakness   ?Skin: ?   General: Skin is warm and dry.  ?Neurological:  ?   Mental Status: She is alert.  ?   GCS: GCS eye subscore is 4. GCS verbal subscore is 5. GCS motor subscore is 6.  ?Psychiatric:     ?   Speech: Speech normal.     ?   Behavior: Behavior normal. Behavior is cooperative.  ? ? ?Assessment and Plan:  ? ?Chronic low back pain, unspecified back pain laterality, unspecified whether sciatica present ?No red flags  ?Not responding to our  treatment -- placed referral to Dr. Georgina Snell, sports medicine for further evaluation ? ?Numbness and tingling of right arm ?No red flags -- suspect possible cervical or brachial plexus issue ?Restart gabapentin 100

## 2022-02-11 ENCOUNTER — Ambulatory Visit (INDEPENDENT_AMBULATORY_CARE_PROVIDER_SITE_OTHER)
Admission: RE | Admit: 2022-02-11 | Discharge: 2022-02-11 | Disposition: A | Discharge status: Home / Self Care | Payer: Medicare Other | Source: Ambulatory Visit | Attending: Physician Assistant | Admitting: Physician Assistant

## 2022-02-11 ENCOUNTER — Other Ambulatory Visit: Payer: Self-pay

## 2022-02-11 DIAGNOSIS — R200 Anesthesia of skin: Secondary | ICD-10-CM

## 2022-02-11 DIAGNOSIS — R202 Paresthesia of skin: Secondary | ICD-10-CM

## 2022-02-12 NOTE — Progress Notes (Signed)
? ? ?Subjective:   ? ?CC: Low back pain and R arm paresthesias ? ?I, Wendy Poet, LAT, ATC, am serving as scribe for Dr. [name]. ? ?HPI: Pt is a 46 y/o female presenting w/ c/o chronic low back pain and newer-onset R arm paresthesias/numbness. ? ?Low back pain: Chronic issue previously managed w/ oral diclofenac '75mg'$  BID prior to MI but had to stop after her MI.  Locates pain to the R side of her lower back. ?-Radiating pain: yes into her entire lower back and into her B legs, R>L ?-LE paresthesias: no ?-Aggravating factors: prolonged standing >15-20 min; prolonged sitting; lumbar flexion ?-Treatments tried: Diclofenac, Flexeril 10 mg; Voltaren gel;  ? ?R arm numbness: Intermittent x approximately one month but becoming more frequent.  Noticed after being hospitalized for an MI occurring on 12/27/21.  She locates her symptoms to her entire R arm. ?-Neck pain: yes ?-Paresthesias: yes- complete numbness in her R arm mainly at night but will happen randomly during the day ?-R UE weakness- no ?-Aggravating factors: laying on R side at night;  ?-Treatments tried: HEP per Sam; getting off of her R arm at night ? ?She also reports newer-onset R knee pain. ? ?Diagnostic testing: C-spine XR- 02/11/22; L-spine XR- 12/23/18 ? ? ?Pertinent review of Systems: No fevers or chills ? ?Relevant historical information: Recent NSTEMI with right radial artery catheterization with stent ? ? ?Objective:   ? ?Vitals:  ? 02/13/22 0843  ?BP: 110/80  ?Pulse: 75  ?SpO2: 96%  ? ?General: Well Developed, well nourished, and in no acute distress.  ? ?MSK:  ?C-spine: Normal-appearing ?Nontender midline. ?Normal cervical motion. ?Negative Spurling's test. ?Upper extremity strength reflexes and sensation are equal and normal throughout bilateral upper extremities. ? ?Right wrist normal-appearing ?No bruising. ?Normal wrist motion and strength. ?Positive Tinel's and carpal tunnel ?Positive Phalen's test.  With Phalen's test she starts to  experience some numbness and paresthesias proximally up the forearm and into the upper arm. ? ?L-spine: Nontender midline.  Decreased lumbar motion. ?Lower extremity strength reflexes and sensation are equal and normal throughout. ? ? ?Lab and Radiology Results ?No results found for this or any previous visit (from the past 72 hour(s)). ?DG Cervical Spine Complete ? ?Result Date: 02/12/2022 ?CLINICAL DATA:  Right arm numbness and right neck pain, no known injury, initial encounter EXAM: CERVICAL SPINE - COMPLETE 4+ VIEW COMPARISON:  None. FINDINGS: Seven cervical segments are well visualized. Loss of the normal cervical lordosis is noted. Degenerative changes are seen with osteophytic change at C5-6 and to a lesser degree at C6-7. Neural foramina are widely patent bilaterally. No odontoid abnormality is seen. IMPRESSION: Degenerative change without acute abnormality. Electronically Signed   By: Inez Catalina M.D.   On: 02/12/2022 21:14   ? ?  ?EXAM: ?LUMBAR SPINE - COMPLETE 4+ VIEW ?  ?COMPARISON:  05/22/2018. ?  ?FINDINGS: ?Paraspinal soft tissues are normal. Diffuse multilevel degenerative ?change. No acute bony abnormality identified. No evidence of ?fracture. ?  ?IMPRESSION: ?Diffuse multilevel degenerative change.  No acute abnormality. ?  ?  ?Electronically Signed ?  By: [place] ?  On: 12/24/2018 08:15 ?  ?I, [name], personally (independently) visualized and performed the interpretation of the images attached in this note. ? ?X-ray images right knee obtained today ? ?Impression and Recommendations:   ? ?Assessment and Plan: ?46 y.o. female with  ?Marland Kitchen ?Right arm paresthesias and numbness worse with sleep.  This is concerning for either cervical radiculopathy at C6-7  and 8 or perhaps an atypical pattern of carpal tunnel syndrome. ?On physical exam the carpal tunnel syndrome testing was provocative with some symptoms progressing more proximally up the arm indicating that her symptoms are more likely to  be due to carpal tunnel syndrome.  She did have a radial artery cardiac catheterization a month ago which either itself could have irritated the median nerve or the compression to stop or prevent bleeding after the catheterization could have irritated the median nerve. ? ?We will treat conservatively with carpal tunnel wrist brace and gabapentin at bedtime.  She already is taking 100 mg of gabapentin 3 times daily.  If needed she will let me know and I can increase the dose of gabapentin. ?Check back in 1 month if not better consider injection. ? ?Low back pain with some pain radiating down the right leg in an L5 dermatomal pattern.  Pain due to DJD and muscle spasm and dysfunction.  Plan for physical therapy.  Recheck in 1 month. ? ?Knee pain: This issue was deferred today but will obtain x-ray and refer to physical therapy as well.  Check on this issue more thoroughly next month. ? ?PDMP not reviewed this encounter. ?Orders Placed This Encounter  ?Procedures  ? DG Knee AP/LAT W/[place] Right  ?  Standing Status:   Future  ?  Standing Expiration Date:   03/16/2022  ?  Order Specific Question:   Reason for Exam (SYMPTOM  OR DIAGNOSIS REQUIRED)  ?  Answer:   R knee pain  ?  Order Specific Question:   Is patient pregnant?  ?  Answer:   No  ?  Order Specific Question:   Preferred imaging location?  ?  Answer:   [facility]  ? Ambulatory referral to Physical Therapy  ?  Referral Priority:   Routine  ?  Referral Type:   Physical Medicine  ?  Referral Reason:   Specialty Services Required  ?  Requested Specialty:   Physical Therapy  ?  Number of Visits Requested:   1  ? ?No orders of the defined types were placed in this encounter. ? ? ?Discussed warning signs or symptoms. Please see discharge instructions. Patient expresses understanding. ? ? ?The above documentation has been reviewed and is accurate and complete [name], M.D. ? ?

## 2022-02-13 ENCOUNTER — Ambulatory Visit (INDEPENDENT_AMBULATORY_CARE_PROVIDER_SITE_OTHER): Payer: Medicare Other

## 2022-02-13 ENCOUNTER — Encounter: Payer: Self-pay | Admitting: Family Medicine

## 2022-02-13 ENCOUNTER — Ambulatory Visit (INDEPENDENT_AMBULATORY_CARE_PROVIDER_SITE_OTHER): Payer: Medicare Other | Admitting: Family Medicine

## 2022-02-13 VITALS — BP 110/80 | HR 75 | Ht 67.0 in | Wt 219.2 lb

## 2022-02-13 DIAGNOSIS — G8929 Other chronic pain: Secondary | ICD-10-CM | POA: Diagnosis not present

## 2022-02-13 DIAGNOSIS — M5441 Lumbago with sciatica, right side: Secondary | ICD-10-CM | POA: Diagnosis not present

## 2022-02-13 DIAGNOSIS — M25561 Pain in right knee: Secondary | ICD-10-CM

## 2022-02-13 DIAGNOSIS — G5601 Carpal tunnel syndrome, right upper limb: Secondary | ICD-10-CM

## 2022-02-13 NOTE — Patient Instructions (Addendum)
Nice to meet you today. ? ?I've referred you to Physical Therapy.  Their office will call you to schedule but please let us know if you haven't heard from them in one week regarding scheduling. ? ?Get a carpal tunnel wrist brace to wear at night. ? ?Please get an Xray today before you leave. ? ?Let me know if you want me to increase your dose of Gabapentin prior to your follow-up. ? ?Follow-up: one month ?

## 2022-02-14 ENCOUNTER — Ambulatory Visit (INDEPENDENT_AMBULATORY_CARE_PROVIDER_SITE_OTHER): Payer: Medicare Other | Admitting: Family Medicine

## 2022-02-14 ENCOUNTER — Encounter (INDEPENDENT_AMBULATORY_CARE_PROVIDER_SITE_OTHER): Payer: Self-pay | Admitting: Family Medicine

## 2022-02-14 VITALS — BP 99/64 | HR 60 | Temp 97.9°F | Ht 66.0 in | Wt 214.0 lb

## 2022-02-14 DIAGNOSIS — G8929 Other chronic pain: Secondary | ICD-10-CM | POA: Diagnosis not present

## 2022-02-14 DIAGNOSIS — F908 Attention-deficit hyperactivity disorder, other type: Secondary | ICD-10-CM

## 2022-02-14 DIAGNOSIS — I214 Non-ST elevation (NSTEMI) myocardial infarction: Secondary | ICD-10-CM

## 2022-02-14 DIAGNOSIS — E669 Obesity, unspecified: Secondary | ICD-10-CM

## 2022-02-14 DIAGNOSIS — M5441 Lumbago with sciatica, right side: Secondary | ICD-10-CM

## 2022-02-14 DIAGNOSIS — Z6834 Body mass index (BMI) 34.0-34.9, adult: Secondary | ICD-10-CM

## 2022-02-14 NOTE — Progress Notes (Signed)
Right knee x-ray shows a little bit of arthritis changes.

## 2022-02-14 NOTE — Therapy (Signed)
?OUTPATIENT PHYSICAL THERAPY THORACOLUMBAR EVALUATION ? ? ?Patient Name: Crystal Bean ?MRN: [id number] ?DOB:1976-01-28, 46 y.o., female ?Today's Date: 02/18/2022 ? ? PT End of Session - 02/18/22 1017   ? ? Visit Number 1   ? Number of Visits 12   ? Date for PT Re-Evaluation 04/01/22   ? Authorization Type medicare   ? Progress Note Due on Visit 10   ? PT Start Time 1018   ? PT Stop Time 9629   ? PT Time Calculation (min) 31 min   ? Activity Tolerance Patient tolerated treatment well   ? ?  ?  ? ?  ? ? ?Past Medical History:  ?Diagnosis Date  ? ADHD (attention deficit hyperactivity disorder)   ? Anxiety   ? B12 deficiency   ? Back pain   ? Bipolar 1 disorder ([place])   ? Bulging lumbar disc   ? C. difficile diarrhea 04/24/2016  ? treated with metronidazole  ? Constipation   ? Depression   ? GERD (gastroesophageal reflux disease)   ? History of chicken pox   ? History of shingles 2013  ? Hypertension   ? IBS (irritable bowel syndrome)   ? Joint pain   ? Major depressive disorder   ? Migraines   ? Obesity   ? Obsessive-compulsive disorder   ? Osteoarthritis   ? Prediabetes   ? PTSD (post-traumatic stress disorder)   ? Sleep apnea   ? SOB (shortness of breath)   ? ?Past Surgical History:  ?Procedure Laterality Date  ? BLADDER SURGERY  1981  ? Urethera stretched  ? caps on teeth  1980  ? Caps on all Teeth  ? CESAREAN SECTION  2003  ? CORONARY STENT INTERVENTION N/A 12/28/2021  ? Procedure: CORONARY STENT INTERVENTION;  Surgeon: Martinique, Peter M, MD;  Location: [facility];  Service: Cardiovascular;  Laterality: N/A;  ? LEFT HEART CATH AND CORONARY ANGIOGRAPHY N/A 12/28/2021  ? Procedure: LEFT HEART CATH AND CORONARY ANGIOGRAPHY;  Surgeon: Martinique, Peter M, MD;  Location: [facility];  Service: Cardiovascular;  Laterality: N/A;  ? TONSILLECTOMY    ? WISDOM TOOTH EXTRACTION    ? ?Patient Active Problem List  ? Diagnosis Date Noted  ? NSTEMI (non-ST elevated myocardial infarction) ([place]) 12/27/2021  ? Sore throat  11/08/2021  ? Mixed hyperlipidemia 06/05/2021  ? Vitamin D deficiency 06/05/2021  ? B12 deficiency 06/05/2021  ? Pre-diabetes 02/12/2021  ? Erythema nodosum 12/26/2020  ? Excessive daytime sleepiness 10/30/2020  ? Snoring 10/30/2020  ? Nocturia more than twice per night 10/30/2020  ? ADHD, adult residual type 10/30/2020  ? 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 Obsessive compulsive disorder 09/08/2018  ? Bipolar 1 disorder ([place]) 08/26/2018  ? Obesity 04/08/2018  ? Essential hypertension 04/08/2018  ? PTSD (post-traumatic stress disorder)   ? Osteoarthritis   ? Migraines   ? GERD (gastroesophageal reflux disease)   ? 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 Bulging lumbar disc   ? ? ?PCP: Inda Coke, PA ? ?REFERRING PROVIDER: [name], MD ? ?REFERRING DIAG:  ?M25.561 (ICD-10-CM) - Acute pain of right knee ?M54.41,G89.29 (ICD-10-CM) - Chronic bilateral low back pain with right-sided sciatica ? ?THERAPY DIAG:  ?Difficulty in walking, not elsewhere classified ? ?Muscle weakness (generalized) ? ?Other low back pain ? ?Chronic pain of right knee ? ?ONSET DATE: back pain has been years ? ?SUBJECTIVE:                                                                                                                                                                                          ? ?  SUBJECTIVE STATEMENT: ?States that she has had back pain for years and has done PT 2x. States that she felt good after PT. States that she has no space between L4/5 and when it gets bad she has pain into her right leg. States when it is really bad it shoots down both sides. Standing is the worse. States she can't sweep the floor. States she usually pushes through the pain. States that biking hurts and she likes to swim and she walks in the pool. States that she has a knot on her right and if you hit it it hurts really bad. States stairs are difficult and she cannot start with there right leg as it is  painful. ? ? ?PERTINENT HISTORY:  ?hx of MI- radial artery cardiac catherization in 12/28/21 ?Bipolar, depression  ? ?PAIN:  ?Are you having pain? Yes: NPRS scale: 8/10 ?Pain location: across low back and then down back of the leg  ?Pain description: throbbing, sharp ?Aggravating factors: standing > 15 minutes, prolonged sitting, bending ?Relieving factors: sitting, rest , swimming ? ? ?PRECAUTIONS: None ? ?WEIGHT BEARING RESTRICTIONS No ? ?FALLS:  ?Has patient fallen in last 6 months? No ? ? 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 ?PLOF: Independent ? ?PATIENT GOALS be able to vacuum and mop without pain ? ? ?OBJECTIVE:  ? ?DIAGNOSTIC FINDINGS:   ?R KNEE XRAY: ?IMPRESSION: ?No acute fracture or dislocation ? ? ? ?SCREENING FOR RED FLAGS: ?Bowel or bladder incontinence: No ?Spinal tumors: No ?Cauda equina syndrome: No ?Compression fracture: No ?Abdominal aneurysm: No ? ?COGNITION: ? Overall cognitive status: Within functional limits for tasks assessed   ?  ?SENSATION: ?WFL ? ?MUSCLE LENGTH: ?Hamstrings: Right 30 deg; Left 30 deg ? ? ?POSTURE:  ?Limited lumbo pelvic motion, forward head and kyphotic posture ? ?PALPATION: ?hypersensitivity over L4/5, tenderness to palpation along right lumbar paraspinals and glutes ? ?LUMBAR ROM:  ? ?Active  A/PROM  ?02/18/2022  ?Flexion 75% limited (limited motion due to pain)  ?Extension 75% limited  ?Right lateral flexion 50% limited  ?Left lateral flexion 50% limited  ?Right rotation   ?Left rotation   ? (Blank rows = not tested) ?  No lumbar/pelvic motion noted with any motions ? ? LE Measurements ?Lower Extremity Right ?02/18/2022 Left ?02/18/2022  ? A/PROM MMT A/PROM MMT  ?Hip Flexion 105* 4 110 4+  ?Hip Extension  4  4  ?Hip Abduction      ?Hip Adduction      ?Hip Internal rotation 15*  15   ?Hip External rotation 50  50   ?Knee Flexion  4  4  ?Knee Extension  4+  4+  ?Ankle Dorsiflexion  5  5  ?Ankle Plantarflexion      ?Ankle Inversion      ?Ankle Eversion       ? (Blank rows = not tested) ? * pain ? ? ?LUMBAR SPECIAL TESTS:  ?Straight leg raise test: Negative, Slump test: Negative, and FABER test: Negative, Ely's negative but limited motion noted bilaterally with L worse then R, repeated prone extensions - no change in symptoms ? ? ? ? ? ?TODAY'S TREATMENT  ?02/18/2022 ?Therapeutic Exercise: ? Aerobic: ?Supine: ?Prone: prone press ups x15" holds ? Seated: ? Standing: ?Neuromuscular Re-education: ?Manual Therapy: ?Therapeutic Activity: ?Self Care: ?Trigger Point Dry Needling:  ?Modalities:  ? ? ? ?PATIENT EDUCATION:  ?Education details: on current presentation, on HEP, on clinical outcomes score and POC on hip hinge motion and glute activation ?Person educated:  Patient ?Education method: Explanation, Demonstration, and Handouts ?Education comprehension: verbalized understanding ? ? ?HOME EXERCISE PROGRAM: ?[id number] ? ?ASSESSMENT: ? ?CLINICAL IMPRESSION: ?Patient is a 46 y.o. female who was seen today for physical therapy evaluation and treatment for low back pain and right knee pain. Patient presents today with limitations in ROM and strength and limited lumbopelvic movements that limit patient's overall function. Patient with positive response from PT previously for treatment of low back pain and would greatly benefit from skilled PT to improve overall function and quality of life.  ? ? ?OBJECTIVE IMPAIRMENTS decreased activity tolerance, decreased mobility, difficulty walking, decreased ROM, decreased strength, postural dysfunction, and pain.  ? ?ACTIVITY LIMITATIONS cleaning, meal prep, yard work, and shopping.  ? ?PERSONAL FACTORS Age and Time since onset of injury/illness/exacerbation are also affecting patient's functional outcome.  ? ? ?REHAB POTENTIAL: Good ? ?CLINICAL DECISION MAKING: Stable/uncomplicated ? ?EVALUATION COMPLEXITY: Low ? ? ?GOALS: ?Goals reviewed with patient?  yes ? ?SHORT TERM GOALS: ? ?Patient will be independent in self management strategies to  improve quality of life and functional outcomes. ?Baseline: new program ?Target date: 03/11/2022 ?Goal status: INITIAL ? ?2.  Patient will report at least 50% improvement in overall symptoms and/or function to demons

## 2022-02-15 ENCOUNTER — Ambulatory Visit (INDEPENDENT_AMBULATORY_CARE_PROVIDER_SITE_OTHER): Payer: Medicare Other

## 2022-02-15 VITALS — BP 110/64 | HR 77 | Temp 98.0°F | Wt 217.6 lb

## 2022-02-15 DIAGNOSIS — Z0000 Encounter for general adult medical examination without abnormal findings: Secondary | ICD-10-CM

## 2022-02-15 NOTE — Patient Instructions (Signed)
Crystal Bean , ?Thank you for taking time to come for your Medicare Wellness Visit. I appreciate your ongoing commitment to your health goals. Please review the following plan we discussed and let me know if I can assist you in the future.  ? ?Screening recommendations/referrals: ?Colonoscopy: Pt declined at this time ?Mammogram: Done 02/22/21 repeat every year  ?Recommended yearly ophthalmology/optometry visit for glaucoma screening and checkup ?Recommended yearly dental visit for hygiene and checkup ? ?Vaccinations: ?Influenza vaccine: Done 08/16/21 repeat every year  ?Tdap vaccine: Done 02/12/13 repeat every 10 years   ?Covid-19: Completed 3/19, 02/28/10/24/21 & 12/13/21 ? ?Advanced directives: Advance directive discussed with you today. I have provided a copy for you to complete at home and have notarized. Once this is complete please bring a copy in to our office so we can scan it into your chart. ? ?Conditions/risks identified: Lose weight  ? ?Next appointment: Follow up in one year for your annual wellness visit.  ?Preventive Care 40-64 Years, Female ?Preventive care refers to lifestyle choices and visits with your health care provider that can promote health and wellness. ?What does preventive care include? ?A yearly physical exam. This is also called an annual well check. ?Dental exams once or twice a year. ?Routine eye exams. Ask your health care provider how often you should have your eyes checked. ?Personal lifestyle choices, including: ?Daily care of your teeth and gums. ?Regular physical activity. ?Eating a healthy diet. ?Avoiding tobacco and drug use. ?Limiting alcohol use. ?Practicing safe sex. ?Taking low-dose aspirin daily starting at age 20. ?Taking vitamin and mineral supplements as recommended by your health care provider. ?What happens during an annual well check? ?The services and screenings done by your health care provider during your annual well check will depend on your age, overall health,  lifestyle risk factors, and family history of disease. ?Counseling  ?Your health care provider may ask you questions about your: ?Alcohol use. ?Tobacco use. ?Drug use. ?Emotional well-being. ?Home and relationship well-being. ?Sexual activity. ?Eating habits. ?Work and work Statistician. ?Method of birth control. ?Menstrual cycle. ?Pregnancy history. ?Screening  ?You may have the following tests or measurements: ?Height, weight, and BMI. ?Blood pressure. ?Lipid and cholesterol levels. These may be checked every 5 years, or more frequently if you are over 51 years old. ?Skin check. ?Lung cancer screening. You may have this screening every year starting at age 42 if you have a 30-pack-year history of smoking and currently smoke or have quit within the past 15 years. ?Fecal occult blood test (FOBT) of the stool. You may have this test every year starting at age 37. ?Flexible sigmoidoscopy or colonoscopy. You may have a sigmoidoscopy every 5 years or a colonoscopy every 10 years starting at age 58. ?Hepatitis C blood test. ?Hepatitis B blood test. ?Sexually transmitted disease (STD) testing. ?Diabetes screening. This is done by checking your blood sugar (glucose) after you have not eaten for a while (fasting). You may have this done every 1-3 years. ?Mammogram. This may be done every 1-2 years. Talk to your health care provider about when you should start having regular mammograms. This may depend on whether you have a family history of breast cancer. ?BRCA-related cancer screening. This may be done if you have a family history of breast, ovarian, tubal, or peritoneal cancers. ?Pelvic exam and Pap test. This may be done every 3 years starting at age 40. Starting at age 60, this may be done every 5 years if you have a Pap test in  combination with an HPV test. ?Bone density scan. This is done to screen for osteoporosis. You may have this scan if you are at high risk for osteoporosis. ?Discuss your test results, treatment  options, and if necessary, the need for more tests with your health care provider. ?Vaccines  ?Your health care provider may recommend certain vaccines, such as: ?Influenza vaccine. This is recommended every year. ?Tetanus, diphtheria, and acellular pertussis (Tdap, Td) vaccine. You may need a Td booster every 10 years. ?Zoster vaccine. You may need this after age 61. ?Pneumococcal 13-valent conjugate (PCV13) vaccine. You may need this if you have certain conditions and were not previously vaccinated. ?Pneumococcal polysaccharide (PPSV23) vaccine. You may need one or two doses if you smoke cigarettes or if you have certain conditions. ?Talk to your health care provider about which screenings and vaccines you need and how often you need them. ?This information is not intended to replace advice given to you by your health care provider. Make sure you discuss any questions you have with your health care provider. ?Document Released: 12/01/2015 Document Revised: 07/24/2016 Document Reviewed: 09/05/2015 ?Elsevier Interactive Patient Education ? 2017 Elsevier Inc. ? ? ? ?Fall Prevention in the Home ?Falls can cause injuries. They can happen to people of all ages. There are many things you can do to make your home safe and to help prevent falls. ?What can I do on the outside of my home? ?Regularly fix the edges of walkways and driveways and fix any cracks. ?Remove anything that might make you trip as you walk through a door, such as a raised step or threshold. ?Trim any bushes or trees on the path to your home. ?Use bright outdoor lighting. ?Clear any walking paths of anything that might make someone trip, such as rocks or tools. ?Regularly check to see if handrails are loose or broken. Make sure that both sides of any steps have handrails. ?Any raised decks and porches should have guardrails on the edges. ?Have any leaves, snow, or ice cleared regularly. ?Use sand or salt on walking paths during winter. ?Clean up any  spills in your garage right away. This includes oil or grease spills. ?What can I do in the bathroom? ?Use night lights. ?Install grab bars by the toilet and in the tub and shower. Do not use towel bars as grab bars. ?Use non-skid mats or decals in the tub or shower. ?If you need to sit down in the shower, use a plastic, non-slip stool. ?Keep the floor dry. Clean up any water that spills on the floor as soon as it happens. ?Remove soap buildup in the tub or shower regularly. ?Attach bath mats securely with double-sided non-slip rug tape. ?Do not have throw rugs and other things on the floor that can make you trip. ?What can I do in the bedroom? ?Use night lights. ?Make sure that you have a light by your bed that is easy to reach. ?Do not use any sheets or blankets that are too big for your bed. They should not hang down onto the floor. ?Have a firm chair that has side arms. You can use this for support while you get dressed. ?Do not have throw rugs and other things on the floor that can make you trip. ?What can I do in the kitchen? ?Clean up any spills right away. ?Avoid walking on wet floors. ?Keep items that you use a lot in easy-to-reach places. ?If you need to reach something above you, use a strong step stool  that has a grab bar. ?Keep electrical cords out of the way. ?Do not use floor polish or wax that makes floors slippery. If you must use wax, use non-skid floor wax. ?Do not have throw rugs and other things on the floor that can make you trip. ?What can I do with my stairs? ?Do not leave any items on the stairs. ?Make sure that there are handrails on both sides of the stairs and use them. Fix handrails that are broken or loose. Make sure that handrails are as long as the stairways. ?Check any carpeting to make sure that it is firmly attached to the stairs. Fix any carpet that is loose or worn. ?Avoid having throw rugs at the top or bottom of the stairs. If you do have throw rugs, attach them to the floor  with carpet tape. ?Make sure that you have a light switch at the top of the stairs and the bottom of the stairs. If you do not have them, ask someone to add them for you. ?What else can I do to help prevent falls?
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
## 2022-02-16 LAB — LIPID PANEL
Chol/HDL Ratio: 2.2 ratio (ref 0.0–4.4)
Cholesterol, Total: 84 mg/dL — ABNORMAL LOW (ref 100–199)
HDL: 39 mg/dL — ABNORMAL LOW (ref >39)
LDL Chol Calc (NIH): 30 mg/dL (ref 0–99)
Triglycerides: 68 mg/dL (ref 0–149)
VLDL Cholesterol Cal: 15 mg/dL (ref 5–40)

## 2022-02-16 LAB — HEPATIC FUNCTION PANEL
ALT: 13 IU/L (ref 0–32)
AST: 11 IU/L (ref 0–40)
Albumin: 4.2 g/dL (ref 3.8–4.8)
Alkaline Phosphatase: 86 IU/L (ref 44–121)
Bilirubin Total: 0.2 mg/dL (ref 0.0–1.2)
Bilirubin, Direct: <0.1 mg/dL (ref 0.00–0.40)
Total Protein: 6.9 g/dL (ref 6.0–8.5)

## 2022-02-18 ENCOUNTER — Ambulatory Visit: Payer: Medicare Other | Admitting: Physician Assistant

## 2022-02-18 ENCOUNTER — Ambulatory Visit (INDEPENDENT_AMBULATORY_CARE_PROVIDER_SITE_OTHER): Payer: Medicare Other | Admitting: Physical Therapy

## 2022-02-18 DIAGNOSIS — M5459 Other low back pain: Secondary | ICD-10-CM

## 2022-02-18 DIAGNOSIS — M6281 Muscle weakness (generalized): Secondary | ICD-10-CM | POA: Diagnosis not present

## 2022-02-18 DIAGNOSIS — G8929 Other chronic pain: Secondary | ICD-10-CM

## 2022-02-18 DIAGNOSIS — R262 Difficulty in walking, not elsewhere classified: Secondary | ICD-10-CM | POA: Diagnosis not present

## 2022-02-18 DIAGNOSIS — M25561 Pain in right knee: Secondary | ICD-10-CM

## 2022-02-19 NOTE — Progress Notes (Signed)
Chief Complaint:   OBESITY Crystal Bean is here to discuss her progress with her obesity treatment plan along with follow-up of her obesity related diagnoses.   Today's visit was #: 59 Starting weight: 252 lbs Starting date: 01/02/2021 Today's weight: 214 lbs Today's date: 02/14/2022 Weight change since last visit: +3 lbs Total lbs lost to date: 38 lbs Body mass index is 34.54 kg/m.  Total weight loss percentage to date: -15.08%  Current Meal Plan: the Category 2 Plan for 85-90% of the time.  Current Exercise Plan: Cardio for 30/45 minutes 2-3 times per week.  Interim History: Crystal Bean says she is enjoying the variety of Every Plate dinners.  Assessment/Plan:   1. NSTEMI (non-ST elevated myocardial infarction) [facility]) Reviewed recent ED and follow up visits. She was admitted with NSTEMI 12/27/2021.  Cath Lab was noted to have single-vessel CAD.  She underwent PCI with DES x 1 to her proximal LAD and that was 80% stenosed.  Tolerating medications well and denies side effects.  Plan: Continue aspirin, Plavix, lisinopril, metoprolol, omega-3 fatty acids. She will continue to focus on protein-rich, low simple carbohydrate foods. We reviewed the importance of hydration, regular exercise for stress reduction, and restorative sleep.   2. Chronic right-sided low back pain with right-sided sciatica Crystal Bean is going to PT.  Will continue to monitor as it relates to her weight loss journey.  3. ADHD, adult residual type Medication:Adderall XR 10 mg twice daily. Tolerating well and approved by her other providers.   Counseling ADHD is the most missed diagnosis in relation to food and appetite problems. Often the strong urge to binge or to self-medicate with food subsides once the impulsivity and inattention of ADHD are treated. A person can experience a new ability to tune in to the body's signals, control cravings and improve impulse control.  A  deficiency in norepinephrine and dopamine can lead to the following behaviors related to eating: Poor awareness of internal cues of hunger and satiety, or fullness. Inability to follow a meal plan. Inability to judge portion size accurately. Inability to stop bingeing or purging. Distraction by continual thoughts of food, weight and body shape. Increased desire to overeat, especially high calorie, "reward" type foods. Poor self-esteem due to repeated failures of self-control.  Plan: Continue Adderall. Will continue to monitor as it relates to her weight loss journey.  4. Obesity, BMI today 34.7 Course: Crystal Bean is currently in the action stage of change. As such, her goal is to continue with weight loss efforts.   Nutrition goals: She has agreed to the Category 2 Plan.   Exercise goals: As is.  Behavioral modification strategies: increasing lean protein intake, decreasing simple carbohydrates, increasing vegetables, and increasing water intake.  Crystal Bean has agreed to follow-up with our clinic in 6 weeks. She was informed of the importance of frequent follow-up visits to maximize her success with intensive lifestyle modifications for her multiple health conditions.   Objective:   Blood pressure 99/64, pulse 60, temperature 97.9 F (36.6 C), temperature source Oral, height '5\' 6"'$  (1.676 m), weight 214 lb (97.1 kg), last menstrual period 02/13/2022, SpO2 98 %. Body mass index is 34.54 kg/m.  General: Cooperative, alert, well developed, in no acute distress. HEENT: Conjunctivae and lids unremarkable. Cardiovascular: Regular rhythm.  Lungs: Normal work of breathing. Neurologic: No focal deficits.   Lab Results  Component Value Date   CREATININE 0.98 01/28/2022   BUN 16 01/28/2022   NA 134 (L) 01/28/2022   K 4.7 01/28/2022   CL 99 01/28/2022   CO2 27 01/28/2022   Lab Results  Component Value Date   ALT 13 02/15/2022   AST 11 02/15/2022   ALKPHOS 86 02/15/2022   BILITOT 0.2  02/15/2022   Lab Results  Component Value Date   HGBA1C 5.2 12/27/2021   HGBA1C 5.7 (H) 05/29/2021   HGBA1C 5.9 (H) 01/02/2021   HGBA1C 5.7 (H) 09/20/2020   HGBA1C 6.0 10/18/2019   Lab Results  Component Value Date   INSULIN 7.7 05/29/2021   INSULIN 14.4 01/02/2021   Lab Results  Component Value Date   TSH 2.24 01/09/2022   Lab Results  Component Value Date   CHOL 84 (L) 02/15/2022   HDL 39 (L) 02/15/2022   LDLCALC 30 02/15/2022   LDLDIRECT 111.0 10/18/2019   TRIG 68 02/15/2022   CHOLHDL 2.2 02/15/2022   Lab Results  Component Value Date   VD25OH 42.8 05/29/2021   VD25OH 42.2 01/02/2021   VD25OH 29.6 11/28/2015   Lab Results  Component Value Date   WBC 11.0 (H) 01/28/2022   HGB 12.8 01/28/2022   HCT 38.3 01/28/2022   MCV 86.7 01/28/2022   PLT 383.0 01/28/2022   Attestation Statements:   Reviewed by clinician on day of visit: allergies, medications, problem list, medical history, surgical history, family history, social history, and previous encounter notes.  Leodis Binet Friedenbach, CMA, am acting as Location manager for [employer], DO.  I have reviewed the above documentation for accuracy and completeness, and I agree with the above. -  Briscoe Deutscher, DO, MS, FAAFP, DABOM - Family and Bariatric Medicine.

## 2022-02-21 ENCOUNTER — Encounter: Payer: Self-pay | Admitting: Physical Therapy

## 2022-02-21 ENCOUNTER — Encounter: Payer: Self-pay | Admitting: Physician Assistant

## 2022-02-21 ENCOUNTER — Ambulatory Visit (INDEPENDENT_AMBULATORY_CARE_PROVIDER_SITE_OTHER): Payer: Medicare Other | Admitting: Physical Therapy

## 2022-02-21 DIAGNOSIS — M5459 Other low back pain: Secondary | ICD-10-CM

## 2022-02-21 DIAGNOSIS — M6281 Muscle weakness (generalized): Secondary | ICD-10-CM | POA: Diagnosis not present

## 2022-02-21 DIAGNOSIS — R262 Difficulty in walking, not elsewhere classified: Secondary | ICD-10-CM | POA: Diagnosis not present

## 2022-02-21 IMAGING — DX DG CHEST 2V
2 series · 2 of 2 positions shown · non-contrast
Comparison: None.

CLINICAL DATA: Right upper chest pain for 2 weeks. Posterior chest
soreness. Ex-smoker.

EXAM:
CHEST - 2 VIEW

[chest pa]
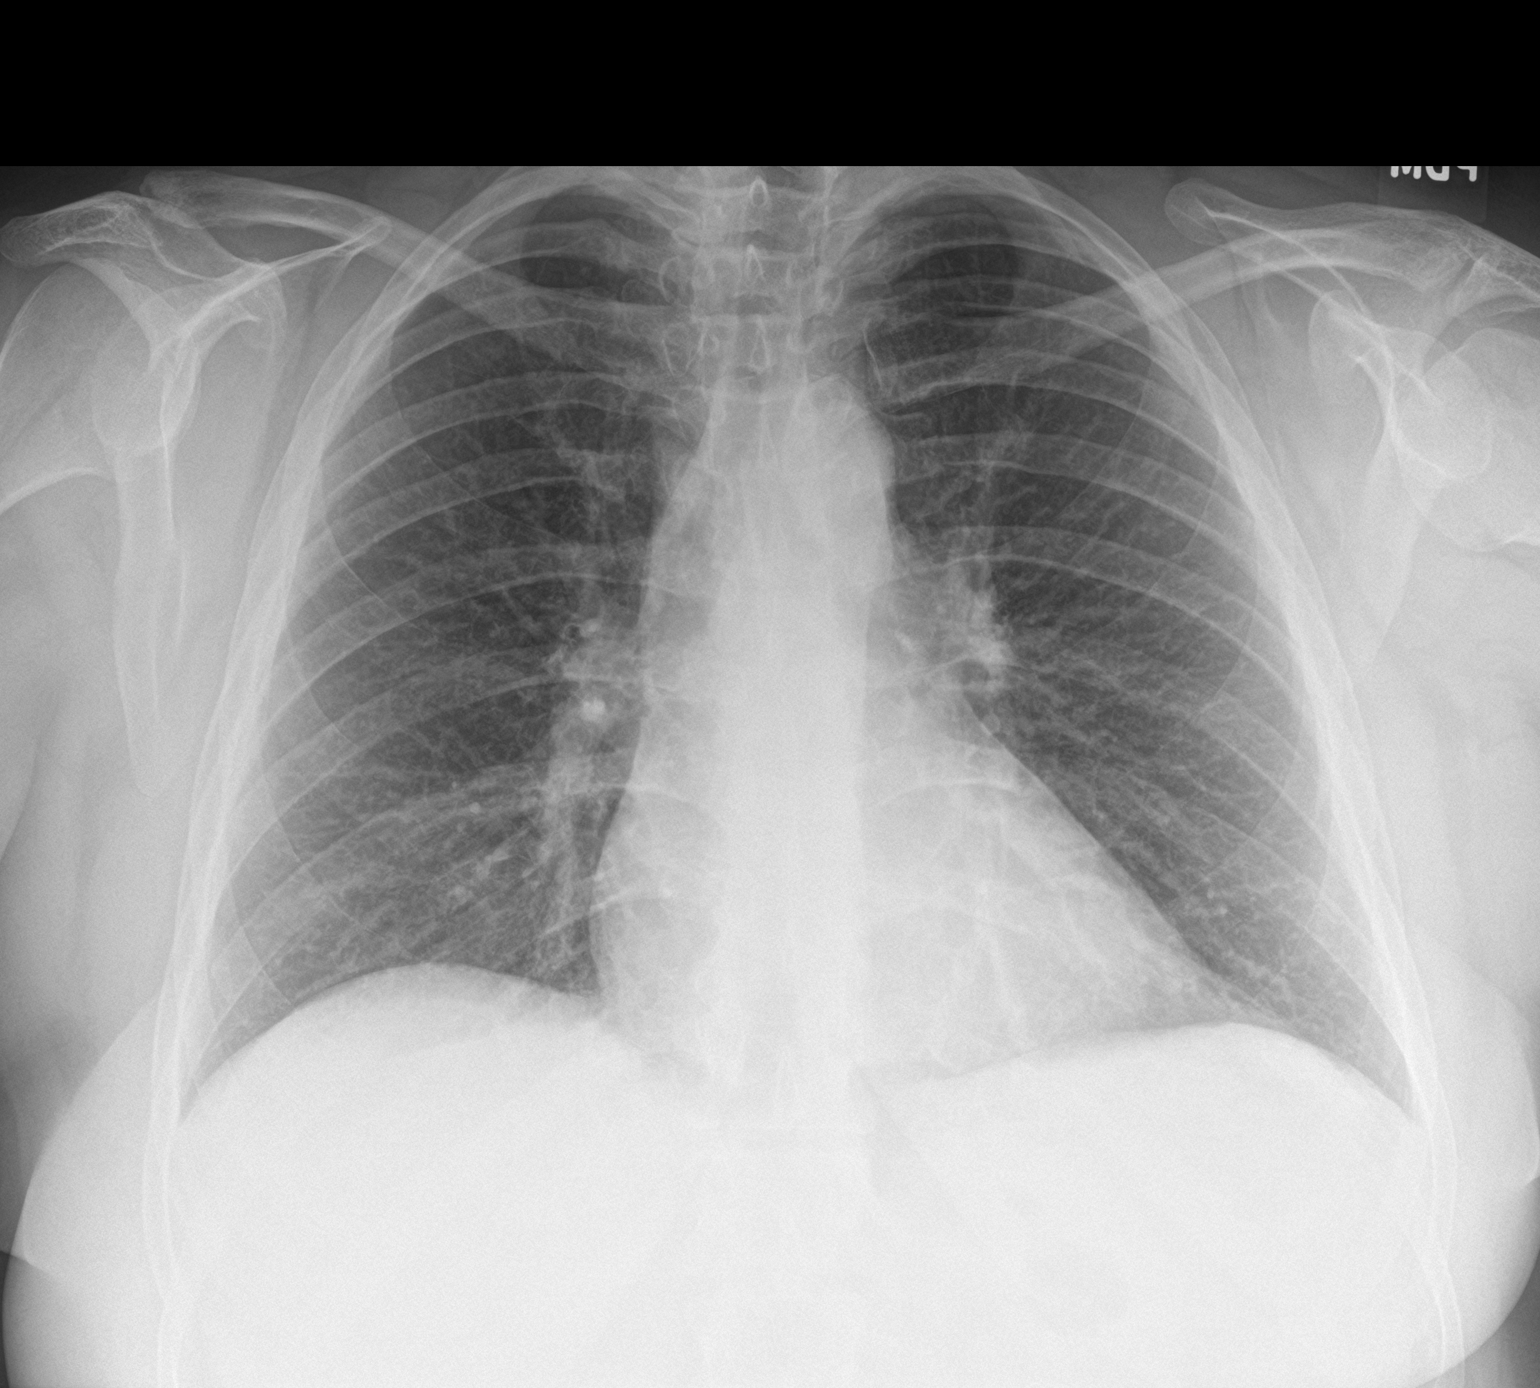

[chest lat]
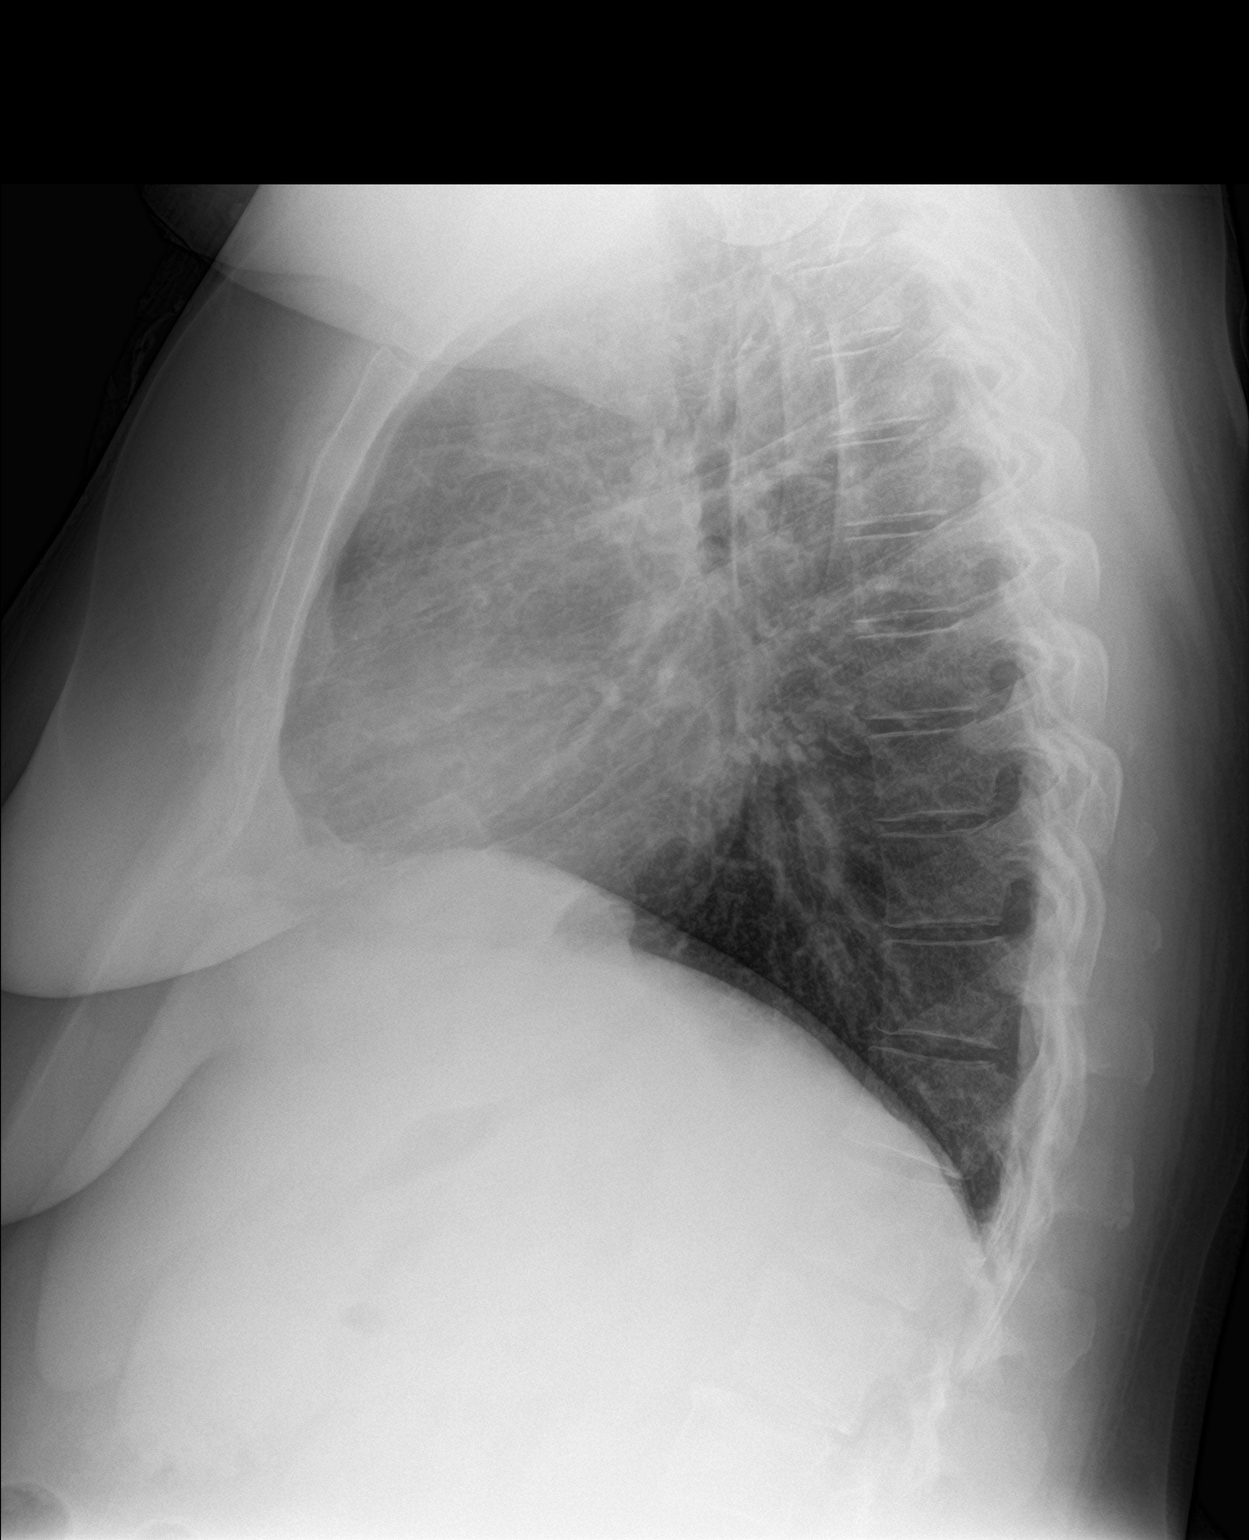

[2 of 2 positions shown; findings below may reference images not displayed]

FINDINGS: The cardiomediastinal contours are normal. There is mild bronchial
thickening. Pulmonary vasculature is normal. No consolidation,
pleural effusion, or pneumothorax. No acute osseous abnormalities
are seen.
IMPRESSION: Mild bronchial thickening.

## 2022-02-21 NOTE — Therapy (Signed)
?OUTPATIENT PHYSICAL THERAPY TREATMENT NOTE ? ? ?Patient Name: Crystal Bean ?MRN: [id number] ?DOB:1975-12-09, 46 y.o., female ?Today's Date: 02/21/2022 ? ?PCP: Inda Coke, PA ?REFERRING PROVIDER: Inda Coke, PA ? ?END OF SESSION:  ? PT End of Session - 02/21/22 0932   ? ? Visit Number 2   ? Number of Visits 12   ? Date for PT Re-Evaluation 04/01/22   ? Authorization Type medicare   ? Progress Note Due on Visit 10   ? PT Start Time [phone]   ? PT Stop Time 1011   ? PT Time Calculation (min) 38 min   ? Activity Tolerance Patient tolerated treatment well   ? ?  ?  ? ?  ? ? ?Past Medical History:  ?Diagnosis Date  ? ADHD (attention deficit hyperactivity disorder)   ? Anxiety   ? B12 deficiency   ? Back pain   ? Bipolar 1 disorder ([place])   ? Bulging lumbar disc   ? C. difficile diarrhea 04/24/2016  ? treated with metronidazole  ? Constipation   ? Depression   ? GERD (gastroesophageal reflux disease)   ? History of chicken pox   ? History of shingles 2013  ? Hypertension   ? IBS (irritable bowel syndrome)   ? Joint pain   ? Major depressive disorder   ? Migraines   ? Obesity   ? Obsessive-compulsive disorder   ? Osteoarthritis   ? Prediabetes   ? PTSD (post-traumatic stress disorder)   ? Sleep apnea   ? SOB (shortness of breath)   ? ?Past Surgical History:  ?Procedure Laterality Date  ? BLADDER SURGERY  1981  ? Urethera stretched  ? caps on teeth  1980  ? Caps on all Teeth  ? CESAREAN SECTION  2003  ? CORONARY STENT INTERVENTION N/A 12/28/2021  ? Procedure: CORONARY STENT INTERVENTION;  Surgeon: Martinique, Peter M, MD;  Location: [facility];  Service: Cardiovascular;  Laterality: N/A;  ? LEFT HEART CATH AND CORONARY ANGIOGRAPHY N/A 12/28/2021  ? Procedure: LEFT HEART CATH AND CORONARY ANGIOGRAPHY;  Surgeon: Martinique, Peter M, MD;  Location: [facility];  Service: Cardiovascular;  Laterality: N/A;  ? TONSILLECTOMY    ? WISDOM TOOTH EXTRACTION    ? ?Patient Active Problem List  ? Diagnosis Date Noted  ?  NSTEMI (non-ST elevated myocardial infarction) ([place]) 12/27/2021  ? Sore throat 11/08/2021  ? Mixed hyperlipidemia 06/05/2021  ? Vitamin D deficiency 06/05/2021  ? B12 deficiency 06/05/2021  ? Pre-diabetes 02/12/2021  ? Erythema nodosum 12/26/2020  ? Excessive daytime sleepiness 10/30/2020  ? Snoring 10/30/2020  ? Nocturia more than twice per night 10/30/2020  ? ADHD, adult residual type 10/30/2020  ? 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 Obsessive compulsive disorder 09/08/2018  ? Bipolar 1 disorder ([place]) 08/26/2018  ? Obesity 04/08/2018  ? Essential hypertension 04/08/2018  ? PTSD (post-traumatic stress disorder)   ? Osteoarthritis   ? Migraines   ? GERD (gastroesophageal reflux disease)   ? Major depressive disorder, recurrent episode with mixed features ([place])   ? Bulging lumbar disc   ? ?  ?PCP: Inda Coke, PA ?  ?REFERRING PROVIDER: [name], MD ?  ?REFERRING DIAG:  ?M25.561 (ICD-10-CM) - Acute pain of right knee ?M54.41,G89.29 (ICD-10-CM) - Chronic bilateral low back pain with right-sided sciatica ?  ?THERAPY DIAG:  ?Difficulty in walking, not elsewhere classified ?  ?Muscle weakness (generalized) ?  ?Other low back pain ?  ?Chronic pain of right knee ?  ?ONSET  DATE: back pain has been years ?  ?SUBJECTIVE:                                                                                                                                                                                          ?  ?SUBJECTIVE STATEMENT: ?02/21/2022 ?No current pain and tried the exercise but only did it on the floor. States that yesterday she had pain but it was tolerable. Tuesday was a rough day ? ? ? ?Eval:States that she has had back pain for years and has done PT 2x. States that she felt good after PT. States that she has no space between L4/5 and when it gets bad she has pain into her right leg. States when it is really bad it shoots down both sides. Standing is the worse. States she can't sweep the floor. States she usually  pushes through the pain. States that biking hurts and she likes to swim and she walks in the pool. States that she has a knot on her right and if you hit it it hurts really bad. States stairs are difficult and she cannot start with there right leg as it is painful. ?  ?  ?PERTINENT HISTORY:  ?hx of MI- radial artery cardiac catherization in 12/28/21 ?Bipolar, depression  ?  ?PAIN:  ?Are you having pain? Yes: NPRS scale: 8/10 ?Pain location: across low back and then down back of the leg  ?Pain description: throbbing, sharp ?Aggravating factors: standing > 15 minutes, prolonged sitting, bending ?Relieving factors: sitting, rest , swimming ?  ?  ?PRECAUTIONS: None ?  ?WEIGHT BEARING RESTRICTIONS No ?  ?FALLS:  ?Has patient fallen in last 6 months? No ?  ?  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  ?PLOF: Independent ?  ?PATIENT GOALS be able to vacuum and mop without pain ?  ?  ?OBJECTIVE:  ?  ?DIAGNOSTIC FINDINGS:   ?R KNEE XRAY: ?IMPRESSION: ?No acute fracture or dislocation ?  ?  ?  ?SCREENING FOR RED FLAGS: ?Bowel or bladder incontinence: No ?Spinal tumors: No ?Cauda equina syndrome: No ?Compression fracture: No ?Abdominal aneurysm: No ?  ?COGNITION: ?          Overall cognitive status: Within functional limits for tasks assessed               ?           ?SENSATION: ?WFL ?  ?MUSCLE LENGTH: ?Hamstrings: Right 30 deg; Left 30 deg ?  ?  ?POSTURE:  ?Limited lumbo pelvic motion, forward head and kyphotic posture ?  ?PALPATION: ?hypersensitivity over L4/5, tenderness to palpation along right  lumbar paraspinals and glutes ?  ?LUMBAR ROM:  ?  ?Active  A/PROM  ?02/18/2022  ?Flexion 75% limited (limited motion due to pain)  ?Extension 75% limited  ?Right lateral flexion 50% limited  ?Left lateral flexion 50% limited  ?Right rotation    ?Left rotation    ? (Blank rows = not tested) ?                      No lumbar/pelvic motion noted with any motions ?  ?           LE Measurements ?      ?Lower Extremity  Right ?02/18/2022 Left ?02/18/2022  ?  A/PROM MMT A/PROM MMT  ?Hip Flexion 105* 4 110 4+  ?Hip Extension   4   4  ?Hip Abduction          ?Hip Adduction          ?Hip Internal rotation 15*   15    ?Hip External rotation 50   50    ?Knee Flexion   4   4  ?Knee Extension   4+   4+  ?Ankle Dorsiflexion   5   5  ?Ankle Plantarflexion          ?Ankle Inversion          ?Ankle Eversion          ? (Blank rows = not tested) ?           * pain ?  ?  ?LUMBAR SPECIAL TESTS:  ?Straight leg raise test: Negative, Slump test: Negative, and FABER test: Negative, Ely's negative but limited motion noted bilaterally with L worse then R, repeated prone extensions - no change in symptoms ?  ?  ?  ?  ?  ?TODAY'S TREATMENT  ?02/21/2022 ?Therapeutic Exercise: ?   Aerobic: ?Supine: bridges 3x10 5" holds, SKC  20" holds 5 bilateral, LTR 2 minutes total ?Prone: prone press ups 2 minutes, hamstring curls 5" holds 3 minutes total, prone heel press 5" holds 2 minutes total ?   Seated: ?   Standing: glute isometrics 5" holds with knee not locked 2 minutes total ?Neuromuscular Re-education: hip hinge - forward reaching over table 12 minutes total - tactile and verbal cues - with and without dowel ?Manual Therapy: ?Therapeutic Activity: ?Self Care: ?Trigger Point Dry Needling:  ?Modalities:  ?  ?  ?  ?PATIENT EDUCATION:  ?Education details: on HEP, on rationale for exercises and hip hinge motion ?Person educated: Patient ?Education method: Explanation, Demonstration, and Handouts ?Education comprehension: verbalized understanding ?  ?  ?HOME EXERCISE PROGRAM: ?[id number] ?  ?ASSESSMENT: ?  ?CLINICAL IMPRESSION: ?02/21/2022 ?Session focused on lumbar mobility and hip strengthening which was tolerated well. Added hip hinge motion and educated patient on rationale for this and difference between passive and active postures/positioning. Fatigue noted end of session and minor soreness. Tactile and verbal cues throughout session. Will continue with current POC as  tolerated.  ? ?Eval: Patient is a 46 y.o. female who was seen today for physical therapy evaluation and treatment for low back pain and right knee pain. Patient presents today with limitations in ROM and strength

## 2022-02-26 ENCOUNTER — Encounter: Payer: Self-pay | Admitting: Physical Therapy

## 2022-02-26 ENCOUNTER — Ambulatory Visit (INDEPENDENT_AMBULATORY_CARE_PROVIDER_SITE_OTHER): Payer: Medicare Other | Admitting: Physical Therapy

## 2022-02-26 DIAGNOSIS — M5459 Other low back pain: Secondary | ICD-10-CM | POA: Diagnosis not present

## 2022-02-26 DIAGNOSIS — R262 Difficulty in walking, not elsewhere classified: Secondary | ICD-10-CM | POA: Diagnosis not present

## 2022-02-26 DIAGNOSIS — G8929 Other chronic pain: Secondary | ICD-10-CM

## 2022-02-26 DIAGNOSIS — M25561 Pain in right knee: Secondary | ICD-10-CM

## 2022-02-26 DIAGNOSIS — M6281 Muscle weakness (generalized): Secondary | ICD-10-CM

## 2022-02-26 NOTE — Therapy (Signed)
?OUTPATIENT PHYSICAL THERAPY TREATMENT NOTE ? ? ?Patient Name: Crystal Bean ?MRN: [id number] ?DOB:1976/06/22, 46 y.o., female ?Today's Date: 02/26/2022 ? ?PCP: Inda Coke, PA ?REFERRING PROVIDER: Inda Coke, PA ? ?END OF SESSION:  ? PT End of Session - 02/26/22 0932   ? ? Visit Number 3   ? Number of Visits 12   ? Date for PT Re-Evaluation 04/01/22   ? Authorization Type medicare   ? Progress Note Due on Visit 10   ? PT Start Time [phone]   ? PT Stop Time 1012   ? PT Time Calculation (min) 38 min   ? Activity Tolerance Patient tolerated treatment well   ? ?  ?  ? ?  ? ? ?Past Medical History:  ?Diagnosis Date  ? ADHD (attention deficit hyperactivity disorder)   ? Anxiety   ? B12 deficiency   ? Back pain   ? Bipolar 1 disorder ([name])   ? Bulging lumbar disc   ? C. difficile diarrhea 04/24/2016  ? treated with metronidazole  ? Constipation   ? Depression   ? GERD (gastroesophageal reflux disease)   ? History of chicken pox   ? History of shingles 2013  ? Hypertension   ? IBS (irritable bowel syndrome)   ? Joint pain   ? Major depressive disorder   ? Migraines   ? Obesity   ? Obsessive-compulsive disorder   ? Osteoarthritis   ? Prediabetes   ? PTSD (post-traumatic stress disorder)   ? Sleep apnea   ? SOB (shortness of breath)   ? ?Past Surgical History:  ?Procedure Laterality Date  ? BLADDER SURGERY  1981  ? Urethera stretched  ? caps on teeth  1980  ? Caps on all Teeth  ? CESAREAN SECTION  2003  ? CORONARY STENT INTERVENTION N/A 12/28/2021  ? Procedure: CORONARY STENT INTERVENTION;  Surgeon: Martinique, Peter M, MD;  Location: [facility];  Service: Cardiovascular;  Laterality: N/A;  ? LEFT HEART CATH AND CORONARY ANGIOGRAPHY N/A 12/28/2021  ? Procedure: LEFT HEART CATH AND CORONARY ANGIOGRAPHY;  Surgeon: Martinique, Peter M, MD;  Location: [facility];  Service: Cardiovascular;  Laterality: N/A;  ? TONSILLECTOMY    ? WISDOM TOOTH EXTRACTION    ? ?Patient Active Problem List  ? Diagnosis Date Noted  ?  NSTEMI (non-ST elevated myocardial infarction) ([place]) 12/27/2021  ? Sore throat 11/08/2021  ? Mixed hyperlipidemia 06/05/2021  ? Vitamin D deficiency 06/05/2021  ? B12 deficiency 06/05/2021  ? Pre-diabetes 02/12/2021  ? Erythema nodosum 12/26/2020  ? Excessive daytime sleepiness 10/30/2020  ? Snoring 10/30/2020  ? Nocturia more than twice per night 10/30/2020  ? ADHD, adult residual type 10/30/2020  ? 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 Obsessive compulsive disorder 09/08/2018  ? Bipolar 1 disorder ([place]) 08/26/2018  ? Obesity 04/08/2018  ? Essential hypertension 04/08/2018  ? PTSD (post-traumatic stress disorder)   ? Osteoarthritis   ? Migraines   ? GERD (gastroesophageal reflux disease)   ? Major depressive disorder, recurrent episode with mixed features ([name])   ? Bulging lumbar disc   ? ?  ?PCP: Inda Coke, PA ?  ?REFERRING PROVIDER: [name], MD ?  ?REFERRING DIAG:  ?M25.561 (ICD-10-CM) - Acute pain of right knee ?M54.41,G89.29 (ICD-10-CM) - Chronic bilateral low back pain with right-sided sciatica ?  ?THERAPY DIAG:  ?Difficulty in walking, not elsewhere classified ?  ?Muscle weakness (generalized) ?  ?Other low back pain ?  ?Chronic pain of right knee ?  ?ONSET  DATE: back pain has been years ?  ?SUBJECTIVE:                                                                                                                                                                                          ?  ?SUBJECTIVE STATEMENT: ?02/26/2022 ?States that she has been having pain since Friday and in her one spot in her back radiating down both legs. States she hasn't done much. States that she did not have left leg pain but did have right leg pain.  ? ? ? ?Eval:States that she has had back pain for years and has done PT 2x. States that she felt good after PT. States that she has no space between L4/5 and when it gets bad she has pain into her right leg. States when it is really bad it shoots down both sides. Standing is the  worse. States she can't sweep the floor. States she usually pushes through the pain. States that biking hurts and she likes to swim and she walks in the pool. States that she has a knot on her right and if you hit it it hurts really bad. States stairs are difficult and she cannot start with there right leg as it is painful. ?  ?  ?PERTINENT HISTORY:  ?hx of MI- radial artery cardiac catherization in 12/28/21 ?Bipolar, depression  ?  ?PAIN:  ?Are you having pain? Yes: NPRS scale: 1/10 ?Pain location: across low back and then down back of the leg  ?Pain description: throbbing, sharp ?Aggravating factors: standing > 15 minutes, prolonged sitting, bending ?Relieving factors: sitting, rest , swimming ?  ?  ?PRECAUTIONS: None ?  ?WEIGHT BEARING RESTRICTIONS No ?  ?FALLS:  ?Has patient fallen in last 6 months? No ?  ?  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  ?PLOF: Independent ?  ?PATIENT GOALS be able to vacuum and mop without pain ?  ?  ?OBJECTIVE:  ?  ?DIAGNOSTIC FINDINGS:   ?R KNEE XRAY: ?IMPRESSION: ?No acute fracture or dislocation ?  ?  ?  ?SCREENING FOR RED FLAGS: ?Bowel or bladder incontinence: No ?Spinal tumors: No ?Cauda equina syndrome: No ?Compression fracture: No ?Abdominal aneurysm: No ?  ?COGNITION: ?          Overall cognitive status: Within functional limits for tasks assessed               ?           ?SENSATION: ?WFL ?  ?MUSCLE LENGTH: ?Hamstrings: Right 30 deg; Left 30 deg ?  ?  ?POSTURE:  ?Limited lumbo pelvic motion, forward head and  kyphotic posture ?  ?PALPATION: ?hypersensitivity over L4/5, tenderness to palpation along right lumbar paraspinals and glutes ?  ?LUMBAR ROM:  ?  ?Active  A/PROM  ?02/18/2022  ?Flexion 75% limited (limited motion due to pain)  ?Extension 75% limited  ?Right lateral flexion 50% limited  ?Left lateral flexion 50% limited  ?Right rotation    ?Left rotation    ? (Blank rows = not tested) ?                      No lumbar/pelvic motion noted with any  motions ?  ?           LE Measurements ?      ?Lower Extremity Right ?02/18/2022 Left ?02/18/2022  ?  A/PROM MMT A/PROM MMT  ?Hip Flexion 105* 4 110 4+  ?Hip Extension   4   4  ?Hip Abduction          ?Hip Adduction          ?Hip Internal rotation 15*   15    ?Hip External rotation 50   50    ?Knee Flexion   4   4  ?Knee Extension   4+   4+  ?Ankle Dorsiflexion   5   5  ?Ankle Plantarflexion          ?Ankle Inversion          ?Ankle Eversion          ? (Blank rows = not tested) ?           * pain ?  ?  ?LUMBAR SPECIAL TESTS:  ?Straight leg raise test: Negative, Slump test: Negative, and FABER test: Negative, Ely's negative but limited motion noted bilaterally with L worse then R, repeated prone extensions - no change in symptoms ?  ?  ?  ?  ?  ?TODAY'S TREATMENT  ?02/26/2022 ?Therapeutic Exercise: ?   Aerobic: ?Supine: hamstring iso on ball 2 minutes total, LTR on ball 2 minutes total, DKC on ball 2 minutes, deadbugs 3x5 5" holds legs supported - cues to breath, piriformis stretch x3 30" holds Bilateral in hook lying, sciatic nerve glide 1 minute each leg ?   Seated: ?   Standing: glute isometrics 5" holds with knee not locked 2 minutes total, mini ball wall squats x15  ?Manual Therapy: traction lumbar on ball 6 minutes total - tolerated well  ?Therapeutic Activity: ?  ? ? ?Previous interventions:  ?Neuromuscular Re-education: hip hinge - forward reaching over table 12 minutes total - tactile and verbal cues - with and without dowel ?Supine:bridges 3x10 5" holds, SKC  20" holds 5 bilateral, LTR 2 minutes total ?Prone: prone press ups 2 minutes, hamstring curls 5" holds 3 minutes total, prone heel press 5" holds 2 minutes total ?  ?  ?PATIENT EDUCATION:  ?Education details: on rationale for traction, goal of exercises and difference between compression and decompression, tightness in fascia ?Person educated: Patient ?Education method: Explanation, Demonstration, and Handouts ?Education comprehension: verbalized  understanding ?  ?  ?HOME EXERCISE PROGRAM: ?[id number] ?  ?ASSESSMENT: ?  ?CLINICAL IMPRESSION: ?02/26/2022 ?Session focused on decompressive exercises which were tolerated well. Educated patient in decompression vs compressio

## 2022-02-28 ENCOUNTER — Encounter: Payer: Self-pay | Admitting: Physical Therapy

## 2022-02-28 ENCOUNTER — Ambulatory Visit (INDEPENDENT_AMBULATORY_CARE_PROVIDER_SITE_OTHER): Payer: Medicare Other | Admitting: Physical Therapy

## 2022-02-28 DIAGNOSIS — M6281 Muscle weakness (generalized): Secondary | ICD-10-CM | POA: Diagnosis not present

## 2022-02-28 DIAGNOSIS — M5459 Other low back pain: Secondary | ICD-10-CM

## 2022-02-28 DIAGNOSIS — R262 Difficulty in walking, not elsewhere classified: Secondary | ICD-10-CM

## 2022-02-28 DIAGNOSIS — M25561 Pain in right knee: Secondary | ICD-10-CM

## 2022-02-28 DIAGNOSIS — G8929 Other chronic pain: Secondary | ICD-10-CM

## 2022-02-28 NOTE — Therapy (Signed)
?OUTPATIENT PHYSICAL THERAPY TREATMENT NOTE ? ? ?Patient Name: Crystal Bean ?MRN: [id number] ?DOB:1976/01/25, 46 y.o., female ?Today's Date: 02/28/2022 ? ?PCP: Inda Coke, PA ?REFERRING PROVIDER: Inda Coke, PA ? ?END OF SESSION:  ? PT End of Session - 02/28/22 0930   ? ? Visit Number 4   ? Number of Visits 12   ? Date for PT Re-Evaluation 04/01/22   ? Authorization Type medicare   ? Progress Note Due on Visit 10   ? PT Start Time 0930   ? PT Stop Time 1009   ? PT Time Calculation (min) 39 min   ? Activity Tolerance Patient tolerated treatment well   ? ?  ?  ? ?  ? ? ?Past Medical History:  ?Diagnosis Date  ? ADHD (attention deficit hyperactivity disorder)   ? Anxiety   ? B12 deficiency   ? Back pain   ? Bipolar 1 disorder ([place])   ? Bulging lumbar disc   ? C. difficile diarrhea 04/24/2016  ? treated with metronidazole  ? Constipation   ? Depression   ? GERD (gastroesophageal reflux disease)   ? History of chicken pox   ? History of shingles 2013  ? Hypertension   ? IBS (irritable bowel syndrome)   ? Joint pain   ? Major depressive disorder   ? Migraines   ? Obesity   ? Obsessive-compulsive disorder   ? Osteoarthritis   ? Prediabetes   ? PTSD (post-traumatic stress disorder)   ? Sleep apnea   ? SOB (shortness of breath)   ? ?Past Surgical History:  ?Procedure Laterality Date  ? BLADDER SURGERY  1981  ? Urethera stretched  ? caps on teeth  1980  ? Caps on all Teeth  ? CESAREAN SECTION  2003  ? CORONARY STENT INTERVENTION N/A 12/28/2021  ? Procedure: CORONARY STENT INTERVENTION;  Surgeon: Martinique, Peter M, MD;  Location: [facility];  Service: Cardiovascular;  Laterality: N/A;  ? LEFT HEART CATH AND CORONARY ANGIOGRAPHY N/A 12/28/2021  ? Procedure: LEFT HEART CATH AND CORONARY ANGIOGRAPHY;  Surgeon: Martinique, Peter M, MD;  Location: [facility];  Service: Cardiovascular;  Laterality: N/A;  ? TONSILLECTOMY    ? WISDOM TOOTH EXTRACTION    ? ?Patient Active Problem List  ? Diagnosis Date Noted  ?  NSTEMI (non-ST elevated myocardial infarction) ([place]) 12/27/2021  ? Sore throat 11/08/2021  ? Mixed hyperlipidemia 06/05/2021  ? Vitamin D deficiency 06/05/2021  ? B12 deficiency 06/05/2021  ? Pre-diabetes 02/12/2021  ? Erythema nodosum 12/26/2020  ? Excessive daytime sleepiness 10/30/2020  ? Snoring 10/30/2020  ? Nocturia more than twice per night 10/30/2020  ? ADHD, adult residual type 10/30/2020  ? 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 Obsessive compulsive disorder 09/08/2018  ? Bipolar 1 disorder ([place]) 08/26/2018  ? Obesity 04/08/2018  ? Essential hypertension 04/08/2018  ? PTSD (post-traumatic stress disorder)   ? Osteoarthritis   ? Migraines   ? GERD (gastroesophageal reflux disease)   ? Major depressive disorder, recurrent episode with mixed features ([place])   ? Bulging lumbar disc   ? ?  ?PCP: Inda Coke, PA ?  ?REFERRING PROVIDER: [name], MD ?  ?REFERRING DIAG:  ?M25.561 (ICD-10-CM) - Acute pain of right knee ?M54.41,G89.29 (ICD-10-CM) - Chronic bilateral low back pain with right-sided sciatica ?  ?THERAPY DIAG:  ?Difficulty in walking, not elsewhere classified ?  ?Muscle weakness (generalized) ?  ?Other low back pain ?  ?Chronic pain of right knee ?  ?ONSET  DATE: back pain has been years ?  ?SUBJECTIVE:                                                                                                                                                                                          ?  ?SUBJECTIVE STATEMENT: ?02/28/2022 ?States that she is more sore today after working out and she is still in pain. States she was standing a lot yesterday.  ? ? ? ?Eval:States that she has had back pain for years and has done PT 2x. States that she felt good after PT. States that she has no space between L4/5 and when it gets bad she has pain into her right leg. States when it is really bad it shoots down both sides. Standing is the worse. States she can't sweep the floor. States she usually pushes through the pain. States  that biking hurts and she likes to swim and she walks in the pool. States that she has a knot on her right and if you hit it it hurts really bad. States stairs are difficult and she cannot start with there right leg as it is painful. ?  ?  ?PERTINENT HISTORY:  ?hx of MI- radial artery cardiac catherization in 12/28/21 ?Bipolar, depression  ?  ?PAIN:  ?Are you having pain? Yes: NPRS scale: 1/10 ?Pain location: across low back and then down back of the leg  ?Pain description: sharp and intense, localized on right side  ?Aggravating factors: standing > 15 minutes, prolonged sitting, bending ?Relieving factors: sitting, rest , swimming ?  ?  ?PRECAUTIONS: None ?  ?WEIGHT BEARING RESTRICTIONS No ?  ?FALLS:  ?Has patient fallen in last 6 months? No ?  ?  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  ?PLOF: Independent ?  ?PATIENT GOALS be able to vacuum and mop without pain ?  ?  ?OBJECTIVE:  ?  ?DIAGNOSTIC FINDINGS:   ?R KNEE XRAY: ?IMPRESSION: ?No acute fracture or dislocation ?  ?  ?  ?SCREENING FOR RED FLAGS: ?Bowel or bladder incontinence: No ?Spinal tumors: No ?Cauda equina syndrome: No ?Compression fracture: No ?Abdominal aneurysm: No ?  ?COGNITION: ?          Overall cognitive status: Within functional limits for tasks assessed               ?           ?SENSATION: ?WFL ?  ?MUSCLE LENGTH: ?Hamstrings: Right 30 deg; Left 30 deg ?  ?  ?POSTURE:  ?Limited lumbo pelvic motion, forward head and kyphotic posture ?  ?PALPATION: ?hypersensitivity over L4/5, tenderness to palpation along  right lumbar paraspinals and glutes ?  ?LUMBAR ROM:  ?  ?Active  A/PROM  ?02/18/2022  ?Flexion 75% limited (limited motion due to pain)  ?Extension 75% limited  ?Right lateral flexion 50% limited  ?Left lateral flexion 50% limited  ?Right rotation    ?Left rotation    ? (Blank rows = not tested) ?                      No lumbar/pelvic motion noted with any motions ?  ?           LE Measurements ?      ?Lower Extremity  Right ?02/18/2022 Left ?02/18/2022  ?  A/PROM MMT A/PROM MMT  ?Hip Flexion 105* 4 110 4+  ?Hip Extension   4   4  ?Hip Abduction          ?Hip Adduction          ?Hip Internal rotation 15*   15    ?Hip External rotation 50   50    ?Knee Flexion   4   4  ?Knee Extension   4+   4+  ?Ankle Dorsiflexion   5   5  ?Ankle Plantarflexion          ?Ankle Inversion          ?Ankle Eversion          ? (Blank rows = not tested) ?           * pain ?  ?  ?LUMBAR SPECIAL TESTS:  ?Straight leg raise test: Negative, Slump test: Negative, and FABER test: Negative, Ely's negative but limited motion noted bilaterally with L worse then R, repeated prone extensions - no change in symptoms ?  ?  ?  ?  ?  ?TODAY'S TREATMENT  ?02/28/2022 ?Therapeutic Exercise: ?   Aerobic: ?Supine: hamstring iso on ball 2 minutes total, LTR on ball 2 minutes total, DKC on ball 2 minutes, deadbugs 3x5 5" holds legs supported - cues to breath, bridges 3x10 child's pose 2 minutes total straight, then L /R one minute each ?   Seated: lumbar traction on chair x10 10" holds - not felt in lumbar spine.  ?   Standing: lumbar stretch modified child's pose with strap - difficult for patient to perform 2 minutes total, lumbar side bending at wall 1 minute total ?Manual Therapy:  ?Therapeutic Activity: ? ? ? ?Previous interventions:  ?Neuromuscular Re-education: hip hinge - forward reaching over table 12 minutes total - tactile and verbal cues - with and without dowel ?Supine:bridges 3x10 5" holds, SKC  20" holds 5 bilateral, LTR 2 minutes total ?Prone: prone press ups 2 minutes, hamstring curls 5" holds 3 minutes total, prone heel press 5" holds 2 minutes total ?  ?  ?PATIENT EDUCATION:  ?Education details: on HEP ?Person educated: Patient ?Education method: Explanation, Demonstration, and Handouts ?Education comprehension: verbalized understanding ?  ?  ?HOME EXERCISE PROGRAM: ?[id number] ?  ?ASSESSMENT: ?  ?CLINICAL IMPRESSION: ?02/28/2022 ? Continued to progress exercises.  Trailed seated traction and standing traction but difficult for patient to relax into these positions. Added child's pose which was tolerated well but patient required pillow under butt to keep from knee being mo

## 2022-03-05 ENCOUNTER — Encounter: Payer: Self-pay | Admitting: Physical Therapy

## 2022-03-05 ENCOUNTER — Ambulatory Visit (INDEPENDENT_AMBULATORY_CARE_PROVIDER_SITE_OTHER): Payer: Medicare Other | Admitting: Physical Therapy

## 2022-03-05 DIAGNOSIS — M6281 Muscle weakness (generalized): Secondary | ICD-10-CM

## 2022-03-05 DIAGNOSIS — R262 Difficulty in walking, not elsewhere classified: Secondary | ICD-10-CM

## 2022-03-05 NOTE — Therapy (Signed)
?OUTPATIENT PHYSICAL THERAPY TREATMENT NOTE ? ? ?Patient Name: Crystal Bean ?MRN: [id number] ?DOB:1976/03/05, 46 y.o., female ?Today's Date: 03/05/2022 ? ?PCP: Inda Coke, PA ?REFERRING PROVIDER: Inda Coke, PA ? ?END OF SESSION:  ? PT End of Session - 03/05/22 0849   ? ? Visit Number 5   ? Number of Visits 12   ? Date for PT Re-Evaluation 04/01/22   ? Authorization Type medicare   ? Progress Note Due on Visit 10   ? PT Start Time [phone]   ? PT Stop Time [phone]   ? PT Time Calculation (min) 38 min   ? Activity Tolerance Patient tolerated treatment well   ? ?  ?  ? ?  ? ? ?Past Medical History:  ?Diagnosis Date  ? ADHD (attention deficit hyperactivity disorder)   ? Anxiety   ? B12 deficiency   ? Back pain   ? Bipolar 1 disorder ([place])   ? Bulging lumbar disc   ? C. difficile diarrhea 04/24/2016  ? treated with metronidazole  ? Constipation   ? Depression   ? GERD (gastroesophageal reflux disease)   ? History of chicken pox   ? History of shingles 2013  ? Hypertension   ? IBS (irritable bowel syndrome)   ? Joint pain   ? Major depressive disorder   ? Migraines   ? Obesity   ? Obsessive-compulsive disorder   ? Osteoarthritis   ? Prediabetes   ? PTSD (post-traumatic stress disorder)   ? Sleep apnea   ? SOB (shortness of breath)   ? ?Past Surgical History:  ?Procedure Laterality Date  ? BLADDER SURGERY  1981  ? Urethera stretched  ? caps on teeth  1980  ? Caps on all Teeth  ? CESAREAN SECTION  2003  ? CORONARY STENT INTERVENTION N/A 12/28/2021  ? Procedure: CORONARY STENT INTERVENTION;  Surgeon: Martinique, Peter M, MD;  Location: [facility];  Service: Cardiovascular;  Laterality: N/A;  ? LEFT HEART CATH AND CORONARY ANGIOGRAPHY N/A 12/28/2021  ? Procedure: LEFT HEART CATH AND CORONARY ANGIOGRAPHY;  Surgeon: Martinique, Peter M, MD;  Location: [facility];  Service: Cardiovascular;  Laterality: N/A;  ? TONSILLECTOMY    ? WISDOM TOOTH EXTRACTION    ? ?Patient Active Problem List  ? Diagnosis Date Noted  ?  NSTEMI (non-ST elevated myocardial infarction) ([place]) 12/27/2021  ? Sore throat 11/08/2021  ? Mixed hyperlipidemia 06/05/2021  ? Vitamin D deficiency 06/05/2021  ? B12 deficiency 06/05/2021  ? Pre-diabetes 02/12/2021  ? Erythema nodosum 12/26/2020  ? Excessive daytime sleepiness 10/30/2020  ? Snoring 10/30/2020  ? Nocturia more than twice per night 10/30/2020  ? ADHD, adult residual type 10/30/2020  ? 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 Obsessive compulsive disorder 09/08/2018  ? Bipolar 1 disorder ([place]) 08/26/2018  ? Obesity 04/08/2018  ? Essential hypertension 04/08/2018  ? PTSD (post-traumatic stress disorder)   ? Osteoarthritis   ? Migraines   ? GERD (gastroesophageal reflux disease)   ? Major depressive disorder, recurrent episode with mixed features ([place])   ? Bulging lumbar disc   ? ?  ?PCP: Inda Coke, PA ?  ?REFERRING PROVIDER: [name], MD ?  ?REFERRING DIAG:  ?M25.561 (ICD-10-CM) - Acute pain of right knee ?M54.41,G89.29 (ICD-10-CM) - Chronic bilateral low back pain with right-sided sciatica ?  ?THERAPY DIAG:  ?Difficulty in walking, not elsewhere classified ?  ?Muscle weakness (generalized) ?  ?Other low back pain ?  ?Chronic pain of right knee ?  ?ONSET  DATE: back pain has been years ?  ?SUBJECTIVE:                                                                                                                                                                                          ?  ?SUBJECTIVE STATEMENT: ?03/05/2022 ?States that she is feeling good no current pains.  ? ? ?Eval:States that she has had back pain for years and has done PT 2x. States that she felt good after PT. States that she has no space between L4/5 and when it gets bad she has pain into her right leg. States when it is really bad it shoots down both sides. Standing is the worse. States she can't sweep the floor. States she usually pushes through the pain. States that biking hurts and she likes to swim and she walks in the pool. States  that she has a knot on her right and if you hit it it hurts really bad. States stairs are difficult and she cannot start with there right leg as it is painful. ?  ?  ?PERTINENT HISTORY:  ?hx of MI- radial artery cardiac catherization in 12/28/21 ?Bipolar, depression  ?  ?PAIN:  ?Are you having pain? no: NPRS scale: 0/10 ?Pain location: across low back and then down back of the leg  ?Pain description: sharp and intense, localized on right side  ?Aggravating factors: standing > 15 minutes, prolonged sitting, bending ?Relieving factors: sitting, rest , swimming ?  ?  ?PRECAUTIONS: None ?  ?WEIGHT BEARING RESTRICTIONS No ?  ?FALLS:  ?Has patient fallen in last 6 months? No ?  ?  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  ?PLOF: Independent ?  ?PATIENT GOALS be able to vacuum and mop without pain ?  ?  ?OBJECTIVE:  ?  ?DIAGNOSTIC FINDINGS:   ?R KNEE XRAY: ?IMPRESSION: ?No acute fracture or dislocation ?  ?  ?  ?SCREENING FOR RED FLAGS: ?Bowel or bladder incontinence: No ?Spinal tumors: No ?Cauda equina syndrome: No ?Compression fracture: No ?Abdominal aneurysm: No ?  ?COGNITION: ?          Overall cognitive status: Within functional limits for tasks assessed               ?           ?SENSATION: ?WFL ?  ?MUSCLE LENGTH: ?Hamstrings: Right 30 deg; Left 30 deg ?  ?  ?POSTURE:  ?Limited lumbo pelvic motion, forward head and kyphotic posture ?  ?PALPATION: ?hypersensitivity over L4/5, tenderness to palpation along right lumbar paraspinals and glutes ?  ?LUMBAR ROM:  ?  ?Active  A/PROM  ?  02/18/2022  ?Flexion 75% limited (limited motion due to pain)  ?Extension 75% limited  ?Right lateral flexion 50% limited  ?Left lateral flexion 50% limited  ?Right rotation    ?Left rotation    ? (Blank rows = not tested) ?                      No lumbar/pelvic motion noted with any motions ?  ?           LE Measurements ?      ?Lower Extremity Right ?02/18/2022 Left ?02/18/2022  ?  A/PROM MMT A/PROM MMT  ?Hip Flexion 105* 4 110  4+  ?Hip Extension   4   4  ?Hip Abduction          ?Hip Adduction          ?Hip Internal rotation 15*   15    ?Hip External rotation 50   50    ?Knee Flexion   4   4  ?Knee Extension   4+   4+  ?Ankle Dorsiflexion   5   5  ?Ankle Plantarflexion          ?Ankle Inversion          ?Ankle Eversion          ? (Blank rows = not tested) ?           * pain ?  ?  ?LUMBAR SPECIAL TESTS:  ?Straight leg raise test: Negative, Slump test: Negative, and FABER test: Negative, Ely's negative but limited motion noted bilaterally with L worse then R, repeated prone extensions - no change in symptoms ?  ?  ?  ?  ?  ?TODAY'S TREATMENT  ?03/05/2022 ?Therapeutic Exercise: ?   Prone: hip extension 3x10, bilateral ?Quad: plank 5" holds over one minute x2 reps ?Supine: hamstring iso on ball 2 minutes total, LTR on ball 2 minutes total, DKC on ball 2 minutes, piriformis stretch on ball 2 minutes each leg,  bridges with blue band around thighs x3 1 minutes, ?   Standing: pallof press blue band- 3x5 bilateral, shoulder extension with blue band in hip hinge position 3x10 ?Manual Therapy:  ?Therapeutic Activity: ? ? ? ?Previous interventions:  ?Neuromuscular Re-education: hip hinge - forward reaching over table 12 minutes total - tactile and verbal cues - with and without dowel ?Supine:bridges 3x10 5" holds, SKC  20" holds 5 bilateral, LTR 2 minutes total ?Prone: prone press ups 2 minutes, hamstring curls 5" holds 3 minutes total, prone heel press 5" holds 2 minutes total ?  ?  ?PATIENT EDUCATION:  ?Education details: on HEP ?Person educated: Patient ?Education method: Explanation, Demonstration, and Handouts ?Education comprehension: verbalized understanding ?  ?  ?HOME EXERCISE PROGRAM: ?[id number] ?  ?ASSESSMENT: ?  ?CLINICAL IMPRESSION: ?03/05/2022 ? Continued with lumbar stretches to start and this was tolerated well. Added quad planks and progress pallof press with blue band. Both were tolerated well. Fatigue in legs and core noted but no  pain. Verbal cues throughout session for form. Will continue with current POC as tolerated.  ? ?Eval: Patient is a 46 y.o. female who was seen today for physical therapy evaluation and treatment for low back

## 2022-03-06 ENCOUNTER — Other Ambulatory Visit: Payer: Self-pay | Admitting: Physician Assistant

## 2022-03-06 ENCOUNTER — Telehealth: Payer: Self-pay | Admitting: Physician Assistant

## 2022-03-06 MED ORDER — AMPHETAMINE-DEXTROAMPHET ER 10 MG PO CP24: 10.0000 mg | ORAL_CAPSULE | Freq: Two times a day (BID) | ORAL | 0 refills | Status: DC

## 2022-03-06 NOTE — Telephone Encounter (Signed)
Reviewed recent note from cardiology, known Adderall use to them.  Sent 1 prescription. ?

## 2022-03-06 NOTE — Telephone Encounter (Signed)
Patient called in for refill on Adderall '10mg'$ . Ph: [phone]. [facility] ?

## 2022-03-07 ENCOUNTER — Encounter: Payer: Self-pay | Admitting: Physical Therapy

## 2022-03-07 ENCOUNTER — Ambulatory Visit (INDEPENDENT_AMBULATORY_CARE_PROVIDER_SITE_OTHER): Payer: Medicare Other | Admitting: Physical Therapy

## 2022-03-07 DIAGNOSIS — M6281 Muscle weakness (generalized): Secondary | ICD-10-CM

## 2022-03-07 DIAGNOSIS — R262 Difficulty in walking, not elsewhere classified: Secondary | ICD-10-CM | POA: Diagnosis not present

## 2022-03-07 DIAGNOSIS — M25561 Pain in right knee: Secondary | ICD-10-CM | POA: Diagnosis not present

## 2022-03-07 DIAGNOSIS — G8929 Other chronic pain: Secondary | ICD-10-CM | POA: Diagnosis not present

## 2022-03-07 NOTE — Therapy (Signed)
?OUTPATIENT PHYSICAL THERAPY TREATMENT NOTE  ? ? ?Patient Name: Crystal Bean ?MRN: [id number] ?DOB:01/20/1976, 46 y.o., female ?Today's Date: 03/07/2022 ? ?PCP: Inda Coke, PA ?REFERRING PROVIDER: Inda Coke, PA ? ?END OF SESSION:  ? PT End of Session - 03/07/22 1216   ? ? Visit Number 6   ? Number of Visits 12   ? Date for PT Re-Evaluation 04/01/22   ? Authorization Type medicare   ? Progress Note Due on Visit 10   ? PT Start Time 1217   ? PT Stop Time 0037   ? PT Time Calculation (min) 38 min   ? Activity Tolerance Patient tolerated treatment well   ? ?  ?  ? ?  ? ? ?Past Medical History:  ?Diagnosis Date  ? ADHD (attention deficit hyperactivity disorder)   ? Anxiety   ? B12 deficiency   ? Back pain   ? Bipolar 1 disorder ([place])   ? Bulging lumbar disc   ? C. difficile diarrhea 04/24/2016  ? treated with metronidazole  ? Constipation   ? Depression   ? GERD (gastroesophageal reflux disease)   ? History of chicken pox   ? History of shingles 2013  ? Hypertension   ? IBS (irritable bowel syndrome)   ? Joint pain   ? Major depressive disorder   ? Migraines   ? Obesity   ? Obsessive-compulsive disorder   ? Osteoarthritis   ? Prediabetes   ? PTSD (post-traumatic stress disorder)   ? Sleep apnea   ? SOB (shortness of breath)   ? ?Past Surgical History:  ?Procedure Laterality Date  ? BLADDER SURGERY  1981  ? Urethera stretched  ? caps on teeth  1980  ? Caps on all Teeth  ? CESAREAN SECTION  2003  ? CORONARY STENT INTERVENTION N/A 12/28/2021  ? Procedure: CORONARY STENT INTERVENTION;  Surgeon: Martinique, Peter M, MD;  Location: [facility];  Service: Cardiovascular;  Laterality: N/A;  ? LEFT HEART CATH AND CORONARY ANGIOGRAPHY N/A 12/28/2021  ? Procedure: LEFT HEART CATH AND CORONARY ANGIOGRAPHY;  Surgeon: Martinique, Peter M, MD;  Location: [facility];  Service: Cardiovascular;  Laterality: N/A;  ? TONSILLECTOMY    ? WISDOM TOOTH EXTRACTION    ? ?Patient Active Problem List  ? Diagnosis Date Noted  ?  NSTEMI (non-ST elevated myocardial infarction) ([place]) 12/27/2021  ? Sore throat 11/08/2021  ? Mixed hyperlipidemia 06/05/2021  ? Vitamin D deficiency 06/05/2021  ? B12 deficiency 06/05/2021  ? Pre-diabetes 02/12/2021  ? Erythema nodosum 12/26/2020  ? Excessive daytime sleepiness 10/30/2020  ? Snoring 10/30/2020  ? Nocturia more than twice per night 10/30/2020  ? ADHD, adult residual type 10/30/2020  ? 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 Obsessive compulsive disorder 09/08/2018  ? Bipolar 1 disorder ([place]) 08/26/2018  ? Obesity 04/08/2018  ? Essential hypertension 04/08/2018  ? PTSD (post-traumatic stress disorder)   ? Osteoarthritis   ? Migraines   ? GERD (gastroesophageal reflux disease)   ? Major depressive disorder, recurrent episode with mixed features ([place])   ? Bulging lumbar disc   ? ?  ?PCP: Inda Coke, PA ?  ?REFERRING PROVIDER: [name], MD ?  ?REFERRING DIAG:  ?M25.561 (ICD-10-CM) - Acute pain of right knee ?M54.41,G89.29 (ICD-10-CM) - Chronic bilateral low back pain with right-sided sciatica ?  ?THERAPY DIAG:  ?Difficulty in walking, not elsewhere classified ?  ?Muscle weakness (generalized) ?  ?Other low back pain ?  ?Chronic pain of right knee ?  ?  ONSET DATE: back pain has been years ?  ?SUBJECTIVE:                                                                                                                                                                                          ?  ?SUBJECTIVE STATEMENT: ?03/07/2022 ?States that she is a little sore after last session in the legs.  Reports overall she feels about 20-40% better since the start of PT  ? ? ?Eval:States that she has had back pain for years and has done PT 2x. States that she felt good after PT. States that she has no space between L4/5 and when it gets bad she has pain into her right leg. States when it is really bad it shoots down both sides. Standing is the worse. States she can't sweep the floor. States she usually pushes through the  pain. States that biking hurts and she likes to swim and she walks in the pool. States that she has a knot on her right and if you hit it it hurts really bad. States stairs are difficult and she cannot start with there right leg as it is painful. ?  ?  ?PERTINENT HISTORY:  ?hx of MI- radial artery cardiac catherization in 12/28/21 ?Bipolar, depression  ?  ?PAIN:  ?Are you having pain? no: NPRS scale: 0/10 ?Pain location: across low back and then down back of the leg  ?Pain description: sharp and intense, localized on right side  ?Aggravating factors: standing > 15 minutes, prolonged sitting, bending ?Relieving factors: sitting, rest , swimming ?  ?  ?PRECAUTIONS: None ?  ?WEIGHT BEARING RESTRICTIONS No ?  ?FALLS:  ?Has patient fallen in last 6 months? No ?  ?  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  ?PLOF: Independent ?  ?PATIENT GOALS be able to vacuum and mop without pain ?  ?  ?OBJECTIVE:  ?  ?DIAGNOSTIC FINDINGS:   ?R KNEE XRAY: ?IMPRESSION: ?No acute fracture or dislocation ?  ?  ?  ?SCREENING FOR RED FLAGS: ?Bowel or bladder incontinence: No ?Spinal tumors: No ?Cauda equina syndrome: No ?Compression fracture: No ?Abdominal aneurysm: No ?  ?COGNITION: ?          Overall cognitive status: Within functional limits for tasks assessed               ?           ?SENSATION: ?WFL ?  ?MUSCLE LENGTH: ?Hamstrings: Right 30 deg; Left 30 deg ?  ?  ?POSTURE:  ?Limited lumbo pelvic motion, forward head and kyphotic posture ?  ?PALPATION: ?hypersensitivity over L4/5, tenderness  to palpation along right lumbar paraspinals and glutes ?  ?LUMBAR ROM:  ?  ?Active  A/PROM  ?03/07/2022  ?Flexion 50% limited(slight pain)  ?Extension 75% limited  ?Right lateral flexion 50% limited  ?Left lateral flexion 25% limited  ?Right rotation    ?Left rotation    ? (Blank rows = not tested) ?                      No lumbar/pelvic motion noted with any motions ?  ?           LE Measurements ?      ?Lower Extremity  Right ?03/07/2022 Left ?03/07/2022  ?  A/PROM MMT A/PROM MMT  ?Hip Flexion 110 4+ 110 4+  ?Hip Extension   4   4+  ?Hip Abduction          ?Hip Adduction          ?Hip Internal rotation 15   15    ?Hip External rotation 50   50    ?Knee Flexion   4+   4+  ?Knee Extension   4+   4+  ?Ankle Dorsiflexion   5   5  ?Ankle Plantarflexion          ?Ankle Inversion          ?Ankle Eversion          ? (Blank rows = not tested) ?           * pain ?  ? ?  ?  ?  ?  ?TODAY'S TREATMENT  ?03/07/2022 ?Therapeutic Exercise: ?   Prone: prone on elbows 4 minutes ? Standing: shoulder ExT blue band x15 5" holds,squats at table - elevated surface - ?6 minutes ?  ?Manual Therapy:  ?Neuro:hip hinge seated 10 minutes tactile cues, s ? ? ? ?Previous interventions:  ?Neuromuscular Re-education: hip hinge - forward reaching over table 12 minutes total - tactile and verbal cues - with and without dowel ?Supine:bridges 3x10 5" holds, SKC  20" holds 5 bilateral, LTR 2 minutes total ?Prone: prone press ups 2 minutes, hamstring curls 5" holds 3 minutes total, prone heel press 5" holds 2 minutes total ?  ?  ?PATIENT EDUCATION:  ?Education details: on HEP, on current presentation ?Person educated: Patient ?Education method: Explanation, Demonstration, and Handouts ?Education comprehension: verbalized understanding ?  ?  ?HOME EXERCISE PROGRAM: ?[email or web address] ?  ?ASSESSMENT: ?  ?CLINICAL IMPRESSION: ?03/07/2022 ? Patient is progressing in overall function and mobility ,but still has poor endurance with activities around the home. Walking helps with discomfort but static standing most difficult. Patient improving in hip hinge motion but fatigues quickly. Continued with current POC. ? ?Eval: Patient is a 46 y.o. female who was seen today for physical therapy evaluation and treatment for low back pain and right knee pain. Patient presents today with limitations in ROM and strength and limited lumbopelvic movements that limit patient's overall function. Patient  with positive response from PT previously for treatment of low back pain and would greatly benefit from skilled PT to improve overall function and quality of life.  ?  ?  ?OBJECTIVE IMPAIRMENTS decreased activity toler

## 2022-03-11 ENCOUNTER — Encounter: Payer: Self-pay | Admitting: Physician Assistant

## 2022-03-11 MED ORDER — GABAPENTIN 100 MG PO CAPS: 100.0000 mg | ORAL_CAPSULE | Freq: Three times a day (TID) | ORAL | 1 refills | Status: DC

## 2022-03-11 NOTE — Addendum Note (Signed)
Addended by: Marian Sorrow on: 03/11/2022 12:48 PM ? ? Modules accepted: Orders ? ?

## 2022-03-11 NOTE — Telephone Encounter (Signed)
Pt requesting refills of Gabapentin. ?

## 2022-03-12 ENCOUNTER — Ambulatory Visit (INDEPENDENT_AMBULATORY_CARE_PROVIDER_SITE_OTHER): Payer: Medicare Other | Admitting: Physical Therapy

## 2022-03-12 ENCOUNTER — Encounter: Payer: Self-pay | Admitting: Physical Therapy

## 2022-03-12 DIAGNOSIS — M5459 Other low back pain: Secondary | ICD-10-CM

## 2022-03-12 DIAGNOSIS — M6281 Muscle weakness (generalized): Secondary | ICD-10-CM

## 2022-03-12 DIAGNOSIS — R262 Difficulty in walking, not elsewhere classified: Secondary | ICD-10-CM

## 2022-03-12 DIAGNOSIS — M25561 Pain in right knee: Secondary | ICD-10-CM

## 2022-03-12 DIAGNOSIS — G8929 Other chronic pain: Secondary | ICD-10-CM

## 2022-03-12 NOTE — Therapy (Signed)
?OUTPATIENT PHYSICAL THERAPY TREATMENT NOTE  ? ? ?Patient Name: Crystal Bean ?MRN: [id number] ?DOB:08-Feb-1976, 46 y.o., female ?Today's Date: 03/12/2022 ? ?PCP: Inda Coke, PA ?REFERRING PROVIDER: Inda Coke, PA ? ?END OF SESSION:  ? PT End of Session - 03/12/22 0848   ? ? Visit Number 7   ? Number of Visits 12   ? Date for PT Re-Evaluation 04/01/22   ? Authorization Type medicare   ? Progress Note Due on Visit 10   ? PT Start Time 0848   ? PT Stop Time 9702   ? PT Time Calculation (min) 39 min   ? Activity Tolerance Patient tolerated treatment well   ? ?  ?  ? ?  ? ? ?Past Medical History:  ?Diagnosis Date  ? ADHD (attention deficit hyperactivity disorder)   ? Anxiety   ? B12 deficiency   ? Back pain   ? Bipolar 1 disorder ([place])   ? Bulging lumbar disc   ? C. difficile diarrhea 04/24/2016  ? treated with metronidazole  ? Constipation   ? Depression   ? GERD (gastroesophageal reflux disease)   ? History of chicken pox   ? History of shingles 2013  ? Hypertension   ? IBS (irritable bowel syndrome)   ? Joint pain   ? Major depressive disorder   ? Migraines   ? Obesity   ? Obsessive-compulsive disorder   ? Osteoarthritis   ? Prediabetes   ? PTSD (post-traumatic stress disorder)   ? Sleep apnea   ? SOB (shortness of breath)   ? ?Past Surgical History:  ?Procedure Laterality Date  ? BLADDER SURGERY  1981  ? Urethera stretched  ? caps on teeth  1980  ? Caps on all Teeth  ? CESAREAN SECTION  2003  ? CORONARY STENT INTERVENTION N/A 12/28/2021  ? Procedure: CORONARY STENT INTERVENTION;  Surgeon: Martinique, Peter M, MD;  Location: [facility];  Service: Cardiovascular;  Laterality: N/A;  ? LEFT HEART CATH AND CORONARY ANGIOGRAPHY N/A 12/28/2021  ? Procedure: LEFT HEART CATH AND CORONARY ANGIOGRAPHY;  Surgeon: Martinique, Peter M, MD;  Location: [facility];  Service: Cardiovascular;  Laterality: N/A;  ? TONSILLECTOMY    ? WISDOM TOOTH EXTRACTION    ? ?Patient Active Problem List  ? Diagnosis Date Noted  ?  NSTEMI (non-ST elevated myocardial infarction) ([name]) 12/27/2021  ? Sore throat 11/08/2021  ? Mixed hyperlipidemia 06/05/2021  ? Vitamin D deficiency 06/05/2021  ? B12 deficiency 06/05/2021  ? Pre-diabetes 02/12/2021  ? Erythema nodosum 12/26/2020  ? Excessive daytime sleepiness 10/30/2020  ? Snoring 10/30/2020  ? Nocturia more than twice per night 10/30/2020  ? ADHD, adult residual type 10/30/2020  ? 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 Obsessive compulsive disorder 09/08/2018  ? Bipolar 1 disorder ([name]) 08/26/2018  ? Obesity 04/08/2018  ? Essential hypertension 04/08/2018  ? PTSD (post-traumatic stress disorder)   ? Osteoarthritis   ? Migraines   ? GERD (gastroesophageal reflux disease)   ? Major depressive disorder, recurrent episode with mixed features ([name])   ? Bulging lumbar disc   ? ?  ?PCP: Inda Coke, PA ?  ?REFERRING PROVIDER: [name], MD ?  ?REFERRING DIAG:  ?M25.561 (ICD-10-CM) - Acute pain of right knee ?M54.41,G89.29 (ICD-10-CM) - Chronic bilateral low back pain with right-sided sciatica ?  ?THERAPY DIAG:  ?Difficulty in walking, not elsewhere classified ?  ?Muscle weakness (generalized) ?  ?Other low back pain ?  ?Chronic pain of right knee ?  ?  ONSET DATE: back pain has been years ?  ?SUBJECTIVE:                                                                                                                                                                                          ?  ?SUBJECTIVE STATEMENT: ?03/12/2022 ?States that she is feeling better and has no pain. States that she didn't do her exercises because she has to get on the floor and doesn't like to get on the floor. States that she is heading to the gym after PT today. States that she typically uses the nutstep and a taller nustep. ? ? ?Eval:States that she has had back pain for years and has done PT 2x. States that she felt good after PT. States that she has no space between L4/5 and when it gets bad she has pain into her right leg.  States when it is really bad it shoots down both sides. Standing is the worse. States she can't sweep the floor. States she usually pushes through the pain. States that biking hurts and she likes to swim and she walks in the pool. States that she has a knot on her right and if you hit it it hurts really bad. States stairs are difficult and she cannot start with there right leg as it is painful. ?  ?  ?PERTINENT HISTORY:  ?hx of MI- radial artery cardiac catherization in 12/28/21 ?Bipolar, depression  ?  ?PAIN:  ?Are you having pain? no: NPRS scale: 0/10 ?Pain location: across low back and then down back of the leg  ?Pain description: sharp and intense, localized on right side  ?Aggravating factors: standing > 15 minutes, prolonged sitting, bending ?Relieving factors: sitting, rest , swimming ?  ?  ?PRECAUTIONS: None ?  ?WEIGHT BEARING RESTRICTIONS No ?  ?FALLS:  ?Has patient fallen in last 6 months? No ?  ?  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  ?PLOF: Independent ?  ?PATIENT GOALS be able to vacuum and mop without pain ?  ?  ?OBJECTIVE:  ?  ?DIAGNOSTIC FINDINGS:   ?R KNEE XRAY: ?IMPRESSION: ?No acute fracture or dislocation ?  ?  ?  ?SCREENING FOR RED FLAGS: ?Bowel or bladder incontinence: No ?Spinal tumors: No ?Cauda equina syndrome: No ?Compression fracture: No ?Abdominal aneurysm: No ?  ?COGNITION: ?          Overall cognitive status: Within functional limits for tasks assessed               ?           ?SENSATION: ?WFL ?  ?MUSCLE LENGTH: ?Hamstrings:  Right 30 deg; Left 30 deg ?  ?  ?POSTURE:  ?Limited lumbo pelvic motion, forward head and kyphotic posture ?  ?PALPATION: ?hypersensitivity over L4/5, tenderness to palpation along right lumbar paraspinals and glutes ?  ?LUMBAR ROM:  ?  ?Active  A/PROM  ?03/07/2022  ?Flexion 50% limited(slight pain)  ?Extension 75% limited  ?Right lateral flexion 50% limited  ?Left lateral flexion 25% limited  ?Right rotation    ?Left rotation    ? (Blank rows  = not tested) ?                      No lumbar/pelvic motion noted with any motions ?  ?           LE Measurements ?      ?Lower Extremity Right ?03/07/2022 Left ?03/07/2022  ?  A/PROM MMT A/PROM MMT  ?Hip Flexion 110 4+ 110 4+  ?Hip Extension   4   4+  ?Hip Abduction          ?Hip Adduction          ?Hip Internal rotation 15   15    ?Hip External rotation 50   50    ?Knee Flexion   4+   4+  ?Knee Extension   4+   4+  ?Ankle Dorsiflexion   5   5  ?Ankle Plantarflexion          ?Ankle Inversion          ?Ankle Eversion          ? (Blank rows = not tested) ?           * pain ?  ? ?  ?  ?  ?  ?TODAY'S TREATMENT  ?03/12/2022 ?Therapeutic Exercise: ?   supine with hips/knees 90/90 at wall: leg press alternating 5" holds 2 minutes, mini bridge 2 minutes, windshield wipers 2 minutes, modified alternating deadbugs 2 minutes ? Standing: side bending at wall 2 minutes bilateral, shoulder extension at wall 5" hlds 2 minutes, lumbar rotation 2 minutes, lumbar extension 2 minutes ? ? ? ? ?Previous interventions:  ?Neuromuscular Re-education: hip hinge - forward reaching over table 12 minutes total - tactile and verbal cues - with and without dowel ?Supine:bridges 3x10 5" holds, SKC  20" holds 5 bilateral, LTR 2 minutes total ?Prone: prone press ups 2 minutes, hamstring curls 5" holds 3 minutes total, prone heel press 5" holds 2 minutes total ?  ?  ?PATIENT EDUCATION:  ?Education details: on HEP, on using wall instead of floor, on strategies for HEP adherence. ?Person educated: Patient ?Education method: Explanation, Demonstration, and Handouts ?Education comprehension: verbalized understanding ?  ?  ?HOME EXERCISE PROGRAM: ?[id number] ?  ?ASSESSMENT: ?  ?CLINICAL IMPRESSION: ?03/12/2022 ?Session focused on brainstorming alternative position to floor exercises. Tolerated legs on wall well with hips and knee 90/90. Added standing stretches which were tolerated well. No pain noted during or after session. Will continue with current with  current POC as tolerated.  ? ?Eval: Patient is a 46 y.o. female who was seen today for physical therapy evaluation and treatment for low back pain and right knee pain. Patient presents today with limitations

## 2022-03-12 NOTE — Progress Notes (Signed)
? ?  I, Crystal Bean, LAT, ATC, am serving as scribe for Dr. [name]. ? ?Crystal Bean is a 46 y.o. female who presents to [place] at [facility] today for f/u of R-sided LBP w/ B leg pain and R knee pain.  She was last seen by Dr. Georgina Snell on 02/13/22 for LBP, R arm numbness and R knee pain and was referred to PT of which she's completed 7 visits.  Today, pt reports she has is feeling a good bit better, 50-75% improvement. Pt c/o continued LBP as her cc, worse w/ standing. Pt notes the wrist brace have resolved the UE paresthesias.  ? ?She also noted a nodule at the anterior superior right knee and had an x-ray at the last visit.  X-ray showed a small bone spur at the superior patella pole.  She notes this is only mildly painful at times. ? ?Diagnostic testing: C-spine XR- 02/11/22; L-spine XR- 12/23/18 ? ?Pertinent review of systems: No fevers or chills ? ?Relevant historical information: Hypertension ? ? ?Exam:  ?BP 118/80   Pulse 70   Ht '5\' 6"'$  (1.676 m)   Wt 221 lb 6.4 oz (100.4 kg)   LMP 02/13/2022 (Exact Date)   SpO2 97%   BMI 35.73 kg/m?  ?General: Well Developed, well nourished, and in no acute distress.  ? ?MSK: L-spine: Normal lumbar motion normal gait. ? ?Right knee: Nodule superior pole of patella quad tendon insertion.  Mildly tender to palpation. ? ? ? ?Lab and Radiology Results ? ?EXAM: ?RIGHT KNEE 3 VIEWS ?  ?COMPARISON:  None. ?  ?FINDINGS: ?No acute fracture or dislocation. Mild degenerative changes of the ?medial and lateral compartment with joint space narrowing and ?osteophyte formation. Enthesopathic changes of the quadriceps tendon ?insertion on the patella. No area of erosion or osseous destruction. ?No unexpected radiopaque foreign body. Soft tissues are ?unremarkable. ?  ?IMPRESSION: ?No acute fracture or dislocation. ?  ?  ?Electronically Signed ?  By: Valentino Saxon M.D. ?  On: 02/14/2022 07:53 ? ?EXAM: ?LUMBAR SPINE - COMPLETE 4+ VIEW ?  ?COMPARISON:   05/22/2018. ?  ?FINDINGS: ?Paraspinal soft tissues are normal. Diffuse multilevel degenerative ?change. No acute bony abnormality identified. No evidence of ?fracture. ?  ?IMPRESSION: ?Diffuse multilevel degenerative change.  No acute abnormality. ?  ?  ?Electronically Signed ?  By: McGrath ?  On: 12/24/2018 08:15 ?  ?I, [name], personally (independently) visualized and performed the interpretation of the images attached in this note. ? ? ? ? ? ?Assessment and Plan: ?46 y.o. female with low back pain.  Improving with PT.  Plan to continue PT and transition to home exercise program in the future.  Could consider repeat x-ray and lumbar spine MRI in the future if not improved enough.  MRI would be for potential facet injection planning. ? ?Carpal tunnel syndrome: Resolved with night splints.  Watchful waiting. ? ?Right knee nodule: Superior patella osteophyte seen on x-ray as the cause.  Functionally she is doing well enough that definitive treatment is not necessary.  Watchful waiting for now. ? ? ?Discussed warning signs or symptoms. Please see discharge instructions. Patient expresses understanding. ? ? ?The above documentation has been reviewed and is accurate and complete [name], M.D. ? ? ? ?

## 2022-03-13 ENCOUNTER — Ambulatory Visit (INDEPENDENT_AMBULATORY_CARE_PROVIDER_SITE_OTHER): Payer: Medicare Other | Admitting: Family Medicine

## 2022-03-13 ENCOUNTER — Other Ambulatory Visit: Payer: Self-pay

## 2022-03-13 ENCOUNTER — Telehealth: Payer: Self-pay | Admitting: Physician Assistant

## 2022-03-13 VITALS — BP 118/80 | HR 70 | Ht 66.0 in | Wt 221.4 lb

## 2022-03-13 DIAGNOSIS — G5601 Carpal tunnel syndrome, right upper limb: Secondary | ICD-10-CM

## 2022-03-13 DIAGNOSIS — M5441 Lumbago with sciatica, right side: Secondary | ICD-10-CM

## 2022-03-13 DIAGNOSIS — M25561 Pain in right knee: Secondary | ICD-10-CM | POA: Diagnosis not present

## 2022-03-13 DIAGNOSIS — G8929 Other chronic pain: Secondary | ICD-10-CM | POA: Diagnosis not present

## 2022-03-13 MED ORDER — AMPHETAMINE-DEXTROAMPHET ER 10 MG PO CP24: 10.0000 mg | ORAL_CAPSULE | Freq: Two times a day (BID) | ORAL | 0 refills | Status: DC

## 2022-03-13 NOTE — Patient Instructions (Addendum)
Thank you for coming in today.  ? ?Recheck as needed.  ? ?Continue exercises.  ? ?Continue wrist braces.  ? ? ?

## 2022-03-13 NOTE — Telephone Encounter (Signed)
Please cancel the prescription and then pend it to the above pharmacy.  Thank you.

## 2022-03-13 NOTE — Telephone Encounter (Signed)
Canceled Rx and repended to the requested pharmacy.  ?

## 2022-03-13 NOTE — Telephone Encounter (Signed)
Pt called at 1:56 pm and said that the pharmacy that we sent the generic adderall doesn't have it in stock. Please cancel and send a new script to the [facility] located at [place] ?

## 2022-03-14 ENCOUNTER — Encounter: Payer: Self-pay | Admitting: Physical Therapy

## 2022-03-14 ENCOUNTER — Ambulatory Visit (INDEPENDENT_AMBULATORY_CARE_PROVIDER_SITE_OTHER): Payer: Medicare Other | Admitting: Physical Therapy

## 2022-03-14 DIAGNOSIS — M6281 Muscle weakness (generalized): Secondary | ICD-10-CM | POA: Diagnosis not present

## 2022-03-14 DIAGNOSIS — R262 Difficulty in walking, not elsewhere classified: Secondary | ICD-10-CM | POA: Diagnosis not present

## 2022-03-14 NOTE — Therapy (Signed)
?OUTPATIENT PHYSICAL THERAPY TREATMENT NOTE  ? ? ?Patient Name: Crystal Bean ?MRN: [id number] ?DOB:April 20, 1976, 46 y.o., female ?Today's Date: 03/14/2022 ? ?PCP: Inda Coke, PA ?REFERRING PROVIDER: Inda Coke, PA ? ?END OF SESSION:  ? PT End of Session - 03/14/22 0846   ? ? Visit Number 8   ? Number of Visits 12   ? Date for PT Re-Evaluation 04/01/22   ? Authorization Type medicare   ? Progress Note Due on Visit 10   ? PT Start Time 0848   ? PT Stop Time 4801   ? PT Time Calculation (min) 39 min   ? Activity Tolerance Patient tolerated treatment well   ? ?  ?  ? ?  ? ? ?Past Medical History:  ?Diagnosis Date  ? ADHD (attention deficit hyperactivity disorder)   ? Anxiety   ? B12 deficiency   ? Back pain   ? Bipolar 1 disorder ([place])   ? Bulging lumbar disc   ? C. difficile diarrhea 04/24/2016  ? treated with metronidazole  ? Constipation   ? Depression   ? GERD (gastroesophageal reflux disease)   ? History of chicken pox   ? History of shingles 2013  ? Hypertension   ? IBS (irritable bowel syndrome)   ? Joint pain   ? Major depressive disorder   ? Migraines   ? Obesity   ? Obsessive-compulsive disorder   ? Osteoarthritis   ? Prediabetes   ? PTSD (post-traumatic stress disorder)   ? Sleep apnea   ? SOB (shortness of breath)   ? ?Past Surgical History:  ?Procedure Laterality Date  ? BLADDER SURGERY  1981  ? Urethera stretched  ? caps on teeth  1980  ? Caps on all Teeth  ? CESAREAN SECTION  2003  ? CORONARY STENT INTERVENTION N/A 12/28/2021  ? Procedure: CORONARY STENT INTERVENTION;  Surgeon: Martinique, Peter M, MD;  Location: [facility];  Service: Cardiovascular;  Laterality: N/A;  ? LEFT HEART CATH AND CORONARY ANGIOGRAPHY N/A 12/28/2021  ? Procedure: LEFT HEART CATH AND CORONARY ANGIOGRAPHY;  Surgeon: Martinique, Peter M, MD;  Location: [facility];  Service: Cardiovascular;  Laterality: N/A;  ? TONSILLECTOMY    ? WISDOM TOOTH EXTRACTION    ? ?Patient Active Problem List  ? Diagnosis Date Noted  ?  NSTEMI (non-ST elevated myocardial infarction) ([place]) 12/27/2021  ? Sore throat 11/08/2021  ? Mixed hyperlipidemia 06/05/2021  ? Vitamin D deficiency 06/05/2021  ? B12 deficiency 06/05/2021  ? Pre-diabetes 02/12/2021  ? Erythema nodosum 12/26/2020  ? Excessive daytime sleepiness 10/30/2020  ? Snoring 10/30/2020  ? Nocturia more than twice per night 10/30/2020  ? ADHD, adult residual type 10/30/2020  ? 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 Obsessive compulsive disorder 09/08/2018  ? Bipolar 1 disorder ([place]) 08/26/2018  ? Obesity 04/08/2018  ? Essential hypertension 04/08/2018  ? PTSD (post-traumatic stress disorder)   ? Osteoarthritis   ? Migraines   ? GERD (gastroesophageal reflux disease)   ? Major depressive disorder, recurrent episode with mixed features ([place])   ? Bulging lumbar disc   ? ?  ?PCP: Inda Coke, PA ?  ?REFERRING PROVIDER: [name], MD ?  ?REFERRING DIAG:  ?M25.561 (ICD-10-CM) - Acute pain of right knee ?M54.41,G89.29 (ICD-10-CM) - Chronic bilateral low back pain with right-sided sciatica ?  ?THERAPY DIAG:  ?Difficulty in walking, not elsewhere classified ?  ?Muscle weakness (generalized) ?  ?Other low back pain ?  ?Chronic pain of right knee ?  ?  ONSET DATE: back pain has been years ?  ?SUBJECTIVE:                                                                                                                                                                                          ?  ?SUBJECTIVE STATEMENT: ?03/14/2022 ?States that she tried her seated hip hinge and she was in intense pain. States that she is still very painful. States that she tried her wall stretches and that helped but she is still in pain. States that she has not been truthful to [place] or the MD as she doesn't feel as good as she is letting on. States that she is not getting better, she is still having pain. Yesterday she was having 7/10 yesterday with loading the dishwasher. States she did her other exercises and she felt a little bit  better. ? ? ?Eval:States that she has had back pain for years and has done PT 2x. States that she felt good after PT. States that she has no space between L4/5 and when it gets bad she has pain into her right leg. States when it is really bad it shoots down both sides. Standing is the worse. States she can't sweep the floor. States she usually pushes through the pain. States that biking hurts and she likes to swim and she walks in the pool. States that she has a knot on her right and if you hit it it hurts really bad. States stairs are difficult and she cannot start with there right leg as it is painful. ?  ?  ?PERTINENT HISTORY:  ?hx of MI- radial artery cardiac catherization in 12/28/21 ?Bipolar, depression  ?  ?PAIN:  ?Are you having pain? no: NPRS scale: 3/10 ?Pain location: across low back and then down back of the leg  ?Pain description: sharp and intense, localized on right side  ?Aggravating factors: standing > 15 minutes, prolonged sitting, bending ?Relieving factors: sitting, rest , swimming ?  ?  ?PRECAUTIONS: None ?  ?WEIGHT BEARING RESTRICTIONS No ?  ?FALLS:  ?Has patient fallen in last 6 months? No ?  ?  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  ?PLOF: Independent ?  ?PATIENT GOALS be able to vacuum and mop without pain ?  ?  ?OBJECTIVE:  ?  ?DIAGNOSTIC FINDINGS:   ?R KNEE XRAY: ?IMPRESSION: ?No acute fracture or dislocation ?  ?  ?  ?SCREENING FOR RED FLAGS: ?Bowel or bladder incontinence: No ?Spinal tumors: No ?Cauda equina syndrome: No ?Compression fracture: No ?Abdominal aneurysm: No ?  ?COGNITION: ?          Overall  cognitive status: Within functional limits for tasks assessed               ?           ?SENSATION: ?WFL ?  ?MUSCLE LENGTH: ?Hamstrings: Right 30 deg; Left 30 deg ?  ?  ?POSTURE:  ?Limited lumbo pelvic motion, forward head and kyphotic posture ?  ?PALPATION: ?hypersensitivity over L4/5, tenderness to palpation along right lumbar paraspinals and glutes ?  ?LUMBAR ROM:   ?  ?Active  A/PROM  ?03/07/2022  ?Flexion 50% limited(slight pain)  ?Extension 75% limited  ?Right lateral flexion 50% limited  ?Left lateral flexion 25% limited  ?Right rotation    ?Left rotation    ? (Blank rows = not tested) ?                      No lumbar/pelvic motion noted with any motions ?  ?           LE Measurements ?      ?Lower Extremity Right ?03/07/2022 Left ?03/07/2022  ?  A/PROM MMT A/PROM MMT  ?Hip Flexion 110 4+ 110 4+  ?Hip Extension   4   4+  ?Hip Abduction          ?Hip Adduction          ?Hip Internal rotation 15   15    ?Hip External rotation 50   50    ?Knee Flexion   4+   4+  ?Knee Extension   4+   4+  ?Ankle Dorsiflexion   5   5  ?Ankle Plantarflexion          ?Ankle Inversion          ?Ankle Eversion          ? (Blank rows = not tested) ?           * pain ?  ? ?  ?  ?  ?  ?TODAY'S TREATMENT  ?03/14/2022 ?Therapeutic Exercise: ?  supine with hips/knees 90/90 on ball 3 minutes, LTR 2 minutes, hamstring iso into ball 2 minutes ? Manual: gentle traction on ball 10 minutes ? ?Previous interventions:  ?Neuromuscular Re-education: hip hinge - forward reaching over table 12 minutes total - tactile and verbal cues - with and without dowel ?Supine:bridges 3x10 5" holds, SKC  20" holds 5 bilateral, LTR 2 minutes total ?Prone: prone press ups 2 minutes, hamstring curls 5" holds 3 minutes total, prone heel press 5" holds 2 minutes total ?  ?  ?PATIENT EDUCATION:  ?Education details: on typical progression, on importance of honesty with how she is feeling better, on current presentation and plan moving forward. On lumbar roll with seated posture position with sitting. ?Person educated: Patient ?Education method: Explanation, Demonstration, and Handouts ?Education comprehension: verbalized understanding ?  ?  ?HOME EXERCISE PROGRAM: ?[id number] ?  ?ASSESSMENT: ?  ?CLINICAL IMPRESSION: ?03/14/2022 ? Reassurance and education focus of session. Patient upset as she ahs not been honest with her health care  providers because she feels like she should be getting better. Reduced pain noted end of session. Placed hip hinge exercise on hold for HEP until exercise is reviewed in clinic.  ? ?Eval: Patient is a 46 y.o.

## 2022-03-27 ENCOUNTER — Encounter: Payer: Medicare Other | Admitting: Physical Therapy

## 2022-03-28 ENCOUNTER — Ambulatory Visit (INDEPENDENT_AMBULATORY_CARE_PROVIDER_SITE_OTHER): Payer: Medicare Other | Admitting: Physical Therapy

## 2022-03-28 ENCOUNTER — Encounter: Payer: Self-pay | Admitting: Physical Therapy

## 2022-03-28 DIAGNOSIS — M6281 Muscle weakness (generalized): Secondary | ICD-10-CM | POA: Diagnosis not present

## 2022-03-28 DIAGNOSIS — R262 Difficulty in walking, not elsewhere classified: Secondary | ICD-10-CM

## 2022-03-28 NOTE — Therapy (Signed)
?OUTPATIENT PHYSICAL THERAPY TREATMENT NOTE  ? ? ?Patient Name: Crystal Bean ?MRN: [id number] ?DOB:December 08, 1975, 46 y.o., female ?Today's Date: 03/28/2022 ? ?PCP: Inda Coke, PA ?REFERRING PROVIDER: Inda Coke, PA ? ?END OF SESSION:  ? PT End of Session - 03/28/22 1600   ? ? Visit Number 9   ? Number of Visits 12   ? Date for PT Re-Evaluation 04/01/22   ? Authorization Type medicare   ? Progress Note Due on Visit 10   ? PT Start Time 1601   ? PT Stop Time 1640   ? PT Time Calculation (min) 39 min   ? Activity Tolerance Patient tolerated treatment well   ? ?  ?  ? ?  ? ? ?Past Medical History:  ?Diagnosis Date  ? ADHD (attention deficit hyperactivity disorder)   ? Anxiety   ? B12 deficiency   ? Back pain   ? Bipolar 1 disorder ([place])   ? Bulging lumbar disc   ? C. difficile diarrhea 04/24/2016  ? treated with metronidazole  ? Constipation   ? Depression   ? GERD (gastroesophageal reflux disease)   ? History of chicken pox   ? History of shingles 2013  ? Hypertension   ? IBS (irritable bowel syndrome)   ? Joint pain   ? Major depressive disorder   ? Migraines   ? Obesity   ? Obsessive-compulsive disorder   ? Osteoarthritis   ? Prediabetes   ? PTSD (post-traumatic stress disorder)   ? Sleep apnea   ? SOB (shortness of breath)   ? ?Past Surgical History:  ?Procedure Laterality Date  ? BLADDER SURGERY  1981  ? Urethera stretched  ? caps on teeth  1980  ? Caps on all Teeth  ? CESAREAN SECTION  2003  ? CORONARY STENT INTERVENTION N/A 12/28/2021  ? Procedure: CORONARY STENT INTERVENTION;  Surgeon: Martinique, Peter M, MD;  Location: [facility];  Service: Cardiovascular;  Laterality: N/A;  ? LEFT HEART CATH AND CORONARY ANGIOGRAPHY N/A 12/28/2021  ? Procedure: LEFT HEART CATH AND CORONARY ANGIOGRAPHY;  Surgeon: Martinique, Peter M, MD;  Location: [facility];  Service: Cardiovascular;  Laterality: N/A;  ? TONSILLECTOMY    ? WISDOM TOOTH EXTRACTION    ? ?Patient Active Problem List  ? Diagnosis Date Noted  ?  NSTEMI (non-ST elevated myocardial infarction) ([place]) 12/27/2021  ? Sore throat 11/08/2021  ? Mixed hyperlipidemia 06/05/2021  ? Vitamin D deficiency 06/05/2021  ? B12 deficiency 06/05/2021  ? Pre-diabetes 02/12/2021  ? Erythema nodosum 12/26/2020  ? Excessive daytime sleepiness 10/30/2020  ? Snoring 10/30/2020  ? Nocturia more than twice per night 10/30/2020  ? ADHD, adult residual type 10/30/2020  ? 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 Obsessive compulsive disorder 09/08/2018  ? Bipolar 1 disorder ([place]) 08/26/2018  ? Obesity 04/08/2018  ? Essential hypertension 04/08/2018  ? PTSD (post-traumatic stress disorder)   ? Osteoarthritis   ? Migraines   ? GERD (gastroesophageal reflux disease)   ? Major depressive disorder, recurrent episode with mixed features ([place])   ? Bulging lumbar disc   ? ?  ?PCP: Inda Coke, PA ?  ?REFERRING PROVIDER: [name], MD ?  ?REFERRING DIAG:  ?M25.561 (ICD-10-CM) - Acute pain of right knee ?M54.41,G89.29 (ICD-10-CM) - Chronic bilateral low back pain with right-sided sciatica ?  ?THERAPY DIAG:  ?Difficulty in walking, not elsewhere classified ?  ?Muscle weakness (generalized) ?  ?Other low back pain ?  ?Chronic pain of right knee ?  ?  ONSET DATE: back pain has been years ?  ?SUBJECTIVE:                                                                                                                                                                                          ?  ?SUBJECTIVE STATEMENT: ?03/28/2022 ?States her back is about the same. States she has been busy and hasn't done as many exercises as she would like because she is moving in 3 months and there is no room to do her exercises.  ? ? ?Eval:States that she has had back pain for years and has done PT 2x. States that she felt good after PT. States that she has no space between L4/5 and when it gets bad she has pain into her right leg. States when it is really bad it shoots down both sides. Standing is the worse. States she can't  sweep the floor. States she usually pushes through the pain. States that biking hurts and she likes to swim and she walks in the pool. States that she has a knot on her right and if you hit it it hurts really bad. States stairs are difficult and she cannot start with there right leg as it is painful. ?  ?  ?PERTINENT HISTORY:  ?hx of MI- radial artery cardiac catherization in 12/28/21 ?Bipolar, depression  ?  ?PAIN:  ?Are you having pain? no: NPRS scale: 2-3/10 ?Pain location: across low back and then down back of the leg  ?Pain description: sharp and intense, localized on right side  ?Aggravating factors: standing > 15 minutes, prolonged sitting, bending ?Relieving factors: sitting, rest , swimming ?  ?  ?PRECAUTIONS: None ?  ?WEIGHT BEARING RESTRICTIONS No ?  ?FALLS:  ?Has patient fallen in last 6 months? No ?  ?  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  ?PLOF: Independent ?  ?PATIENT GOALS be able to vacuum and mop without pain ?  ?  ?OBJECTIVE:  ?  ?DIAGNOSTIC FINDINGS:   ?R KNEE XRAY: ?IMPRESSION: ?No acute fracture or dislocation ?  ?  ?  ?SCREENING FOR RED FLAGS: ?Bowel or bladder incontinence: No ?Spinal tumors: No ?Cauda equina syndrome: No ?Compression fracture: No ?Abdominal aneurysm: No ?  ?COGNITION: ?          Overall cognitive status: Within functional limits for tasks assessed               ?           ?SENSATION: ?WFL ?  ?MUSCLE LENGTH: ?Hamstrings: Right 30 deg; Left 30 deg ?  ?  ?POSTURE:  ?Limited lumbo pelvic motion,  forward head and kyphotic posture ?  ?PALPATION: ?hypersensitivity over L4/5, tenderness to palpation along right lumbar paraspinals and glutes ?  ?LUMBAR ROM:  ?  ?Active  A/PROM  ?03/07/2022  ?Flexion 50% limited(slight pain)  ?Extension 75% limited  ?Right lateral flexion 50% limited  ?Left lateral flexion 25% limited  ?Right rotation    ?Left rotation    ? (Blank rows = not tested) ?                      No lumbar/pelvic motion noted with any motions ?  ?            LE Measurements ?      ?Lower Extremity Right ?03/07/2022 Left ?03/07/2022  ?  A/PROM MMT A/PROM MMT  ?Hip Flexion 110 4+ 110 4+  ?Hip Extension   4   4+  ?Hip Abduction          ?Hip Adduction          ?Hip Internal rotation 15   15    ?Hip External rotation 50   50    ?Knee Flexion   4+   4+  ?Knee Extension   4+   4+  ?Ankle Dorsiflexion   5   5  ?Ankle Plantarflexion          ?Ankle Inversion          ?Ankle Eversion          ? (Blank rows = not tested) ?           * pain ?  ? ?  ?  ?  ?  ?TODAY'S TREATMENT  ?03/28/2022 ?Therapeutic Exercise: ?  supine with hips/knees 90/90 on ball 3 minutes, LTR 2 minutes, hamstring iso into ball 2 minutes DKC on ball 2 minutes, SAQs on ball with posterior pelvic tilt 3 minutes alternating legs ?SLR with TRA activation 4x5 bilateral, bent knee lower with TRA activation 3x5  ? Manual: gentle traction on ball 10 minutes, bridges 5x5 5" holds ? ?Previous interventions:  ?Neuromuscular Re-education: hip hinge - forward reaching over table 12 minutes total - tactile and verbal cues - with and without dowel ?Supine:bridges 3x10 5" holds, SKC  20" holds 5 bilateral, LTR 2 minutes total ?Prone: prone press ups 2 minutes, hamstring curls 5" holds 3 minutes total, prone heel press 5" holds 2 minutes total ?  ?  ?PATIENT EDUCATION:  ?Education details: on HEP and current presentation ?Person educated: Patient ?Education method: Explanation, Demonstration, and Handouts ?Education comprehension: verbalized understanding ?  ?  ?HOME EXERCISE PROGRAM: ?[id number] ?  ?ASSESSMENT: ?  ?CLINICAL IMPRESSION: ?03/28/2022 ?Session started with ball exercises as these have helped with pain previously. Added in core strengthening exercises which were also tolerated well. Reassured patient throughout session. Overall improved tolerance to interventions on this date. Will continue with current POC. ? ?Eval: Patient is a 46 y.o. female who was seen today for physical therapy evaluation and treatment for  low back pain and right knee pain. Patient presents today with limitations in ROM and strength and limited lumbopelvic movements that limit patient's overall function. Patient with positive response from PT

## 2022-04-03 ENCOUNTER — Encounter: Payer: Medicare Other | Admitting: Physical Therapy

## 2022-04-04 ENCOUNTER — Ambulatory Visit (INDEPENDENT_AMBULATORY_CARE_PROVIDER_SITE_OTHER): Payer: Medicare Other | Admitting: Physical Therapy

## 2022-04-04 ENCOUNTER — Encounter: Payer: Self-pay | Admitting: Physical Therapy

## 2022-04-04 DIAGNOSIS — G8929 Other chronic pain: Secondary | ICD-10-CM

## 2022-04-04 DIAGNOSIS — R262 Difficulty in walking, not elsewhere classified: Secondary | ICD-10-CM

## 2022-04-04 DIAGNOSIS — M25561 Pain in right knee: Secondary | ICD-10-CM | POA: Diagnosis not present

## 2022-04-04 DIAGNOSIS — M6281 Muscle weakness (generalized): Secondary | ICD-10-CM | POA: Diagnosis not present

## 2022-04-04 DIAGNOSIS — M5459 Other low back pain: Secondary | ICD-10-CM | POA: Diagnosis not present

## 2022-04-04 NOTE — Therapy (Signed)
OUTPATIENT PHYSICAL THERAPY TREATMENT NOTE and RECERT   Patient Name: Crystal Bean MRN: [id number] DOB:[birth year], 46 y.o., female Today's Date: 04/04/2022  PCP: Inda Coke, PA REFERRING PROVIDER: Inda Coke, PA  END OF SESSION:   PT End of Session - 04/04/22 1552     Visit Number 10    Number of Visits 14    Date for PT Re-Evaluation 05/02/22    Authorization Type medicare    Progress Note Due on Visit 20    PT Start Time 4481    PT Stop Time 8563    PT Time Calculation (min) 40 min    Activity Tolerance Patient tolerated treatment well             Past Medical History:  Diagnosis Date   ADHD (attention deficit hyperactivity disorder)    Anxiety    B12 deficiency    Back pain    Bipolar 1 disorder ([place])    Bulging lumbar disc    C. difficile diarrhea 04/24/2016   treated with metronidazole   Constipation    Depression    GERD (gastroesophageal reflux disease)    History of chicken pox    History of shingles 2013   Hypertension    IBS (irritable bowel syndrome)    Joint pain    Major depressive disorder    Migraines    Obesity    Obsessive-compulsive disorder    Osteoarthritis    Prediabetes    PTSD (post-traumatic stress disorder)    Sleep apnea    SOB (shortness of breath)    Past Surgical History:  Procedure Laterality Date   BLADDER SURGERY  1981   Urethera stretched   caps on teeth  1980   Caps on all Teeth   CESAREAN SECTION  2003   CORONARY STENT INTERVENTION N/A 12/28/2021   Procedure: CORONARY STENT INTERVENTION;  Surgeon: Martinique, Peter M, MD;  Location: [facility];  Service: Cardiovascular;  Laterality: N/A;   LEFT HEART CATH AND CORONARY ANGIOGRAPHY N/A 12/28/2021   Procedure: LEFT HEART CATH AND CORONARY ANGIOGRAPHY;  Surgeon: Martinique, Peter M, MD;  Location: [facility];  Service: Cardiovascular;  Laterality: N/A;   TONSILLECTOMY     WISDOM TOOTH EXTRACTION     Patient Active Problem List   Diagnosis Date  Noted   NSTEMI (non-ST elevated myocardial infarction) ([place]) 12/27/2021   Sore throat 11/08/2021   Mixed hyperlipidemia 06/05/2021   Vitamin D deficiency 06/05/2021   B12 deficiency 06/05/2021   Pre-diabetes 02/12/2021   Erythema nodosum 12/26/2020   Excessive daytime sleepiness 10/30/2020   Snoring 10/30/2020   Nocturia more than twice per night 10/30/2020   ADHD, adult residual type 10/30/2020   Retrognathia 10/30/2020   Obsessive compulsive disorder 09/08/2018   Bipolar 1 disorder ([place]) 08/26/2018   Obesity 04/08/2018   Essential hypertension 04/08/2018   PTSD (post-traumatic stress disorder)    Osteoarthritis    Migraines    GERD (gastroesophageal reflux disease)    Major depressive disorder, recurrent episode with mixed features ([place])    Bulging lumbar disc      PCP: Inda Coke, [place]   REFERRING PROVIDER: [name], MD   REFERRING DIAG:  [phone] (ICD-10-CM) - Acute pain of right knee M54.41,G89.29 (ICD-10-CM) - Chronic bilateral low back pain with right-sided sciatica   THERAPY DIAG:  Difficulty in walking, not elsewhere classified   Muscle weakness (generalized)   Other low back pain   Chronic pain of right knee  ONSET DATE: back pain has been years   SUBJECTIVE:                                                                                                                                                                                            SUBJECTIVE STATEMENT: 04/04/2022 States that she feels about the same. 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 States that overall she feels 20-30% better in regards to strength/mobility and 0% better in pain. Can clean for 10-15 minutes without needing to sit and rest   Eval:States that she has had back pain for years and has done PT 2x. States that she felt good after PT. States that she has no space between L4/5 and when it gets bad she has pain into her right leg. States  when it is really bad it shoots down both sides. Standing is the worse. States she can't sweep the floor. States she usually pushes through the pain. States that biking hurts and she likes to swim and she walks in the pool. States that she has a knot on her right and if you hit it it hurts really bad. States stairs are difficult and she cannot start with there right leg as it is painful.     PERTINENT HISTORY:  hx of MI- radial artery cardiac catherization in 12/28/21 Bipolar, depression    PAIN:  Are you having pain? no: NPRS scale: 3/10 Pain location: across low back Pain description: sharp and intense, localized on right side  Aggravating factors: standing > 15 minutes, prolonged sitting, bending Relieving factors: sitting, rest , swimming     PRECAUTIONS: None   WEIGHT BEARING RESTRICTIONS No   FALLS:  Has patient fallen in last 6 months? 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   PLOF: Independent   PATIENT GOALS be able to vacuum and mop without pain     OBJECTIVE:    DIAGNOSTIC FINDINGS:   R KNEE XRAY: IMPRESSION: No acute fracture or dislocation       SCREENING FOR RED FLAGS: Bowel or bladder incontinence: No Spinal tumors: No Cauda equina syndrome: No Compression fracture: No Abdominal aneurysm: No   COGNITION:           Overall cognitive status: Within functional limits for tasks assessed                          SENSATION: WFL   MUSCLE LENGTH: Hamstrings: Right 30 deg; Left 30 deg     POSTURE:  Limited lumbo pelvic motion, forward head and kyphotic posture   PALPATION: hypersensitivity over L4/5, tenderness to palpation along right lumbar paraspinals and glutes   LUMBAR ROM:    Active  A/PROM  04/04/2022  Flexion 50% limited  Extension 50% limited  Right lateral flexion 25% limited  Left lateral flexion 25% limited  Right rotation    Left rotation     (Blank rows = not tested)                       No  lumbar/pelvic motion noted with any motions              LE Measurements       Lower Extremity Right 04/04/2022 Left 04/04/2022     A/PROM MMT A/PROM MMT  Hip Flexion 110 4+* 110 4+  Hip Extension   4   4+  Hip Abduction          Hip Adduction          Hip Internal rotation 15   15    Hip External rotation 50   50    Knee Flexion   4+   4+  Knee Extension   4+*   4+  Ankle Dorsiflexion   5   5  Ankle Plantarflexion          Ankle Inversion          Ankle Eversion           (Blank rows = not tested)            * pain/discomfort            TODAY'S TREATMENT  04/04/2022 Therapeutic Exercise:   supine with hips/knees 90/90 on ball LTR 2 minutes, hamstring iso into ball 2 minutes DKC on ball 2 minutes, with Posterior pelvic tilt and shoulder flexion 2 minutes, shoulder protraction with 5# weights and posterior pelvic tilt with 90/90 and inhale 2 minutes, horizontal shoulder abd 2 minutes Standing: shoulder ER at wall 23minutes, shoulder flexion facing wall 3minutes Seated lumbar flexion x10     Previous interventions:  Neuromuscular Re-education: hip hinge - forward reaching over table 12 minutes total - tactile and verbal cues - with and without dowel Supine:bridges 3x10 5" holds, SKC  20" holds 5 bilateral, LTR 2 minutes total Prone: prone press ups 2 minutes, hamstring curls 5" holds 3 minutes total, prone heel press 5" holds 2 minutes total     PATIENT EDUCATION:  Education details: on HEP and current presentation Person educated: Patient Education method: Explanation, Demonstration, and Handouts Education comprehension: verbalized understanding     HOME EXERCISE PROGRAM: [id number]   ASSESSMENT:   CLINICAL IMPRESSION: 04/04/2022 Continued to progress exercises as tolerated. Active muscle stretches more challenging than passive ones. No increase in pain. Overall painfree motions improved but patient continues to have pain with functional activity. Reviewed goals and  current POC. Extending POC to continue to work on goals and develop HEP. Slight tension across back noted end of session.  Eval: Patient is a 46 y.o. female who was seen today for physical therapy evaluation and treatment for low back pain and right knee pain. Patient presents today with limitations in ROM and strength and limited lumbopelvic movements that limit patient's overall function. Patient with positive response from PT previously for treatment of low back pain and would greatly benefit from skilled PT to improve overall function and quality of life.      OBJECTIVE  IMPAIRMENTS decreased activity tolerance, decreased mobility, difficulty walking, decreased ROM, decreased strength, postural dysfunction, and pain.    ACTIVITY LIMITATIONS cleaning, meal prep, yard work, and shopping.    PERSONAL FACTORS Age and Time since onset of injury/illness/exacerbation are also affecting patient's functional outcome.      REHAB POTENTIAL: Good   CLINICAL DECISION MAKING: Stable/uncomplicated   EVALUATION COMPLEXITY: Low     GOALS: Goals reviewed with patient?  yes   SHORT TERM GOALS:   Patient will be independent in self management strategies to improve quality of life and functional outcomes. Baseline: new program Target date: 03/11/2022 Goal status: MET   2.  Patient will report at least 50% improvement in overall symptoms and/or function to demonstrate improved functional mobility Baseline: 0% Target date: 03/11/2022 Goal status: PROGRESSING   3.  Patient will be able to perform hip hinge motion with good form and occasional cues to improve lumbopelvic mobility Baseline: unable Target date: 03/11/2022 Goal status: PROGRESSING         LONG TERM GOALS:   Patient will report at least 75% improvement in overall symptoms and/or function to demonstrate improved functional mobility Baseline: 0% Target date: 04/01/2022 Goal status: PROGRESSING   2.  Patient will be able to  vacuum/clean for at least 20 minutes without needing to sit and rest secondary to back pain to improve ability to perform house chores. Baseline: unable Target date: 04/01/2022 Goal status: PROGRESSING   3.  Patient will perform painfree MMT to demonstrate improved tolerance to muscle activation Baseline: painful Target date: 04/01/2022 Goal status: MET   4.  Patient will be able to demonstrate pain free lumbar ROM Baseline: painful Target date: 04/01/2022 Goal status: MET       PLAN: PT FREQUENCY: 1x/week   PT DURATION: 4 weeks   PLANNED INTERVENTIONS: Therapeutic exercises, Therapeutic activity, Neuromuscular re-education, Balance training, Gait training, Patient/Family education, Joint mobilization, Aquatic Therapy, Dry Needling, Cryotherapy, Moist heat, and Manual therapy.   PLAN FOR NEXT SESSION: hip hinge, lumbopelvic mobility, pelvic tilts, glute activation bridges, traction   4:36 PM, 04/04/22 Jerene Pitch, DPT Physical Therapy with [facility]  [phone] office

## 2022-04-06 ENCOUNTER — Other Ambulatory Visit: Payer: Self-pay | Admitting: Physician Assistant

## 2022-04-10 ENCOUNTER — Encounter: Payer: Medicare Other | Admitting: Physical Therapy

## 2022-04-11 ENCOUNTER — Encounter: Payer: Self-pay | Admitting: Physical Therapy

## 2022-04-11 ENCOUNTER — Ambulatory Visit (INDEPENDENT_AMBULATORY_CARE_PROVIDER_SITE_OTHER): Payer: Medicare Other | Admitting: Physical Therapy

## 2022-04-11 DIAGNOSIS — M6281 Muscle weakness (generalized): Secondary | ICD-10-CM

## 2022-04-11 DIAGNOSIS — M5459 Other low back pain: Secondary | ICD-10-CM | POA: Diagnosis not present

## 2022-04-11 DIAGNOSIS — R262 Difficulty in walking, not elsewhere classified: Secondary | ICD-10-CM | POA: Diagnosis not present

## 2022-04-11 NOTE — Therapy (Signed)
OUTPATIENT PHYSICAL THERAPY TREATMENT NOTE and RECERT   Patient Name: Crystal Bean MRN: [id number] DOB:24-Apr-1976, 46 y.o., female Today's Date: 04/11/2022  PCP: Inda Coke, PA REFERRING PROVIDER: Inda Coke, PA  END OF SESSION:   PT End of Session - 04/11/22 1600     Visit Number 11    Number of Visits 14    Date for PT Re-Evaluation 05/02/22    Authorization Type medicare    Progress Note Due on Visit 57    PT Start Time 1103    PT Stop Time 1594    PT Time Calculation (min) 38 min    Activity Tolerance Patient tolerated treatment well             Past Medical History:  Diagnosis Date   ADHD (attention deficit hyperactivity disorder)    Anxiety    B12 deficiency    Back pain    Bipolar 1 disorder ([place])    Bulging lumbar disc    C. difficile diarrhea 04/24/2016   treated with metronidazole   Constipation    Depression    GERD (gastroesophageal reflux disease)    History of chicken pox    History of shingles 2013   Hypertension    IBS (irritable bowel syndrome)    Joint pain    Major depressive disorder    Migraines    Obesity    Obsessive-compulsive disorder    Osteoarthritis    Prediabetes    PTSD (post-traumatic stress disorder)    Sleep apnea    SOB (shortness of breath)    Past Surgical History:  Procedure Laterality Date   BLADDER SURGERY  1981   Urethera stretched   caps on teeth  1980   Caps on all Teeth   CESAREAN SECTION  2003   CORONARY STENT INTERVENTION N/A 12/28/2021   Procedure: CORONARY STENT INTERVENTION;  Surgeon: Martinique, Peter M, MD;  Location: [facility];  Service: Cardiovascular;  Laterality: N/A;   LEFT HEART CATH AND CORONARY ANGIOGRAPHY N/A 12/28/2021   Procedure: LEFT HEART CATH AND CORONARY ANGIOGRAPHY;  Surgeon: Martinique, Peter M, MD;  Location: [facility];  Service: Cardiovascular;  Laterality: N/A;   TONSILLECTOMY     WISDOM TOOTH EXTRACTION     Patient Active Problem List   Diagnosis Date  Noted   NSTEMI (non-ST elevated myocardial infarction) ([place]) 12/27/2021   Sore throat 11/08/2021   Mixed hyperlipidemia 06/05/2021   Vitamin D deficiency 06/05/2021   B12 deficiency 06/05/2021   Pre-diabetes 02/12/2021   Erythema nodosum 12/26/2020   Excessive daytime sleepiness 10/30/2020   Snoring 10/30/2020   Nocturia more than twice per night 10/30/2020   ADHD, adult residual type 10/30/2020   Retrognathia 10/30/2020   Obsessive compulsive disorder 09/08/2018   Bipolar 1 disorder ([place]) 08/26/2018   Obesity 04/08/2018   Essential hypertension 04/08/2018   PTSD (post-traumatic stress disorder)    Osteoarthritis    Migraines    GERD (gastroesophageal reflux disease)    Major depressive disorder, recurrent episode with mixed features ([place])    Bulging lumbar disc      PCP: Inda Coke, [place]   REFERRING PROVIDER: [name], MD   REFERRING DIAG:  [phone] (ICD-10-CM) - Acute pain of right knee M54.41,G89.29 (ICD-10-CM) - Chronic bilateral low back pain with right-sided sciatica   THERAPY DIAG:  Difficulty in walking, not elsewhere classified   Muscle weakness (generalized)   Other low back pain   Chronic pain of right knee  ONSET DATE: back pain has been years   SUBJECTIVE:                                                                                                                                                                                            SUBJECTIVE STATEMENT: 04/11/2022 States she is feeling tight and tired as she has been doing a lot today.    Eval:States that she has had back pain for years and has done PT 2x. States that she felt good after PT. States that she has no space between L4/5 and when it gets bad she has pain into her right leg. States when it is really bad it shoots down both sides. Standing is the worse. States she can't sweep the floor. States she usually pushes through the pain. States that biking hurts and she likes to swim  and she walks in the pool. States that she has a knot on her right and if you hit it it hurts really bad. States stairs are difficult and she cannot start with there right leg as it is painful.     PERTINENT HISTORY:  hx of MI- radial artery cardiac catherization in 12/28/21 Bipolar, depression    PAIN:  Are you having pain? no: NPRS scale: 2/10 Pain location: across low back Pain description: sharp and intense, localized on right side  Aggravating factors: standing > 15 minutes, prolonged sitting, bending Relieving factors: sitting, rest , swimming     PRECAUTIONS: None   WEIGHT BEARING RESTRICTIONS No   FALLS:  Has patient fallen in last 6 months? 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   PLOF: Independent   PATIENT GOALS be able to vacuum and mop without pain     OBJECTIVE:    DIAGNOSTIC FINDINGS:   R KNEE XRAY: IMPRESSION: No acute fracture or dislocation       SCREENING FOR RED FLAGS: Bowel or bladder incontinence: No Spinal tumors: No Cauda equina syndrome: No Compression fracture: No Abdominal aneurysm: No   COGNITION:           Overall cognitive status: Within functional limits for tasks assessed                          SENSATION: WFL   MUSCLE LENGTH: Hamstrings: Right 30 deg; Left 30 deg     POSTURE:  Limited lumbo pelvic motion, forward head and kyphotic posture   PALPATION: hypersensitivity over L4/5, tenderness to palpation along right lumbar paraspinals and glutes   LUMBAR ROM:    Active  A/PROM  04/04/22  Flexion 50% limited  Extension 50% limited  Right lateral flexion 25% limited  Left lateral flexion 25% limited  Right rotation    Left rotation     (Blank rows = not tested)                       No lumbar/pelvic motion noted with any motions              LE Measurements       Lower Extremity Right 04/04/22 Left 04/04/22     A/PROM MMT A/PROM MMT  Hip Flexion 110 4+* 110 4+  Hip Extension   4   4+   Hip Abduction          Hip Adduction          Hip Internal rotation 15   15    Hip External rotation 50   50    Knee Flexion   4+   4+  Knee Extension   4+*   4+  Ankle Dorsiflexion   5   5  Ankle Plantarflexion          Ankle Inversion          Ankle Eversion           (Blank rows = not tested)            * pain/discomfort            TODAY'S TREATMENT  04/11/2022 Therapeutic Exercise:  Supine with hips/knees 90/90 on ball LTR 3 minutes, hamstring iso into ball 3 minutes DKC on ball 3 minutes, piriformis stretch on ball x3 bilateral 1 minute each; with Posterior pelvic tilt and shoulder flexion 2 minutes S/l trunk rotation x2 1 minute each Bilateral  Standing: shoulder ER at wall 4mnutes, shoulder flexion facing wall x2 141mute bouts      Manual: traction to lumbar spine on ball 5 minutes       PATIENT EDUCATION:  Education details: on HEP  Person educated: Patient Education method: ExConsulting civil engineerDeMedia plannerand Handouts Education comprehension: verbalized understanding     HOME EXERCISE PROGRAM: [id number] ASSESSMENT:   CLINICAL IMPRESSION: 04/11/2022 Continued with lumbar decompressive exercises. Tolerated well. Reduced symptoms noted afterwards.Added rotational stretches today which were tolerated well. Will continue with current POC as tolerated.   Eval: Patient is a [age].o. female who was seen today for physical therapy evaluation and treatment for low back pain and right knee pain. Patient presents today with limitations in ROM and strength and limited lumbopelvic movements that limit patient's overall function. Patient with positive response from PT previously for treatment of low back pain and would greatly benefit from skilled PT to improve overall function and quality of life.      OBJECTIVE IMPAIRMENTS decreased activity tolerance, decreased mobility, difficulty walking, decreased ROM, decreased strength, postural dysfunction, and pain.    ACTIVITY  LIMITATIONS cleaning, meal prep, yard work, and shopping.    PERSONAL FACTORS Age and Time since onset of injury/illness/exacerbation are also affecting patient's functional outcome.      REHAB POTENTIAL: Good   CLINICAL DECISION MAKING: Stable/uncomplicated   EVALUATION COMPLEXITY: Low     GOALS: Goals reviewed with patient?  yes   SHORT TERM GOALS:   Patient will be independent in self management strategies to improve quality of life and functional outcomes. Baseline: new program Target date: 03/11/2022 Goal status: MET   2.  Patient will report at least 50% improvement  in overall symptoms and/or function to demonstrate improved functional mobility Baseline: 0% Target date: 03/11/2022 Goal status: PROGRESSING   3.  Patient will be able to perform hip hinge motion with good form and occasional cues to improve lumbopelvic mobility Baseline: unable Target date: 03/11/2022 Goal status: PROGRESSING         LONG TERM GOALS:   Patient will report at least 75% improvement in overall symptoms and/or function to demonstrate improved functional mobility Baseline: 0% Target date: 04/01/2022 Goal status: PROGRESSING   2.  Patient will be able to vacuum/clean for at least 20 minutes without needing to sit and rest secondary to back pain to improve ability to perform house chores. Baseline: unable Target date: 04/01/2022 Goal status: PROGRESSING   3.  Patient will perform painfree MMT to demonstrate improved tolerance to muscle activation Baseline: painful Target date: 04/01/2022 Goal status: MET   4.  Patient will be able to demonstrate pain free lumbar ROM Baseline: painful Target date: 04/01/2022 Goal status: MET       PLAN: PT FREQUENCY: 1x/week   PT DURATION: 4 weeks   PLANNED INTERVENTIONS: Therapeutic exercises, Therapeutic activity, Neuromuscular re-education, Balance training, Gait training, Patient/Family education, Joint mobilization, Aquatic Therapy, Dry  Needling, Cryotherapy, Moist heat, and Manual therapy.   PLAN FOR NEXT SESSION: hip hinge, lumbopelvic mobility, pelvic tilts, glute activation bridges, traction   4:39 PM, 04/11/22 Jerene Pitch, DPT Physical Therapy with [facility]  [phone] office

## 2022-04-12 ENCOUNTER — Encounter: Payer: Self-pay | Admitting: Family Medicine

## 2022-04-17 ENCOUNTER — Encounter: Payer: Self-pay | Admitting: Physical Therapy

## 2022-04-17 ENCOUNTER — Ambulatory Visit (INDEPENDENT_AMBULATORY_CARE_PROVIDER_SITE_OTHER): Payer: Medicare Other | Admitting: Physical Therapy

## 2022-04-17 ENCOUNTER — Encounter: Payer: Medicare Other | Admitting: Physical Therapy

## 2022-04-17 DIAGNOSIS — M5459 Other low back pain: Secondary | ICD-10-CM | POA: Diagnosis not present

## 2022-04-17 DIAGNOSIS — M6281 Muscle weakness (generalized): Secondary | ICD-10-CM

## 2022-04-17 DIAGNOSIS — R262 Difficulty in walking, not elsewhere classified: Secondary | ICD-10-CM

## 2022-04-17 MED ORDER — TIZANIDINE HCL 4 MG PO TABS: 4.0000 mg | ORAL_TABLET | Freq: Three times a day (TID) | ORAL | 1 refills | Status: DC | PRN

## 2022-04-17 NOTE — Therapy (Signed)
OUTPATIENT PHYSICAL THERAPY TREATMENT NOTE and RECERT   Patient Name: Crystal Bean MRN: [id number] DOB:[birth year], 46 y.o., female Today's Date: 04/17/2022  PCP: Inda Coke, PA REFERRING PROVIDER: Inda Coke, PA  END OF SESSION:   PT End of Session - 04/17/22 1513     Visit Number 12    Number of Visits 14    Date for PT Re-Evaluation 05/02/22    Authorization Type medicare    Progress Note Due on Visit 20    PT Start Time 6644    PT Stop Time 1556    PT Time Calculation (min) 40 min    Activity Tolerance Patient tolerated treatment well             Past Medical History:  Diagnosis Date   ADHD (attention deficit hyperactivity disorder)    Anxiety    B12 deficiency    Back pain    Bipolar 1 disorder ([place])    Bulging lumbar disc    C. difficile diarrhea 04/24/2016   treated with metronidazole   Constipation    Depression    GERD (gastroesophageal reflux disease)    History of chicken pox    History of shingles 2013   Hypertension    IBS (irritable bowel syndrome)    Joint pain    Major depressive disorder    Migraines    Obesity    Obsessive-compulsive disorder    Osteoarthritis    Prediabetes    PTSD (post-traumatic stress disorder)    Sleep apnea    SOB (shortness of breath)    Past Surgical History:  Procedure Laterality Date   BLADDER SURGERY  1981   Urethera stretched   caps on teeth  1980   Caps on all Teeth   CESAREAN SECTION  2003   CORONARY STENT INTERVENTION N/A 12/28/2021   Procedure: CORONARY STENT INTERVENTION;  Surgeon: Martinique, Peter M, MD;  Location: [facility];  Service: Cardiovascular;  Laterality: N/A;   LEFT HEART CATH AND CORONARY ANGIOGRAPHY N/A 12/28/2021   Procedure: LEFT HEART CATH AND CORONARY ANGIOGRAPHY;  Surgeon: Martinique, Peter M, MD;  Location: [facility];  Service: Cardiovascular;  Laterality: N/A;   TONSILLECTOMY     WISDOM TOOTH EXTRACTION     Patient Active Problem List   Diagnosis Date  Noted   NSTEMI (non-ST elevated myocardial infarction) ([place]) 12/27/2021   Sore throat 11/08/2021   Mixed hyperlipidemia 06/05/2021   Vitamin D deficiency 06/05/2021   B12 deficiency 06/05/2021   Pre-diabetes 02/12/2021   Erythema nodosum 12/26/2020   Excessive daytime sleepiness 10/30/2020   Snoring 10/30/2020   Nocturia more than twice per night 10/30/2020   ADHD, adult residual type 10/30/2020   Retrognathia 10/30/2020   Obsessive compulsive disorder 09/08/2018   Bipolar 1 disorder ([place]) 08/26/2018   Obesity 04/08/2018   Essential hypertension 04/08/2018   PTSD (post-traumatic stress disorder)    Osteoarthritis    Migraines    GERD (gastroesophageal reflux disease)    Major depressive disorder, recurrent episode with mixed features ([place])    Bulging lumbar disc      PCP: Inda Coke, [place]   REFERRING PROVIDER: [name], MD   REFERRING DIAG:  [phone] (ICD-10-CM) - Acute pain of right knee M54.41,G89.29 (ICD-10-CM) - Chronic bilateral low back pain with right-sided sciatica   THERAPY DIAG:  Difficulty in walking, not elsewhere classified   Muscle weakness (generalized)   Other low back pain   Chronic pain of right knee  ONSET DATE: back pain has been years   SUBJECTIVE:                                                                                                                                                                                            SUBJECTIVE STATEMENT: 04/17/2022 States that she is feeling pretty good considering her has been up and doing a lot.    Eval:States that she has had back pain for years and has done PT 2x. States that she felt good after PT. States that she has no space between L4/5 and when it gets bad she has pain into her right leg. States when it is really bad it shoots down both sides. Standing is the worse. States she can't sweep the floor. States she usually pushes through the pain. States that biking hurts and she  likes to swim and she walks in the pool. States that she has a knot on her right and if you hit it it hurts really bad. States stairs are difficult and she cannot start with there right leg as it is painful.     PERTINENT HISTORY:  hx of MI- radial artery cardiac catherization in 12/28/21 Bipolar, depression    PAIN:  Are you having pain? no: NPRS scale: 3/10 Pain location: across low back Pain description tightness and stiffness Aggravating factors: standing > 15 minutes, prolonged sitting, bending Relieving factors: sitting, rest , swimming     PRECAUTIONS: None   WEIGHT BEARING RESTRICTIONS No   FALLS:  Has patient fallen in last 6 months? 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   PLOF: Independent   PATIENT GOALS be able to vacuum and mop without pain     OBJECTIVE:    DIAGNOSTIC FINDINGS:   R KNEE XRAY: IMPRESSION: No acute fracture or dislocation       SCREENING FOR RED FLAGS: Bowel or bladder incontinence: No Spinal tumors: No Cauda equina syndrome: No Compression fracture: No Abdominal aneurysm: No   COGNITION:           Overall cognitive status: Within functional limits for tasks assessed                          SENSATION: WFL   MUSCLE LENGTH: Hamstrings: Right 30 deg; Left 30 deg     POSTURE:  Limited lumbo pelvic motion, forward head and kyphotic posture   PALPATION: hypersensitivity over L4/5, tenderness to palpation along right lumbar paraspinals and glutes   LUMBAR ROM:    Active  A/PROM  04/04/22  Flexion 50%  limited  Extension 50% limited  Right lateral flexion 25% limited  Left lateral flexion 25% limited  Right rotation    Left rotation     (Blank rows = not tested)                       No lumbar/pelvic motion noted with any motions              LE Measurements       Lower Extremity Right 04/04/22 Left 04/04/22     A/PROM MMT A/PROM MMT  Hip Flexion 110 4+* 110 4+  Hip Extension   4   4+  Hip  Abduction          Hip Adduction          Hip Internal rotation 15   15    Hip External rotation 50   50    Knee Flexion   4+   4+  Knee Extension   4+*   4+  Ankle Dorsiflexion   5   5  Ankle Plantarflexion          Ankle Inversion          Ankle Eversion           (Blank rows = not tested)            * pain/discomfort            TODAY'S TREATMENT  04/17/2022 Therapeutic Exercise: Seated: flexion, SB L/R 2 minutes  Each Seated on ball: pelvic tilts ant/post 1 minute, bouncing 2 minutes, static balance x3 1 minutes each, LAQs 3x10, marching holds x3 1 minutes B  Standing: arm press downs into ball - tall posture 5" holds x3 1 minute bouts, stir the pot press downs on ball x2 30" bouts bilateral each  Previous Interventions: Supine with hips/knees 90/90 on ball LTR 3 minutes, hamstring iso into ball 3 minutes DKC on ball 3 minutes, piriformis stretch on ball x3 bilateral 1 minute each; with Posterior pelvic tilt and shoulder flexion 2 minutes S/l trunk rotation x2 1 minute each Bilateral  Standing: shoulder ER at wall 59mnutes, shoulder flexion facing wall x2 153mute bouts   Manual: traction to lumbar spine on ball 5 minutes       PATIENT EDUCATION:  Education details: on HEP  Person educated: Patient Education method: ExConsulting civil engineerDeMedia plannerand Handouts Education comprehension: verbalized understanding     HOME EXERCISE PROGRAM: [id number] ASSESSMENT:   CLINICAL IMPRESSION: 04/17/2022 Focused on seated exercises today. This was tolerated well. Slight pain in back with pelvic tilts but this reduced with repetition. No increased pain noted after exercises. Discussed adding new exercises to HEP. Will continue with current POC as tolerated.    Eval: Patient is a 89.o. female who was seen today for physical therapy evaluation and treatment for low back pain and right knee pain. Patient presents today with limitations in ROM and strength and limited lumbopelvic  movements that limit patient's overall function. Patient with positive response from PT previously for treatment of low back pain and would greatly benefit from skilled PT to improve overall function and quality of life.      OBJECTIVE IMPAIRMENTS decreased activity tolerance, decreased mobility, difficulty walking, decreased ROM, decreased strength, postural dysfunction, and pain.    ACTIVITY LIMITATIONS cleaning, meal prep, yard work, and shopping.    PERSONAL FACTORS Age and Time since onset of injury/illness/exacerbation are also affecting patient's functional outcome.  REHAB POTENTIAL: Good   CLINICAL DECISION MAKING: Stable/uncomplicated   EVALUATION COMPLEXITY: Low     GOALS: Goals reviewed with patient?  yes   SHORT TERM GOALS:   Patient will be independent in self management strategies to improve quality of life and functional outcomes. Baseline: new program Target date: 03/11/2022 Goal status: MET   2.  Patient will report at least 50% improvement in overall symptoms and/or function to demonstrate improved functional mobility Baseline: 0% Target date: 03/11/2022 Goal status: PROGRESSING   3.  Patient will be able to perform hip hinge motion with good form and occasional cues to improve lumbopelvic mobility Baseline: unable Target date: 03/11/2022 Goal status: PROGRESSING         LONG TERM GOALS:   Patient will report at least 75% improvement in overall symptoms and/or function to demonstrate improved functional mobility Baseline: 0% Target date: 04/01/2022 Goal status: PROGRESSING   2.  Patient will be able to vacuum/clean for at least 20 minutes without needing to sit and rest secondary to back pain to improve ability to perform house chores. Baseline: unable Target date: 04/01/2022 Goal status: PROGRESSING   3.  Patient will perform painfree MMT to demonstrate improved tolerance to muscle activation Baseline: painful Target date: 04/01/2022 Goal  status: MET   4.  Patient will be able to demonstrate pain free lumbar ROM Baseline: painful Target date: 04/01/2022 Goal status: MET       PLAN: PT FREQUENCY: 1x/week   PT DURATION: 4 weeks   PLANNED INTERVENTIONS: Therapeutic exercises, Therapeutic activity, Neuromuscular re-education, Balance training, Gait training, Patient/Family education, Joint mobilization, Aquatic Therapy, Dry Needling, Cryotherapy, Moist heat, and Manual therapy.   PLAN FOR NEXT SESSION: hip hinge, lumbopelvic mobility, pelvic tilts, glute activation bridges, traction   3:57 PM, 04/17/22 Jerene Pitch, DPT Physical Therapy with [facility]  [phone] office

## 2022-04-18 ENCOUNTER — Telehealth: Payer: Self-pay | Admitting: Physician Assistant

## 2022-04-18 ENCOUNTER — Other Ambulatory Visit: Payer: Self-pay

## 2022-04-18 ENCOUNTER — Encounter: Payer: Medicare Other | Admitting: Physical Therapy

## 2022-04-18 MED ORDER — AMPHETAMINE-DEXTROAMPHET ER 10 MG PO CP24: 10.0000 mg | ORAL_CAPSULE | Freq: Two times a day (BID) | ORAL | 0 refills | Status: DC

## 2022-04-18 NOTE — Telephone Encounter (Signed)
Patient lvm at 2:04 for refill on Adderall XR '10mg'$ . Ph: [phone] Appt 6/15. [facility]

## 2022-04-18 NOTE — Telephone Encounter (Signed)
Pended.

## 2022-05-01 ENCOUNTER — Encounter: Payer: Self-pay | Admitting: Physical Therapy

## 2022-05-01 ENCOUNTER — Ambulatory Visit: Payer: Medicare Other | Admitting: Physical Therapy

## 2022-05-01 DIAGNOSIS — M6281 Muscle weakness (generalized): Secondary | ICD-10-CM

## 2022-05-01 DIAGNOSIS — R293 Abnormal posture: Secondary | ICD-10-CM

## 2022-05-01 DIAGNOSIS — G8929 Other chronic pain: Secondary | ICD-10-CM

## 2022-05-01 NOTE — Therapy (Signed)
OUTPATIENT PHYSICAL THERAPY TREATMENT NOTE and RECERT   Patient Name: Crystal Bean MRN: [id number] DOB:09-28-1976, 46 y.o., female Today's Date: 05/01/2022  PCP: Inda Coke, PA REFERRING PROVIDER: Inda Coke, PA  END OF SESSION:   PT End of Session - 05/01/22 1302     Visit Number 13    Number of Visits 14    Date for PT Re-Evaluation 05/02/22    Authorization Type medicare    Progress Note Due on Visit 20    PT Start Time 7867    PT Stop Time 1342    PT Time Calculation (min) 40 min    Activity Tolerance Patient tolerated treatment well             Past Medical History:  Diagnosis Date   ADHD (attention deficit hyperactivity disorder)    Anxiety    B12 deficiency    Back pain    Bipolar 1 disorder ([place])    Bulging lumbar disc    C. difficile diarrhea 04/24/2016   treated with metronidazole   Constipation    Depression    GERD (gastroesophageal reflux disease)    History of chicken pox    History of shingles 2013   Hypertension    IBS (irritable bowel syndrome)    Joint pain    Major depressive disorder    Migraines    Obesity    Obsessive-compulsive disorder    Osteoarthritis    Prediabetes    PTSD (post-traumatic stress disorder)    Sleep apnea    SOB (shortness of breath)    Past Surgical History:  Procedure Laterality Date   BLADDER SURGERY  1981   Urethera stretched   caps on teeth  1980   Caps on all Teeth   CESAREAN SECTION  2003   CORONARY STENT INTERVENTION N/A 12/28/2021   Procedure: CORONARY STENT INTERVENTION;  Surgeon: Martinique, Peter M, MD;  Location: [facility];  Service: Cardiovascular;  Laterality: N/A;   LEFT HEART CATH AND CORONARY ANGIOGRAPHY N/A 12/28/2021   Procedure: LEFT HEART CATH AND CORONARY ANGIOGRAPHY;  Surgeon: Martinique, Peter M, MD;  Location: [facility];  Service: Cardiovascular;  Laterality: N/A;   TONSILLECTOMY     WISDOM TOOTH EXTRACTION     Patient Active Problem List   Diagnosis Date  Noted   NSTEMI (non-ST elevated myocardial infarction) ([place]) 12/27/2021   Sore throat 11/08/2021   Mixed hyperlipidemia 06/05/2021   Vitamin D deficiency 06/05/2021   B12 deficiency 06/05/2021   Pre-diabetes 02/12/2021   Erythema nodosum 12/26/2020   Excessive daytime sleepiness 10/30/2020   Snoring 10/30/2020   Nocturia more than twice per night 10/30/2020   ADHD, adult residual type 10/30/2020   Retrognathia 10/30/2020   Obsessive compulsive disorder 09/08/2018   Bipolar 1 disorder ([place]) 08/26/2018   Obesity 04/08/2018   Essential hypertension 04/08/2018   PTSD (post-traumatic stress disorder)    Osteoarthritis    Migraines    GERD (gastroesophageal reflux disease)    Major depressive disorder, recurrent episode with mixed features ([place])    Bulging lumbar disc      PCP: Inda Coke, [place]   REFERRING PROVIDER: [name], MD   REFERRING DIAG:  [phone] (ICD-10-CM) - Acute pain of right knee M54.41,G89.29 (ICD-10-CM) - Chronic bilateral low back pain with right-sided sciatica   THERAPY DIAG:  Difficulty in walking, not elsewhere classified   Muscle weakness (generalized)   Other low back pain   Chronic pain of right knee  ONSET DATE: back pain has been years   SUBJECTIVE:                                                                                                                                                                                            SUBJECTIVE STATEMENT: 05/01/2022 States that she rode in a car for 9.5 hours and on an airplane on Sunday. States that she sat in her recliner all day yesterday and she felt a little better but as soon as she move everything tenses up. States that overall she feels minimal long term progress in pain since the start of PT.   Eval:States that she has had back pain for years and has done PT 2x. States that she felt good after PT. States that she has no space between L4/5 and when it gets bad she has pain into her  right leg. States when it is really bad it shoots down both sides. Standing is the worse. States she can't sweep the floor. States she usually pushes through the pain. States that biking hurts and she likes to swim and she walks in the pool. States that she has a knot on her right and if you hit it it hurts really bad. States stairs are difficult and she cannot start with there right leg as it is painful.     PERTINENT HISTORY:  hx of MI- radial artery cardiac catherization in 12/28/21 Bipolar, depression    PAIN:  Are you having pain? no: NPRS scale: 4.5/10 Pain location: across low back Pain description tightness and stiffness Aggravating factors: standing > 15 minutes, prolonged sitting, bending Relieving factors: sitting, rest , swimming     PRECAUTIONS: None   WEIGHT BEARING RESTRICTIONS No   FALLS:  Has patient fallen in last 6 months? 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   PLOF: Independent   PATIENT GOALS be able to vacuum and mop without pain     OBJECTIVE:    DIAGNOSTIC FINDINGS:   R KNEE XRAY: IMPRESSION: No acute fracture or dislocation       SCREENING FOR RED FLAGS: Bowel or bladder incontinence: No Spinal tumors: No Cauda equina syndrome: No Compression fracture: No Abdominal aneurysm: No   COGNITION:           Overall cognitive status: Within functional limits for tasks assessed                          SENSATION: WFL   MUSCLE LENGTH: Hamstrings: Right 30 deg; Left 30 deg     POSTURE:  Limited lumbo pelvic motion, forward head and kyphotic posture   PALPATION: hypersensitivity over L4/5, tenderness to palpation along right lumbar paraspinals and glutes   LUMBAR ROM:    Active  A/PROM  04/04/22  Flexion 50% limited  Extension 50% limited  Right lateral flexion 25% limited  Left lateral flexion 25% limited  Right rotation    Left rotation     (Blank rows = not tested)                       No lumbar/pelvic  motion noted with any motions              LE Measurements       Lower Extremity Right 04/04/22 Left 04/04/22     A/PROM MMT A/PROM MMT  Hip Flexion 110 4+* 110 4+  Hip Extension   4   4+  Hip Abduction          Hip Adduction          Hip Internal rotation 15   15    Hip External rotation 50   50    Knee Flexion   4+   4+  Knee Extension   4+*   4+  Ankle Dorsiflexion   5   5  Ankle Plantarflexion          Ankle Inversion          Ankle Eversion           (Blank rows = not tested)            * pain/discomfort            TODAY'S TREATMENT  05/01/2022 Therapeutic Exercise: Supine: ball exercises LTR 3 minutes, DKC 3 minutes, hamstring iso 3 minutes, deadbugs 3 minutes, Seated: flexion, SB L/R 2 minutes  Each      PATIENT EDUCATION:  Education details: on HEP, on POC and following up with MD Person educated: Patient Education method: Consulting civil engineer, Media planner, and Handouts Education comprehension: verbalized understanding     HOME EXERCISE PROGRAM: [email or web address]   ASSESSMENT:   CLINICAL IMPRESSION: 05/01/2022 Patient overall feels better with stretches but continues to have pain at home. Minimal carryover with symptotic relief at home. Discussed current presentation, importance of continuing home program and to follow up with MD about continued pain to discuss next steps for her chronic low back pain.   Eval: Patient is a 46 y.o. female who was seen today for physical therapy evaluation and treatment for low back pain and right knee pain. Patient presents today with limitations in ROM and strength and limited lumbopelvic movements that limit patient's overall function. Patient with positive response from PT previously for treatment of low back pain and would greatly benefit from skilled PT to improve overall function and quality of life.      OBJECTIVE IMPAIRMENTS decreased activity tolerance, decreased mobility, difficulty walking, decreased ROM, decreased strength,  postural dysfunction, and pain.    ACTIVITY LIMITATIONS cleaning, meal prep, yard work, and shopping.    PERSONAL FACTORS Age and Time since onset of injury/illness/exacerbation are also affecting patient's functional outcome.      REHAB POTENTIAL: Good   CLINICAL DECISION MAKING: Stable/uncomplicated   EVALUATION COMPLEXITY: Low     GOALS: Goals reviewed with patient?  yes   SHORT TERM GOALS:   Patient will be independent in self management strategies to improve quality of life and functional outcomes. Baseline: new program Target date: 03/11/2022 Goal status: MET  2.  Patient will report at least 50% improvement in overall symptoms and/or function to demonstrate improved functional mobility Baseline: 0% Target date: 03/11/2022 Goal status: PROGRESSING   3.  Patient will be able to perform hip hinge motion with good form and occasional cues to improve lumbopelvic mobility Baseline: unable Target date: 03/11/2022 Goal status: PROGRESSING         LONG TERM GOALS:   Patient will report at least 75% improvement in overall symptoms and/or function to demonstrate improved functional mobility Baseline: 0% Target date: 04/01/2022 Goal status: PROGRESSING   2.  Patient will be able to vacuum/clean for at least 20 minutes without needing to sit and rest secondary to back pain to improve ability to perform house chores. Baseline: unable Target date: 04/01/2022 Goal status: PROGRESSING   3.  Patient will perform painfree MMT to demonstrate improved tolerance to muscle activation Baseline: painful Target date: 04/01/2022 Goal status: MET   4.  Patient will be able to demonstrate pain free lumbar ROM Baseline: painful Target date: 04/01/2022 Goal status: MET       PLAN: PT FREQUENCY: 1x/week   PT DURATION: 4 weeks   PLANNED INTERVENTIONS: Therapeutic exercises, Therapeutic activity, Neuromuscular re-education, Balance training, Gait training, Patient/Family education,  Joint mobilization, Aquatic Therapy, Dry Needling, Cryotherapy, Moist heat, and Manual therapy.   PLAN FOR NEXT SESSION: DC to HEP   1:43 PM, 05/01/22 Jerene Pitch, DPT Physical Therapy with [facility]  [phone] office

## 2022-05-02 ENCOUNTER — Encounter: Payer: Self-pay | Admitting: Physician Assistant

## 2022-05-02 ENCOUNTER — Ambulatory Visit (INDEPENDENT_AMBULATORY_CARE_PROVIDER_SITE_OTHER): Payer: Medicare Other | Admitting: Physician Assistant

## 2022-05-02 VITALS — BP 118/66 | HR 69

## 2022-05-02 DIAGNOSIS — F319 Bipolar disorder, unspecified: Secondary | ICD-10-CM | POA: Diagnosis not present

## 2022-05-02 DIAGNOSIS — F411 Generalized anxiety disorder: Secondary | ICD-10-CM

## 2022-05-02 DIAGNOSIS — F902 Attention-deficit hyperactivity disorder, combined type: Secondary | ICD-10-CM | POA: Diagnosis not present

## 2022-05-02 MED ORDER — AMPHETAMINE-DEXTROAMPHET ER 10 MG PO CP24: 10.0000 mg | ORAL_CAPSULE | Freq: Two times a day (BID) | ORAL | 0 refills | Status: DC

## 2022-05-02 NOTE — Progress Notes (Signed)
[facility]  Patient ID: Crystal Bean,  MRN: [id number]  PCP: Inda Coke, PA  Date of Evaluation: 05/02/2022 Time spent:30 minutes  Chief Complaint:  Chief Complaint   Depression; ADHD; Follow-up    HISTORY/CURRENT STATUS: HPI For routine med check.  Had an MI in Feb. Had a stent placed. Doing well as far as that goes. Sees [facility].   Patient denies loss of interest in usual activities and is able to enjoy things.  Denies decreased energy.  Denies decreased motivation. Got another dog. Has been going to concerts, enjoying that.  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  Things are going well with her and her husband.  ADLs and personal hygiene are normal.  Appetite has not changed.  She is losing some weight, seeing Dr. Juleen China at [facility].  No extreme sadness, tearfulness, or feelings of hopelessness.  Sleeps well most of the time.  Denies suicidal or homicidal thoughts.  States that attention isn't as good as it had been, since she had the heart attack.  She has been started on several new medications, including metoprolol though that could possibly make her more tired and not feel like doing things. Is able to do things she absolutely has to.  Doesn't really want to increase dose of Adderall though.  Dr. Juleen China had mentioned possibly changing to Vyvanse, but it still has not gone generic and it is too expensive for her right now.  I agree that would be a good choice once it is generic.  Patient denies increased energy with decreased need for sleep, no increased talkativeness, no racing thoughts, no impulsivity or risky behaviors, no increased spending, no increased libido, no grandiosity, no increased irritability or anger, and no hallucinations.  Denies dizziness, syncope, seizures, numbness, tingling, tremor, tics, unsteady gait, slurred speech, confusion. Denies muscle or joint pain, stiffness, or  dystonia. Denies unexplained weight loss, frequent infections, or sores that heal slowly.  No polyphagia, polydipsia, or polyuria. Denies visual changes or paresthesias.   Individual Medical History/ Review of Systems: Changes? :Yes   see HPI. Also seeing Dr. Georgina Snell for her back, has had PT, and Dr. Juleen China for weight loss.   Past medications for mental health diagnoses include: Prozac, Celexa, Lexapro, Wellbutrin, Effexor XR, Pristiq, Abilify,  Adderall, Ritalin, Klonopin, Xanax, Gabapentin, Buspar  Allergies: Prednisone, Meloxicam, and Penicillins  Current Medications:  Current Outpatient Medications:    ALPRAZolam (XANAX) 1 MG tablet, TAKE 1/2 TO 1 TABLET BY MOUTH TWO TIMES A DAY AS NEEDED FOR ANXIETY. Use sparingly., Disp: 60 tablet, Rfl: 0   ARIPiprazole (ABILIFY) 10 MG tablet, TAKE ONE TABLET BY MOUTH DAILY, Disp: 90 tablet, Rfl: 3   aspirin EC 81 MG EC tablet, Take 1 tablet (81 mg total) by mouth daily. Swallow whole., Disp: 30 tablet, Rfl: 11   buPROPion (WELLBUTRIN XL) 300 MG 24 hr tablet, Take 1 tablet (300 mg total) by mouth daily., Disp: 90 tablet, Rfl: 3   Cholecalciferol (VITAMIN D3) 10 MCG (400 UNIT) CAPS, Take by mouth., Disp: , Rfl:    clopidogrel (PLAVIX) 75 MG tablet, Take 1 tablet (75 mg total) by mouth daily. Take 4 tabs this pm for a loading dose, then 1 tab daily., Disp: 34 tablet, Rfl: 11   Cyanocobalamin (VITAMIN B-12 PO), Take by mouth., Disp: , Rfl:    esomeprazole (NEXIUM) 20 MG capsule, Take 20 mg by mouth daily at 12 noon., Disp: , Rfl:  gabapentin (NEURONTIN) 100 MG capsule, Take 1 capsule (100 mg total) by mouth 3 (three) times daily., Disp: 90 capsule, Rfl: 1   ketoconazole (NIZORAL) 2 % cream, APPLY TO AFFECTED AREA(S) ONCE TO TWICE DAILY, Disp: 30 g, Rfl: 0   lisinopril (ZESTRIL) 2.5 MG tablet, Take 1 tablet (2.5 mg total) by mouth daily., Disp: 30 tablet, Rfl: 6   metFORMIN (GLUCOPHAGE-XR) 500 MG 24 hr tablet, TAKE TWO TABLETS BY MOUTH DAILY WITH BREAKFAST,  Disp: 60 tablet, Rfl: 2   metoprolol succinate (TOPROL-XL) 25 MG 24 hr tablet, Take 1 tablet (25 mg total) by mouth daily., Disp: 30 tablet, Rfl: 6   Multiple Vitamin (MULTIVITAMIN) tablet, Take 1 tablet by mouth daily., Disp: , Rfl:    nitroGLYCERIN (NITROSTAT) 0.4 MG SL tablet, Place 1 tablet (0.4 mg total) under the tongue every 5 (five) minutes x 3 doses as needed for chest pain., Disp: 25 tablet, Rfl: 12   Omega-3 Fatty Acids (FISH OIL PO), Take by mouth., Disp: , Rfl:    rosuvastatin (CRESTOR) 40 MG tablet, Take 1 tablet (40 mg total) by mouth daily., Disp: 30 tablet, Rfl: 6   tiZANidine (ZANAFLEX) 4 MG tablet, Take 1 tablet (4 mg total) by mouth every 8 (eight) hours as needed for muscle spasms., Disp: 30 tablet, Rfl: 1   triamcinolone ointment (KENALOG) 0.5 %, Apply to affected area 1-2 times daily, Disp: 15 g, Rfl: 0   venlafaxine XR (EFFEXOR-XR) 150 MG 24 hr capsule, TAKE TWO CAPSULES BY MOUTH EVERY MORNING WITH BREAKFAST, Disp: 180 capsule, Rfl: 3   [START ON 05/17/2022] amphetamine-dextroamphetamine (ADDERALL XR) 10 MG 24 hr capsule, Take 1 capsule (10 mg total) by mouth 2 (two) times daily with breakfast and lunch., Disp: 60 capsule, Rfl: 0   cyclobenzaprine (FLEXERIL) 10 MG tablet, Take 0.5 - 1 tablet up to three times daily as needed for muscle spasm (Patient not taking: Reported on 05/02/2022), Disp: 30 tablet, Rfl: 0 Medication Side Effects: none  Family Medical/ Social History: Changes? No  MENTAL HEALTH EXAM:  Blood pressure 118/66, pulse 69.There is no height or weight on file to calculate BMI.  General Appearance: Casual, Well Groomed and Obese  Eye Contact:  Good  Speech:  Clear and Coherent and Normal Rate  Volume:  Normal  Mood:  Euthymic  Affect:  Congruent  Thought Process:  Goal Directed and Descriptions of Associations: Circumstantial  Orientation:  Full (Time, Place, and Person)  Thought Content: Logical   Suicidal Thoughts:  No  Homicidal Thoughts:  No   Memory:  WNL  Judgement:  Good  Insight:  Good  Psychomotor Activity:  Normal  Concentration:  Concentration: Good and Attention Span: Fair  Recall:  Good  Fund of Knowledge: Good  Language: Good  Assets:  Desire for Improvement  ADL's:  Intact  Cognition: WNL  Prognosis:  Good   Briefly reviewed notes and labs from hospitalization in February.  Most recent labs from 01/28/2022 CBC normal CMP normal  Lipids total cholesterol 84, triglycerides 68, HDL 39, LDL 30  DIAGNOSES:    ICD-10-CM   1. Attention deficit hyperactivity disorder (ADHD), combined type  F90.2     2. Generalized anxiety disorder  F41.1     3. Bipolar I disorder ([place])  F31.9       Receiving Psychotherapy: Yes  Leane Platt    RECOMMENDATIONS:  PDMP was reviewed.  Last Adderall filled 04/18/2022.  Xanax filled 11/01/2022.   I provided 30 minutes of face to face  time during this encounter, including time spent before and after the visit in records review, medical decision making, counseling pertinent to today's visit, and charting.  She is doing well as far as her mental health medications go so no changes will be made. We discussed the Adderall and the fact that it is not as helpful as it had been.  It is difficult to say if it has lost its efficacy or adding metoprolol that can cause fatigue is the problem.  Probably a combination of both.  At this time she does not want to increase the dose and I agree.  Her cardiology team does know she takes Adderall and has not recommended stopping it but if we can help it.  I agree with Dr. Juleen China about changing to Vyvanse, we will plan to do that once it becomes generic sometime this year.  Continue Xanax 1 mg, 1/2-1 po bid prn. Use sparingly. Continue Adderall XR 10 mg, 1 p.o. at breakfast and 1 p.o. at lunch. Continue Abilify 10 mg, 1 p.o. every morning. Continue Wellbutrin XL 300 mg, 1 p.o. daily. Continue gabapentin 100 mg, 1-3 daily as directed. Continue Effexor  XR 150 mg, 2 p.o. every morning. Continue therapy. Return in 3 months.  Donnal Moat, PA-C

## 2022-05-07 ENCOUNTER — Other Ambulatory Visit: Payer: Self-pay | Admitting: Physician Assistant

## 2022-05-07 NOTE — Telephone Encounter (Signed)
Pt requesting refill for Gabapentin, Last OV 02/08/2022.

## 2022-05-08 ENCOUNTER — Encounter: Payer: Medicare Other | Admitting: Physical Therapy

## 2022-05-09 ENCOUNTER — Ambulatory Visit: Payer: Medicare Other | Admitting: Physician Assistant
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
## 2022-05-16 ENCOUNTER — Ambulatory Visit (INDEPENDENT_AMBULATORY_CARE_PROVIDER_SITE_OTHER): Payer: Medicare Other | Admitting: Family Medicine

## 2022-05-16 VITALS — BP 118/76 | HR 73 | Ht 66.0 in | Wt 223.6 lb

## 2022-05-16 DIAGNOSIS — M5441 Lumbago with sciatica, right side: Secondary | ICD-10-CM

## 2022-05-16 DIAGNOSIS — G8929 Other chronic pain: Secondary | ICD-10-CM

## 2022-05-16 NOTE — Progress Notes (Signed)
I, Wendy Poet, LAT, ATC, am serving as scribe for Dr. [name].  Crystal Bean is a 46 y.o. female who presents to [place] at [facility] today for f/u of R-sided LBP w/ B leg pain.  She was last seen by Dr. Georgina Snell on 03/13/22 and noted 50-75% improvement w/ PT but con't to note her LBP as her CC.  She has now completed 13 PT visits.  She has been shown a HEP.  Today, pt reports benefit from the core strengthening, but LBP has not improved. Pt locates pain to both sides of the low back w/ varying radiating pain into either leg along the posterior aspect.  Diagnostic testing: R knee XR- 02/13/22; C-spine XR- 02/11/22; L-spine XR- 12/23/18  Pertinent review of systems: No fevers or chills  Relevant historical information: Drug-eluting coronary artery stent placed February 2023.  On Plavix.   Exam:  BP 118/76   Pulse 73   Ht '5\' 6"'$  (1.676 m)   Wt 223 lb 9.6 oz (101.4 kg)   SpO2 96%   BMI 36.09 kg/m  General: Well Developed, well nourished, and in no acute distress.   MSK: L-spine: Nontender midline.  Tender palpation right lumbar paraspinal musculature.  Decreased lumbar motion.    Lab and Radiology Results EXAM: LUMBAR SPINE - COMPLETE 4+ VIEW   COMPARISON:  05/22/2018.   FINDINGS: Paraspinal soft tissues are normal. Diffuse multilevel degenerative change. No acute bony abnormality identified. No evidence of fracture.   IMPRESSION: Diffuse multilevel degenerative change.  No acute abnormality.     Electronically Signed   By: Marcello Moores  Register   On: 12/24/2018 08:15 I, [name], personally (independently) visualized and performed the interpretation of the images attached in this note.      Assessment and Plan: 46 y.o. female with chronic right low back pain.  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  She has failed to improve significantly with physical therapy and at this point is a good candidate for facet injection planning.  Proceed to MRI  to evaluate the source of pain and for facet injection planning.  However she does have a drug-eluting stent that is now about 49 months old.  I am discussing with radiology about stopping Plavix.  -10 and is that the guidelines have changed recently and I do not think people need to stop Plavix for facet injections but I am not certain and would like to clarify this.   PDMP not reviewed this encounter. Orders Placed This Encounter  Procedures   MR LUMBAR SPINE WO CONTRAST    Potential facet injection planning    Standing Status:   Future    Standing Expiration Date:   06/15/2022    Order Specific Question:   What is the patient's sedation requirement?    Answer:   No Sedation    Order Specific Question:   Does the patient have a pacemaker or implanted devices?    Answer:   No    Order Specific Question:   Preferred imaging location?    Answer:   Product/process development scientist (table limit-350lbs)   No orders of the defined types were placed in this encounter.    Discussed warning signs or symptoms. Please see discharge instructions. Patient expresses understanding.   The above documentation has been reviewed and is accurate and complete [name], M.D.  Total encounter time 30 minutes including face-to-face time with the patient and, reviewing past medical record, and charting on the  date of service.   Treatment plan and options.

## 2022-05-16 NOTE — Patient Instructions (Addendum)
Thank you for coming in today.   You should hear from MRI scheduling within 1 week. If you do not hear please let me know.    Let's see what the MRI says and then make our treatment plan

## 2022-05-20 ENCOUNTER — Ambulatory Visit (INDEPENDENT_AMBULATORY_CARE_PROVIDER_SITE_OTHER): Payer: Medicare Other | Admitting: Cardiology

## 2022-05-20 ENCOUNTER — Encounter: Payer: Self-pay | Admitting: Cardiology

## 2022-05-20 VITALS — BP 106/64 | HR 74 | Ht 67.0 in | Wt 223.6 lb

## 2022-05-20 DIAGNOSIS — E782 Mixed hyperlipidemia: Secondary | ICD-10-CM | POA: Diagnosis not present

## 2022-05-20 DIAGNOSIS — I10 Essential (primary) hypertension: Secondary | ICD-10-CM | POA: Diagnosis not present

## 2022-05-20 DIAGNOSIS — I2510 Atherosclerotic heart disease of native coronary artery without angina pectoris: Secondary | ICD-10-CM

## 2022-05-20 NOTE — Patient Instructions (Signed)

## 2022-05-26 ENCOUNTER — Ambulatory Visit (INDEPENDENT_AMBULATORY_CARE_PROVIDER_SITE_OTHER): Payer: Medicare Other

## 2022-05-26 DIAGNOSIS — G8929 Other chronic pain: Secondary | ICD-10-CM | POA: Diagnosis not present

## 2022-05-26 DIAGNOSIS — M5441 Lumbago with sciatica, right side: Secondary | ICD-10-CM | POA: Diagnosis not present

## 2022-05-29 ENCOUNTER — Telehealth: Payer: Self-pay | Admitting: Family Medicine

## 2022-05-29 DIAGNOSIS — M47819 Spondylosis without myelopathy or radiculopathy, site unspecified: Secondary | ICD-10-CM

## 2022-05-29 DIAGNOSIS — M47816 Spondylosis without myelopathy or radiculopathy, lumbar region: Secondary | ICD-10-CM

## 2022-05-29 DIAGNOSIS — G8929 Other chronic pain: Secondary | ICD-10-CM

## 2022-05-29 NOTE — Progress Notes (Signed)
Lumbar spine MRI does show some potential pinched nerves but the dominant issue that we are worried about is facet arthritis in the low back.  It looks like L3-4 L4-5 and a little bit at L5-S1 could cause back pain.  Have asked the radiologist to inject right L4-L5 and L5-S1 which of the 2 lowermost facet joints.  Please call [facility] at [phone] to schedule the facet injections.  I did speak with the radiologist and you can stay on your Plavix.

## 2022-05-29 NOTE — Telephone Encounter (Signed)
Facet injections ordered. 

## 2022-05-30 ENCOUNTER — Inpatient Hospital Stay: Admission: RE | Admit: 2022-05-30 | Payer: Medicare Other | Source: Ambulatory Visit

## 2022-05-30 ENCOUNTER — Other Ambulatory Visit: Payer: Medicare Other

## 2022-06-03 ENCOUNTER — Inpatient Hospital Stay: Admission: RE | Admit: 2022-06-03 | Payer: Medicare Other | Source: Ambulatory Visit

## 2022-06-03 ENCOUNTER — Other Ambulatory Visit: Payer: Self-pay | Admitting: Physician Assistant

## 2022-06-05 ENCOUNTER — Telehealth: Payer: Self-pay | Admitting: Physician Assistant

## 2022-06-05 ENCOUNTER — Other Ambulatory Visit: Payer: Self-pay

## 2022-06-05 MED ORDER — AMPHETAMINE-DEXTROAMPHET ER 10 MG PO CP24
10.0000 mg | ORAL_CAPSULE | Freq: Two times a day (BID) | ORAL | 0 refills | Status: DC
Start: 2022-06-05 — End: 2022-07-08

## 2022-06-05 NOTE — Telephone Encounter (Signed)
Pended.

## 2022-06-05 NOTE — Telephone Encounter (Signed)
Pt lvm that she needs a refill on her adderall xr 10 mg. Pharmacy [facility] on [address] has it in stock

## 2022-06-12 ENCOUNTER — Ambulatory Visit
Admission: RE | Admit: 2022-06-12 | Discharge: 2022-06-12 | Disposition: A | Discharge status: Home / Self Care | Payer: Medicare Other | Source: Ambulatory Visit | Attending: Family Medicine | Admitting: Family Medicine

## 2022-06-12 DIAGNOSIS — M47816 Spondylosis without myelopathy or radiculopathy, lumbar region: Secondary | ICD-10-CM

## 2022-06-12 DIAGNOSIS — G8929 Other chronic pain: Secondary | ICD-10-CM

## 2022-06-12 DIAGNOSIS — M47819 Spondylosis without myelopathy or radiculopathy, site unspecified: Secondary | ICD-10-CM

## 2022-06-12 MED ORDER — IOPAMIDOL (ISOVUE-M 200) INJECTION 41%
1.0000 mL | Freq: Once | INTRAMUSCULAR | Status: AC
  Administered 2022-06-12: 1 mL via INTRA_ARTICULAR

## 2022-06-12 MED ORDER — METHYLPREDNISOLONE ACETATE 40 MG/ML INJ SUSP (RADIOLOG
80.0000 mg | Freq: Once | INTRAMUSCULAR | Status: AC
  Administered 2022-06-12: 80 mg via INTRA_ARTICULAR

## 2022-06-12 NOTE — Discharge Instructions (Signed)

## 2022-06-14 IMAGING — MG DIGITAL SCREENING BILAT W/ CAD
4 series · 4 of 4 positions shown · non-contrast
Comparison: Previous exam(s).

CLINICAL DATA: Screening.

EXAM:
DIGITAL SCREENING BILATERAL MAMMOGRAM WITH CAD
TECHNIQUE: Bilateral screening digital craniocaudal and mediolateral oblique
mammograms were obtained. The images were evaluated with
computer-aided detection.

[L MLO]
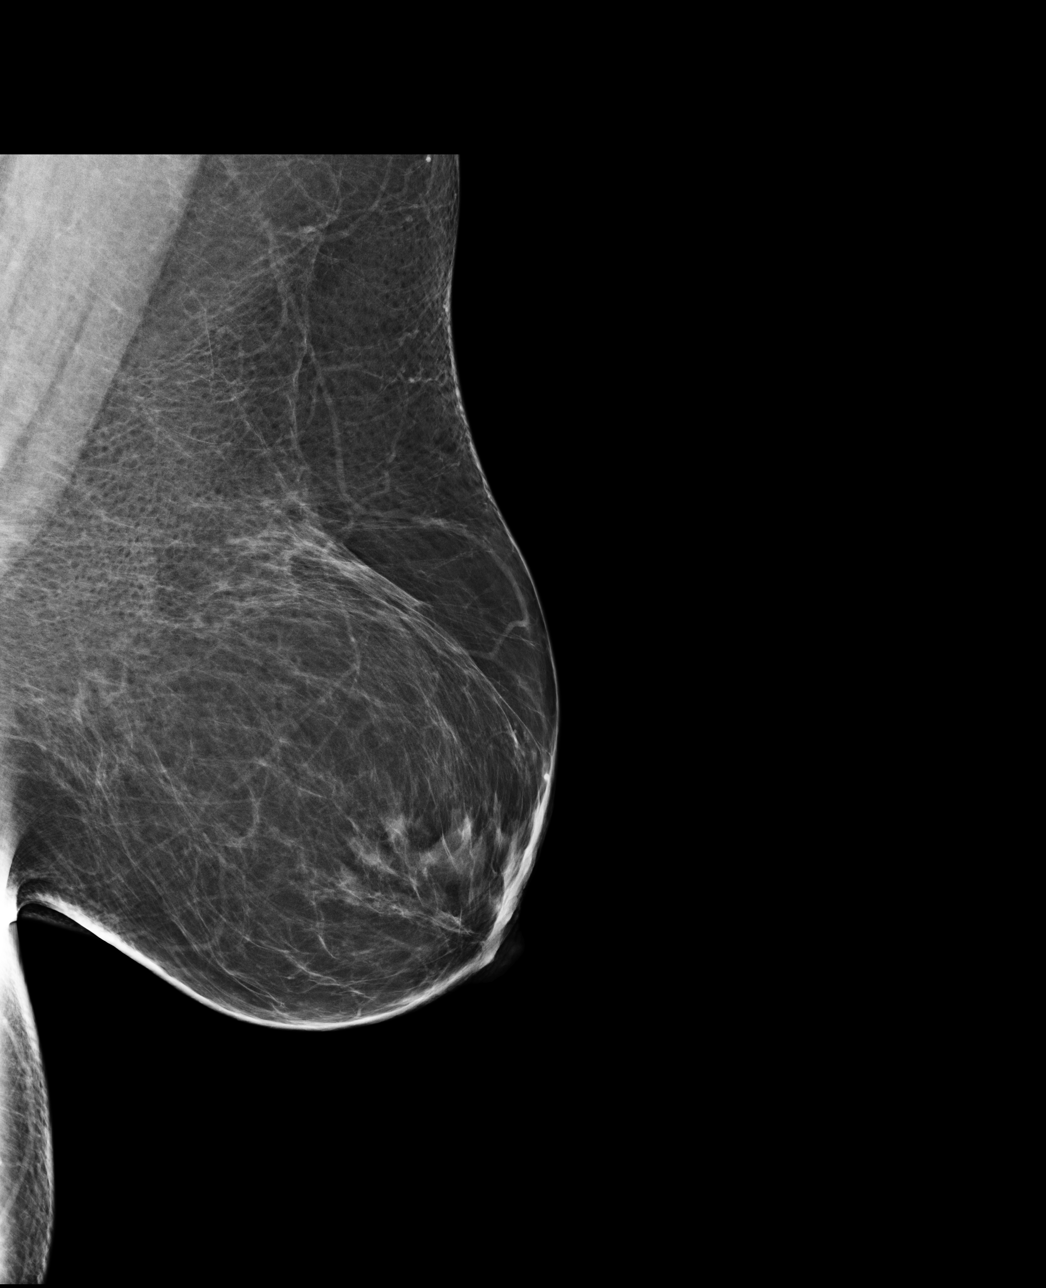

[L CC]
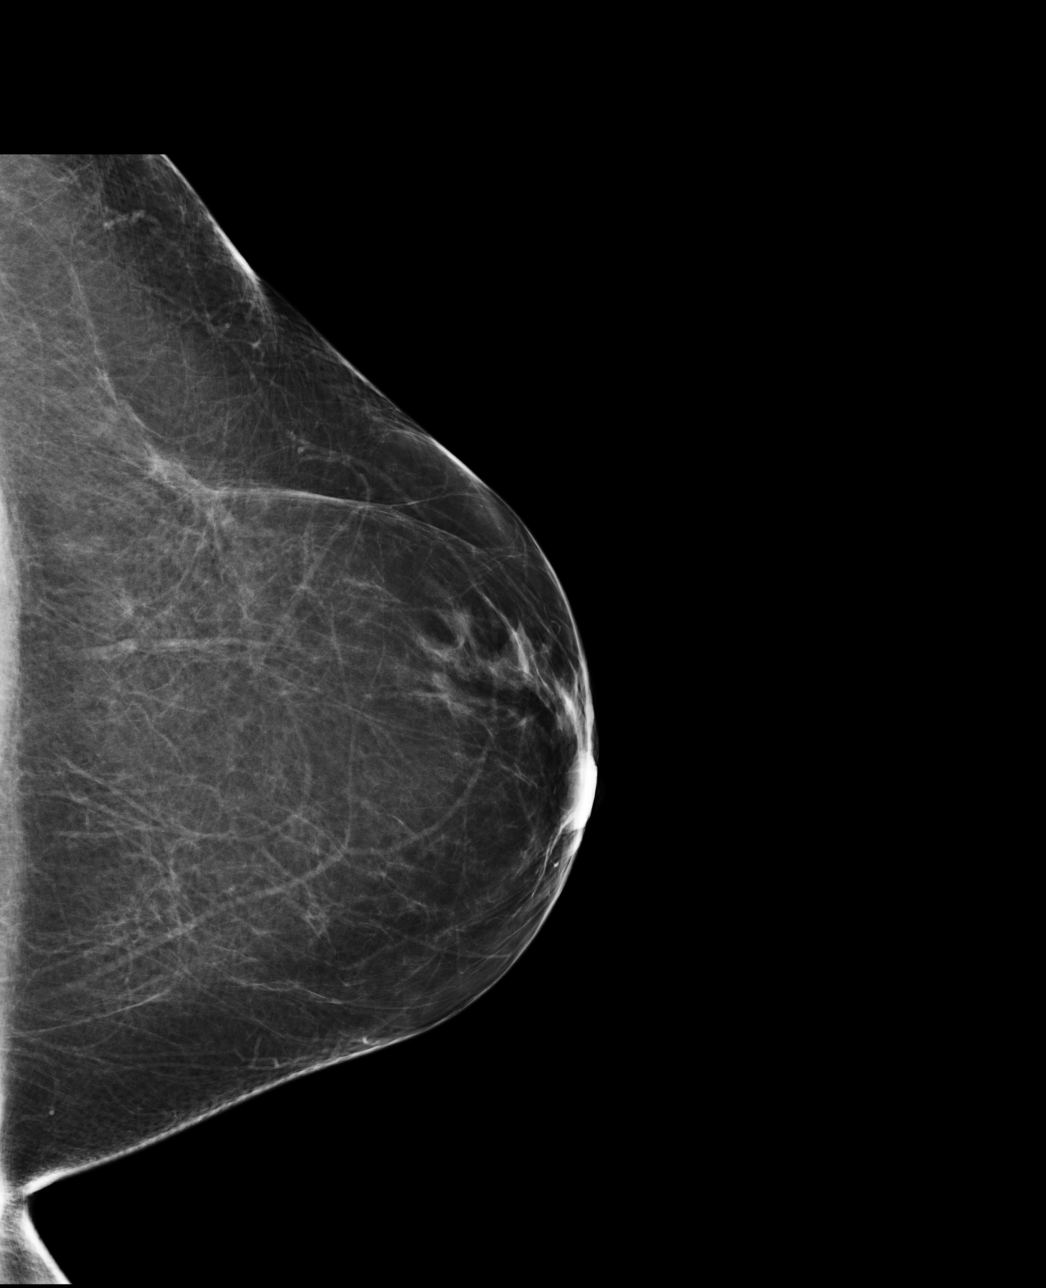

[R CC]
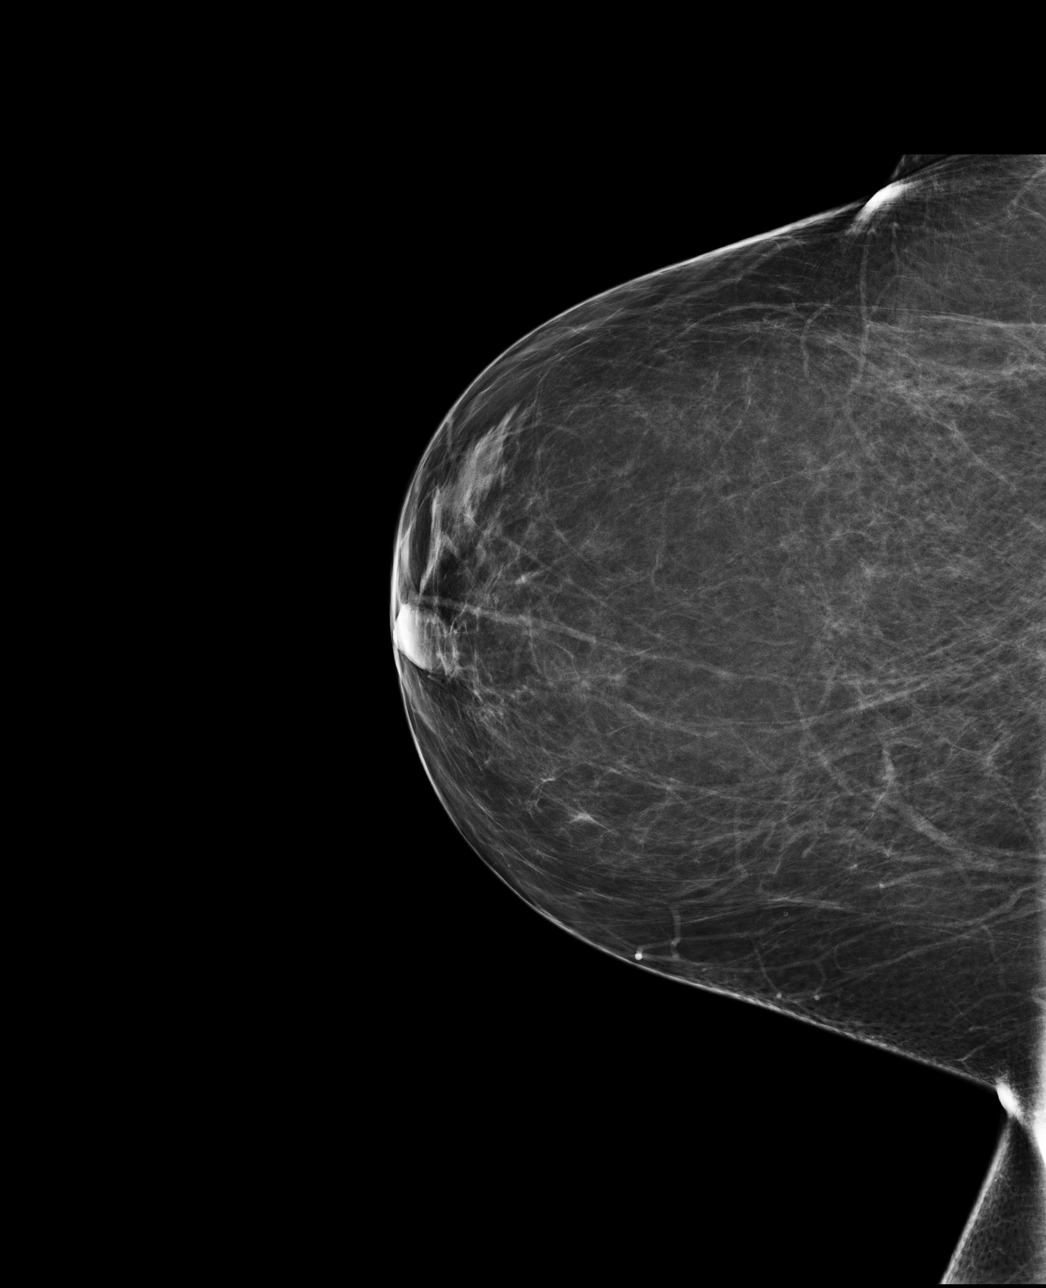

[R MLO]
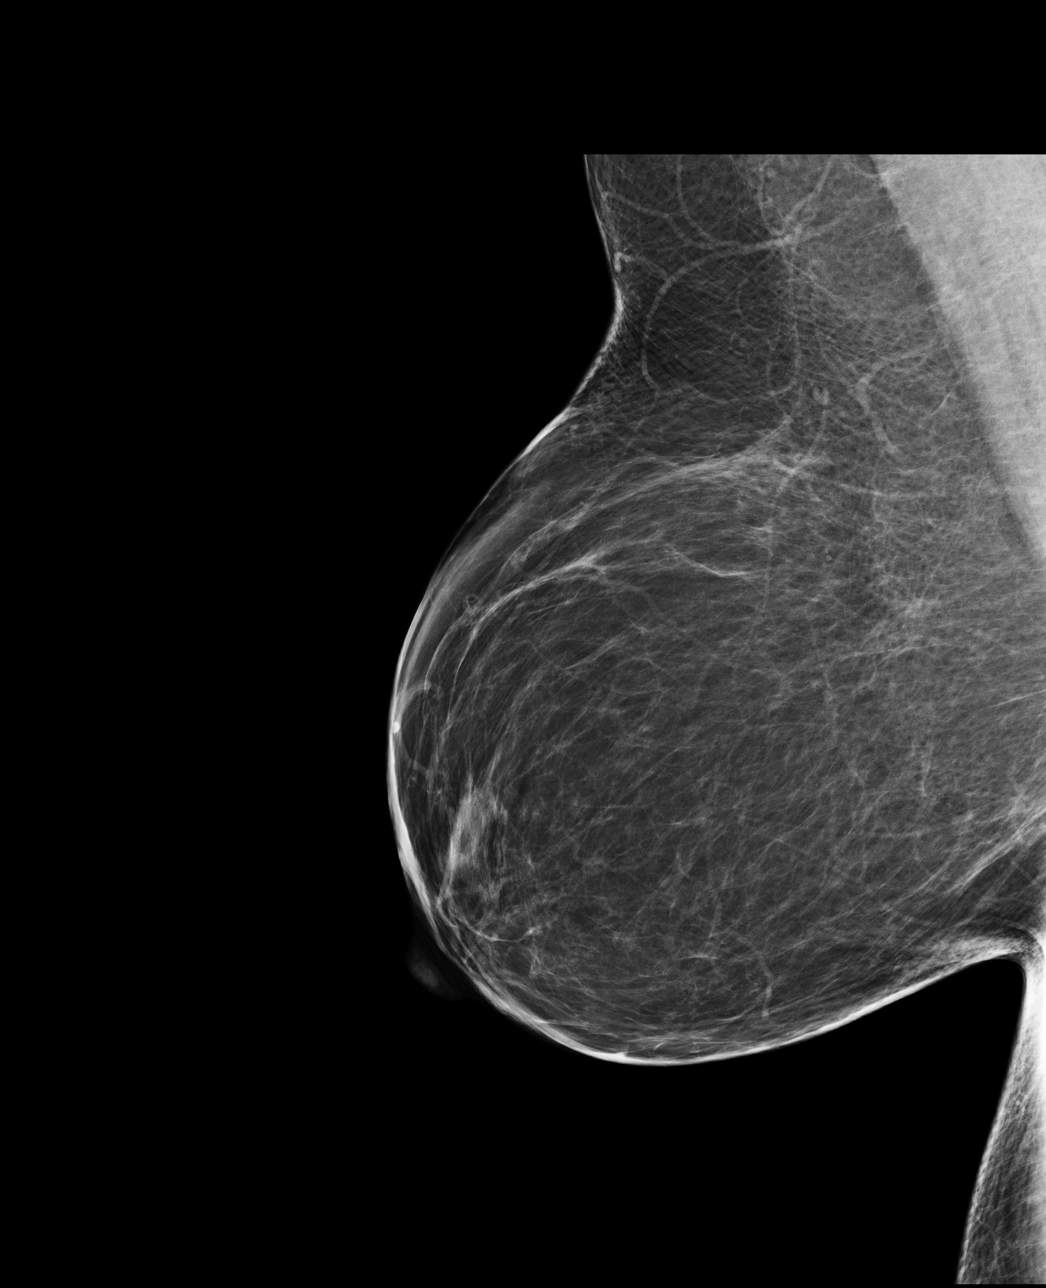

[4 of 4 positions shown; findings below may reference images not displayed]

ACR Breast Density Category b: There are scattered areas of
fibroglandular density.
FINDINGS: There are no findings suspicious for malignancy. The images were
evaluated with computer-aided detection.
IMPRESSION: No mammographic evidence of malignancy. A result letter of this
screening mammogram will be mailed directly to the patient.

RECOMMENDATION:
Screening mammogram in one year. (Code:XC-Q-XW6)

BI-RADS CATEGORY  1: Negative.

## 2022-06-26 ENCOUNTER — Encounter (INDEPENDENT_AMBULATORY_CARE_PROVIDER_SITE_OTHER): Payer: Self-pay

## 2022-07-04 ENCOUNTER — Other Ambulatory Visit: Payer: Self-pay | Admitting: Physician Assistant

## 2022-07-04 NOTE — Telephone Encounter (Signed)
Pt requesting refill for Gabapentin, scheduled to see you 07/09/2022.

## 2022-07-05 NOTE — Progress Notes (Unsigned)
   I, Peterson Lombard, LAT, ATC acting as a scribe for Crystal Leader, MD.  Crystal Bean is a 46 y.o. female who presents to [place] at [facility] today for f/u cont'd chronic bilat LBP. Pt was last seen by Dr. Georgina Snell on 05/16/22 and she was advised to proceed to MRI. Based on MRI results, facet injections were order, and later performed on 06/12/22. Today, pt reports  Dx imaging: 05/26/22 L-spine MRI  12/23/18 L-spine XR  Pertinent review of systems: ***  Relevant historical information: ***   Exam:  There were no vitals taken for this visit. General: Well Developed, well nourished, and in no acute distress.   MSK: ***    Lab and Radiology Results No results found for this or any previous visit (from the past 72 hour(s)). No results found.     Assessment and Plan: 46 y.o. female with ***   PDMP not reviewed this encounter. No orders of the defined types were placed in this encounter.  No orders of the defined types were placed in this encounter.    Discussed warning signs or symptoms. Please see discharge instructions. Patient expresses understanding.   ***

## 2022-07-06 ENCOUNTER — Other Ambulatory Visit: Payer: Self-pay | Admitting: Physician Assistant

## 2022-07-07 ENCOUNTER — Telehealth: Payer: Medicare Other | Admitting: Family

## 2022-07-07 DIAGNOSIS — N898 Other specified noninflammatory disorders of vagina: Secondary | ICD-10-CM

## 2022-07-07 DIAGNOSIS — R300 Dysuria: Secondary | ICD-10-CM

## 2022-07-07 NOTE — Progress Notes (Signed)
Because you are having UTI symptoms and vaginal discharge,you need to be seen in person for further testing.   I recommend that you be seen in a face to face visit.   NOTE: There will be NO CHARGE for this eVisit   If you are having a true medical emergency please call 911.      For an urgent face to face visit, [facility] has seven urgent care centers for your convenience:     [facility] [place] [facility] [place] Get Driving Directions [phone] [address] [place], [place] [address]    [facility]) Get Driving Directions [phone] [place], [place] [address]  [facility] [place] [facility]) Get Driving Directions [phone] [address] [place],  [place]  [address]  [facility] [place] [facility] [address] Get Driving Directions  [phone] [address] [place],  [place] [address]   [facility] Get Driving Directions [phone] [address] [place], [place], [place] [address]   [facility] Get Driving Directions  [phone] [address], [place] [address]   [facility] [place] Get Driving Directions [phone] [address], [place], [place] [address]  Your MyChart E-visit questionnaire answers were reviewed by a board certified advanced clinical practitioner to complete your personal care plan based on your specific symptoms.  Thank you for using e-Visits.

## 2022-07-08 ENCOUNTER — Ambulatory Visit (INDEPENDENT_AMBULATORY_CARE_PROVIDER_SITE_OTHER): Payer: Medicare Other | Admitting: Family Medicine

## 2022-07-08 ENCOUNTER — Telehealth: Payer: Self-pay | Admitting: Physician Assistant

## 2022-07-08 ENCOUNTER — Other Ambulatory Visit: Payer: Self-pay

## 2022-07-08 VITALS — BP 118/80 | HR 72 | Ht 67.0 in | Wt 223.0 lb

## 2022-07-08 DIAGNOSIS — M47816 Spondylosis without myelopathy or radiculopathy, lumbar region: Secondary | ICD-10-CM | POA: Diagnosis not present

## 2022-07-08 DIAGNOSIS — M5441 Lumbago with sciatica, right side: Secondary | ICD-10-CM

## 2022-07-08 DIAGNOSIS — I2510 Atherosclerotic heart disease of native coronary artery without angina pectoris: Secondary | ICD-10-CM

## 2022-07-08 DIAGNOSIS — G8929 Other chronic pain: Secondary | ICD-10-CM | POA: Diagnosis not present

## 2022-07-08 MED ORDER — NORTRIPTYLINE HCL 25 MG PO CAPS: 25.0000 mg | ORAL_CAPSULE | Freq: Every day | ORAL | 2 refills | Status: DC

## 2022-07-08 MED ORDER — AMPHETAMINE-DEXTROAMPHET ER 10 MG PO CP24
10.0000 mg | ORAL_CAPSULE | Freq: Two times a day (BID) | ORAL | 0 refills | Status: DC
Start: 2022-07-08 — End: 2022-08-07

## 2022-07-08 NOTE — Telephone Encounter (Signed)
Next visit is 08/05/22. Crystal Bean is requesting a refill on her Adderall 15 mg called to:  [phone] - [place] [address], [place]  Phone:  [phone]  Fax:  [phone]    Phylis checked and they do have this dosage in stock at above pharmacy.

## 2022-07-08 NOTE — Telephone Encounter (Signed)
Pended.

## 2022-07-08 NOTE — Patient Instructions (Addendum)
Thank you for coming in today.   Glad you are feeling better!  Try to take it easy and not over do it! Good luck with the move!  Check back as needed  Start nortriptyline for general pain management.   The Pain Management Workbook: Powerful CBT and Mindfulness Skills to Take Control of Pain and Reclaim Your Life Dr Charlaine Dalton MS PhD.

## 2022-07-09 ENCOUNTER — Encounter: Payer: Self-pay | Admitting: Physician Assistant

## 2022-07-09 ENCOUNTER — Ambulatory Visit (INDEPENDENT_AMBULATORY_CARE_PROVIDER_SITE_OTHER): Payer: Medicare Other | Admitting: Physician Assistant

## 2022-07-09 ENCOUNTER — Other Ambulatory Visit: Payer: Self-pay | Admitting: Physician Assistant

## 2022-07-09 VITALS — BP 90/60 | HR 68 | Temp 97.9°F | Ht 67.0 in | Wt 222.2 lb

## 2022-07-09 DIAGNOSIS — R102 Pelvic and perineal pain: Secondary | ICD-10-CM

## 2022-07-09 DIAGNOSIS — Z23 Encounter for immunization: Secondary | ICD-10-CM | POA: Diagnosis not present

## 2022-07-09 DIAGNOSIS — R7303 Prediabetes: Secondary | ICD-10-CM

## 2022-07-09 DIAGNOSIS — I10 Essential (primary) hypertension: Secondary | ICD-10-CM

## 2022-07-09 LAB — CBC WITH DIFFERENTIAL/PLATELET
Basophils Absolute: 0.1 10*3/uL (ref 0.0–0.1)
Basophils Relative: 0.8 % (ref 0.0–3.0)
Eosinophils Absolute: 0.2 10*3/uL (ref 0.0–0.7)
Eosinophils Relative: 1.7 % (ref 0.0–5.0)
HCT: 37.2 % (ref 36.0–46.0)
Hemoglobin: 12.1 g/dL (ref 12.0–15.0)
Lymphocytes Relative: 19.8 % (ref 12.0–46.0)
Lymphs Abs: 2.3 10*3/uL (ref 0.7–4.0)
MCHC: 32.5 g/dL (ref 30.0–36.0)
MCV: 87.3 fl (ref 78.0–100.0)
Monocytes Absolute: 0.9 10*3/uL (ref 0.1–1.0)
Monocytes Relative: 7.6 % (ref 3.0–12.0)
Neutro Abs: 8.3 10*3/uL — ABNORMAL HIGH (ref 1.4–7.7)
Neutrophils Relative %: 70.1 % (ref 43.0–77.0)
Platelets: 372 10*3/uL (ref 150.0–400.0)
RBC: 4.26 Mil/uL (ref 3.87–5.11)
RDW: 15.1 % (ref 11.5–15.5)
WBC: 11.8 10*3/uL — ABNORMAL HIGH (ref 4.0–10.5)

## 2022-07-09 LAB — COMPREHENSIVE METABOLIC PANEL
ALT: 16 U/L (ref 0–35)
AST: 15 U/L (ref 0–37)
Albumin: 4.1 g/dL (ref 3.5–5.2)
Alkaline Phosphatase: 69 U/L (ref 39–117)
BUN: 10 mg/dL (ref 6–23)
CO2: 28 mEq/L (ref 19–32)
Calcium: 9.2 mg/dL (ref 8.4–10.5)
Chloride: 101 mEq/L (ref 96–112)
Creatinine, Ser: 0.98 mg/dL (ref 0.40–1.20)
GFR: 69.11 mL/min (ref >60.00)
Glucose, Bld: 87 mg/dL (ref 70–99)
Potassium: 4.6 mEq/L (ref 3.5–5.1)
Sodium: 136 mEq/L (ref 135–145)
Total Bilirubin: 0.4 mg/dL (ref 0.2–1.2)
Total Protein: 7.4 g/dL (ref 6.0–8.3)

## 2022-07-09 LAB — URINALYSIS, ROUTINE W REFLEX MICROSCOPIC
Bilirubin Urine: NEGATIVE
Ketones, ur: NEGATIVE
Nitrite: NEGATIVE
Specific Gravity, Urine: 1.01 (ref 1.000–1.030)
Total Protein, Urine: NEGATIVE
Urine Glucose: NEGATIVE
Urobilinogen, UA: 0.2 (ref 0.0–1.0)
pH: 7 (ref 5.0–8.0)

## 2022-07-09 LAB — HEMOGLOBIN A1C: Hgb A1c MFr Bld: 6 % (ref 4.6–6.5)

## 2022-07-09 MED ORDER — CEPHALEXIN 500 MG PO CAPS: 500.0000 mg | ORAL_CAPSULE | Freq: Two times a day (BID) | ORAL | 0 refills | Status: DC

## 2022-07-09 NOTE — Patient Instructions (Signed)
It was great to see you!  I think you are doing great!  If the pelvic pain/urine symptoms return -- let me know and we will get some imaging.  Let's follow-up in 6 months, sooner if you have concerns.  Take care,  Inda Coke PA-C

## 2022-07-09 NOTE — Progress Notes (Signed)
Crystal Bean is a 46 y.o. female here for a follow up of a pre-existing problem.  History of Present Illness:   Chief Complaint  Patient presents with   Back Pain    Pt had back injection 7/23 for low back pain. She is feeling better.    HPI  Pelvic pain Sunday during the day, she had pain and malodorous urine. It didn't hurt when she peed but it was difficult getting urine out. She was in tears due to the pain. She had symptoms for about 12 hours. Small amount of blood in tissue. No prior history of kidney stones. Felt pelvic heaviness. Symptoms are completely gone. Still has regular periods. Denies: fevers, chills, n/v/d.  Insulin resistance She is currently on metformin 500 mg XR daily. She is unable to do GLP-1's due to insurance.  HTN Currently taking lisinopril 2.5 mg daily, metoprolol 25 mg daily. At home blood pressure readings are: WNL. Patient denies chest pain, SOB, blurred vision, dizziness, unusual headaches, lower leg swelling. Patient is  compliant with medication. Denies excessive caffeine intake, stimulant usage, excessive alcohol intake, or increase in salt consumption.  BP Readings from Last 3 Encounters:  07/09/22 90/60  07/08/22 118/80  06/12/22 108/70      Past Medical History:  Diagnosis Date   ADHD (attention deficit hyperactivity disorder)    Anxiety    B12 deficiency    Back pain    Bipolar 1 disorder (HCC)    Bulging lumbar disc    C. difficile diarrhea 04/24/2016   treated with metronidazole   Constipation    Depression    GERD (gastroesophageal reflux disease)    History of chicken pox    History of shingles 2013   Hypertension    IBS (irritable bowel syndrome)    Joint pain    Major depressive disorder    Migraines    Obesity    Obsessive-compulsive disorder    Osteoarthritis    Prediabetes    PTSD (post-traumatic stress disorder)    Sleep apnea    SOB (shortness of breath)      Social History   Tobacco Use   Smoking status:  Former    Packs/day: 1.00    Years: 20.00    Total pack years: 20.00    Types: Cigarettes    Quit date: 06/19/2011    Years since quitting: 11.0   Smokeless tobacco: Never  Vaping Use   Vaping Use: Never used  Substance Use Topics   Alcohol use: Not Currently   Drug use: No    Past Surgical History:  Procedure Laterality Date   BLADDER SURGERY  1981   Urethera stretched   caps on teeth  1980   Caps on all Teeth   CESAREAN SECTION  2003   CORONARY STENT INTERVENTION N/A 12/28/2021   Procedure: CORONARY STENT INTERVENTION;  Surgeon: Martinique, Peter M, MD;  Location: [facility];  Service: Cardiovascular;  Laterality: N/A;   LEFT HEART CATH AND CORONARY ANGIOGRAPHY N/A 12/28/2021   Procedure: LEFT HEART CATH AND CORONARY ANGIOGRAPHY;  Surgeon: Martinique, Peter M, MD;  Location: [facility];  Service: Cardiovascular;  Laterality: N/A;   TONSILLECTOMY     WISDOM TOOTH EXTRACTION      Family History  Problem Relation Age of Onset   Osteoporosis Mother    Depression Mother    Obesity Mother    Hearing loss Father    Obesity Father    Healthy Sister  CVA Maternal Grandmother    Heart disease Maternal Grandmother    Arthritis Paternal Grandmother    Hearing loss Paternal Grandmother    Dementia Paternal Grandmother    Dementia Paternal Grandfather    Eczema Daughter     Allergies  Allergen Reactions   Prednisone Other (See Comments)    Severe mood swings   Meloxicam Rash   Penicillins Rash    Current Medications:   Current Outpatient Medications:    ALPRAZolam (XANAX) 1 MG tablet, TAKE 1/2 TO 1 TABLET BY MOUTH TWO TIMES A DAY AS NEEDED FOR ANXIETY. Use sparingly., Disp: 60 tablet, Rfl: 0   amphetamine-dextroamphetamine (ADDERALL XR) 10 MG 24 hr capsule, Take 1 capsule (10 mg total) by mouth 2 (two) times daily with breakfast and lunch., Disp: 60 capsule, Rfl: 0   ARIPiprazole (ABILIFY) 10 MG tablet, TAKE ONE TABLET BY MOUTH DAILY, Disp: 90 tablet, Rfl: 3    aspirin EC 81 MG EC tablet, Take 1 tablet (81 mg total) by mouth daily. Swallow whole., Disp: 30 tablet, Rfl: 11   buPROPion (WELLBUTRIN XL) 300 MG 24 hr tablet, Take 1 tablet (300 mg total) by mouth daily., Disp: 90 tablet, Rfl: 3   Cholecalciferol (VITAMIN D3) 10 MCG (400 UNIT) CAPS, Take by mouth., Disp: , Rfl:    clopidogrel (PLAVIX) 75 MG tablet, Take 1 tablet (75 mg total) by mouth daily. Take 4 tabs this pm for a loading dose, then 1 tab daily., Disp: 34 tablet, Rfl: 11   Cyanocobalamin (VITAMIN B-12 PO), Take by mouth., Disp: , Rfl:    esomeprazole (NEXIUM) 20 MG capsule, Take 20 mg by mouth daily at 12 noon., Disp: , Rfl:    gabapentin (NEURONTIN) 100 MG capsule, TAKE ONE CAPSULE BY MOUTH THREE TIMES A DAY, Disp: 90 capsule, Rfl: 1   ketoconazole (NIZORAL) 2 % cream, APPLY TO AFFECTED AREA(S) ONCE TO TWICE DAILY, Disp: 30 g, Rfl: 0   lisinopril (ZESTRIL) 2.5 MG tablet, Take 1 tablet (2.5 mg total) by mouth daily., Disp: 30 tablet, Rfl: 6   metFORMIN (GLUCOPHAGE-XR) 500 MG 24 hr tablet, TAKE TWO TABLETS BY MOUTH DAILY WITH BREAKFAST, Disp: 60 tablet, Rfl: 2   metoprolol succinate (TOPROL-XL) 25 MG 24 hr tablet, Take 1 tablet by mouth once daily, Disp: 90 tablet, Rfl: 3   Multiple Vitamin (MULTIVITAMIN) tablet, Take 1 tablet by mouth daily., Disp: , Rfl:    nitroGLYCERIN (NITROSTAT) 0.4 MG SL tablet, Place 1 tablet (0.4 mg total) under the tongue every 5 (five) minutes x 3 doses as needed for chest pain., Disp: 25 tablet, Rfl: 12   nortriptyline (PAMELOR) 25 MG capsule, Take 1 capsule (25 mg total) by mouth at bedtime., Disp: 30 capsule, Rfl: 2   Omega-3 Fatty Acids (FISH OIL PO), Take by mouth., Disp: , Rfl:    rosuvastatin (CRESTOR) 40 MG tablet, Take 1 tablet (40 mg total) by mouth daily., Disp: 30 tablet, Rfl: 6   triamcinolone ointment (KENALOG) 0.5 %, Apply to affected area 1-2 times daily, Disp: 15 g, Rfl: 0   venlafaxine XR (EFFEXOR-XR) 150 MG 24 hr capsule, TAKE TWO CAPSULES BY MOUTH  EVERY MORNING WITH BREAKFAST, Disp: 180 capsule, Rfl: 3   Review of Systems:   ROS Negative unless otherwise specified per HPI.  Vitals:   Vitals:   07/09/22 0802  BP: 90/60  Pulse: 68  Temp: 97.9 F (36.6 C)  TempSrc: Temporal  SpO2: 96%  Weight: 222 lb 4 oz (100.8 kg)  Height: 5'  7" (1.702 m)     Body mass index is 34.81 kg/m.  Physical Exam:   Physical Exam Vitals and nursing note reviewed.  Constitutional:      General: She is not in acute distress.    Appearance: She is well-developed. She is not ill-appearing or toxic-appearing.  Cardiovascular:     Rate and Rhythm: Normal rate and regular rhythm.     Pulses: Normal pulses.     Heart sounds: Normal heart sounds, S1 normal and S2 normal.  Pulmonary:     Effort: Pulmonary effort is normal.     Breath sounds: Normal breath sounds.  Abdominal:     General: Abdomen is flat. Bowel sounds are normal.     Palpations: Abdomen is soft.     Tenderness: There is no abdominal tenderness. There is no right CVA tenderness or left CVA tenderness.  Skin:    General: Skin is warm and dry.  Neurological:     Mental Status: She is alert.     GCS: GCS eye subscore is 4. GCS verbal subscore is 5. GCS motor subscore is 6.  Psychiatric:        Speech: Speech normal.        Behavior: Behavior normal. Behavior is cooperative.     Assessment and Plan:   Pelvic pain Unclear etiology Symptoms resolved Will check a UA and basic blood work If returns, will likely pursue imaging  Prediabetes Update A1c and adjust metformin 500 mg xr daily as indicated  Essential hypertension Normotensive Continue isinopril 2.5 mg daily, metoprolol 25 mg daily  Follow-up in 6 months, sooner if concerns   Inda Coke, PA-C

## 2022-07-22 ENCOUNTER — Other Ambulatory Visit: Payer: Self-pay | Admitting: Physician Assistant

## 2022-07-31 ENCOUNTER — Other Ambulatory Visit: Payer: Self-pay | Admitting: Physician Assistant

## 2022-08-05 ENCOUNTER — Ambulatory Visit: Payer: Medicare Other | Admitting: Physician Assistant

## 2022-08-07 ENCOUNTER — Encounter: Payer: Self-pay | Admitting: Physician Assistant

## 2022-08-07 ENCOUNTER — Ambulatory Visit (INDEPENDENT_AMBULATORY_CARE_PROVIDER_SITE_OTHER): Payer: Medicare Other | Admitting: Physician Assistant

## 2022-08-07 DIAGNOSIS — F902 Attention-deficit hyperactivity disorder, combined type: Secondary | ICD-10-CM | POA: Diagnosis not present

## 2022-08-07 DIAGNOSIS — Z789 Other specified health status: Secondary | ICD-10-CM

## 2022-08-07 DIAGNOSIS — F411 Generalized anxiety disorder: Secondary | ICD-10-CM

## 2022-08-07 DIAGNOSIS — F3341 Major depressive disorder, recurrent, in partial remission: Secondary | ICD-10-CM | POA: Diagnosis not present

## 2022-08-07 DIAGNOSIS — F319 Bipolar disorder, unspecified: Secondary | ICD-10-CM | POA: Diagnosis not present

## 2022-08-07 MED ORDER — VENLAFAXINE HCL ER 150 MG PO CP24
300.0000 mg | ORAL_CAPSULE | Freq: Every day | ORAL | 3 refills | Status: DC
Start: 2022-08-07 — End: 2023-09-06

## 2022-08-07 MED ORDER — AMPHETAMINE-DEXTROAMPHET ER 10 MG PO CP24
10.0000 mg | ORAL_CAPSULE | Freq: Two times a day (BID) | ORAL | 0 refills | Status: DC
Start: 2022-08-07 — End: 2022-09-16

## 2022-08-07 NOTE — Progress Notes (Signed)
[facility] Check  Patient ID: Crystal Bean,  MRN: [id number]  PCP: Inda Coke, PA  Date of Evaluation: 08/07/2022 Time spent:30 minutes  Chief Complaint:  Chief Complaint   Anxiety; Depression; ADD    HISTORY/CURRENT STATUS: HPI For routine med check.  Doing well. Patient is able to enjoy things.  Energy and motivation are good.  No extreme sadness, tearfulness, or feelings of hopelessness.  ADLs and personal hygiene are normal.   Denies any changes in concentration, making decisions, or remembering things.  Adderall helps.  She does not work outside the home.  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  Appetite has not changed.  Weight is stable.  Denies suicidal or homicidal thoughts.  She and her husband moved since we last saw each other.  That went well.  It did cause increased anxiety but that is improving since they are now in their new home and almost settled.  No panic attacks.  Just has felt overwhelmed.  Has not taken Xanax in a while.  She sleeps well.  Patient denies increased energy with decreased need for sleep, increased talkativeness, racing thoughts, impulsivity or risky behaviors, increased spending, increased libido, grandiosity, increased irritability or anger, paranoia, or hallucinations.  Denies dizziness, syncope, seizures, numbness, tingling, tremor, tics, unsteady gait, slurred speech, confusion. Denies muscle or joint pain, stiffness, or dystonia. Denies unexplained weight loss, frequent infections, or sores that heal slowly.  No polyphagia, polydipsia, or polyuria. Denies visual changes or paresthesias.   Individual Medical History/ Review of Systems: Changes? :Yes [facility] she continues to do well.   Past medications for mental health diagnoses include: Prozac, Celexa, Lexapro, Wellbutrin, Effexor XR, Pristiq, Abilify,  Adderall, Ritalin, Klonopin, Xanax, Gabapentin, Buspar  Allergies: Prednisone, Meloxicam,  and Penicillins  Current Medications:  Current Outpatient Medications:    ARIPiprazole (ABILIFY) 10 MG tablet, TAKE ONE TABLET BY MOUTH DAILY, Disp: 90 tablet, Rfl: 3   aspirin EC 81 MG EC tablet, Take 1 tablet (81 mg total) by mouth daily. Swallow whole., Disp: 30 tablet, Rfl: 11   buPROPion (WELLBUTRIN XL) 300 MG 24 hr tablet, Take 1 tablet (300 mg total) by mouth daily., Disp: 90 tablet, Rfl: 3   Cholecalciferol (VITAMIN D3) 10 MCG (400 UNIT) CAPS, Take by mouth., Disp: , Rfl:    clopidogrel (PLAVIX) 75 MG tablet, Take 1 tablet (75 mg total) by mouth daily. Take 4 tabs this pm for a loading dose, then 1 tab daily., Disp: 34 tablet, Rfl: 11   Cyanocobalamin (VITAMIN B-12 PO), Take by mouth., Disp: , Rfl:    esomeprazole (NEXIUM) 20 MG capsule, Take 20 mg by mouth daily at 12 noon., Disp: , Rfl:    gabapentin (NEURONTIN) 100 MG capsule, TAKE ONE CAPSULE BY MOUTH THREE TIMES A DAY, Disp: 90 capsule, Rfl: 1   ketoconazole (NIZORAL) 2 % cream, APPLY TO AFFECTED AREA(S) ONCE TO TWICE DAILY, Disp: 30 g, Rfl: 0   lisinopril (ZESTRIL) 2.5 MG tablet, Take 1 tablet by mouth once daily, Disp: 90 tablet, Rfl: 3   metFORMIN (GLUCOPHAGE-XR) 500 MG 24 hr tablet, TAKE TWO TABLETS BY MOUTH DAILY WITH BREAKFAST, Disp: 60 tablet, Rfl: 2   metoprolol succinate (TOPROL-XL) 25 MG 24 hr tablet, Take 1 tablet by mouth once daily, Disp: 90 tablet, Rfl: 3   Multiple Vitamin (MULTIVITAMIN) tablet, Take 1 tablet by mouth daily., Disp: , Rfl:    nitroGLYCERIN (NITROSTAT) 0.4 MG SL tablet, Place 1 tablet (0.4 mg  total) under the tongue every 5 (five) minutes x 3 doses as needed for chest pain., Disp: 25 tablet, Rfl: 12   Omega-3 Fatty Acids (FISH OIL PO), Take by mouth., Disp: , Rfl:    rosuvastatin (CRESTOR) 40 MG tablet, Take 1 tablet by mouth once daily, Disp: 90 tablet, Rfl: 3   triamcinolone ointment (KENALOG) 0.5 %, Apply to affected area 1-2 times daily, Disp: 15 g, Rfl: 0   amphetamine-dextroamphetamine (ADDERALL  XR) 10 MG 24 hr capsule, Take 1 capsule (10 mg total) by mouth 2 (two) times daily with breakfast and lunch., Disp: 60 capsule, Rfl: 0   nortriptyline (PAMELOR) 25 MG capsule, Take 1 capsule (25 mg total) by mouth at bedtime. (Patient not taking: Reported on 08/07/2022), Disp: 30 capsule, Rfl: 2   venlafaxine XR (EFFEXOR-XR) 150 MG 24 hr capsule, Take 2 capsules (300 mg total) by mouth daily with breakfast., Disp: 180 capsule, Rfl: 3 Medication Side Effects: none  Family Medical/ Social History: Changes? She and husband moved to [place], in [place]:  Last menstrual period 06/25/2022.There is no height or weight on file to calculate BMI.  General Appearance: Casual, Well Groomed and Obese  Eye Contact:  Good  Speech:  Clear and Coherent and Normal Rate  Volume:  Normal  Mood:  Euthymic  Affect:  Congruent  Thought Process:  Goal Directed and Descriptions of Associations: Circumstantial  Orientation:  Full (Time, Place, and Person)  Thought Content: Logical   Suicidal Thoughts:  No  Homicidal Thoughts:  No  Memory:  WNL  Judgement:  Good  Insight:  Good  Psychomotor Activity:  Normal  Concentration:  Concentration: Good and Attention Span: Fair  Recall:  Good  Fund of Knowledge: Good  Language: Good  Assets:  Desire for Improvement Financial Resources/Insurance Housing Transportation  ADL's:  Intact  Cognition: WNL  Prognosis:  Good   Labs 07/09/2022 CBC WBC 11.8 otherwise normal CMP glucose 87 everything else normal Hemoglobin A1c 6.0  01/28/2022 CBC normal CMP normal  Lipids total cholesterol 84, triglycerides 68, HDL 39, LDL 30  DIAGNOSES:    ICD-10-CM   1. Recurrent major depressive disorder, in partial remission ([place])  F33.41     2. Attention deficit hyperactivity disorder (ADHD), combined type  F90.2     3. Generalized anxiety disorder  F41.1     4. Bipolar I disorder ([place])  F31.9     5. History of recent change in lifestyle  Z78.9        Receiving Psychotherapy: Yes  Leane Platt   RECOMMENDATIONS:  PDMP was reviewed.  Last Adderall filled 07/08/2022.  I provided 30 minutes of face to face time during this encounter, including time spent before and after the visit in records review, medical decision making, counseling pertinent to today's visit, and charting.   She is doing well with her mental health medications so no changes will be made.  Continue Xanax 1 mg, 1/2-1 po bid prn. Use sparingly.  (She has an old bottle, not listed on the med list.) Continue Adderall XR 10 mg, 1 p.o. at breakfast and 1 p.o. at lunch. Continue Abilify 10 mg, 1 p.o. every morning. Continue Wellbutrin XL 300 mg, 1 p.o. daily. Continue gabapentin 100 mg, 1-3 daily as directed. Continue Effexor XR 150 mg, 2 p.o. every morning. Continue therapy. Return in 6 months.  Donnal Moat, PA-C

## 2022-08-09 ENCOUNTER — Other Ambulatory Visit: Payer: Self-pay | Admitting: Physician Assistant

## 2022-08-09 ENCOUNTER — Telehealth: Payer: Self-pay | Admitting: Cardiology

## 2022-08-09 DIAGNOSIS — Z1231 Encounter for screening mammogram for malignant neoplasm of breast: Secondary | ICD-10-CM

## 2022-08-09 NOTE — Telephone Encounter (Signed)
Pt c/o medication issue:  1. Name of Medication: nortriptyline (PAMELOR) 25 MG capsule  2. How are you currently taking this medication (dosage and times per day)? Once daily  3. Are you having a reaction (difficulty breathing--STAT)? no  4. What is your medication issue? Patient states she was told it could affect her heart rate.  She wants to make sure it's okay to take.

## 2022-08-09 NOTE — Telephone Encounter (Signed)
Left detailed message for pt that we would send this message to our clinical pharmacist to check for contraindications.

## 2022-08-09 NOTE — Telephone Encounter (Signed)
Patient on many behavioral health medications.  Med list has that she is not currently taking nortriptyline.   Last EKG was normal, however: Concurrent use of venlafaxine and nortriptyline may result in an increased risk of cardiotoxicity (QT prolongation, torsades de pointes, cardiac arrest) and adverse effects of both drugs.    Could not see what she was being treated for so suggest she not take unless she consults with her [facility] provider.

## 2022-08-12 NOTE — Telephone Encounter (Signed)
Left message for pt to call back  °

## 2022-08-12 NOTE — Telephone Encounter (Signed)
Patient returning call.

## 2022-08-12 NOTE — Telephone Encounter (Signed)
Explained recommendations per clinical Pharmacist. Pt verbalizes understanding.

## 2022-08-24 ENCOUNTER — Encounter: Payer: Self-pay | Admitting: Physician Assistant

## 2022-09-02 ENCOUNTER — Other Ambulatory Visit: Payer: Self-pay | Admitting: Physician Assistant

## 2022-09-09 ENCOUNTER — Telehealth: Payer: Self-pay | Admitting: Physician Assistant

## 2022-09-09 MED ORDER — ARIPIPRAZOLE 10 MG PO TABS: 10.0000 mg | ORAL_TABLET | Freq: Every day | ORAL | 3 refills | Status: DC

## 2022-09-09 NOTE — Telephone Encounter (Signed)
Sent!

## 2022-09-09 NOTE — Telephone Encounter (Signed)
Crystal Bean called today at 2:30 to request refill of her Abilify.  She is changing pharmacies and they couldn't transfer this prescription.  Appt 02/06/23.  Send new prescription to [facility] in [place], [place]

## 2022-09-11 ENCOUNTER — Ambulatory Visit (INDEPENDENT_AMBULATORY_CARE_PROVIDER_SITE_OTHER): Payer: Medicare Other | Admitting: Family Medicine

## 2022-09-11 ENCOUNTER — Ambulatory Visit
Admission: RE | Admit: 2022-09-11 | Discharge: 2022-09-11 | Disposition: A | Discharge status: Home / Self Care | Payer: Medicare Other | Source: Ambulatory Visit | Attending: Physician Assistant | Admitting: Physician Assistant

## 2022-09-11 VITALS — BP 118/82 | HR 66 | Ht 67.0 in | Wt 230.0 lb

## 2022-09-11 DIAGNOSIS — I2510 Atherosclerotic heart disease of native coronary artery without angina pectoris: Secondary | ICD-10-CM

## 2022-09-11 DIAGNOSIS — G8929 Other chronic pain: Secondary | ICD-10-CM

## 2022-09-11 DIAGNOSIS — Z1231 Encounter for screening mammogram for malignant neoplasm of breast: Secondary | ICD-10-CM

## 2022-09-11 DIAGNOSIS — M5441 Lumbago with sciatica, right side: Secondary | ICD-10-CM | POA: Diagnosis not present

## 2022-09-11 NOTE — Progress Notes (Signed)
Rito Ehrlich, am serving as a Education administrator for Dr. [name].  Crystal Bean is a 46 y.o. female who presents to [place] [facility] today for back and leg pain. Patient was last seen on 07/08/22 and reported facet injections provided good relief. Pt notes only having LBP when "over doing it" which is hard because she is moving. since last visit patient had a move and was told to check back as needed. Patient states that the shots did not work at all. The pain is radiating more down her right leg that it is now going numb. Patient drive here she noticed the pain radiating down the left leg to her knee. The right side the pain radiates from the sciatic nerve to the front of the knee and the left side the pain radiates from the sciatic nerve to the back of the knee (which is new today since her 1 1/2 drive here).   She recently moved to an area near [place]  Pertinent review of systems: no fever or chills  Relevant historical information: Bipolar disorder.   Exam:  BP 118/82   Pulse 66   Ht '5\' 7"'$  (1.702 m)   Wt 230 lb (104.3 kg)   SpO2 99%   BMI 36.02 kg/m  General: Well Developed, well nourished, and in no acute distress.   MSK: L-spine: Normal. Nontender midline.  Decreased lumbar motion. Lower extremity strength is intact.    Lab and Radiology Results EXAM: MRI LUMBAR SPINE WITHOUT CONTRAST   TECHNIQUE: Multiplanar, multisequence MR imaging of the lumbar spine was performed. No intravenous contrast was administered.   COMPARISON:  Radiographs December 23, 2018.   FINDINGS: Segmentation:  Standard.   Alignment:  Small retrolisthesis at L2-3, L3-4 and L5-S1.   Vertebrae:  No fracture, evidence of discitis, or bone lesion.   Conus medullaris and cauda equina: Conus extends to the L1-2 level. Conus and cauda equina appear normal.   Paraspinal and other soft tissues: Cholelithiasis. A 2.4 cm low simple left renal cyst.   Disc levels:    T12-L1 no spinal canal or neural foraminal stenosis.   L1-2: No spinal canal or neural foraminal stenosis.   L2-3: Shallow disc bulge and mild facet degenerative changes with trace left joint effusion. No significant spinal canal or neural foraminal stenosis.   L3-4: Disc bulge, mild facet degenerative changes with trace bilateral joint effusion and mild ligamentum flavum redundancy resulting in mild spinal canal stenosis, moderate right and mild left neural foraminal narrowing.   L4-5: Disc bulge, moderate facet degenerative changes with ligamentum flavum redundancy resulting in mild spinal canal mild narrowing of the bilateral subarticular zones. No significant neural foraminal narrowing.   L5-S1: Loss of disc height, disc bulge with superimposed left central disc protrusion, mild facet degenerative changes and ligamentum flavum redundancy. Findings result in mild right and moderate left subarticular zone stenosis, mild right and moderate left neural foraminal narrowing.   IMPRESSION: 1. At L3-4, moderate right and mild left neural foraminal narrowing. 2. At L4-5, mild spinal canal stenosis with mild narrowing of the bilateral subarticular zones. 3. At L5-S1, mild right and moderate left subarticular zone stenosis, mild right and moderate left neural foraminal narrowing.     Electronically Signed   By: Pedro Earls M.D.   On: 05/28/2022 07:42 I, [name], personally (independently) visualized and performed the interpretation of the images attached in this note.   FLUOROSCOPY: Radiation Exposure Index (as provided by  the fluoroscopic device): Dose area product 53.22 uGy*m2   PROCEDURE: DG FACET JT INJ L OR S SPINE SINGLE LEVEL UNILATERAL; DG FACET INJECTION SECOND LEVEL RIGHT   The procedure, risks, benefits, and alternatives were explained to the patient. Questions regarding the procedure were encouraged and answered. The patient understands and  consents to the procedure.   Right L4-5FACET INJECTION: A posterior oblique approach was taken to the facet on the right at L4-5 using a curved 22 gauge spinal needle. Intra-articular positioning was confirmed by injecting a small amount of Isovue-M 200. No vascular opacification is seen. '40mg'$  of Depo-Medrol mixed with 1 mL 1% lidocaine were instilled into the joint. The injection resulted in concordant pain. The procedure was well-tolerated.   Right L5-S1FACET INJECTION: A posterior oblique approach was taken to the facet on the right at L5-S1 using a curved 22 gauge spinal needle. Intra-articular positioning was confirmed by injecting a small amount of Isovue-M 200. No vascular opacification is seen. '40mg'$  of Depo-Medrol mixed with 1 mL 1% lidocaine were instilled into the joint. The injection resulted in concordant pain. The procedure was well-tolerated.   IMPRESSION: Technically successful first right L4-5 and L5-S1 facet injections.     Electronically Signed   By: San Morelle M.D.   On: 06/12/2022 10:19    Assessment and Plan: 46 y.o. female with chronic low back pain with new bilateral lumbar radicular pain. Plan for epidural steroid injection.  She has several potential targets.  We will have her discuss most likely location with the radiologist impact from there.  From a more long-term perspective she moved an hour and a half away from [place] and probably should shift her spine care someplace closer to home.  Doing some googling there is a pain management clinic in [place] that may be a good option.  I placed a referral to that location.  If it turns out this is not going to be a good location for her [facility] I believe has pain management spine interventional locations [place] that she can pick.  I am always going to be here for her if she needs.   PDMP not reviewed this encounter. Orders Placed This Encounter  Procedures   DG INJECT  DIAG/THERA/INC NEEDLE/CATH/PLC EPI/LUMB/SAC W/IMG    Standing Status:   Future    Standing Expiration Date:   09/12/2023    Order Specific Question:   Reason for Exam (SYMPTOM  OR DIAGNOSIS REQUIRED)    Answer:   ESI BL leg pain. Level and technique per radiology.    Order Specific Question:   Is the patient pregnant?    Answer:   No    Order Specific Question:   Preferred Imaging Location?    Answer:   [id number]    Order Specific Question:   Radiology Contrast Protocol - do NOT remove file path    Answer:   [email or web address]   Ambulatory referral to Pain Clinic    Referral Priority:   Routine    Referral Type:   Consultation    Referral Reason:   Specialty Services Required    Requested Specialty:   Pain Medicine    Number of Visits Requested:   1   No orders of the defined types were placed in this encounter.    Discussed warning signs or symptoms. Please see discharge instructions. Patient expresses understanding.   The above documentation has been reviewed and is accurate and complete [name], M.D. Total encounter time 30  minutes including face-to-face time with the patient and, reviewing past medical record, and charting on the date of service.

## 2022-09-11 NOTE — Patient Instructions (Addendum)
Thank you for coming in today.   Please call [facility] at [phone] to schedule your spine injection.    Call [facility] at [phone] and get them to make you a CD of your MRI. Pick it up today on your way out of town and bring it with you to future spine appointments out of network.    Let me know if [facility] in [place] is not your people.

## 2022-09-12 ENCOUNTER — Telehealth: Payer: Self-pay | Admitting: Physician Assistant

## 2022-09-12 NOTE — Telephone Encounter (Signed)
ERROR

## 2022-09-12 NOTE — Telephone Encounter (Signed)
..  Type of form received: [facility] patient assistant form   Additional comments:   Received by: patient drop off/ [facility]  Form should be Faxed [phone]   Form should be mailed to:  na  Is patient requesting call for pickup: yes    Form placed:  sam's folder   Attach charge sheet.  Yes   Individual made aware of 3-5 business day turn around (Y/N)?  Yes

## 2022-09-13 ENCOUNTER — Other Ambulatory Visit: Payer: Self-pay | Admitting: Cardiology

## 2022-09-13 ENCOUNTER — Other Ambulatory Visit: Payer: Self-pay | Admitting: Physician Assistant

## 2022-09-14 ENCOUNTER — Other Ambulatory Visit: Payer: Self-pay | Admitting: Physician Assistant

## 2022-09-16 ENCOUNTER — Telehealth: Payer: Self-pay | Admitting: Physician Assistant

## 2022-09-16 ENCOUNTER — Other Ambulatory Visit: Payer: Self-pay

## 2022-09-16 MED ORDER — AMPHETAMINE-DEXTROAMPHET ER 10 MG PO CP24
10.0000 mg | ORAL_CAPSULE | Freq: Two times a day (BID) | ORAL | 0 refills | Status: DC
Start: 2022-09-16 — End: 2022-10-18

## 2022-09-16 NOTE — Telephone Encounter (Signed)
Next visit is 02/05/22. Requesting refill on Adderall called to :  [facility], [place] - [address]  Phone:  [phone]  Fax:  [phone]    Per patient it is in stock.

## 2022-09-16 NOTE — Telephone Encounter (Signed)
Pended.

## 2022-10-09 ENCOUNTER — Encounter: Payer: Self-pay | Admitting: Cardiology

## 2022-10-12 ENCOUNTER — Other Ambulatory Visit: Payer: Self-pay | Admitting: Physician Assistant

## 2022-10-14 ENCOUNTER — Telehealth: Payer: Self-pay

## 2022-10-14 NOTE — Telephone Encounter (Signed)
Helping with preop today. Will route to preop callback team to get clarification on intake of what the procedure is that's taking place. Is this for a surgery or injection? Also cannot tell if we need to hold both ASA + Plavix or just Plavix. She had NSTEMI 12/2021 so will likely need to review with MD.

## 2022-10-14 NOTE — Telephone Encounter (Signed)
..     Pre-operative Risk Assessment    Patient Name: [place]  DOB: May 01, 1976 MRN: [id number]      Request for Surgical Clearance    Procedure:   LUMBAR EPIDURAL  Date of Surgery:  Clearance TBD                                 Surgeon:  Gilbert or Practice Name:  [facility] Phone number:  [phone] Fax number:  [phone]   Type of Clearance Requested:   - Medical  - Pharmacy:  Hold Aspirin and Clopidogrel (Plavix) WOULD LIKE TO HOLD PLAVIX FOR FIVE DAYS   Type of Anesthesia:  Not Indicated   Additional requests/questions:    [name]   10/14/2022, 2:52 PM

## 2022-10-16 NOTE — Telephone Encounter (Signed)
Left VM at [facility] requesting call back with procedural details and information on hold ASA+Plavix or Plavix alone.   Loel Dubonnet, NP

## 2022-10-16 NOTE — Telephone Encounter (Signed)
Procedure is injection.

## 2022-10-16 NOTE — Telephone Encounter (Signed)
Preoperative team patient underwent cardiac catheterization with stent placement 12/28/2021.  Recommendations were made for an interruption of dual antiplatelet therapy for 12 months.  She will not be eligible for holding her dual antiplatelet therapy until after 12/28/2022.  Please contact requesting office and let them know that they will need to resubmit their request for epidural injection at a later date.  Cessation of dual antiplatelet therapy at this time will risk compromise of her stenting.  Thank you for your help.  Jossie Ng. Dazaria Macneill NP-C     10/16/2022, 3:53 PM [place] [address] Office [phone] Fax [phone]

## 2022-10-17 ENCOUNTER — Telehealth: Payer: Self-pay | Admitting: Family Medicine

## 2022-10-17 NOTE — Telephone Encounter (Signed)
S/w Almyra Free at Dr. Clovis Riley office to let know that pt cannot receive injection till after 12/28/22 due to not a year on antiplatelet therapy. Call pt and per DPR lvm with this information.

## 2022-10-17 NOTE — Telephone Encounter (Signed)
Per [facility], pt cannot go off of her Plavix therapy for 12 months, so they cannot clear patient to have an epidural until after 12/28/2022.   [facility] is notifying pt and [facility].

## 2022-10-18 ENCOUNTER — Other Ambulatory Visit: Payer: Self-pay

## 2022-10-18 ENCOUNTER — Telehealth: Payer: Self-pay | Admitting: Physician Assistant

## 2022-10-18 MED ORDER — AMPHETAMINE-DEXTROAMPHET ER 10 MG PO CP24
10.0000 mg | ORAL_CAPSULE | Freq: Two times a day (BID) | ORAL | 0 refills | Status: DC
Start: 2022-10-18 — End: 2022-11-22

## 2022-10-18 NOTE — Telephone Encounter (Signed)
Pended.

## 2022-10-18 NOTE — Telephone Encounter (Signed)
Clearance request received again. I see notes were faxed to the requesting office that pt cannot hold Plavix until 12/28/22, see notes from pre op provider [name], [place].

## 2022-10-18 NOTE — Telephone Encounter (Signed)
Next visit is 02/05/22. Requesting refill on Adderall called to :   [facility], [place] - [address]   Phone:  [phone]  Fax:  [phone]      Per patient it is in stock.

## 2022-10-21 ENCOUNTER — Encounter: Payer: Self-pay | Admitting: Family Medicine

## 2022-10-22 ENCOUNTER — Other Ambulatory Visit: Payer: Self-pay

## 2022-10-22 MED ORDER — GABAPENTIN 300 MG PO CAPS: 300.0000 mg | ORAL_CAPSULE | Freq: Three times a day (TID) | ORAL | 2 refills | Status: DC

## 2022-10-31 ENCOUNTER — Other Ambulatory Visit: Payer: Self-pay | Admitting: Physician Assistant

## 2022-11-09 ENCOUNTER — Other Ambulatory Visit: Payer: Self-pay | Admitting: Physician Assistant

## 2022-11-19 ENCOUNTER — Ambulatory Visit: Payer: Medicare Other | Attending: Nurse Practitioner | Admitting: Nurse Practitioner

## 2022-11-19 ENCOUNTER — Encounter: Payer: Self-pay | Admitting: Nurse Practitioner

## 2022-11-19 VITALS — BP 116/64 | HR 66 | Ht 67.0 in | Wt 229.6 lb

## 2022-11-19 DIAGNOSIS — E785 Hyperlipidemia, unspecified: Secondary | ICD-10-CM | POA: Diagnosis not present

## 2022-11-19 DIAGNOSIS — I2510 Atherosclerotic heart disease of native coronary artery without angina pectoris: Secondary | ICD-10-CM | POA: Diagnosis not present

## 2022-11-19 DIAGNOSIS — I10 Essential (primary) hypertension: Secondary | ICD-10-CM

## 2022-11-19 NOTE — Patient Instructions (Signed)
Medication Instructions:  Your physician recommends that you continue on your current medications as directed. Please refer to the Current Medication list given to you today.   *If you need a refill on your cardiac medications before your next appointment, please call your pharmacy*   Lab Work: NONE ordered at this time of appointment   If you have labs (blood work) drawn today and your tests are completely normal, you will receive your results only by: MyChart Message (if you have MyChart) OR A paper copy in the mail If you have any lab test that is abnormal or we need to change your treatment, we will call you to review the results.   Testing/Procedures: NONE ordered at this time of appointment     Follow-Up: At [facility], you and your health needs are our priority.  As part of our continuing mission to provide you with exceptional heart care, we have created designated Provider Care Teams.  These Care Teams include your primary Cardiologist (physician) and Advanced Practice Providers (APPs -  Physician Assistants and Nurse Practitioners) who all work together to provide you with the care you need, when you need it.  We recommend signing up for the patient portal called "MyChart".  Sign up information is provided on this After Visit Summary.  MyChart is used to connect with patients for Virtual Visits (Telemedicine).  Patients are able to view lab/test results, encounter notes, upcoming appointments, etc.  Non-urgent messages can be sent to your provider as well.   To learn more about what you can do with MyChart, go to [email or web address].    Your next appointment:   6 month(s)  The format for your next appointment:   In Person  Provider:   Brian Crenshaw, MD     Other Instructions   Important Information About Sugar       

## 2022-11-19 NOTE — Progress Notes (Addendum)
Office Visit    Patient Name: [place] Date of Encounter: 11/19/2022  Primary Care Provider:  Inda Coke, [place] Primary Cardiologist:  Kirk Ruths, MD  Chief Complaint    47 year old female with a history of CAD s/p NSTEMI, DES-LAD in 12/2021, hypertension, hyperlipidemia, bipolar disorder, anxiety, PTSD, and GERD who presents for follow-up related to CAD.  Past Medical History    Past Medical History:  Diagnosis Date   ADHD (attention deficit hyperactivity disorder)    Anxiety    B12 deficiency    Back pain    Bipolar 1 disorder (HCC)    Bulging lumbar disc    C. difficile diarrhea 04/24/2016   treated with metronidazole   Constipation    Depression    GERD (gastroesophageal reflux disease)    History of chicken pox    History of shingles 2013   Hypertension    IBS (irritable bowel syndrome)    Joint pain    Major depressive disorder    Migraines    Obesity    Obsessive-compulsive disorder    Osteoarthritis    Prediabetes    PTSD (post-traumatic stress disorder)    Sleep apnea    SOB (shortness of breath)    Past Surgical History:  Procedure Laterality Date   BLADDER SURGERY  1981   Urethera stretched   caps on teeth  1980   Caps on all Teeth   CESAREAN SECTION  2003   CORONARY STENT INTERVENTION N/A 12/28/2021   Procedure: CORONARY STENT INTERVENTION;  Surgeon: Martinique, Peter M, MD;  Location: [facility];  Service: Cardiovascular;  Laterality: N/A;   LEFT HEART CATH AND CORONARY ANGIOGRAPHY N/A 12/28/2021   Procedure: LEFT HEART CATH AND CORONARY ANGIOGRAPHY;  Surgeon: Martinique, Peter M, MD;  Location: [facility];  Service: Cardiovascular;  Laterality: N/A;   TONSILLECTOMY     WISDOM TOOTH EXTRACTION      Allergies  Allergies  Allergen Reactions   Prednisone Other (See Comments)    Severe mood swings   Meloxicam Rash   Penicillins Rash    History of Present Illness    47 year old female with the above past medical history  including CAD s/p NSTEMI, DES-LAD in 12/2021, hypertension, hyperlipidemia, bipolar disorder, anxiety, PTSD, and GERD.  She was hospitalized in 12/2021 in the setting of NSTEMI.  Echocardiogram showed normal LV function, mild LVH, G2 DD, mild LAE, mild MR.  Cardiac catheterization revealed 80% pLAD stenosis, s/p DES, EF 55 to 65%.  She was last seen in the office on 05/20/2022 and was stable from a cardiac standpoint.  She denied symptoms concerning for angina.  She presents today for follow-up. Since her last visit has done well from a cardiac standpoint.  She denies any symptoms concerning for angina.  Overall, she reports feeling well.  Home Medications    Current Outpatient Medications  Medication Sig Dispense Refill   amphetamine-dextroamphetamine (ADDERALL XR) 10 MG 24 hr capsule Take 1 capsule (10 mg total) by mouth 2 (two) times daily with breakfast and lunch. 60 capsule 0   ARIPiprazole (ABILIFY) 10 MG tablet Take 1 tablet (10 mg total) by mouth daily. 90 tablet 3   aspirin EC 81 MG EC tablet Take 1 tablet (81 mg total) by mouth daily. Swallow whole. 30 tablet 11   buPROPion (WELLBUTRIN XL) 300 MG 24 hr tablet Take 1 tablet by mouth once daily 90 tablet 0   Cholecalciferol (VITAMIN D3) 10 MCG (400 UNIT) CAPS Take  by mouth.     clopidogrel (PLAVIX) 75 MG tablet Take 1 tablet (75 mg total) by mouth daily. 90 tablet 3   Cyanocobalamin (VITAMIN B-12 PO) Take by mouth.     esomeprazole (NEXIUM) 20 MG capsule Take 20 mg by mouth daily at 12 noon.     gabapentin (NEURONTIN) 100 MG capsule TAKE 1 CAPSULE BY MOUTH THREE TIMES DAILY 90 capsule 2   gabapentin (NEURONTIN) 300 MG capsule Take 1 capsule (300 mg total) by mouth 3 (three) times daily. (Patient not taking: Reported on 11/19/2022) 90 capsule 2   ketoconazole (NIZORAL) 2 % cream APPLY TO AFFECTED AREA(S) ONCE TO TWICE DAILY 30 g 0   lisinopril (ZESTRIL) 2.5 MG tablet Take 1 tablet by mouth once daily 90 tablet 3   metFORMIN (GLUCOPHAGE-XR) 500  MG 24 hr tablet TAKE 2 TABLETS BY MOUTH ONCE DAILY WITH  BREAKFAST. 120 tablet 0   metoprolol succinate (TOPROL-XL) 25 MG 24 hr tablet Take 1 tablet by mouth once daily 90 tablet 3   Multiple Vitamin (MULTIVITAMIN) tablet Take 1 tablet by mouth daily.     nitroGLYCERIN (NITROSTAT) 0.4 MG SL tablet Place 1 tablet (0.4 mg total) under the tongue every 5 (five) minutes x 3 doses as needed for chest pain. 25 tablet 12   Omega-3 Fatty Acids (FISH OIL PO) Take by mouth.     rosuvastatin (CRESTOR) 40 MG tablet Take 1 tablet by mouth once daily 90 tablet 3   triamcinolone ointment (KENALOG) 0.5 % Apply to affected area 1-2 times daily 15 g 0   venlafaxine XR (EFFEXOR-XR) 150 MG 24 hr capsule Take 2 capsules (300 mg total) by mouth daily with breakfast. 180 capsule 3   nortriptyline (PAMELOR) 25 MG capsule Take 1 capsule (25 mg total) by mouth at bedtime. (Patient not taking: Reported on 11/19/2022) 30 capsule 2   No current facility-administered medications for this visit.     Review of Systems    She denies chest pain, palpitations, dyspnea, pnd, orthopnea, n, v, dizziness, syncope, edema, weight gain, or early satiety. All other systems reviewed and are otherwise negative except as noted above.   Physical Exam    VS:  BP 116/64 (BP Location: Left Arm, Patient Position: Sitting, Cuff Size: Large)   Pulse 66   Ht '5\' 7"'$  (1.702 m)   Wt 229 lb 9.6 oz (104.1 kg)   SpO2 97%   BMI 35.96 kg/m   GEN: Well nourished, well developed, in no acute distress. HEENT: normal. Neck: Supple, no JVD, carotid bruits, or masses. Cardiac: RRR, no murmurs, rubs, or gallops. No clubbing, cyanosis, edema.  Radials/DP/PT 2+ and equal bilaterally.  Respiratory:  Respirations regular and unlabored, clear to auscultation bilaterally. GI: Soft, nontender, nondistended, BS + x 4. MS: no deformity or atrophy. Skin: warm and dry, no rash. Neuro:  Strength and sensation are intact. Psych: Normal affect.  Accessory Clinical  Findings    ECG personally reviewed by me today -NSR, 66 bpm- no acute changes.   Lab Results  Component Value Date   WBC 11.8 (H) 07/09/2022   HGB 12.1 07/09/2022   HCT 37.2 07/09/2022   MCV 87.3 07/09/2022   PLT 372.0 07/09/2022   Lab Results  Component Value Date   CREATININE 0.98 07/09/2022   BUN 10 07/09/2022   NA 136 07/09/2022   K 4.6 07/09/2022   CL 101 07/09/2022   CO2 28 07/09/2022   Lab Results  Component Value Date   ALT 16  07/09/2022   AST 15 07/09/2022   ALKPHOS 69 07/09/2022   BILITOT 0.4 07/09/2022   Lab Results  Component Value Date   CHOL 84 (L) 02/15/2022   HDL 39 (L) 02/15/2022   LDLCALC 30 02/15/2022   LDLDIRECT 111.0 10/18/2019   TRIG 68 02/15/2022   CHOLHDL 2.2 02/15/2022    Lab Results  Component Value Date   HGBA1C 6.0 07/09/2022    Assessment & Plan    1. CAD: S/p DES-LAD in 12/2021. Stable with no anginal symptoms. No indication for ischemic evaluation.  She is almost 1 year post PCI.  Will discuss with Dr. Stanford Breed recommendations for ongoing antiplatelet therapy.  For now, continue aspirin, Plavix, metoprolol, lisinopril, and Crestor.  ADDENDUM 11/19/2022: Per Dr. Stanford Breed, primary cardiologist, patient may stop Plavix after 12/28/2022 as she will be 1 year post PCI.  Will continue Aspirin 81 mg daily beyond the 1 year mark. Patient notified of recommendations via telephone and she verbalized understanding.  2. Hypertension: BP well controlled. Continue current antihypertensive regimen.   3. Hyperlipidemia: LDL was 30 in 01/2022.  She will be due for repeat lipids, LFTs at next follow-up visit. Continue aspirin, Crestor.   4. Disposition: Follow-up in 6 months.      Lenna Sciara, NP 11/19/2022, 2:00 PM

## 2022-11-22 ENCOUNTER — Other Ambulatory Visit: Payer: Self-pay

## 2022-11-22 ENCOUNTER — Telehealth: Payer: Self-pay | Admitting: Physician Assistant

## 2022-11-22 MED ORDER — AMPHETAMINE-DEXTROAMPHET ER 10 MG PO CP24
10.0000 mg | ORAL_CAPSULE | Freq: Two times a day (BID) | ORAL | 0 refills | Status: DC
Start: 2022-11-22 — End: 2022-12-24

## 2022-11-22 NOTE — Telephone Encounter (Signed)
Pt lvm asking for adderall refill. The [facility] in [place] has it in stock

## 2022-11-22 NOTE — Telephone Encounter (Signed)
Pended.

## 2022-12-01 ENCOUNTER — Encounter: Payer: Self-pay | Admitting: Physician Assistant

## 2022-12-07 ENCOUNTER — Other Ambulatory Visit: Payer: Self-pay | Admitting: Physician Assistant

## 2022-12-08 ENCOUNTER — Other Ambulatory Visit: Payer: Self-pay | Admitting: Physician Assistant

## 2022-12-09 MED ORDER — KETOCONAZOLE 2 % EX CREA: TOPICAL_CREAM | CUTANEOUS | 0 refills | Status: DC

## 2022-12-09 MED ORDER — TRIAMCINOLONE ACETONIDE 0.5 % EX OINT: TOPICAL_OINTMENT | CUTANEOUS | 0 refills | Status: DC

## 2022-12-10 ENCOUNTER — Other Ambulatory Visit: Payer: Self-pay | Admitting: Physician Assistant

## 2022-12-11 ENCOUNTER — Ambulatory Visit (INDEPENDENT_AMBULATORY_CARE_PROVIDER_SITE_OTHER): Payer: Medicare Other | Admitting: Physician Assistant

## 2022-12-11 ENCOUNTER — Encounter: Payer: Self-pay | Admitting: Physician Assistant

## 2022-12-11 ENCOUNTER — Ambulatory Visit (INDEPENDENT_AMBULATORY_CARE_PROVIDER_SITE_OTHER): Payer: Medicare Other | Admitting: Family Medicine

## 2022-12-11 VITALS — BP 102/70 | HR 59 | Temp 97.3°F | Ht 67.0 in | Wt 234.0 lb

## 2022-12-11 VITALS — BP 102/70 | HR 59 | Ht 67.0 in | Wt 234.0 lb

## 2022-12-11 DIAGNOSIS — M5136 Other intervertebral disc degeneration, lumbar region: Secondary | ICD-10-CM

## 2022-12-11 DIAGNOSIS — R296 Repeated falls: Secondary | ICD-10-CM | POA: Diagnosis not present

## 2022-12-11 DIAGNOSIS — I10 Essential (primary) hypertension: Secondary | ICD-10-CM

## 2022-12-11 DIAGNOSIS — R5383 Other fatigue: Secondary | ICD-10-CM

## 2022-12-11 DIAGNOSIS — R253 Fasciculation: Secondary | ICD-10-CM

## 2022-12-11 DIAGNOSIS — I2510 Atherosclerotic heart disease of native coronary artery without angina pectoris: Secondary | ICD-10-CM

## 2022-12-11 DIAGNOSIS — R29898 Other symptoms and signs involving the musculoskeletal system: Secondary | ICD-10-CM | POA: Diagnosis not present

## 2022-12-11 LAB — COMPREHENSIVE METABOLIC PANEL
ALT: 15 U/L (ref 0–35)
AST: 13 U/L (ref 0–37)
Albumin: 4 g/dL (ref 3.5–5.2)
Alkaline Phosphatase: 68 U/L (ref 39–117)
BUN: 8 mg/dL (ref 6–23)
CO2: 29 mEq/L (ref 19–32)
Calcium: 9.3 mg/dL (ref 8.4–10.5)
Chloride: 102 mEq/L (ref 96–112)
Creatinine, Ser: 0.82 mg/dL (ref 0.40–1.20)
GFR: 85.34 mL/min (ref >60.00)
Glucose, Bld: 87 mg/dL (ref 70–99)
Potassium: 4.3 mEq/L (ref 3.5–5.1)
Sodium: 138 mEq/L (ref 135–145)
Total Bilirubin: 0.3 mg/dL (ref 0.2–1.2)
Total Protein: 7.1 g/dL (ref 6.0–8.3)

## 2022-12-11 LAB — CK: Total CK: 91 U/L (ref 7–177)

## 2022-12-11 LAB — TSH: TSH: 1.79 u[IU]/mL (ref 0.35–5.50)

## 2022-12-11 LAB — MAGNESIUM: Magnesium: 2 mg/dL (ref 1.5–2.5)

## 2022-12-11 NOTE — Patient Instructions (Signed)
It was great to see you!  Consider trial of 200 mg gabapentin twice daily to see if this helps your symptoms  Go see Dr Georgina Snell since you are in town, but we probably will need to get you back with Dr. Leta Baptist at [facility]  Let's follow-up in 6 months for a physical, sooner if you have concerns.  ake care,  Inda Coke PA-C

## 2022-12-11 NOTE — Progress Notes (Addendum)
Crystal [place] is a 47 y.o. female here for a follow up of a pre-existing problem.  History of Present Illness:   Chief Complaint  Patient presents with   balance issues    Pt has been having balance issues and has fallen in the past 6 months about 4 times.     Leg twitching    Pt c/o left leg twitching for the past few months off and on.    HPI   Falls She is having recurrent falls wherein she trips on her right foot.  Happens when she is walking up stairs. Does not use any assistive devices when walking. Does admit that her stairs to laundry room do not have handle -- this is where she fell most recently. She reports bruising but denies any other injuries from these falls. Denies LOC, unusual HA, vision changes, slurred speech.  She has seen neurology as recently as 09/27/2020 with Dr. Leta Baptist at [place].  She saw him for memory loss, numbness and blurred vision and had an MRI that was essentially normal.  She has not followed up with him since.  She states that she still has ongoing memory issues.   Muscle twitching She is also having muscle spasms in her legs which last ~30 seconds at a time.  Can happen 30 times in a row and is not painful but is just annoying.   Is happening a few times per week.   Can happen in arms and legs.   Symptoms usually stop when she stands. She is taking 100 mg Gabapentin x3 daily to manage leg/arm neuropathy.  She tried to increase to 300 mg but this caused mood swings. She is not doing PT exercises at home. She is followed by Dr. [name], sports med.   H/o NSTEMI H/o CAD s/p NSTEMI, DES-LAD in 12/2021.  Cardiology is planning to discontinue her Plavix after 12/28/22. She is followed by Dr. Stanford Breed. She last saw cardiology NP Diona Browner on 11/19/2022. Patient denies any angina symptoms.  Hypertension BP well controlled on Toprol XL '25mg'$  daily, lisinopril 2.5 mg daily BP Readings from Last 3 Encounters:   12/11/22 102/70  11/19/22 116/64  09/11/22 118/82     Past Medical History:  Diagnosis Date   ADHD (attention deficit hyperactivity disorder)    Anxiety    B12 deficiency    Back pain    Bipolar 1 disorder (HCC)    Bulging lumbar disc    C. difficile diarrhea 04/24/2016   treated with metronidazole   Constipation    Depression    GERD (gastroesophageal reflux disease)    History of chicken pox    History of shingles 2013   Hypertension    IBS (irritable bowel syndrome)    Joint pain    Major depressive disorder    Migraines    Obesity    Obsessive-compulsive disorder    Osteoarthritis    Prediabetes    PTSD (post-traumatic stress disorder)    Sleep apnea    SOB (shortness of breath)      Social History   Tobacco Use   Smoking status: Former    Packs/day: 1.00    Years: 20.00    Total pack years: 20.00    Types: Cigarettes    Quit date: 06/19/2011    Years since quitting: 11.4   Smokeless tobacco: Never  Vaping Use   Vaping Use: Never used  Substance Use Topics   Alcohol use: Not Currently  Drug use: No    Past Surgical History:  Procedure Laterality Date   BLADDER SURGERY  1981   Urethera stretched   caps on teeth  1980   Caps on all Teeth   CESAREAN SECTION  2003   CORONARY STENT INTERVENTION N/A 12/28/2021   Procedure: CORONARY STENT INTERVENTION;  Surgeon: Martinique, Peter M, MD;  Location: [facility];  Service: Cardiovascular;  Laterality: N/A;   LEFT HEART CATH AND CORONARY ANGIOGRAPHY N/A 12/28/2021   Procedure: LEFT HEART CATH AND CORONARY ANGIOGRAPHY;  Surgeon: Martinique, Peter M, MD;  Location: [facility];  Service: Cardiovascular;  Laterality: N/A;   TONSILLECTOMY     WISDOM TOOTH EXTRACTION      Family History  Problem Relation Age of Onset   Osteoporosis Mother    Depression Mother    Obesity Mother    Hearing loss Father    Obesity Father    Healthy Sister    CVA Maternal Grandmother    Heart disease Maternal  Grandmother    Arthritis Paternal Grandmother    Hearing loss Paternal Grandmother    Dementia Paternal Grandmother    Dementia Paternal Grandfather    Eczema Daughter     Allergies  Allergen Reactions   Gabapentin Other (See Comments)    Pt can not tolerated large dose give her mood swings   Prednisone Other (See Comments)    Severe mood swings   Meloxicam Rash   Penicillins Rash    Current Medications:   Current Outpatient Medications:    amphetamine-dextroamphetamine (ADDERALL XR) 10 MG 24 hr capsule, Take 1 capsule (10 mg total) by mouth 2 (two) times daily with breakfast and lunch., Disp: 60 capsule, Rfl: 0   ARIPiprazole (ABILIFY) 10 MG tablet, Take 1 tablet (10 mg total) by mouth daily., Disp: 90 tablet, Rfl: 3   aspirin EC 81 MG EC tablet, Take 1 tablet (81 mg total) by mouth daily. Swallow whole., Disp: 30 tablet, Rfl: 11   buPROPion (WELLBUTRIN XL) 300 MG 24 hr tablet, Take 1 tablet by mouth once daily, Disp: 90 tablet, Rfl: 0   Cholecalciferol (VITAMIN D3) 10 MCG (400 UNIT) CAPS, Take by mouth., Disp: , Rfl:    clopidogrel (PLAVIX) 75 MG tablet, Take 1 tablet (75 mg total) by mouth daily., Disp: 90 tablet, Rfl: 3   Cyanocobalamin (VITAMIN B-12 PO), Take by mouth., Disp: , Rfl:    esomeprazole (NEXIUM) 20 MG capsule, Take 20 mg by mouth daily at 12 noon., Disp: , Rfl:    gabapentin (NEURONTIN) 100 MG capsule, TAKE 1 CAPSULE BY MOUTH THREE TIMES DAILY, Disp: 90 capsule, Rfl: 2   ketoconazole (NIZORAL) 2 % cream, APPLY TO AFFECTED AREA(S) ONCE TO TWICE DAILY, Disp: 30 g, Rfl: 0   lisinopril (ZESTRIL) 2.5 MG tablet, Take 1 tablet by mouth once daily, Disp: 90 tablet, Rfl: 3   metFORMIN (GLUCOPHAGE-XR) 500 MG 24 hr tablet, TAKE 2 TABLETS BY MOUTH ONCE DAILY WITH BREAKFAST, Disp: 120 tablet, Rfl: 0   metoprolol succinate (TOPROL-XL) 25 MG 24 hr tablet, Take 1 tablet by mouth once daily, Disp: 90 tablet, Rfl: 3   Multiple Vitamin (MULTIVITAMIN) tablet, Take 1 tablet by mouth  daily., Disp: , Rfl:    nitroGLYCERIN (NITROSTAT) 0.4 MG SL tablet, Place 1 tablet (0.4 mg total) under the tongue every 5 (five) minutes x 3 doses as needed for chest pain., Disp: 25 tablet, Rfl: 12   Omega-3 Fatty Acids (FISH OIL PO), Take by mouth.,  Disp: , Rfl:    rosuvastatin (CRESTOR) 40 MG tablet, Take 1 tablet by mouth once daily, Disp: 90 tablet, Rfl: 3   triamcinolone ointment (KENALOG) 0.5 %, Apply to affected area 1-2 times daily, Disp: 15 g, Rfl: 0   venlafaxine XR (EFFEXOR-XR) 150 MG 24 hr capsule, Take 2 capsules (300 mg total) by mouth daily with breakfast., Disp: 180 capsule, Rfl: 3   Review of Systems:   Review of Systems  Constitutional:  Negative for fever and malaise/fatigue.  HENT:  Negative for congestion.   Eyes:  Negative for blurred vision.  Respiratory:  Negative for cough and shortness of breath.   Cardiovascular:  Negative for chest pain, palpitations and leg swelling.  Gastrointestinal:  Negative for vomiting.  Genitourinary:        (-) Incontinence  Musculoskeletal:  Positive for falls. Negative for back pain.       + muscle twitching BLE  Skin:  Negative for rash.  Neurological:  Positive for tingling (pins/needles bilateral legs and arms). Negative for loss of consciousness, weakness and headaches.    Vitals:   Vitals:   12/11/22 1003  BP: 102/70  Pulse: (!) 59  Temp: (!) 97.3 F (36.3 C)  TempSrc: Temporal  SpO2: 96%  Weight: 234 lb (106.1 kg)  Height: '5\' 7"'$  (1.702 m)     Body mass index is 36.65 kg/m.  Physical Exam:   Physical Exam Vitals and nursing note reviewed.  Constitutional:      General: She is not in acute distress.    Appearance: She is well-developed. She is not ill-appearing or toxic-appearing.  Cardiovascular:     Rate and Rhythm: Normal rate and regular rhythm.     Pulses: Normal pulses.     Heart sounds: Normal heart sounds, S1 normal and S2 normal.  Pulmonary:     Effort: Pulmonary effort is normal.     Breath  sounds: Normal breath sounds.  Skin:    General: Skin is warm and dry.  Neurological:     General: No focal deficit present.     Mental Status: She is alert.     GCS: GCS eye subscore is 4. GCS verbal subscore is 5. GCS motor subscore is 6.     Cranial Nerves: Cranial nerves 2-12 are intact.     Sensory: Sensation is intact.     Motor: Motor function is intact.     Coordination: Coordination is intact.     Gait: Gait is intact.  Psychiatric:        Speech: Speech normal.        Behavior: Behavior normal. Behavior is cooperative.     Assessment and Plan:   Recurrent falls while walking Unclear etiology I discussed that she needs to get in with her neurologist, this may take of few weeks to a month depending on their availability She is able to see Dr. [name] today so we will defer to him if patient needs any sort of workup including blood work or imaging given consistent right foot issues Suspect that she would benefit from physical therapy, not sure if this is available where she lives Consider close follow-up with neurology, based on report of Dr. Clovis Riley recommendation  Muscle twitching Unclear etiology Likely needs to get in with her neurologist to follow-up on this We did discuss increasing her gabapentin to maybe 200 mg twice daily to see if this helps her symptoms I have placed another referral for her to follow-up with neurology for  this  Coronary artery disease involving native coronary artery of native heart without angina pectoris Most recent cardiology note reviewed Continue management per cardiology  Essential hypertension Blood pressure is great Management per cardiology  Follow-up in 6 months for comprehensive  I,Alexander Ruley,acting as a scribe for [employer], PA.,have documented all relevant documentation on the behalf of Inda Coke, PA,as directed by  Inda Coke, PA while in the presence of Inda Coke, [place].  I, Inda Coke,  [place], have reviewed all documentation for this visit. The documentation on 12/11/22 for the exam, diagnosis, procedures, and orders are all accurate and complete.  Inda Coke, PA-C

## 2022-12-11 NOTE — Progress Notes (Signed)
I, Peterson Lombard, LAT, ATC acting as a scribe for [name], MD.  Crystal Bean is a 47 y.o. female who presents to [place] at [facility] today for recurrent falls and balance problems. Pt was previously seen by Dr. Georgina Snell on 09/11/22 for chronic bilat LBP. Today, pt reports she has suffered 4 falls over the last 6 months. Pt notes she is constantly tripping on her R foot. Pt also c/o "twitching" or "jumping" in the muscles primarily the L thigh, which results in increased anxiety.  Dx imaging: 05/26/22 L-spine MRI  Pertinent review of systems: No fevers or chills.  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  On Plavix until February due to a drug-eluting stent  Exam:  BP 102/70   Pulse (!) 59   Ht '5\' 7"'$  (1.702 m)   Wt 234 lb (106.1 kg)   LMP 11/30/2022 (Exact Date)   SpO2 96%   BMI 36.65 kg/m  General: Well Developed, well nourished, and in no acute distress.   MSK: L-spine normal appearing Nontender to palpation spinal midline. Normal lumbar motion. Lower extremity sensation and reflexes are equal and normal throughout. Lower extremity strength slight reduced strength right knee extension 4+/5.  Otherwise lower extremity strength is intact and equal 5/5 bilaterally.      Lab and Radiology Results   EXAM: MRI LUMBAR SPINE WITHOUT CONTRAST   TECHNIQUE: Multiplanar, multisequence MR imaging of the lumbar spine was performed. No intravenous contrast was administered.   COMPARISON:  Radiographs December 23, 2018.   FINDINGS: Segmentation:  Standard.   Alignment:  Small retrolisthesis at L2-3, L3-4 and L5-S1.   Vertebrae:  No fracture, evidence of discitis, or bone lesion.   Conus medullaris and cauda equina: Conus extends to the L1-2 level. Conus and cauda equina appear normal.    Paraspinal and other soft tissues: Cholelithiasis. A 2.4 cm low simple left renal cyst.   Disc levels:   T12-L1 no spinal canal or neural foraminal stenosis.   L1-2: No spinal canal or neural foraminal stenosis.   L2-3: Shallow disc bulge and mild facet degenerative changes with trace left joint effusion. No significant spinal canal or neural foraminal stenosis.   L3-4: Disc bulge, mild facet degenerative changes with trace bilateral joint effusion and mild ligamentum flavum redundancy resulting in mild spinal canal stenosis, moderate right and mild left neural foraminal narrowing.   L4-5: Disc bulge, moderate facet degenerative changes with ligamentum flavum redundancy resulting in mild spinal canal mild narrowing of the bilateral subarticular zones. No significant neural foraminal narrowing.   L5-S1: Loss of disc height, disc bulge with superimposed left central disc protrusion, mild facet degenerative changes and ligamentum flavum redundancy. Findings result in mild right and moderate left subarticular zone stenosis, mild right and moderate left neural foraminal narrowing.   IMPRESSION: 1. At L3-4, moderate right and mild left neural foraminal narrowing. 2. At L4-5, mild spinal canal stenosis with mild narrowing of the bilateral subarticular zones. 3. At L5-S1, mild right and moderate left subarticular zone stenosis, mild right and moderate left neural foraminal narrowing.     Electronically Signed   By: Pedro Earls M.D.   On: 05/28/2022 07:42   I, [name], personally (independently) visualized and performed the interpretation of the images attached in this note.    Assessment and Plan: 47 y.o. female  with  Frequent falls and tripping over the last several months.  On physical exam today she has some very subtle weakness to knee extension that I think is likely due to right L3 nerve irritation or compression seen on lumbar spine MRI July of  2023. Additionally she has some left anterior thigh twitching which would correspond to the same nerve root on the left which also has some nerve root compression.  We discussed treatment options.  Plan for evaluation of the muscle twitching with labs listed below TSH CK metabolic panel and magnesium.  I expect these labs will very likely be normal and next step from there should be an epidural steroid injection targeting L3-4 level.  This cannot really be done until February unless there is an emergency due to Plavix.  She is okay waiting.  She will let me know when she is ready to order the epidural steroid injection.   PDMP not reviewed this encounter. Orders Placed This Encounter  Procedures   TSH   CK (Creatine Kinase)   Comprehensive metabolic panel    Standing Status:   Future    Number of Occurrences:   1    Standing Expiration Date:   12/12/2023   Magnesium   No orders of the defined types were placed in this encounter.    Discussed warning signs or symptoms. Please see discharge instructions. Patient expresses understanding.   The above documentation has been reviewed and is accurate and complete [name], M.D.

## 2022-12-11 NOTE — Patient Instructions (Signed)
Thank you for coming in today.   We can do an epidural steroid injection once you are off plavix.   Let me know.   Please get labs today before you leave   I think your falling is because your thigh muscle is a little weak because you have a pinched nerve in your back. I think that could be causing the twitching  as well.   It all could be due to the L3 nerves getting pinched.

## 2022-12-12 NOTE — Progress Notes (Signed)
Labs are normal.

## 2022-12-24 ENCOUNTER — Telehealth: Payer: Self-pay | Admitting: Physician Assistant

## 2022-12-24 ENCOUNTER — Other Ambulatory Visit: Payer: Self-pay

## 2022-12-24 MED ORDER — AMPHETAMINE-DEXTROAMPHET ER 10 MG PO CP24
10.0000 mg | ORAL_CAPSULE | Freq: Two times a day (BID) | ORAL | 0 refills | Status: DC
Start: 2022-12-24 — End: 2023-01-23

## 2022-12-24 NOTE — Telephone Encounter (Signed)
Pt lvm that she needs a refill on her adderall xr 10 mg. Pharmacy is [facility] in [place]

## 2022-12-24 NOTE — Telephone Encounter (Signed)
Pended.

## 2023-01-06 ENCOUNTER — Ambulatory Visit: Payer: Medicare Other | Admitting: Physician Assistant

## 2023-01-07 ENCOUNTER — Encounter: Payer: Self-pay | Admitting: Physician Assistant

## 2023-01-08 ENCOUNTER — Other Ambulatory Visit: Payer: Self-pay | Admitting: Physician Assistant

## 2023-01-08 MED ORDER — DICLOFENAC SODIUM 75 MG PO TBEC: 75.0000 mg | DELAYED_RELEASE_TABLET | Freq: Two times a day (BID) | ORAL | 0 refills | Status: DC

## 2023-01-11 ENCOUNTER — Other Ambulatory Visit: Payer: Self-pay | Admitting: Physician Assistant

## 2023-01-14 NOTE — Telephone Encounter (Signed)
Pt requesting refill for Gabapentin 100 mg. Last OV 12/11/2022.

## 2023-01-20 ENCOUNTER — Other Ambulatory Visit: Payer: Self-pay | Admitting: Physician Assistant

## 2023-01-21 MED ORDER — DICLOFENAC SODIUM 75 MG PO TBEC: 75.0000 mg | DELAYED_RELEASE_TABLET | Freq: Two times a day (BID) | ORAL | 0 refills | Status: DC

## 2023-01-23 ENCOUNTER — Other Ambulatory Visit: Payer: Self-pay | Admitting: Physician Assistant

## 2023-01-23 ENCOUNTER — Telehealth: Payer: Self-pay | Admitting: Physician Assistant

## 2023-01-23 MED ORDER — AMPHETAMINE-DEXTROAMPHET ER 10 MG PO CP24
10.0000 mg | ORAL_CAPSULE | Freq: Two times a day (BID) | ORAL | 0 refills | Status: DC
Start: 2023-01-23 — End: 2023-03-05

## 2023-01-23 NOTE — Telephone Encounter (Signed)
Rx sent 

## 2023-01-23 NOTE — Telephone Encounter (Signed)
Patient lvm at 11:59 requesting a refill on the Adderall. She is requesting Rx sent to [facility] in [place].It is in stock per patient.  Appointment 02/10/23 Contact # [phone]

## 2023-02-06 ENCOUNTER — Ambulatory Visit: Payer: Medicare Other | Admitting: Physician Assistant

## 2023-02-10 ENCOUNTER — Ambulatory Visit: Payer: Medicare Other | Admitting: Physician Assistant

## 2023-02-10 ENCOUNTER — Ambulatory Visit (INDEPENDENT_AMBULATORY_CARE_PROVIDER_SITE_OTHER): Payer: Medicare Other | Admitting: Physician Assistant

## 2023-02-10 ENCOUNTER — Encounter: Payer: Self-pay | Admitting: Physician Assistant

## 2023-02-10 DIAGNOSIS — F319 Bipolar disorder, unspecified: Secondary | ICD-10-CM

## 2023-02-10 DIAGNOSIS — F902 Attention-deficit hyperactivity disorder, combined type: Secondary | ICD-10-CM

## 2023-02-10 DIAGNOSIS — F411 Generalized anxiety disorder: Secondary | ICD-10-CM | POA: Diagnosis not present

## 2023-02-10 MED ORDER — BUPROPION HCL ER (XL) 300 MG PO TB24: 300.0000 mg | ORAL_TABLET | Freq: Every day | ORAL | 3 refills | Status: DC

## 2023-02-10 MED ORDER — ARIPIPRAZOLE 5 MG PO TABS: 7.5000 mg | ORAL_TABLET | Freq: Every day | ORAL | 1 refills | Status: DC

## 2023-02-10 NOTE — Progress Notes (Signed)
[facility] Check  Patient ID: Crystal Bean,  MRN: [id number]  PCP: Crystal Coke, PA  Date of Evaluation: 02/10/2023. Time spent:30 minutes  Chief Complaint:  Chief Complaint   Anxiety; Depression; Follow-up    HISTORY/CURRENT STATUS: HPI For routine med check.  She ask about the possibility of decreasing or getting off of 1 or more of her medications.  She is doing well and says "I know I am medicated and may be doing fine because of that but if there is something I might be able to decrease or get rid of I would like to."  At some point when different medications were added she started having brain fog but not sure when that happened and it is not affecting her day-to-day life.  She just forgets what she went into her room for her things like that.  Patient is able to enjoy things.  Energy and motivation are good.   No extreme sadness, tearfulness, or feelings of hopelessness.  Sleeps well most of the time. ADLs and personal hygiene are normal.   Denies any changes in concentration, making decisions, or remembering things.  Appetite has not changed.  Weight is stable.  Rarely has anxiety.  Denies suicidal or homicidal thoughts.  Patient denies increased energy with decreased need for sleep, increased talkativeness, racing thoughts, impulsivity or risky behaviors, increased spending, increased libido, grandiosity, increased irritability or anger, paranoia, or hallucinations.  Denies dizziness, syncope, seizures, numbness, tingling, tremor, tics, unsteady gait, slurred speech, confusion. Denies muscle or joint pain, stiffness, or dystonia. Denies unexplained weight loss, frequent infections, or sores that heal slowly.  No polyphagia, polydipsia, or polyuria. Denies visual changes or paresthesias.   Individual Medical History/ Review of Systems: Changes? :No    Past medications for mental health diagnoses include: Prozac, Celexa, Lexapro, Wellbutrin, Effexor XR, Pristiq,  Abilify,  Adderall, Ritalin, Klonopin, Xanax, Gabapentin, Buspar  Allergies: Gabapentin, Prednisone, Meloxicam, and Penicillins  Current Medications:  Current Outpatient Medications:    amphetamine-dextroamphetamine (ADDERALL XR) 10 MG 24 hr capsule, Take 1 capsule (10 mg total) by mouth 2 (two) times daily with breakfast and lunch., Disp: 60 capsule, Rfl: 0   ARIPiprazole (ABILIFY) 5 MG tablet, Take 1.5 tablets (7.5 mg total) by mouth daily., Disp: 45 tablet, Rfl: 1   aspirin EC 81 MG EC tablet, Take 1 tablet (81 mg total) by mouth daily. Swallow whole., Disp: 30 tablet, Rfl: 11   Cholecalciferol (VITAMIN D3) 10 MCG (400 UNIT) CAPS, Take by mouth., Disp: , Rfl:    Cyanocobalamin (VITAMIN B-12 PO), Take by mouth., Disp: , Rfl:    diclofenac (VOLTAREN) 75 MG EC tablet, Take 1 tablet (75 mg total) by mouth 2 (two) times daily., Disp: 60 tablet, Rfl: 0   esomeprazole (NEXIUM) 20 MG capsule, Take 20 mg by mouth daily at 12 noon., Disp: , Rfl:    gabapentin (NEURONTIN) 100 MG capsule, TAKE 1 CAPSULE BY MOUTH THREE TIMES DAILY, Disp: 90 capsule, Rfl: 1   ketoconazole (NIZORAL) 2 % cream, APPLY TO AFFECTED AREA(S) ONCE TO TWICE DAILY, Disp: 30 g, Rfl: 0   lisinopril (ZESTRIL) 2.5 MG tablet, Take 1 tablet by mouth once daily, Disp: 90 tablet, Rfl: 3   metoprolol succinate (TOPROL-XL) 25 MG 24 hr tablet, Take 1 tablet by mouth once daily, Disp: 90 tablet, Rfl: 3   Multiple Vitamin (MULTIVITAMIN) tablet, Take 1 tablet by mouth daily., Disp: , Rfl:    Omega-3 Fatty Acids (FISH OIL PO), Take by mouth., Disp: , Rfl:  rosuvastatin (CRESTOR) 40 MG tablet, Take 1 tablet by mouth once daily, Disp: 90 tablet, Rfl: 3   Semaglutide,0.25 or 0.5MG /DOS, (OZEMPIC, 0.25 OR 0.5 MG/DOSE,) 2 MG/3ML SOPN, Inject into the skin., Disp: , Rfl:    triamcinolone ointment (KENALOG) 0.5 %, Apply to affected area 1-2 times daily, Disp: 15 g, Rfl: 0   venlafaxine XR (EFFEXOR-XR) 150 MG 24 hr capsule, Take 2 capsules (300 mg  total) by mouth daily with breakfast., Disp: 180 capsule, Rfl: 3   buPROPion (WELLBUTRIN XL) 300 MG 24 hr tablet, Take 1 tablet (300 mg total) by mouth daily., Disp: 90 tablet, Rfl: 3   clopidogrel (PLAVIX) 75 MG tablet, Take 1 tablet (75 mg total) by mouth daily. (Patient not taking: Reported on 02/10/2023), Disp: 90 tablet, Rfl: 3   metFORMIN (GLUCOPHAGE-XR) 500 MG 24 hr tablet, TAKE 2 TABLETS BY MOUTH ONCE DAILY WITH BREAKFAST (Patient not taking: Reported on 02/10/2023), Disp: 120 tablet, Rfl: 0   nitroGLYCERIN (NITROSTAT) 0.4 MG SL tablet, Place 1 tablet (0.4 mg total) under the tongue every 5 (five) minutes x 3 doses as needed for chest pain. (Patient not taking: Reported on 02/10/2023), Disp: 25 tablet, Rfl: 12 Medication Side Effects: none  Family Medical/ Social History: Changes? She and husband moved to [place], in [place]:  There were no vitals taken for this visit.There is no height or weight on file to calculate BMI.  General Appearance: Casual, Well Groomed and Obese  Eye Contact:  Good  Speech:  Clear and Coherent and Normal Rate  Volume:  Normal  Mood:  Euthymic  Affect:  Congruent  Thought Process:  Goal Directed and Descriptions of Associations: Circumstantial  Orientation:  Full (Time, Place, and Person)  Thought Content: Logical   Suicidal Thoughts:  No  Homicidal Thoughts:  No  Memory:  WNL  Judgement:  Good  Insight:  Good  Psychomotor Activity:  Normal  Concentration:  Concentration: Good and Attention Span: Fair  Recall:  Good  Fund of Knowledge: Good  Language: Good  Assets:  Communication Skills Desire for Improvement Financial Resources/Insurance Housing Transportation  ADL's:  Intact  Cognition: WNL  Prognosis:  Good   Labs 12/11/2022 CMP glucose 87 all other values also normal. Magnesium 2.0 TSH 1.79 Creatine kinase 91 No recent lipid panel or CBC noted  DIAGNOSES:    ICD-10-CM   1. Bipolar I disorder ([place])  F31.9      2. Attention deficit hyperactivity disorder (ADHD), combined type  F90.2     3. Generalized anxiety disorder  F41.1      Receiving Psychotherapy: Yes  Leane Platt   RECOMMENDATIONS:  PDMP was reviewed.  Last Adderall filled 01/24/2023. I provided 30 minutes of face to face time during this encounter, including time spent before and after the visit in records review, medical decision making, counseling pertinent to today's visit, and charting.   Discussed decreasing Abilify.  She was only diagnosed with bipolar disorder a few years ago, and not really sure if she has it or not.  States she needed it at the time and it was beneficial.  I recommend decreasing that because of the weight gain, possibility of increasing lipids and glucose.  She would like to try it. No other changes will be made.  Continue Xanax 1 mg, 1/2-1 po bid prn. Use sparingly.  (She has an old bottle, not listed on the med list.) Continue Adderall XR 10 mg, 1 p.o. at breakfast and 1  p.o. at lunch. Decrease Abilify to 5 mg, 1.5 pills daily for 1 month, then may go to 5 mg.  Continue Wellbutrin XL 300 mg, 1 p.o. daily. Continue gabapentin 100 mg, 1-3 daily as directed. Continue Effexor XR 150 mg, 2 p.o. every morning. Continue therapy. Return in 4 weeks.  Donnal Moat, PA-C

## 2023-02-18 ENCOUNTER — Ambulatory Visit (INDEPENDENT_AMBULATORY_CARE_PROVIDER_SITE_OTHER): Payer: Medicare Other

## 2023-02-18 VITALS — BP 118/74 | Wt 230.8 lb

## 2023-02-18 DIAGNOSIS — Z0000 Encounter for general adult medical examination without abnormal findings: Secondary | ICD-10-CM | POA: Diagnosis not present

## 2023-02-18 NOTE — Patient Instructions (Signed)
Crystal Bean , Thank you for taking time to come for your Medicare Wellness Visit. I appreciate your ongoing commitment to your health goals. Please review the following plan we discussed and let me know if I can assist you in the future.   These are the goals we discussed:  Goals      Patient Stated     Get down to 200lbs      Patient Stated     Lose weight         This is a list of the screening recommended for you and due dates:  Health Maintenance  Topic Date Due   Hepatitis C Screening: USPSTF Recommendation to screen - Ages 19-79 yo.  Never done   Colon Cancer Screening  Never done   DTaP/Tdap/Td vaccine (2 - Td or Tdap) 02/13/2023   Pap Smear  12/12/2023*   COVID-19 Vaccine (5 - 2023-24 season) 12/12/2023*   Mammogram  09/12/2023   Medicare Annual Wellness Visit  02/18/2024   HIV Screening  Completed   HPV Vaccine  Aged Out   Flu Shot  Discontinued  *Topic was postponed. The date shown is not the original due date.    Advanced directives: Advance directive discussed with you today. I have provided a copy for you to complete at home and have notarized. Once this is complete please bring a copy in to our office so we can scan it into your chart.  Conditions/risks identified: more exercise   Next appointment: Follow up in one year for your annual wellness visit.   Preventive Care 40-64 Years, Female Preventive care refers to lifestyle choices and visits with your health care provider that can promote health and wellness. What does preventive care include? A yearly physical exam. This is also called an annual well check. Dental exams once or twice a year. Routine eye exams. Ask your health care provider how often you should have your eyes checked. Personal lifestyle choices, including: Daily care of your teeth and gums. Regular physical activity. Eating a healthy diet. Avoiding tobacco and drug use. Limiting alcohol use. Practicing safe sex. Taking low-dose aspirin  daily starting at age 7. Taking vitamin and mineral supplements as recommended by your health care provider. What happens during an annual well check? The services and screenings done by your health care provider during your annual well check will depend on your age, overall health, lifestyle risk factors, and family history of disease. Counseling  Your health care provider may ask you questions about your: Alcohol use. Tobacco use. Drug use. Emotional well-being. Home and relationship well-being. Sexual activity. Eating habits. Work and work Statistician. Method of birth control. Menstrual cycle. Pregnancy history. Screening  You may have the following tests or measurements: Height, weight, and BMI. Blood pressure. Lipid and cholesterol levels. These may be checked every 5 years, or more frequently if you are over 60 years old. Skin check. Lung cancer screening. You may have this screening every year starting at age 14 if you have a 30-pack-year history of smoking and currently smoke or have quit within the past 15 years. Fecal occult blood test (FOBT) of the stool. You may have this test every year starting at age 31. Flexible sigmoidoscopy or colonoscopy. You may have a sigmoidoscopy every 5 years or a colonoscopy every 10 years starting at age 66. Hepatitis C blood test. Hepatitis B blood test. Sexually transmitted disease (STD) testing. Diabetes screening. This is done by checking your blood sugar (glucose) after you have  not eaten for a while (fasting). You may have this done every 1-3 years. Mammogram. This may be done every 1-2 years. Talk to your health care provider about when you should start having regular mammograms. This may depend on whether you have a family history of breast cancer. BRCA-related cancer screening. This may be done if you have a family history of breast, ovarian, tubal, or peritoneal cancers. Pelvic exam and Pap test. This may be done every 3 years  starting at age 23. Starting at age 66, this may be done every 5 years if you have a Pap test in combination with an HPV test. Bone density scan. This is done to screen for osteoporosis. You may have this scan if you are at high risk for osteoporosis. Discuss your test results, treatment options, and if necessary, the need for more tests with your health care provider. Vaccines  Your health care provider may recommend certain vaccines, such as: Influenza vaccine. This is recommended every year. Tetanus, diphtheria, and acellular pertussis (Tdap, Td) vaccine. You may need a Td booster every 10 years. Zoster vaccine. You may need this after age 33. Pneumococcal 13-valent conjugate (PCV13) vaccine. You may need this if you have certain conditions and were not previously vaccinated. Pneumococcal polysaccharide (PPSV23) vaccine. You may need one or two doses if you smoke cigarettes or if you have certain conditions. Talk to your health care provider about which screenings and vaccines you need and how often you need them. This information is not intended to replace advice given to you by your health care provider. Make sure you discuss any questions you have with your health care provider. Document Released: 12/01/2015 Document Revised: 07/24/2016 Document Reviewed: 09/05/2015 Elsevier Interactive Patient Education  2017 [place] Prevention in the Home Falls can cause injuries. They can happen to people of all ages. There are many things you can do to make your home safe and to help prevent falls. What can I do on the outside of my home? Regularly fix the edges of walkways and driveways and fix any cracks. Remove anything that might make you trip as you walk through a door, such as a raised step or threshold. Trim any bushes or trees on the path to your home. Use bright outdoor lighting. Clear any walking paths of anything that might make someone trip, such as rocks or  tools. Regularly check to see if handrails are loose or broken. Make sure that both sides of any steps have handrails. Any raised decks and porches should have guardrails on the edges. Have any leaves, snow, or ice cleared regularly. Use sand or salt on walking paths during winter. Clean up any spills in your garage right away. This includes oil or grease spills. What can I do in the bathroom? Use night lights. Install grab bars by the toilet and in the tub and shower. Do not use towel bars as grab bars. Use non-skid mats or decals in the tub or shower. If you need to sit down in the shower, use a plastic, non-slip stool. Keep the floor dry. Clean up any water that spills on the floor as soon as it happens. Remove soap buildup in the tub or shower regularly. Attach bath mats securely with double-sided non-slip rug tape. Do not have throw rugs and other things on the floor that can make you trip. What can I do in the bedroom? Use night lights. Make sure that you have a light by your  bed that is easy to reach. Do not use any sheets or blankets that are too big for your bed. They should not hang down onto the floor. Have a firm chair that has side arms. You can use this for support while you get dressed. Do not have throw rugs and other things on the floor that can make you trip. What can I do in the kitchen? Clean up any spills right away. Avoid walking on wet floors. Keep items that you use a lot in easy-to-reach places. If you need to reach something above you, use a strong step stool that has a grab bar. Keep electrical cords out of the way. Do not use floor polish or wax that makes floors slippery. If you must use wax, use non-skid floor wax. Do not have throw rugs and other things on the floor that can make you trip. What can I do with my stairs? Do not leave any items on the stairs. Make sure that there are handrails on both sides of the stairs and use them. Fix handrails that are  broken or loose. Make sure that handrails are as long as the stairways. Check any carpeting to make sure that it is firmly attached to the stairs. Fix any carpet that is loose or worn. Avoid having throw rugs at the top or bottom of the stairs. If you do have throw rugs, attach them to the floor with carpet tape. Make sure that you have a light switch at the top of the stairs and the bottom of the stairs. If you do not have them, ask someone to add them for you. What else can I do to help prevent falls? Wear shoes that: Do not have high heels. Have rubber bottoms. Are comfortable and fit you well. Are closed at the toe. Do not wear sandals. If you use a stepladder: Make sure that it is fully opened. Do not climb a closed stepladder. Make sure that both sides of the stepladder are locked into place. Ask someone to hold it for you, if possible. Clearly mark and make sure that you can see: Any grab bars or handrails. First and last steps. Where the edge of each step is. Use tools that help you move around (mobility aids) if they are needed. These include: Canes. Walkers. Scooters. Crutches. Turn on the lights when you go into a dark area. Replace any light bulbs as soon as they burn out. Set up your furniture so you have a clear path. Avoid moving your furniture around. If any of your floors are uneven, fix them. If there are any pets around you, be aware of where they are. Review your medicines with your doctor. Some medicines can make you feel dizzy. This can increase your chance of falling. Ask your doctor what other things that you can do to help prevent falls. This information is not intended to replace advice given to you by your health care provider. Make sure you discuss any questions you have with your health care provider. Document Released: 08/31/2009 Document Revised: 04/11/2016 Document Reviewed: 12/09/2014 Elsevier Interactive Patient Education  2017 [employer].
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
## 2023-02-22 ENCOUNTER — Other Ambulatory Visit: Payer: Self-pay | Admitting: Physician Assistant

## 2023-02-24 MED ORDER — DICLOFENAC SODIUM 75 MG PO TBEC: 75.0000 mg | DELAYED_RELEASE_TABLET | Freq: Two times a day (BID) | ORAL | 0 refills | Status: DC

## 2023-03-05 ENCOUNTER — Telehealth: Payer: Self-pay | Admitting: Physician Assistant

## 2023-03-05 ENCOUNTER — Other Ambulatory Visit: Payer: Self-pay | Admitting: Physician Assistant

## 2023-03-05 MED ORDER — AMPHETAMINE-DEXTROAMPHET ER 10 MG PO CP24: 10.0000 mg | ORAL_CAPSULE | Freq: Two times a day (BID) | ORAL | 0 refills | Status: DC

## 2023-03-05 NOTE — Telephone Encounter (Signed)
Pt lvm that she needs a refill on her adderall xr 10 mg. Pharmacy is [facility] located at [address] in [place]. Phone number is [phone]

## 2023-03-06 NOTE — Telephone Encounter (Signed)
Sent on 03/05/2023

## 2023-03-11 ENCOUNTER — Telehealth (INDEPENDENT_AMBULATORY_CARE_PROVIDER_SITE_OTHER): Payer: Medicare Other | Admitting: Physician Assistant

## 2023-03-11 ENCOUNTER — Encounter: Payer: Self-pay | Admitting: Physician Assistant

## 2023-03-11 DIAGNOSIS — F3341 Major depressive disorder, recurrent, in partial remission: Secondary | ICD-10-CM

## 2023-03-11 DIAGNOSIS — F4310 Post-traumatic stress disorder, unspecified: Secondary | ICD-10-CM

## 2023-03-11 DIAGNOSIS — F422 Mixed obsessional thoughts and acts: Secondary | ICD-10-CM

## 2023-03-11 DIAGNOSIS — F411 Generalized anxiety disorder: Secondary | ICD-10-CM | POA: Diagnosis not present

## 2023-03-11 DIAGNOSIS — F902 Attention-deficit hyperactivity disorder, combined type: Secondary | ICD-10-CM

## 2023-03-11 NOTE — Progress Notes (Signed)
[facility] Med Check  Patient ID: Crystal Bean,  MRN: [id number]  PCP: Jarold Motto, PA  Date of Evaluation: 03/11/2023. Time spent:20 minutes  Chief Complaint:  Chief Complaint   Follow-up   Virtual Visit via Telehealth  I connected with patient by a video enabled telemedicine application with their informed consent, and verified patient privacy and that I am speaking with the correct person using two identifiers.  I am private, in my office and the patient is at home.  I discussed the limitations, risks, security and privacy concerns of performing an evaluation and management service by video and the availability of in person appointments. I also discussed with the patient that there may be a patient responsible charge related to this service. The patient expressed understanding and agreed to proceed.   I discussed the assessment and treatment plan with the patient. The patient was provided an opportunity to ask questions and all were answered. The patient agreed with the plan and demonstrated an understanding of the instructions.   The patient was advised to call back or seek an in-person evaluation if the symptoms worsen or if the condition fails to improve as anticipated.  I provided 20  minutes of non-face-to-face time during this encounter.  HISTORY/CURRENT STATUS: HPI For routine med check.  A month ago we decreased Abilify.  We are hoping it well help decrease hunger and help her lose more weight.  States she feels really good, even better being off of it.  Is on Ozempic and seeing weight loss clinic.  Does not have a sense of "fogginess" that she did.  She would like to continue to wean off if possible.  Patient is able to enjoy things.  Energy and motivation are good.  No extreme sadness, tearfulness, or feelings of hopelessness.  Sleeps well most of the time. ADLs and personal hygiene are normal.   Denies any changes in concentration, making decisions, or  remembering things.  Appetite has not changed.  Denies suicidal or homicidal thoughts.  Patient denies increased energy with decreased need for sleep, increased talkativeness, racing thoughts, impulsivity or risky behaviors, increased spending, increased libido, grandiosity, increased irritability or anger, paranoia, or hallucinations.  Denies dizziness, syncope, seizures, numbness, tingling, tremor, tics, unsteady gait, slurred speech, confusion. Denies muscle or joint pain, stiffness, or dystonia. Denies unexplained weight loss, frequent infections, or sores that heal slowly.  No polyphagia, polydipsia, or polyuria. Denies visual changes or paresthesias.   Individual Medical History/ Review of Systems: Changes? :No    Past medications for mental health diagnoses include: Prozac, Celexa, Lexapro, Wellbutrin, Effexor XR, Pristiq, Abilify,  Adderall, Ritalin, Klonopin, Xanax, Gabapentin, Buspar  Allergies: Gabapentin, Prednisone, Meloxicam, and Penicillins  Current Medications:  Current Outpatient Medications:    amphetamine-dextroamphetamine (ADDERALL XR) 10 MG 24 hr capsule, Take 1 capsule (10 mg total) by mouth 2 (two) times daily with breakfast and lunch., Disp: 60 capsule, Rfl: 0   ARIPiprazole (ABILIFY) 5 MG tablet, Take 1.5 tablets (7.5 mg total) by mouth daily., Disp: 45 tablet, Rfl: 1   aspirin EC 81 MG EC tablet, Take 1 tablet (81 mg total) by mouth daily. Swallow whole., Disp: 30 tablet, Rfl: 11   buPROPion (WELLBUTRIN XL) 300 MG 24 hr tablet, Take 1 tablet (300 mg total) by mouth daily., Disp: 90 tablet, Rfl: 3   Cholecalciferol (VITAMIN D3) 10 MCG (400 UNIT) CAPS, Take by mouth., Disp: , Rfl:    Cyanocobalamin (VITAMIN B-12 PO), Take by mouth., Disp: , Rfl:  diclofenac (VOLTAREN) 75 MG EC tablet, Take 1 tablet (75 mg total) by mouth 2 (two) times daily., Disp: 60 tablet, Rfl: 0   esomeprazole (NEXIUM) 20 MG capsule, Take 20 mg by mouth daily at 12 noon., Disp: , Rfl:     gabapentin (NEURONTIN) 100 MG capsule, TAKE 1 CAPSULE BY MOUTH THREE TIMES DAILY, Disp: 90 capsule, Rfl: 1   ketoconazole (NIZORAL) 2 % cream, APPLY TO AFFECTED AREA(S) ONCE TO TWICE DAILY, Disp: 30 g, Rfl: 0   lisinopril (ZESTRIL) 2.5 MG tablet, Take 1 tablet by mouth once daily, Disp: 90 tablet, Rfl: 3   metoprolol succinate (TOPROL-XL) 25 MG 24 hr tablet, Take 1 tablet by mouth once daily, Disp: 90 tablet, Rfl: 3   Multiple Vitamin (MULTIVITAMIN) tablet, Take 1 tablet by mouth daily., Disp: , Rfl:    nitroGLYCERIN (NITROSTAT) 0.4 MG SL tablet, Place 1 tablet (0.4 mg total) under the tongue every 5 (five) minutes x 3 doses as needed for chest pain. (Patient taking differently: Place 0.4 mg under the tongue every 5 (five) minutes x 3 doses as needed for chest pain. On hand), Disp: 25 tablet, Rfl: 12   Omega-3 Fatty Acids (FISH OIL PO), Take by mouth., Disp: , Rfl:    rosuvastatin (CRESTOR) 40 MG tablet, Take 1 tablet by mouth once daily, Disp: 90 tablet, Rfl: 3   Semaglutide,0.25 or 0.5MG /DOS, (OZEMPIC, 0.25 OR 0.5 MG/DOSE,) 2 MG/3ML SOPN, Inject into the skin., Disp: , Rfl:    triamcinolone ointment (KENALOG) 0.5 %, Apply to affected area 1-2 times daily, Disp: 15 g, Rfl: 0   venlafaxine XR (EFFEXOR-XR) 150 MG 24 hr capsule, Take 2 capsules (300 mg total) by mouth daily with breakfast., Disp: 180 capsule, Rfl: 3   metFORMIN (GLUCOPHAGE-XR) 500 MG 24 hr tablet, TAKE 2 TABLETS BY MOUTH ONCE DAILY WITH BREAKFAST (Patient not taking: Reported on 02/18/2023), Disp: 120 tablet, Rfl: 0 Medication Side Effects: none  Family Medical/ Social History: Changes?   MENTAL HEALTH EXAM:  There were no vitals taken for this visit.There is no height or weight on file to calculate BMI.  General Appearance: Casual, Well Groomed and Obese  Eye Contact:  Good  Speech:  Clear and Coherent and Normal Rate  Volume:  Normal  Mood:  Euthymic  Affect:  Congruent  Thought Process:  Goal Directed and Descriptions of  Associations: Circumstantial  Orientation:  Full (Time, Place, and Person)  Thought Content: Logical   Suicidal Thoughts:  No  Homicidal Thoughts:  No  Memory:  WNL  Judgement:  Good  Insight:  Good  Psychomotor Activity:  Normal  Concentration:  Concentration: Good and Attention Span: Good  Recall:  Good  Fund of Knowledge: Good  Language: Good  Assets:  Desire for Improvement Financial Resources/Insurance Housing Transportation  ADL's:  Intact  Cognition: WNL  Prognosis:  Good   DIAGNOSES:    ICD-10-CM   1. Recurrent major depressive disorder, in partial remission  F33.41     2. Attention deficit hyperactivity disorder (ADHD), combined type  F90.2     3. Generalized anxiety disorder  F41.1     4. PTSD (post-traumatic stress disorder)  F43.10     5. Mixed obsessional thoughts and acts  F42.2       Receiving Psychotherapy: Yes  Alba Cory   RECOMMENDATIONS:  PDMP was reviewed.  Last Adderall filled 03/05/2023.  I provided 20 minutes of non-face-to-face time during this encounter, including time spent before and after the visit in records  review, medical decision making, counseling pertinent to today's visit, and charting.   She's doing well with the decrease of Abilify and would like to continue.   Continue Xanax 1 mg, 1/2-1 po bid prn. Use sparingly.  (She has an old bottle, not listed on the med list.) Continue Adderall XR 10 mg, 1 p.o. at breakfast and 1 p.o. at lunch. Decrease Abilify to 5 mg, 1 q am for 2 weeks and then 0.25 mg daily for 2 weeks and then she can stop it if she prefers.  If she needs to stay at either of those doses, that is fine, just let me know so I can send in an adequate supply when refills are due. Continue Wellbutrin XL 300 mg, 1 p.o. daily. Continue gabapentin 100 mg, 1-3 daily as directed. Continue Effexor XR 150 mg, 2 p.o. every morning. Continue therapy. Return in 3 months.  Melony Overly, PA-C

## 2023-03-21 ENCOUNTER — Other Ambulatory Visit: Payer: Self-pay | Admitting: Physician Assistant

## 2023-03-23 ENCOUNTER — Encounter: Payer: Self-pay | Admitting: Physician Assistant

## 2023-04-04 ENCOUNTER — Telehealth: Payer: Self-pay | Admitting: Adult Health

## 2023-04-04 ENCOUNTER — Other Ambulatory Visit: Payer: Self-pay | Admitting: Adult Health

## 2023-04-04 NOTE — Telephone Encounter (Signed)
Teresa's patient

## 2023-04-04 NOTE — Telephone Encounter (Signed)
Pt requesting Rx for generic adderall XR 10 mg 2/d #60 [facility] [address]. [address]

## 2023-04-07 ENCOUNTER — Other Ambulatory Visit: Payer: Self-pay | Admitting: Physician Assistant

## 2023-04-07 MED ORDER — AMPHETAMINE-DEXTROAMPHET ER 10 MG PO CP24: 10.0000 mg | ORAL_CAPSULE | Freq: Two times a day (BID) | ORAL | 0 refills | Status: DC

## 2023-04-07 NOTE — Telephone Encounter (Signed)
sent 

## 2023-04-09 ENCOUNTER — Other Ambulatory Visit: Payer: Self-pay | Admitting: Physician Assistant

## 2023-04-10 ENCOUNTER — Ambulatory Visit (INDEPENDENT_AMBULATORY_CARE_PROVIDER_SITE_OTHER): Payer: Medicare Other | Admitting: Physician Assistant

## 2023-04-10 ENCOUNTER — Ambulatory Visit (INDEPENDENT_AMBULATORY_CARE_PROVIDER_SITE_OTHER)
Admission: RE | Admit: 2023-04-10 | Discharge: 2023-04-10 | Disposition: A | Discharge status: Home / Self Care | Payer: Medicare Other | Source: Ambulatory Visit | Attending: Physician Assistant | Admitting: Physician Assistant

## 2023-04-10 ENCOUNTER — Other Ambulatory Visit: Payer: Self-pay | Admitting: Physician Assistant

## 2023-04-10 ENCOUNTER — Encounter: Payer: Self-pay | Admitting: Physician Assistant

## 2023-04-10 VITALS — BP 100/66 | HR 68 | Temp 97.7°F | Ht 67.0 in | Wt 227.0 lb

## 2023-04-10 DIAGNOSIS — R208 Other disturbances of skin sensation: Secondary | ICD-10-CM

## 2023-04-10 DIAGNOSIS — R7303 Prediabetes: Secondary | ICD-10-CM | POA: Diagnosis not present

## 2023-04-10 DIAGNOSIS — Z1159 Encounter for screening for other viral diseases: Secondary | ICD-10-CM | POA: Diagnosis not present

## 2023-04-10 DIAGNOSIS — R109 Unspecified abdominal pain: Secondary | ICD-10-CM | POA: Diagnosis not present

## 2023-04-10 DIAGNOSIS — W57XXXA Bitten or stung by nonvenomous insect and other nonvenomous arthropods, initial encounter: Secondary | ICD-10-CM

## 2023-04-10 DIAGNOSIS — S30860A Insect bite (nonvenomous) of lower back and pelvis, initial encounter: Secondary | ICD-10-CM

## 2023-04-10 DIAGNOSIS — R82998 Other abnormal findings in urine: Secondary | ICD-10-CM

## 2023-04-10 LAB — COMPREHENSIVE METABOLIC PANEL
ALT: 17 U/L (ref 0–35)
AST: 13 U/L (ref 0–37)
Albumin: 3.5 g/dL (ref 3.5–5.2)
Alkaline Phosphatase: 85 U/L (ref 39–117)
BUN: 9 mg/dL (ref 6–23)
CO2: 29 mEq/L (ref 19–32)
Calcium: 8.9 mg/dL (ref 8.4–10.5)
Chloride: 95 mEq/L — ABNORMAL LOW (ref 96–112)
Creatinine, Ser: 1.24 mg/dL — ABNORMAL HIGH (ref 0.40–1.20)
GFR: 51.83 mL/min — ABNORMAL LOW (ref >60.00)
Glucose, Bld: 89 mg/dL (ref 70–99)
Potassium: 3.5 mEq/L (ref 3.5–5.1)
Sodium: 133 mEq/L — ABNORMAL LOW (ref 135–145)
Total Bilirubin: 0.4 mg/dL (ref 0.2–1.2)
Total Protein: 6.6 g/dL (ref 6.0–8.3)

## 2023-04-10 LAB — TSH: TSH: 2 u[IU]/mL (ref 0.35–5.50)

## 2023-04-10 LAB — CBC WITH DIFFERENTIAL/PLATELET
Basophils Absolute: 0.1 10*3/uL (ref 0.0–0.1)
Basophils Relative: 0.5 % (ref 0.0–3.0)
Eosinophils Absolute: 0.2 10*3/uL (ref 0.0–0.7)
Eosinophils Relative: 1.2 % (ref 0.0–5.0)
HCT: 34 % — ABNORMAL LOW (ref 36.0–46.0)
Hemoglobin: 11.1 g/dL — ABNORMAL LOW (ref 12.0–15.0)
Lymphocytes Relative: 9.7 % — ABNORMAL LOW (ref 12.0–46.0)
Lymphs Abs: 1.7 10*3/uL (ref 0.7–4.0)
MCHC: 32.8 g/dL (ref 30.0–36.0)
MCV: 87.7 fl (ref 78.0–100.0)
Monocytes Absolute: 2.3 10*3/uL — ABNORMAL HIGH (ref 0.1–1.0)
Monocytes Relative: 12.8 % — ABNORMAL HIGH (ref 3.0–12.0)
Neutro Abs: 13.6 10*3/uL — ABNORMAL HIGH (ref 1.4–7.7)
Neutrophils Relative %: 75.8 % (ref 43.0–77.0)
Platelets: 410 10*3/uL — ABNORMAL HIGH (ref 150.0–400.0)
RBC: 3.87 Mil/uL (ref 3.87–5.11)
RDW: 14.8 % (ref 11.5–15.5)
WBC: 17.9 10*3/uL — ABNORMAL HIGH (ref 4.0–10.5)

## 2023-04-10 LAB — CK: Total CK: 68 U/L (ref 7–177)

## 2023-04-10 LAB — HEMOGLOBIN A1C: Hgb A1c MFr Bld: 5.8 % (ref 4.6–6.5)

## 2023-04-10 LAB — URINALYSIS, ROUTINE W REFLEX MICROSCOPIC
Bilirubin Urine: NEGATIVE
Ketones, ur: NEGATIVE
Nitrite: NEGATIVE
Specific Gravity, Urine: 1.02 (ref 1.000–1.030)
Total Protein, Urine: 30 — AB
Urine Glucose: NEGATIVE
Urobilinogen, UA: 0.2 (ref 0.0–1.0)
pH: 6 (ref 5.0–8.0)

## 2023-04-10 LAB — VITAMIN B12: Vitamin B-12: 1196 pg/mL — ABNORMAL HIGH (ref 211–911)

## 2023-04-10 MED ORDER — CEPHALEXIN 500 MG PO CAPS: 500.0000 mg | ORAL_CAPSULE | Freq: Two times a day (BID) | ORAL | 0 refills | Status: DC

## 2023-04-10 NOTE — Patient Instructions (Addendum)
It was great to see you!  For your back pain: - We will update blood work and urine sample - We will get an xray - An order for xray has been put in for you. To have this done, you can walk in at the [facility] location without a scheduled appointment.  The address is [address] It is across the street from [facility]. Lab and x-xray are located in the basement.  Hours of operation are M-F 8:30am to 5:00pm.  Please note that they are closed for lunch between 12:30 and 1:00pm.  For your skin burning: - Will update blood work - If symptoms persist, we will modify your gabapentin regimen  Take care,  Jarold Motto PA-C

## 2023-04-10 NOTE — Progress Notes (Signed)
Crystal Bean is a 47 y.o. female here for a new problem.  History of Present Illness:   Chief Complaint  Patient presents with   Back Pain    Pt c/o right lower side x 2 weeks, hurts when she takes a deep breathe.   skin burning    Pt had episode 2 nights ago and again last night feeling like her skin is burning all over and then pain on right side body, using Gabapentin.    HPI  Back pain:  She complains of right lower back pain and catching sensation with deep breaths. for 2 weeks.  She states symptoms resolved when lying down.  She denies any urinary symptoms or known trauma, no recent upper respiratory infection (URI).  Skin burning: She reports skin pain all throughout starting 2 days ago.  She describes her pain being similar to having sunburn and getting sunburn.  She also reports feeling cold and waking up covered in sweat. She reports her symptoms tend to begin in the late evening and nighttime, and tends to feel better in the morning.  She is taking 300 mg Gabapentin once daily in the morning, which usually works.  Tick bite: She reports a tick bite about a month ago on her lower back.  She states she removed the tick herself.  Denies any rash or hx of tick borne illness.  Prediabetes Takes Ozempic through [facility] Has run out of medication and awaiting fill She has not been on her metformin but would like to start back if needed  Past Medical History:  Diagnosis Date   ADHD (attention deficit hyperactivity disorder)    Anxiety    B12 deficiency    Back pain    Bipolar 1 disorder (HCC)    Bulging lumbar disc    C. difficile diarrhea 04/24/2016   treated with metronidazole   Constipation    Depression    GERD (gastroesophageal reflux disease)    History of chicken pox    History of shingles 2013   Hypertension    IBS (irritable bowel syndrome)    Joint pain    Major depressive disorder    Migraines    Obesity     Obsessive-compulsive disorder    Osteoarthritis    Prediabetes    PTSD (post-traumatic stress disorder)    Sleep apnea    SOB (shortness of breath)      Social History   Tobacco Use   Smoking status: Former    Packs/day: 1.00    Years: 20.00    Additional pack years: 0.00    Total pack years: 20.00    Types: Cigarettes    Quit date: 06/19/2011    Years since quitting: 11.8   Smokeless tobacco: Never  Vaping Use   Vaping Use: Never used  Substance Use Topics   Alcohol use: Not Currently   Drug use: No    Past Surgical History:  Procedure Laterality Date   BLADDER SURGERY  1981   Urethera stretched   caps on teeth  1980   Caps on all Teeth   CESAREAN SECTION  2003   CORONARY STENT INTERVENTION N/A 12/28/2021   Procedure: CORONARY STENT INTERVENTION;  Surgeon: Swaziland, Peter M, MD;  Location: [facility];  Service: Cardiovascular;  Laterality: N/A;   LEFT HEART CATH AND CORONARY ANGIOGRAPHY N/A 12/28/2021   Procedure: LEFT HEART CATH AND CORONARY ANGIOGRAPHY;  Surgeon: Swaziland, Peter M, MD;  Location: [facility];  Service: Cardiovascular;  Laterality: N/A;   TONSILLECTOMY     WISDOM TOOTH EXTRACTION      Family History  Problem Relation Age of Onset   Osteoporosis Mother    Depression Mother    Obesity Mother    Hearing loss Father    Obesity Father    Healthy Sister    CVA Maternal Grandmother    Heart disease Maternal Grandmother    Arthritis Paternal Grandmother    Hearing loss Paternal Grandmother    Dementia Paternal Grandmother    Dementia Paternal Grandfather    Eczema Daughter     Allergies  Allergen Reactions   Gabapentin Other (See Comments)    Pt can not tolerated large dose give her mood swings   Prednisone Other (See Comments)    Severe mood swings   Meloxicam Rash   Penicillins Rash    Current Medications:   Current Outpatient Medications:    amphetamine-dextroamphetamine (ADDERALL XR) 10 MG 24 hr capsule, Take 1 capsule (10  mg total) by mouth 2 (two) times daily with breakfast and lunch., Disp: 60 capsule, Rfl: 0   ARIPiprazole (ABILIFY) 5 MG tablet, Take 1.5 tablets (7.5 mg total) by mouth daily. (Patient taking differently: Take 2.5 mg by mouth daily.), Disp: 45 tablet, Rfl: 1   aspirin EC 81 MG EC tablet, Take 1 tablet (81 mg total) by mouth daily. Swallow whole., Disp: 30 tablet, Rfl: 11   buPROPion (WELLBUTRIN XL) 300 MG 24 hr tablet, Take 1 tablet (300 mg total) by mouth daily., Disp: 90 tablet, Rfl: 3   Cholecalciferol (VITAMIN D3) 10 MCG (400 UNIT) CAPS, Take by mouth., Disp: , Rfl:    Cyanocobalamin (VITAMIN B-12 PO), Take by mouth., Disp: , Rfl:    diclofenac (VOLTAREN) 75 MG EC tablet, Take 1 tablet by mouth twice daily, Disp: 60 tablet, Rfl: 0   esomeprazole (NEXIUM) 20 MG capsule, Take 20 mg by mouth daily at 12 noon., Disp: , Rfl:    gabapentin (NEURONTIN) 100 MG capsule, TAKE 1 CAPSULE BY MOUTH THREE TIMES DAILY, Disp: 90 capsule, Rfl: 1   ketoconazole (NIZORAL) 2 % cream, APPLY TO AFFECTED AREA(S) ONCE TO TWICE DAILY, Disp: 30 g, Rfl: 0   lisinopril (ZESTRIL) 2.5 MG tablet, Take 1 tablet by mouth once daily, Disp: 90 tablet, Rfl: 3   metoprolol succinate (TOPROL-XL) 25 MG 24 hr tablet, Take 1 tablet by mouth once daily, Disp: 90 tablet, Rfl: 3   Multiple Vitamin (MULTIVITAMIN) tablet, Take 1 tablet by mouth daily., Disp: , Rfl:    nitroGLYCERIN (NITROSTAT) 0.4 MG SL tablet, Place 1 tablet (0.4 mg total) under the tongue every 5 (five) minutes x 3 doses as needed for chest pain. (Patient taking differently: Place 0.4 mg under the tongue every 5 (five) minutes x 3 doses as needed for chest pain. On hand), Disp: 25 tablet, Rfl: 12   Omega-3 Fatty Acids (FISH OIL PO), Take by mouth., Disp: , Rfl:    rosuvastatin (CRESTOR) 40 MG tablet, Take 1 tablet by mouth once daily, Disp: 90 tablet, Rfl: 3   Semaglutide,0.25 or 0.5MG /DOS, (OZEMPIC, 0.25 OR 0.5 MG/DOSE,) 2 MG/3ML SOPN, Inject into the skin., Disp: , Rfl:     triamcinolone ointment (KENALOG) 0.5 %, Apply to affected area 1-2 times daily, Disp: 15 g, Rfl: 0   venlafaxine XR (EFFEXOR-XR) 150 MG 24 hr capsule, Take 2 capsules (300 mg total) by mouth daily with breakfast., Disp: 180 capsule, Rfl: 3   metFORMIN (GLUCOPHAGE-XR) 500 MG  24 hr tablet, TAKE 2 TABLETS BY MOUTH ONCE DAILY WITH BREAKFAST (Patient not taking: Reported on 02/18/2023), Disp: 120 tablet, Rfl: 0   Review of Systems:   Review of Systems  Musculoskeletal:  Positive for back pain (lower back pain).  Skin:        (+) burning      Vitals:   Vitals:   04/10/23 0942  BP: 100/66  Pulse: 68  Temp: 97.7 F (36.5 C)  TempSrc: Temporal  SpO2: 97%  Weight: 227 lb (103 kg)  Height: 5\' 7"  (1.702 m)     Body mass index is 35.55 kg/m.  Physical Exam:   Physical Exam Vitals and nursing note reviewed.  Constitutional:      General: She is not in acute distress.    Appearance: She is well-developed. She is not ill-appearing or toxic-appearing.  Cardiovascular:     Rate and Rhythm: Normal rate and regular rhythm.     Pulses: Normal pulses.     Heart sounds: Normal heart sounds, S1 normal and S2 normal.  Pulmonary:     Effort: Pulmonary effort is normal.     Breath sounds: Normal breath sounds.  Musculoskeletal:     Comments: No significant ttp to right lateral lower rib cage  Skin:    General: Skin is warm and dry.     Comments: Approximately 3 mm area of scabbed tissue to low back with surrounding erythema  Neurological:     Mental Status: She is alert.     GCS: GCS eye subscore is 4. GCS verbal subscore is 5. GCS motor subscore is 6.  Psychiatric:        Speech: Speech normal.        Behavior: Behavior normal. Behavior is cooperative.    Gently removed scabbed tissue after prepping area with rubbing alcohol and using sterile forceps. No obvious evidence of remaining tick in skin.  Topical bacitracin applied to area and sterile bandage also applied.  Assessment  and Plan:   Burning sensation of skin Unclear etiology Update blood work, also add Burgdorfi testing If symptoms persist, will likely adjust gabapentin regimen Follow-up based on results and clinical sx  Right flank pain Unclear etiology Will add xray to evaluate area Continue diclofenac If xray normal and symptom(s) persist, will refer to sports medicine  Encounter for screening for other viral diseases Update today  Prediabetes Update A1c Restart metformin 500 mg XR, 2 tablets daily if labs elevated Follow-up with prescriber of Ozempic for further refills of that medication  Tick bite of lower back, initial encounter No obvious red flags Due to systemic symptom(s) she has today with burning of skin, will add Burdorfi testing Follow-up if any new/worsening sx  I,Rachel Rivera,acting as a scribe for [employer], PA.,have documented all relevant documentation on the behalf of Jarold Motto, PA,as directed by  Jarold Motto, PA while in the presence of Jarold Motto, [place].  I, Jarold Motto, [place], have reviewed all documentation for this visit. The documentation on 04/10/23 for the exam, diagnosis, procedures, and orders are all accurate and complete.  I spent a total of 42 minutes on this visit, today 04/10/23, which included  ordering tests, discussing plan of care with patient and using shared-decision making on next steps, refilling medications, and documenting the findings in the note.   Jarold Motto, PA-C

## 2023-04-11 ENCOUNTER — Other Ambulatory Visit: Payer: Medicare Other

## 2023-04-11 ENCOUNTER — Encounter: Payer: Self-pay | Admitting: Physician Assistant

## 2023-04-11 DIAGNOSIS — R82998 Other abnormal findings in urine: Secondary | ICD-10-CM

## 2023-04-12 LAB — B. BURGDORFI ANTIBODIES BY WB
B burgdorferi IgG Abs (IB): NEGATIVE
B burgdorferi IgM Abs (IB): NEGATIVE
Lyme Disease 18 kD IgG: NONREACTIVE
Lyme Disease 23 kD IgG: NONREACTIVE
Lyme Disease 23 kD IgM: NONREACTIVE
Lyme Disease 28 kD IgG: NONREACTIVE
Lyme Disease 30 kD IgG: NONREACTIVE
Lyme Disease 39 kD IgG: NONREACTIVE
Lyme Disease 39 kD IgM: NONREACTIVE
Lyme Disease 41 kD IgG: NONREACTIVE
Lyme Disease 41 kD IgM: NONREACTIVE
Lyme Disease 45 kD IgG: NONREACTIVE
Lyme Disease 58 kD IgG: REACTIVE — AB
Lyme Disease 66 kD IgG: REACTIVE — AB
Lyme Disease 93 kD IgG: NONREACTIVE

## 2023-04-12 LAB — HEPATITIS C ANTIBODY: Hepatitis C Ab: NONREACTIVE

## 2023-04-13 LAB — URINE CULTURE
MICRO NUMBER:: 15001293
SPECIMEN QUALITY:: ADEQUATE

## 2023-04-14 ENCOUNTER — Encounter: Payer: Self-pay | Admitting: Physician Assistant

## 2023-04-18 IMAGING — DX DG CHEST 2V
2 series · 2 of 2 positions shown · non-contrast
Comparison: Chest two views 11/01/2020

CLINICAL DATA: Epigastric pain radiating into the neck, left jaw,
and right arm.

EXAM:
CHEST - 2 VIEW

[chest pa]
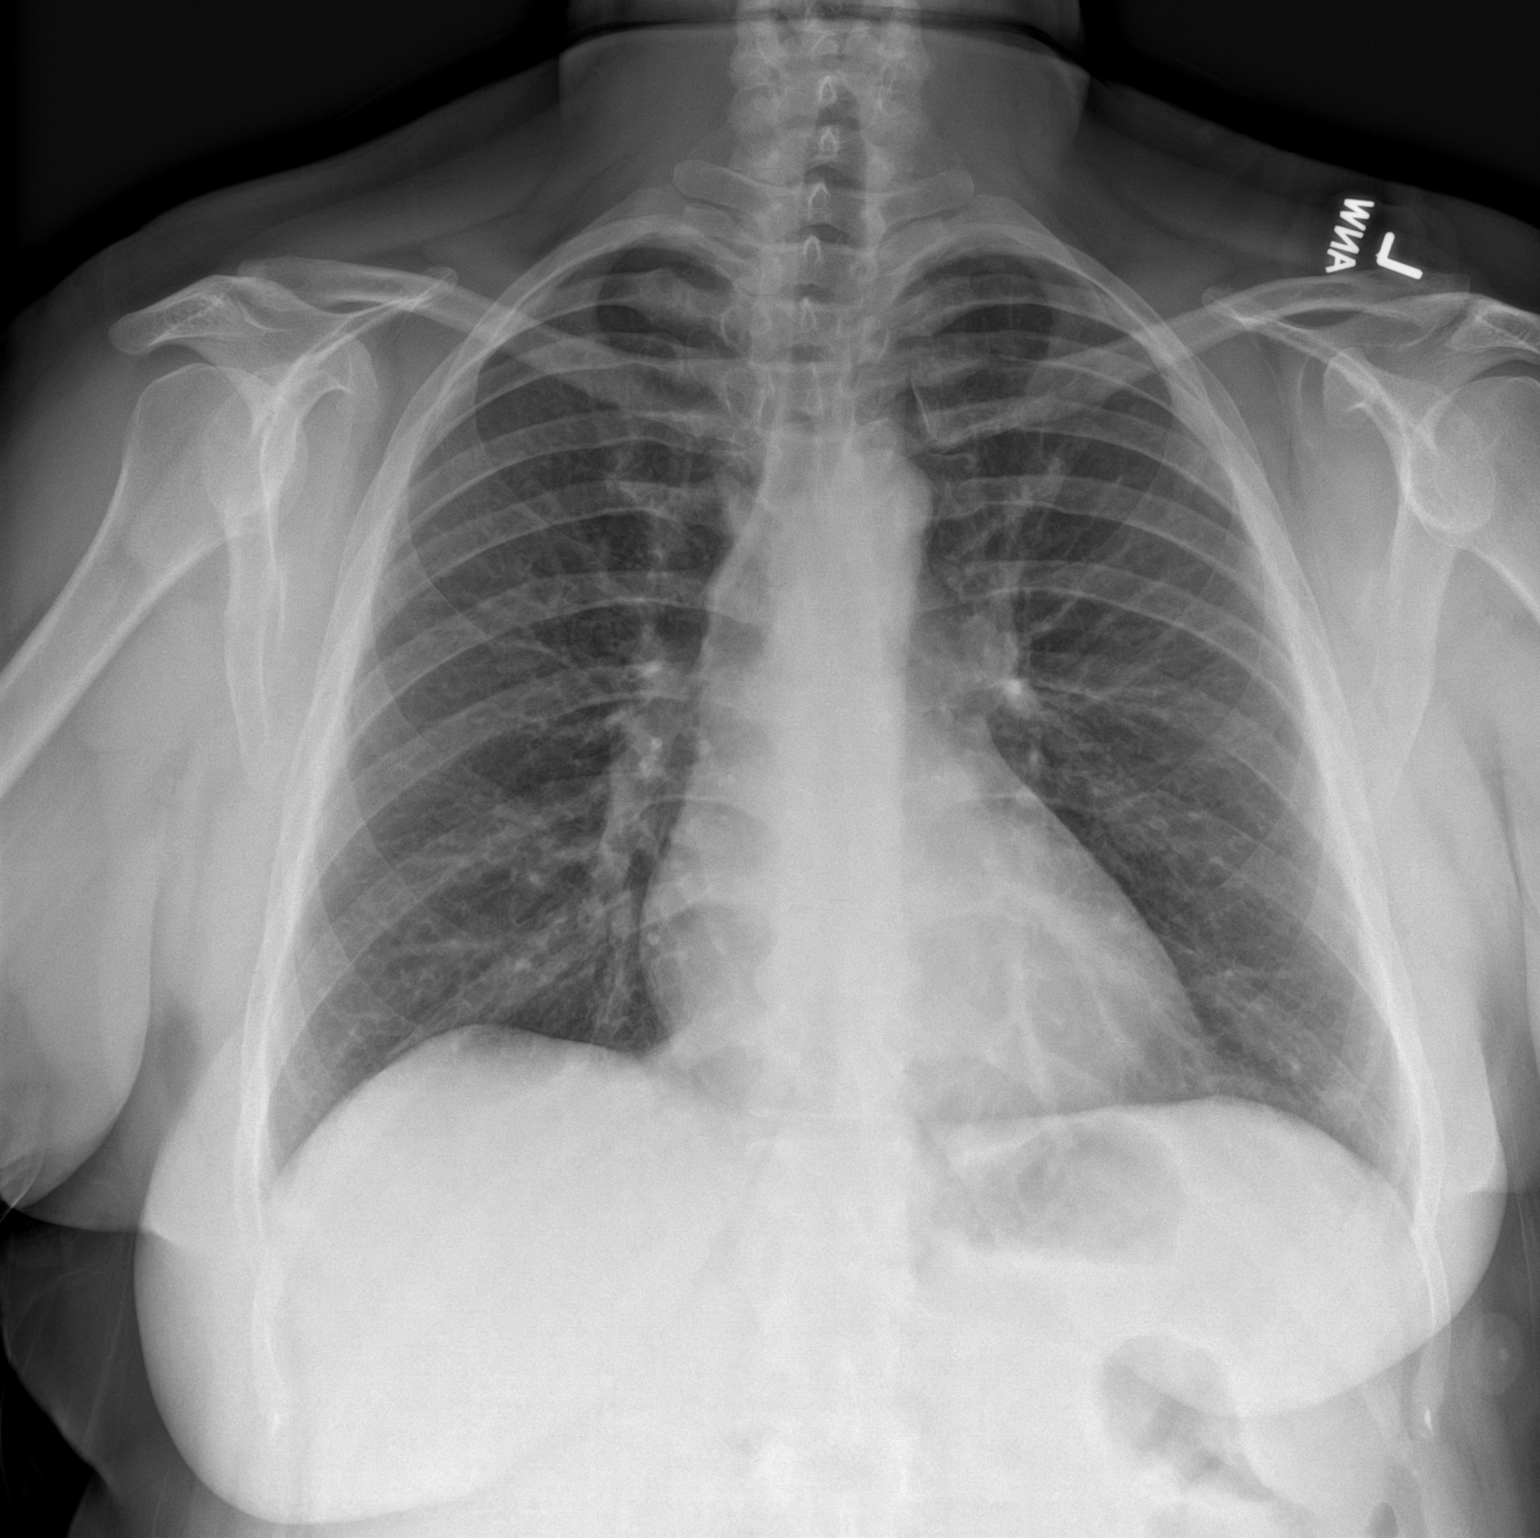

[chest lat]
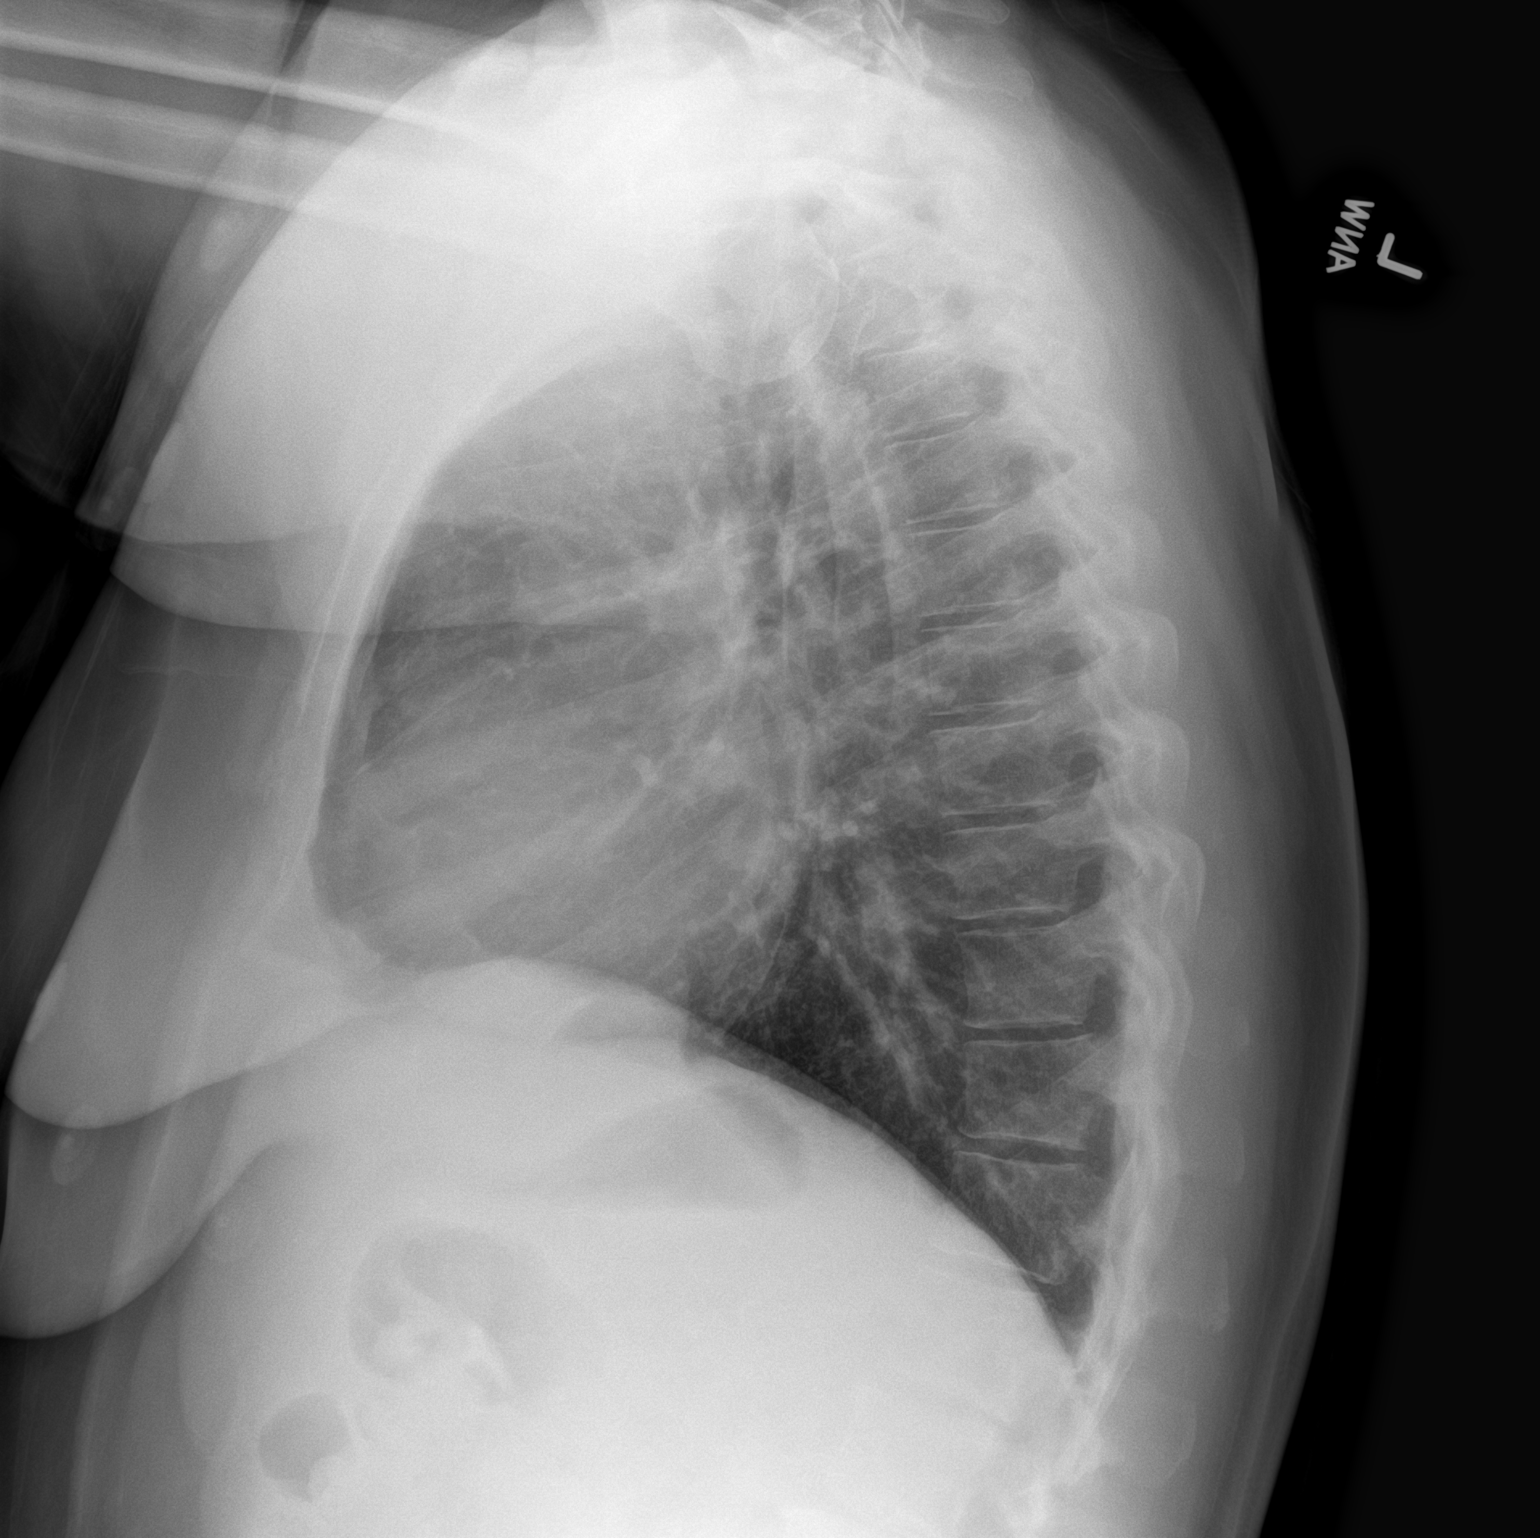

[2 of 2 positions shown; findings below may reference images not displayed]

FINDINGS: The heart size and mediastinal contours are within normal limits.
Both lungs are clear. No pleural effusion or pneumothorax. The
visualized skeletal structures are unremarkable.
IMPRESSION: No active cardiopulmonary disease.

## 2023-04-21 ENCOUNTER — Ambulatory Visit (INDEPENDENT_AMBULATORY_CARE_PROVIDER_SITE_OTHER): Payer: Medicare Other | Admitting: Physician Assistant

## 2023-04-21 ENCOUNTER — Encounter: Payer: Self-pay | Admitting: Physician Assistant

## 2023-04-21 VITALS — BP 100/70 | HR 65 | Temp 97.5°F | Ht 67.0 in | Wt 228.0 lb

## 2023-04-21 DIAGNOSIS — R82998 Other abnormal findings in urine: Secondary | ICD-10-CM | POA: Diagnosis not present

## 2023-04-21 LAB — CBC WITH DIFFERENTIAL/PLATELET
Basophils Absolute: 0.1 10*3/uL (ref 0.0–0.1)
Basophils Relative: 1.1 % (ref 0.0–3.0)
Eosinophils Absolute: 0.3 10*3/uL (ref 0.0–0.7)
Eosinophils Relative: 3.4 % (ref 0.0–5.0)
HCT: 35.9 % — ABNORMAL LOW (ref 36.0–46.0)
Hemoglobin: 11.8 g/dL — ABNORMAL LOW (ref 12.0–15.0)
Lymphocytes Relative: 28.4 % (ref 12.0–46.0)
Lymphs Abs: 2.5 10*3/uL (ref 0.7–4.0)
MCHC: 32.7 g/dL (ref 30.0–36.0)
MCV: 88.3 fl (ref 78.0–100.0)
Monocytes Absolute: 0.5 10*3/uL (ref 0.1–1.0)
Monocytes Relative: 6 % (ref 3.0–12.0)
Neutro Abs: 5.3 10*3/uL (ref 1.4–7.7)
Neutrophils Relative %: 61.1 % (ref 43.0–77.0)
Platelets: 562 10*3/uL — ABNORMAL HIGH (ref 150.0–400.0)
RBC: 4.07 Mil/uL (ref 3.87–5.11)
RDW: 14.9 % (ref 11.5–15.5)
WBC: 8.7 10*3/uL (ref 4.0–10.5)

## 2023-04-21 LAB — COMPREHENSIVE METABOLIC PANEL
ALT: 26 U/L (ref 0–35)
AST: 20 U/L (ref 0–37)
Albumin: 3.5 g/dL (ref 3.5–5.2)
Alkaline Phosphatase: 73 U/L (ref 39–117)
BUN: 9 mg/dL (ref 6–23)
CO2: 31 mEq/L (ref 19–32)
Calcium: 8.9 mg/dL (ref 8.4–10.5)
Chloride: 102 mEq/L (ref 96–112)
Creatinine, Ser: 1.05 mg/dL (ref 0.40–1.20)
GFR: 63.27 mL/min (ref >60.00)
Glucose, Bld: 77 mg/dL (ref 70–99)
Potassium: 4.2 mEq/L (ref 3.5–5.1)
Sodium: 138 mEq/L (ref 135–145)
Total Bilirubin: 0.2 mg/dL (ref 0.2–1.2)
Total Protein: 6.6 g/dL (ref 6.0–8.3)

## 2023-04-21 LAB — URINALYSIS, ROUTINE W REFLEX MICROSCOPIC
Bilirubin Urine: NEGATIVE
Hgb urine dipstick: NEGATIVE
Ketones, ur: NEGATIVE
Nitrite: NEGATIVE
Specific Gravity, Urine: <=1.005 — AB (ref 1.000–1.030)
Total Protein, Urine: NEGATIVE
Urine Glucose: NEGATIVE
Urobilinogen, UA: 0.2 (ref 0.0–1.0)
pH: 6.5 (ref 5.0–8.0)

## 2023-04-21 NOTE — Progress Notes (Signed)
Crystal Bean is a 47 y.o. female here for a follow up of a pre-existing problem.  History of Present Illness:   Chief Complaint  Patient presents with   Follow-up    Pt here for f/u on UTI and labs    HPI   UTI: She was last seen on 5/23 for skin burning and pain, diaphoresis, and chills.  Urine sample was positive for urinary tract infection -- we started her on keflex for this  Her symptoms have resolved since her last visit. She was prescribed antibiotics, which she tolerated well.  Has been drinking plenty water.  Denies any vaginal discharge, nausea and vomiting, further episodes of skin burning, chills, sweating  Past Medical History:  Diagnosis Date   ADHD (attention deficit hyperactivity disorder)    Anxiety    B12 deficiency    Back pain    Bipolar 1 disorder (HCC)    Bulging lumbar disc    C. difficile diarrhea 04/24/2016   treated with metronidazole   Constipation    Depression    GERD (gastroesophageal reflux disease)    History of chicken pox    History of shingles 2013   Hypertension    IBS (irritable bowel syndrome)    Joint pain    Major depressive disorder    Migraines    Obesity    Obsessive-compulsive disorder    Osteoarthritis    Prediabetes    PTSD (post-traumatic stress disorder)    Sleep apnea    SOB (shortness of breath)      Social History   Tobacco Use   Smoking status: Former    Packs/day: 1.00    Years: 20.00    Additional pack years: 0.00    Total pack years: 20.00    Types: Cigarettes    Quit date: 06/19/2011    Years since quitting: 11.8   Smokeless tobacco: Never  Vaping Use   Vaping Use: Never used  Substance Use Topics   Alcohol use: Not Currently   Drug use: No    Past Surgical History:  Procedure Laterality Date   BLADDER SURGERY  1981   Urethera stretched   caps on teeth  1980   Caps on all Teeth   CESAREAN SECTION  2003   CORONARY STENT INTERVENTION N/A 12/28/2021   Procedure: CORONARY STENT  INTERVENTION;  Surgeon: Swaziland, Peter M, MD;  Location: [facility];  Service: Cardiovascular;  Laterality: N/A;   LEFT HEART CATH AND CORONARY ANGIOGRAPHY N/A 12/28/2021   Procedure: LEFT HEART CATH AND CORONARY ANGIOGRAPHY;  Surgeon: Swaziland, Peter M, MD;  Location: [facility];  Service: Cardiovascular;  Laterality: N/A;   TONSILLECTOMY     WISDOM TOOTH EXTRACTION      Family History  Problem Relation Age of Onset   Osteoporosis Mother    Depression Mother    Obesity Mother    Hearing loss Father    Obesity Father    Healthy Sister    CVA Maternal Grandmother    Heart disease Maternal Grandmother    Arthritis Paternal Grandmother    Hearing loss Paternal Grandmother    Dementia Paternal Grandmother    Dementia Paternal Grandfather    Eczema Daughter     Allergies  Allergen Reactions   Gabapentin Other (See Comments)    Pt can not tolerated large dose give her mood swings   Prednisone Other (See Comments)    Severe mood swings   Meloxicam Rash   Penicillins Rash  Current Medications:   Current Outpatient Medications:    amphetamine-dextroamphetamine (ADDERALL XR) 10 MG 24 hr capsule, Take 1 capsule (10 mg total) by mouth 2 (two) times daily with breakfast and lunch., Disp: 60 capsule, Rfl: 0   ARIPiprazole (ABILIFY) 5 MG tablet, Take 1.5 tablets (7.5 mg total) by mouth daily. (Patient taking differently: Take 2.5 mg by mouth daily.), Disp: 45 tablet, Rfl: 1   aspirin EC 81 MG EC tablet, Take 1 tablet (81 mg total) by mouth daily. Swallow whole., Disp: 30 tablet, Rfl: 11   buPROPion (WELLBUTRIN XL) 300 MG 24 hr tablet, Take 1 tablet (300 mg total) by mouth daily., Disp: 90 tablet, Rfl: 3   Cholecalciferol (VITAMIN D3) 10 MCG (400 UNIT) CAPS, Take by mouth., Disp: , Rfl:    Cyanocobalamin (VITAMIN B-12 PO), Take by mouth., Disp: , Rfl:    diclofenac (VOLTAREN) 75 MG EC tablet, Take 1 tablet by mouth twice daily, Disp: 60 tablet, Rfl: 0   esomeprazole (NEXIUM)  20 MG capsule, Take 20 mg by mouth daily at 12 noon., Disp: , Rfl:    gabapentin (NEURONTIN) 100 MG capsule, TAKE 1 CAPSULE BY MOUTH THREE TIMES DAILY, Disp: 90 capsule, Rfl: 1   ketoconazole (NIZORAL) 2 % cream, APPLY TO AFFECTED AREA(S) ONCE TO TWICE DAILY, Disp: 30 g, Rfl: 0   lisinopril (ZESTRIL) 2.5 MG tablet, Take 1 tablet by mouth once daily, Disp: 90 tablet, Rfl: 3   metoprolol succinate (TOPROL-XL) 25 MG 24 hr tablet, Take 1 tablet by mouth once daily, Disp: 90 tablet, Rfl: 3   Multiple Vitamin (MULTIVITAMIN) tablet, Take 1 tablet by mouth daily., Disp: , Rfl:    nitroGLYCERIN (NITROSTAT) 0.4 MG SL tablet, Place 1 tablet (0.4 mg total) under the tongue every 5 (five) minutes x 3 doses as needed for chest pain. (Patient taking differently: Place 0.4 mg under the tongue every 5 (five) minutes x 3 doses as needed for chest pain. On hand), Disp: 25 tablet, Rfl: 12   Omega-3 Fatty Acids (FISH OIL PO), Take by mouth., Disp: , Rfl:    rosuvastatin (CRESTOR) 40 MG tablet, Take 1 tablet by mouth once daily, Disp: 90 tablet, Rfl: 3   Semaglutide,0.25 or 0.5MG /DOS, (OZEMPIC, 0.25 OR 0.5 MG/DOSE,) 2 MG/3ML SOPN, Inject into the skin., Disp: , Rfl:    triamcinolone ointment (KENALOG) 0.5 %, Apply to affected area 1-2 times daily, Disp: 15 g, Rfl: 0   venlafaxine XR (EFFEXOR-XR) 150 MG 24 hr capsule, Take 2 capsules (300 mg total) by mouth daily with breakfast., Disp: 180 capsule, Rfl: 3   metFORMIN (GLUCOPHAGE-XR) 500 MG 24 hr tablet, TAKE 2 TABLETS BY MOUTH ONCE DAILY WITH BREAKFAST (Patient not taking: Reported on 02/18/2023), Disp: 120 tablet, Rfl: 0   Review of Systems:   ROS Negative unless otherwise specified per HPI.  Vitals:   Vitals:   04/21/23 1048  BP: 100/70  Pulse: 65  Temp: (!) 97.5 F (36.4 C)  TempSrc: Temporal  SpO2: 97%  Weight: 228 lb (103.4 kg)  Height: 5\' 7"  (1.702 m)     Body mass index is 35.71 kg/m.  Physical Exam:   Physical Exam Constitutional:       Appearance: Normal appearance. She is well-developed.  HENT:     Head: Normocephalic and atraumatic.  Eyes:     General: Lids are normal.     Extraocular Movements: Extraocular movements intact.     Conjunctiva/sclera: Conjunctivae normal.  Pulmonary:     Effort: Pulmonary effort  is normal.  Musculoskeletal:        General: Normal range of motion.     Cervical back: Normal range of motion and neck supple.  Skin:    General: Skin is warm and dry.  Neurological:     Mental Status: She is alert and oriented to person, place, and time.  Psychiatric:        Attention and Perception: Attention and perception normal.        Mood and Affect: Mood normal.        Behavior: Behavior normal.        Thought Content: Thought content normal.        Judgment: Judgment normal.     Assessment and Plan:   Leukocytes in urine Symptoms resolved Will update blood work and UA to ensure resolution of abnormalities Further work-up / follow-up based on results and symptom(s)   I,Rachel Rivera,acting as a Neurosurgeon for [employer], PA.,have documented all relevant documentation on the behalf of Jarold Motto, PA,as directed by  Jarold Motto, PA while in the presence of Jarold Motto, [place].  I, Jarold Motto, [place], have reviewed all documentation for this visit. The documentation on 04/21/23 for the exam, diagnosis, procedures, and orders are all accurate and complete.   Jarold Motto, PA-C

## 2023-04-21 NOTE — Patient Instructions (Signed)
It was great to see you!  We will update all of your recent abnormal blood work today and urine studies today  Take care,  Jarold Motto PA-C

## 2023-04-22 ENCOUNTER — Other Ambulatory Visit: Payer: Self-pay | Admitting: Physician Assistant

## 2023-04-22 DIAGNOSIS — D75839 Thrombocytosis, unspecified: Secondary | ICD-10-CM

## 2023-04-23 ENCOUNTER — Other Ambulatory Visit: Payer: Self-pay | Admitting: *Deleted

## 2023-04-23 ENCOUNTER — Other Ambulatory Visit: Payer: Self-pay | Admitting: Physician Assistant

## 2023-04-23 DIAGNOSIS — N390 Urinary tract infection, site not specified: Secondary | ICD-10-CM

## 2023-04-23 LAB — URINE CULTURE
MICRO NUMBER:: 15033006
SPECIMEN QUALITY:: ADEQUATE

## 2023-04-23 MED ORDER — CIPROFLOXACIN HCL 250 MG PO TABS: 250.0000 mg | ORAL_TABLET | Freq: Two times a day (BID) | ORAL | 0 refills | Status: AC

## 2023-04-26 ENCOUNTER — Other Ambulatory Visit: Payer: Self-pay | Admitting: Physician Assistant

## 2023-05-09 ENCOUNTER — Other Ambulatory Visit: Payer: Self-pay | Admitting: Physician Assistant

## 2023-05-09 ENCOUNTER — Telehealth: Payer: Self-pay | Admitting: Physician Assistant

## 2023-05-09 MED ORDER — AMPHETAMINE-DEXTROAMPHET ER 10 MG PO CP24: 10.0000 mg | ORAL_CAPSULE | Freq: Two times a day (BID) | ORAL | 0 refills | Status: DC

## 2023-05-09 NOTE — Telephone Encounter (Signed)
Pt LVM @ 12:26p requesting refill of Adderall to  [facility] [address] [place], [place] - [address] AT [facility] [address] [place] [address] Phone: [phone]  Fax: [phone]    Next appt 7/24

## 2023-05-09 NOTE — Telephone Encounter (Signed)
sent 

## 2023-05-16 ENCOUNTER — Other Ambulatory Visit (INDEPENDENT_AMBULATORY_CARE_PROVIDER_SITE_OTHER): Payer: Medicare Other

## 2023-05-16 DIAGNOSIS — D75839 Thrombocytosis, unspecified: Secondary | ICD-10-CM

## 2023-05-16 DIAGNOSIS — N390 Urinary tract infection, site not specified: Secondary | ICD-10-CM | POA: Diagnosis not present

## 2023-05-16 LAB — URINALYSIS, ROUTINE W REFLEX MICROSCOPIC
Bilirubin Urine: NEGATIVE
Hgb urine dipstick: NEGATIVE
Ketones, ur: NEGATIVE
Leukocytes,Ua: NEGATIVE
Nitrite: NEGATIVE
RBC / HPF: NONE SEEN (ref 0–?)
Specific Gravity, Urine: <=1.005 — AB (ref 1.000–1.030)
Total Protein, Urine: NEGATIVE
Urine Glucose: NEGATIVE
Urobilinogen, UA: 0.2 (ref 0.0–1.0)
WBC, UA: NONE SEEN (ref 0–?)
pH: 6 (ref 5.0–8.0)

## 2023-05-16 LAB — CBC WITH DIFFERENTIAL/PLATELET
Basophils Absolute: 0.1 10*3/uL (ref 0.0–0.1)
Basophils Relative: 0.9 % (ref 0.0–3.0)
Eosinophils Absolute: 0.2 10*3/uL (ref 0.0–0.7)
Eosinophils Relative: 1.8 % (ref 0.0–5.0)
HCT: 37 % (ref 36.0–46.0)
Hemoglobin: 11.9 g/dL — ABNORMAL LOW (ref 12.0–15.0)
Lymphocytes Relative: 24 % (ref 12.0–46.0)
Lymphs Abs: 2.7 10*3/uL (ref 0.7–4.0)
MCHC: 32.1 g/dL (ref 30.0–36.0)
MCV: 89.4 fl (ref 78.0–100.0)
Monocytes Absolute: 0.8 10*3/uL (ref 0.1–1.0)
Monocytes Relative: 6.8 % (ref 3.0–12.0)
Neutro Abs: 7.5 10*3/uL (ref 1.4–7.7)
Neutrophils Relative %: 66.5 % (ref 43.0–77.0)
Platelets: 371 10*3/uL (ref 150.0–400.0)
RBC: 4.14 Mil/uL (ref 3.87–5.11)
RDW: 15.4 % (ref 11.5–15.5)
WBC: 11.2 10*3/uL — ABNORMAL HIGH (ref 4.0–10.5)

## 2023-05-17 LAB — URINE CULTURE
MICRO NUMBER:: 15140751
SPECIMEN QUALITY:: ADEQUATE

## 2023-05-27 ENCOUNTER — Other Ambulatory Visit: Payer: Self-pay | Admitting: Physician Assistant
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
## 2023-06-11 ENCOUNTER — Ambulatory Visit (INDEPENDENT_AMBULATORY_CARE_PROVIDER_SITE_OTHER): Payer: Medicare Other | Admitting: Physician Assistant

## 2023-06-11 ENCOUNTER — Encounter: Payer: Self-pay | Admitting: Physician Assistant

## 2023-06-11 VITALS — BP 110/70 | HR 71 | Temp 97.8°F | Ht 67.0 in | Wt 230.4 lb

## 2023-06-11 DIAGNOSIS — F319 Bipolar disorder, unspecified: Secondary | ICD-10-CM

## 2023-06-11 DIAGNOSIS — F902 Attention-deficit hyperactivity disorder, combined type: Secondary | ICD-10-CM

## 2023-06-11 DIAGNOSIS — R7303 Prediabetes: Secondary | ICD-10-CM | POA: Diagnosis not present

## 2023-06-11 DIAGNOSIS — E782 Mixed hyperlipidemia: Secondary | ICD-10-CM | POA: Diagnosis not present

## 2023-06-11 DIAGNOSIS — E669 Obesity, unspecified: Secondary | ICD-10-CM

## 2023-06-11 DIAGNOSIS — Z0000 Encounter for general adult medical examination without abnormal findings: Secondary | ICD-10-CM

## 2023-06-11 DIAGNOSIS — F411 Generalized anxiety disorder: Secondary | ICD-10-CM

## 2023-06-11 DIAGNOSIS — Z1211 Encounter for screening for malignant neoplasm of colon: Secondary | ICD-10-CM

## 2023-06-11 DIAGNOSIS — F4310 Post-traumatic stress disorder, unspecified: Secondary | ICD-10-CM | POA: Diagnosis not present

## 2023-06-11 DIAGNOSIS — Z6836 Body mass index (BMI) 36.0-36.9, adult: Secondary | ICD-10-CM

## 2023-06-11 LAB — LIPID PANEL
Cholesterol: 110 mg/dL (ref 0–200)
HDL: 41.6 mg/dL (ref >39.00)
LDL Cholesterol: 30 mg/dL (ref 0–99)
NonHDL: 68.87
Total CHOL/HDL Ratio: 3
Triglycerides: 196 mg/dL — ABNORMAL HIGH (ref 0.0–149.0)
VLDL: 39.2 mg/dL (ref 0.0–40.0)

## 2023-06-11 MED ORDER — AMPHETAMINE-DEXTROAMPHET ER 10 MG PO CP24: 10.0000 mg | ORAL_CAPSULE | Freq: Two times a day (BID) | ORAL | 0 refills | Status: DC

## 2023-06-11 MED ORDER — METFORMIN HCL ER 500 MG PO TB24: ORAL_TABLET | ORAL | 1 refills | Status: DC

## 2023-06-11 MED ORDER — ARIPIPRAZOLE 5 MG PO TABS: 5.0000 mg | ORAL_TABLET | Freq: Every day | ORAL | 0 refills | Status: DC

## 2023-06-11 NOTE — Progress Notes (Signed)
[facility] Med Check  Patient ID: Crystal Bean,  MRN: [id number]  PCP: Jarold Motto, PA  Date of Evaluation: 06/11/2023 Time spent:20 minutes  Chief Complaint:  Chief Complaint   ADHD; Depression; Follow-up    HISTORY/CURRENT STATUS: HPI For routine med check.  She's been off Abilify for a few months. Wanted to take one less pill.  Her husband did not know that she had done that and recently he told her she was more talkative, loud, talking over people and felt like she was manic.  She has not had more energy.  Her sleep has been normal for her.  No risky behaviors.  No increased spending, no increased libido.  No hallucinations.  Patient is able to enjoy things.  Energy and motivation are good.  No extreme sadness, tearfulness, or feelings of hopelessness.  ADLs and personal hygiene are normal.    Appetite has not changed.  Weight is stable.  She does get anxious at times but it is not severe.  She has Xanax at home but hardly ever takes it.  She does not like the way it makes her feel so she just pushes through the anxiety.  It does not happen very often or last too long.  Denies suicidal or homicidal thoughts.  States that attention is good without easy distractibility.  Able to focus on things and finish tasks to completion.   Patient denies increased energy with decreased need for sleep, increased talkativeness, racing thoughts, impulsivity or risky behaviors, increased spending, increased libido, grandiosity, increased irritability or anger, paranoia, or hallucinations.  Denies dizziness, syncope, seizures, numbness, tingling, tremor, tics, unsteady gait, slurred speech, confusion. Denies muscle or joint pain, stiffness, or dystonia. Denies unexplained weight loss, frequent infections, or sores that heal slowly.  No polyphagia, polydipsia, or polyuria. Denies visual changes or paresthesias.   Individual Medical History/ Review of Systems: Changes? :No    Past medications  for mental health diagnoses include: Prozac, Celexa, Lexapro, Wellbutrin, Effexor XR, Pristiq, Abilify,  Adderall, Ritalin, Klonopin, Xanax, Gabapentin, Buspar  Allergies: Gabapentin, Prednisone, Meloxicam, and Penicillins  Current Medications:  Current Outpatient Medications:    [START ON 07/11/2023] amphetamine-dextroamphetamine (ADDERALL XR) 10 MG 24 hr capsule, Take 1 capsule (10 mg total) by mouth 2 (two) times daily with breakfast and lunch., Disp: 60 capsule, Rfl: 0   [START ON 08/10/2023] amphetamine-dextroamphetamine (ADDERALL XR) 10 MG 24 hr capsule, Take 1 capsule (10 mg total) by mouth 2 (two) times daily with breakfast and lunch., Disp: 60 capsule, Rfl: 0   aspirin EC 81 MG EC tablet, Take 1 tablet (81 mg total) by mouth daily. Swallow whole., Disp: 30 tablet, Rfl: 11   buPROPion (WELLBUTRIN XL) 300 MG 24 hr tablet, Take 1 tablet (300 mg total) by mouth daily., Disp: 90 tablet, Rfl: 3   Cholecalciferol (VITAMIN D3) 10 MCG (400 UNIT) CAPS, Take by mouth., Disp: , Rfl:    diclofenac (VOLTAREN) 75 MG EC tablet, Take 1 tablet by mouth twice daily, Disp: 60 tablet, Rfl: 0   esomeprazole (NEXIUM) 20 MG capsule, Take 20 mg by mouth daily at 12 noon., Disp: , Rfl:    gabapentin (NEURONTIN) 100 MG capsule, TAKE 1 CAPSULE BY MOUTH THREE TIMES DAILY, Disp: 90 capsule, Rfl: 1   ketoconazole (NIZORAL) 2 % cream, APPLY TO AFFECTED AREA(S) ONCE TO TWICE DAILY, Disp: 30 g, Rfl: 0   lisinopril (ZESTRIL) 2.5 MG tablet, Take 1 tablet by mouth once daily, Disp: 90 tablet, Rfl: 3   metoprolol succinate (  TOPROL-XL) 25 MG 24 hr tablet, Take 1 tablet by mouth once daily, Disp: 90 tablet, Rfl: 3   Multiple Vitamin (MULTIVITAMIN) tablet, Take 1 tablet by mouth daily., Disp: , Rfl:    Omega-3 Fatty Acids (FISH OIL PO), Take by mouth., Disp: , Rfl:    rosuvastatin (CRESTOR) 40 MG tablet, Take 1 tablet by mouth once daily, Disp: 90 tablet, Rfl: 3   triamcinolone ointment (KENALOG) 0.5 %, Apply to affected area  1-2 times daily, Disp: 15 g, Rfl: 0   venlafaxine XR (EFFEXOR-XR) 150 MG 24 hr capsule, Take 2 capsules (300 mg total) by mouth daily with breakfast., Disp: 180 capsule, Rfl: 3   amphetamine-dextroamphetamine (ADDERALL XR) 10 MG 24 hr capsule, Take 1 capsule (10 mg total) by mouth 2 (two) times daily with breakfast and lunch., Disp: 60 capsule, Rfl: 0   ARIPiprazole (ABILIFY) 5 MG tablet, Take 1 tablet (5 mg total) by mouth daily., Disp: 90 tablet, Rfl: 0   metFORMIN (GLUCOPHAGE-XR) 500 MG 24 hr tablet, TAKE 2 TABLETS BY MOUTH ONCE DAILY WITH BREAKFAST (Patient not taking: Reported on 02/18/2023), Disp: 120 tablet, Rfl: 0   nitroGLYCERIN (NITROSTAT) 0.4 MG SL tablet, Place 1 tablet (0.4 mg total) under the tongue every 5 (five) minutes x 3 doses as needed for chest pain. (Patient not taking: Reported on 06/11/2023), Disp: 25 tablet, Rfl: 12   Semaglutide,0.25 or 0.5MG /DOS, (OZEMPIC, 0.25 OR 0.5 MG/DOSE,) 2 MG/3ML SOPN, Inject into the skin. (Patient not taking: Reported on 06/11/2023), Disp: , Rfl:  Medication Side Effects: none  Family Medical/ Social History: Changes?   MENTAL HEALTH EXAM:  There were no vitals taken for this visit.There is no height or weight on file to calculate BMI.  General Appearance: Casual, Well Groomed and Obese  Eye Contact:  Good  Speech:  Clear and Coherent, Pressured, and Talkative  Volume:  Normal  Mood:  Euthymic  Affect:  Congruent  Thought Process:  Goal Directed and Descriptions of Associations: Circumstantial  Orientation:  Full (Time, Place, and Person)  Thought Content: Logical   Suicidal Thoughts:  No  Homicidal Thoughts:  No  Memory:  WNL  Judgement:  Good  Insight:  Good  Psychomotor Activity:  Normal  Concentration:  Concentration: Good and Attention Span: Good  Recall:  Good  Fund of Knowledge: Good  Language: Good  Assets:  Desire for Improvement Financial Resources/Insurance Housing Transportation  ADL's:  Intact  Cognition: WNL   Prognosis:  Good   DIAGNOSES:    ICD-10-CM   1. Bipolar I disorder (HCC)  F31.9     2. PTSD (post-traumatic stress disorder)  F43.10     3. Generalized anxiety disorder  F41.1     4. Attention deficit hyperactivity disorder (ADHD), combined type  F90.2       Receiving Psychotherapy: Yes  Alba Cory   RECOMMENDATIONS:  PDMP was reviewed.  Last Adderall filled 05/09/2023.  Gabapentin filled 05/09/2023. I provided 20 minutes of face to face time during this encounter, including time spent before and after the visit in records review, medical decision making, counseling pertinent to today's visit, and charting.   We discussed the Abilify.  I recommend restarting it.  She agrees.  We discussed the fact that any of the antipsychotics can increase cholesterol and glucose.  However we have to look at the benefit verses risk and we both agree that mentally she felt better when she was on the Abilify.  She is on a statin, and will restart  metformin most likely today since Ozempic has not been available.  She did not have any issues with glucose or lipids but had an MI last year and was put on meds, I think for prevention more than she needed them for treatment.  She understands the risks and would like to restart Abilify.  Her PCP follows her labs.  Continue Xanax 1 mg, 1/2-1 po bid prn. Use sparingly.  (She has an old bottle, not listed on the med list.) Continue Adderall XR 10 mg, 1 p.o. at breakfast and 1 p.o. at lunch. Restart Abilify 5 mg, 1 p.o. every morning. Continue Wellbutrin XL 300 mg, 1 p.o. daily. Continue gabapentin 100 mg, 1-3 daily as directed. Continue Effexor XR 150 mg, 2 p.o. every morning. Continue therapy. Return in 6 weeks.  Melony Overly, PA-C

## 2023-06-11 NOTE — Progress Notes (Signed)
Subjective:    Crystal Bean is a 47 y.o. female and is here for follow-up of chronic medical issues.  HPI  Health Maintenance Due  Topic Date Due   DTaP/Tdap/Td (2 - Td or Tdap) 02/13/2023    Acute Concerns: None.    Chronic Issues: Prediabetes: Has gone 9 weeks without Ozempic however, she notes that while taking Ozempic she experienced stomach pain and difficulty relieving herself.  Would like to discuss resuming Metformin.   Bipolar 1: Continues to see Lowell Guitar and is overall managing well Saw her provider today and did decide to resume 5 mg Abilify   Hypertension: Saw Joylene Grapes, NP 1/2 following  Managed with 25 mg Toprol, 2.5 mg Lisinopril, and 40 mg Crestor  Health Maintenance: Immunizations -- overdue for Tetanus, Diphtheria, and Pertussis (Tdap) -- encouraged her to get this done  Colonoscopy -- Due Mammogram -- Last done 09/11/2022. Results were normal.  PAP -- Last done 10/18/2019. Results were normal.  Bone Density -- N/A Diet -- Healthy overall Exercise -- When possible  Sleep habits -- Good sleep quality.  Mood -- Stable.  UTD with dentist? - No, has dentures UTD with eye doctor? - not since last year  Weight history: Wt Readings from Last 10 Encounters:  06/11/23 230 lb 6.1 oz (104.5 kg)  04/21/23 228 lb (103.4 kg)  04/10/23 227 lb (103 kg)  02/18/23 230 lb 12.8 oz (104.7 kg)  12/11/22 234 lb (106.1 kg)  12/11/22 234 lb (106.1 kg)  11/19/22 229 lb 9.6 oz (104.1 kg)  09/11/22 230 lb (104.3 kg)  07/09/22 222 lb 4 oz (100.8 kg)  07/08/22 223 lb (101.2 kg)   Body mass index is 36.08 kg/m. Patient's last menstrual period was 05/28/2023 (approximate).  Alcohol use:  reports that she does not currently use alcohol.  Tobacco use:  Tobacco Use: Medium Risk (06/11/2023)   Patient History    Smoking Tobacco Use: Former    Smokeless Tobacco Use: Never    Passive Exposure: Not on file   Eligible for lung cancer screening? no      06/11/2023    1:17 PM  Depression screen PHQ 2/9  Decreased Interest 1  Down, Depressed, Hopeless 1  PHQ - 2 Score 2  Altered sleeping 0  Tired, decreased energy 3  Change in appetite 2  Feeling bad or failure about yourself  2  Trouble concentrating 3  Moving slowly or fidgety/restless 2  Suicidal thoughts 0  PHQ-9 Score 14  Difficult doing work/chores Very difficult     Other providers/specialists: Patient Care Team: Jarold Motto, [place] as PCP - General (Physician Assistant) Lewayne Bunting, MD as PCP - Cardiology (Cardiology) Georgiann Cocker, MD as Consulting Physician (Psychiatry) Suanne Marker, MD as Consulting Physician (Neurology)    PMHx, SurgHx, SocialHx, Medications, and Allergies were reviewed in the Visit Navigator and updated as appropriate.   Past Medical History:  Diagnosis Date   ADHD (attention deficit hyperactivity disorder)    Anxiety    B12 deficiency    Back pain    Bipolar 1 disorder (HCC)    Bulging lumbar disc    C. difficile diarrhea 04/24/2016   treated with metronidazole   Constipation    Depression    GERD (gastroesophageal reflux disease)    History of chicken pox    History of shingles 2013   Hypertension    IBS (irritable bowel syndrome)    Joint pain    Major depressive  disorder    Migraines    Obesity    Obsessive-compulsive disorder    Osteoarthritis    Prediabetes    PTSD (post-traumatic stress disorder)    Sleep apnea    SOB (shortness of breath)      Past Surgical History:  Procedure Laterality Date   BLADDER SURGERY  1981   Urethera stretched   caps on teeth  1980   Caps on all Teeth   CESAREAN SECTION  2003   CORONARY STENT INTERVENTION N/A 12/28/2021   Procedure: CORONARY STENT INTERVENTION;  Surgeon: Swaziland, Peter M, MD;  Location: [facility] INVASIVE CV LAB;  Service: Cardiovascular;  Laterality: N/A;   LEFT HEART CATH AND CORONARY ANGIOGRAPHY N/A 12/28/2021   Procedure: LEFT HEART CATH AND CORONARY  ANGIOGRAPHY;  Surgeon: Swaziland, Peter M, MD;  Location: [facility] INVASIVE CV LAB;  Service: Cardiovascular;  Laterality: N/A;   TONSILLECTOMY     WISDOM TOOTH EXTRACTION       Family History  Problem Relation Age of Onset   Osteoporosis Mother    Depression Mother    Obesity Mother    Hearing loss Father    Obesity Father    Healthy Sister    CVA Maternal Grandmother    Heart disease Maternal Grandmother    Arthritis Paternal Grandmother    Hearing loss Paternal Grandmother    Dementia Paternal Grandmother    Dementia Paternal Grandfather    Eczema Daughter     Social History   Tobacco Use   Smoking status: Former    Current packs/day: 0.00    Average packs/day: 1 pack/day for 20.0 years (20.0 ttl pk-yrs)    Types: Cigarettes    Start date: 06/19/1991    Quit date: 06/19/2011    Years since quitting: 11.9   Smokeless tobacco: Never  Vaping Use   Vaping status: Never Used  Substance Use Topics   Alcohol use: Not Currently   Drug use: No    Review of Systems:   ROS  Objective:   BP 110/70 (BP Location: Left Arm, Patient Position: Sitting, Cuff Size: Large)   Pulse 71   Temp 97.8 F (36.6 C) (Temporal)   Ht 5\' 7"  (1.702 m)   Wt 230 lb 6.1 oz (104.5 kg)   LMP 05/28/2023 (Approximate)   SpO2 97%   BMI 36.08 kg/m  Body mass index is 36.08 kg/m.   General Appearance:    Alert, cooperative, no distress, appears stated age  Head:    Normocephalic, without obvious abnormality, atraumatic  Eyes:    PERRL, conjunctiva/corneas clear, EOM's intact, fundi    benign, both eyes  Ears:    Normal TM's and external ear canals, both ears  Nose:   Nares normal, septum midline, mucosa normal, no drainage    or sinus tenderness  Throat:   Lips, mucosa, and tongue normal; teeth and gums normal  Neck:   Supple, symmetrical, trachea midline, no adenopathy;    thyroid:  no enlargement/tenderness/nodules; no carotid   bruit or JVD  Back:     Symmetric, no curvature, ROM normal, no CVA  tenderness  Lungs:     Clear to auscultation bilaterally, respirations unlabored  Chest Wall:    No tenderness or deformity   Heart:    Regular rate and rhythm, S1 and S2 normal, no murmur, rub or gallop  Breast Exam:    Deferred  Abdomen:     Soft, non-tender, bowel sounds active all four quadrants,    no masses,  no organomegaly  Genitalia:    Deferred  Extremities:   Extremities normal, atraumatic, no cyanosis or edema  Pulses:   2+ and symmetric all extremities  Skin:   Skin color, texture, turgor normal, no rashes or lesions  Lymph nodes:   Cervical, supraclavicular, and axillary nodes normal  Neurologic:   CNII-XII intact, normal strength, sensation and reflexes    throughout    Assessment/Plan:   Routine physical examination Today patient counseled on age appropriate routine health concerns for screening and prevention, each reviewed and up to date or declined. Immunizations reviewed and up to date or declined. Labs ordered and reviewed. Risk factors for depression reviewed and negative. Hearing function and visual acuity are intact. ADLs screened and addressed as needed. Functional ability and level of safety reviewed and appropriate. Education, counseling and referrals performed based on assessed risks today. Patient provided with a copy of personalized plan for preventive services.  Bipolar 1 disorder (HCC) Reviewed Overall stable - management per psychiatry Denies any active concerns, Suicidal ideation/HI  Mixed hyperlipidemia Update lipid panel and provide recommendations accordingly - already on max dose of crestor  Pre-diabetes Update A1c Start metformin 500 mg XR daily, increase to 1000 mg XR daily after two weeks if indicated  Obesity, unspecified classification, unspecified obesity type, unspecified whether serious comorbidity present Continue healthy lifestyle efforts  I,Emily Lagle,acting as a scribe for [employer], PA.,have documented all relevant  documentation on the behalf of Jarold Motto, PA,as directed by  Jarold Motto, PA while in the presence of Jarold Motto, [place].  I, Jarold Motto, [place], have reviewed all documentation for this visit. The documentation on 06/11/23 for the exam, diagnosis, procedures, and orders are all accurate and complete.  Jarold Motto, PA-C [facility]

## 2023-06-11 NOTE — Patient Instructions (Signed)
It was great to see you!  Restart Metformin 500 mg XR daily After 2 weeks, may increase to 1000 mg XR daily if tolerated  I'm placing new referral for colonoscopy -- they may send you a MyChart message to schedule -- be on the lookout for this.  Let's follow-up in 6 months  Take care,  Jarold Motto PA-C

## 2023-06-12 ENCOUNTER — Ambulatory Visit: Payer: Medicare Other | Admitting: Physician Assistant

## 2023-06-17 ENCOUNTER — Telehealth: Payer: Self-pay | Admitting: Physician Assistant

## 2023-06-17 ENCOUNTER — Other Ambulatory Visit: Payer: Self-pay

## 2023-06-17 NOTE — Telephone Encounter (Signed)
Canceled Rx at [facility], pended for WM.

## 2023-06-17 NOTE — Telephone Encounter (Signed)
Patient lvm stating the Adderall is too expensive at the [facility] pharmacy. She would like the refill at the [facility] in [place] instead. Called pt to get the exact location with no answer.  Appointment scheduled for 07/23/23 Contact  [phone]

## 2023-06-18 ENCOUNTER — Other Ambulatory Visit: Payer: Self-pay | Admitting: Physician Assistant

## 2023-06-18 MED ORDER — AMPHETAMINE-DEXTROAMPHET ER 10 MG PO CP24: 10.0000 mg | ORAL_CAPSULE | Freq: Two times a day (BID) | ORAL | 0 refills | Status: DC

## 2023-06-20 ENCOUNTER — Other Ambulatory Visit: Payer: Self-pay | Admitting: Physician Assistant

## 2023-06-23 ENCOUNTER — Other Ambulatory Visit: Payer: Self-pay | Admitting: Physician Assistant

## 2023-06-23 NOTE — Progress Notes (Signed)
   Rubin Payor, PhD, LAT, ATC acting as a scribe for Clementeen Graham, MD.  Crystal Bean is a 47 y.o. female who presents to [facility] at [facility] today for bilat hand paresthesia. Pt was last seen by Dr. Arville Care a similar complaints on 02/13/22, limited to the R arm.  Today, pt c/o bilat hand paresthesia ongoing for over a month. Pt locates pain to ***  Neck pain:  Radiates: Paresthesia: yes Grip strength: Aggravates: Treatments tried: carpal tunnel wrist brace, gabapentin  Dx imaging: 02/11/22 C-spine XR  Pertinent review of systems: ***  Relevant historical information: ***   Exam:  LMP 05/28/2023 (Approximate)  General: Well Developed, well nourished, and in no acute distress.   MSK: ***    Lab and Radiology Results No results found for this or any previous visit (from the past 72 hour(s)). No results found.     Assessment and Plan: 47 y.o. female with ***   PDMP not reviewed this encounter. No orders of the defined types were placed in this encounter.  No orders of the defined types were placed in this encounter.    Discussed warning signs or symptoms. Please see discharge instructions. Patient expresses understanding.   ***

## 2023-06-24 ENCOUNTER — Ambulatory Visit (INDEPENDENT_AMBULATORY_CARE_PROVIDER_SITE_OTHER): Payer: Medicare Other | Admitting: Family Medicine

## 2023-06-24 ENCOUNTER — Encounter: Payer: Self-pay | Admitting: Cardiology

## 2023-06-24 ENCOUNTER — Ambulatory Visit: Payer: Medicare Other | Attending: Cardiology | Admitting: Cardiology

## 2023-06-24 ENCOUNTER — Encounter: Payer: Self-pay | Admitting: Family Medicine

## 2023-06-24 VITALS — BP 112/80 | HR 72 | Ht 67.0 in | Wt 232.2 lb

## 2023-06-24 VITALS — BP 102/78 | HR 67 | Ht 67.0 in | Wt 232.6 lb

## 2023-06-24 DIAGNOSIS — I10 Essential (primary) hypertension: Secondary | ICD-10-CM | POA: Diagnosis not present

## 2023-06-24 DIAGNOSIS — I2510 Atherosclerotic heart disease of native coronary artery without angina pectoris: Secondary | ICD-10-CM

## 2023-06-24 DIAGNOSIS — E785 Hyperlipidemia, unspecified: Secondary | ICD-10-CM | POA: Diagnosis present

## 2023-06-24 DIAGNOSIS — G5603 Carpal tunnel syndrome, bilateral upper limbs: Secondary | ICD-10-CM | POA: Diagnosis not present

## 2023-06-24 DIAGNOSIS — M5412 Radiculopathy, cervical region: Secondary | ICD-10-CM

## 2023-06-24 MED ORDER — NITROGLYCERIN 0.4 MG SL SUBL: 0.4000 mg | SUBLINGUAL_TABLET | SUBLINGUAL | 12 refills | Status: AC | PRN

## 2023-06-24 NOTE — Addendum Note (Signed)
Addended by: Freddi Starr on: 06/24/2023 09:33 AM   Modules accepted: Orders

## 2023-06-24 NOTE — Patient Instructions (Signed)

## 2023-06-24 NOTE — Patient Instructions (Signed)
Thank you for coming in today.   Plan for nerve test with neurology.   You should hear soon about scheduling.   Recheck with me once we get the results back.

## 2023-06-30 ENCOUNTER — Other Ambulatory Visit: Payer: Self-pay | Admitting: Cardiology

## 2023-07-13 ENCOUNTER — Other Ambulatory Visit: Payer: Self-pay | Admitting: Cardiology

## 2023-07-15 ENCOUNTER — Ambulatory Visit (INDEPENDENT_AMBULATORY_CARE_PROVIDER_SITE_OTHER): Payer: Medicare Other | Admitting: Physician Assistant

## 2023-07-15 ENCOUNTER — Encounter: Payer: Self-pay | Admitting: Physician Assistant

## 2023-07-15 DIAGNOSIS — F4310 Post-traumatic stress disorder, unspecified: Secondary | ICD-10-CM

## 2023-07-15 DIAGNOSIS — F319 Bipolar disorder, unspecified: Secondary | ICD-10-CM | POA: Diagnosis not present

## 2023-07-15 DIAGNOSIS — F902 Attention-deficit hyperactivity disorder, combined type: Secondary | ICD-10-CM | POA: Diagnosis not present

## 2023-07-15 DIAGNOSIS — F411 Generalized anxiety disorder: Secondary | ICD-10-CM | POA: Diagnosis not present

## 2023-07-15 MED ORDER — LORAZEPAM 0.5 MG PO TABS: 0.2500 mg | ORAL_TABLET | Freq: Three times a day (TID) | ORAL | 0 refills | Status: DC | PRN

## 2023-07-15 NOTE — Progress Notes (Signed)
[facility]  Patient ID: Crystal Bean,  MRN: [id number]  PCP: Jarold Motto, PA  Date of Evaluation: 07/15/2023 Time spent:20 minutes  Chief Complaint:  Chief Complaint   Anxiety; Depression; ADHD; Follow-up    HISTORY/CURRENT STATUS: HPI For routine med check.  We restarted Abilify about 6 weeks ago. She increased to 10 mg about 10 days ago.  Mood is 'a little more stable' but she still gets angry easily, is more talkative and talks over people, more irritable and isn't usually like that. Her husband has mentioned it too.  Patient denies increased energy with decreased need for sleep, racing thoughts, impulsivity or risky behaviors, increased spending, increased libido, grandiosity, paranoia, or hallucinations.  States she has her moments where she gets sad but it may only last 10 or 15 minutes and then goes away. Patient is able to enjoy things.  Energy and motivation are good most of the time.   No extreme sadness, tearfulness, or feelings of hopelessness.  Sleeps fairly well.  ADLs and personal hygiene are normal.   Denies any changes in concentration, making decisions, or remembering things.  Appetite has not changed.  Weight is stable.  Denies suicidal or homicidal thoughts.  She still gets anxious at times but not too often.  She pushes through it most of the time.  She has an old bottle of Xanax which she takes rarely.  Even with breaking the pill in half, 0.5 mg, it makes her sleepy into the next day.  She does not like to take it.  Not having panic attacks but just gets overwhelmed, feels uneasy like something bad is going to happen at any moment.  She asks about Ativan.  Her mother-in-law takes it and states it works well for her.  Denies dizziness, syncope, seizures, numbness, tingling, tremor, tics, unsteady gait, slurred speech, confusion. Denies muscle or joint pain, stiffness, or dystonia. Denies unexplained weight loss, frequent infections, or sores that heal  slowly.  No polyphagia, polydipsia, or polyuria. Denies visual changes or paresthesias.   Individual Medical History/ Review of Systems: Changes? :No    Past medications for mental health diagnoses include: Prozac, Celexa, Lexapro, Wellbutrin, Effexor XR, Pristiq, Abilify,  Adderall, Ritalin, Klonopin, Xanax, Gabapentin, Buspar  Allergies: Gabapentin, Prednisone, Meloxicam, and Penicillins  Current Medications:  Current Outpatient Medications:    amphetamine-dextroamphetamine (ADDERALL XR) 10 MG 24 hr capsule, Take 1 capsule (10 mg total) by mouth 2 (two) times daily with breakfast and lunch., Disp: 60 capsule, Rfl: 0   [START ON 08/10/2023] amphetamine-dextroamphetamine (ADDERALL XR) 10 MG 24 hr capsule, Take 1 capsule (10 mg total) by mouth 2 (two) times daily with breakfast and lunch., Disp: 60 capsule, Rfl: 0   amphetamine-dextroamphetamine (ADDERALL XR) 10 MG 24 hr capsule, TAKE 1 CAPSULE BY MOUTH TWICE DAILY WITH BREAKFAST AND WITH LUNCH, Disp: 60 capsule, Rfl: 0   ARIPiprazole (ABILIFY) 10 MG tablet, Take 10 mg by mouth daily., Disp: , Rfl:    aspirin EC 81 MG EC tablet, Take 1 tablet (81 mg total) by mouth daily. Swallow whole., Disp: 30 tablet, Rfl: 11   buPROPion (WELLBUTRIN XL) 300 MG 24 hr tablet, Take 1 tablet (300 mg total) by mouth daily., Disp: 90 tablet, Rfl: 3   Cholecalciferol (VITAMIN D3) 10 MCG (400 UNIT) CAPS, Take by mouth., Disp: , Rfl:    diclofenac (VOLTAREN) 75 MG EC tablet, Take 1 tablet by mouth twice daily, Disp: 60 tablet, Rfl: 0   esomeprazole (NEXIUM) 20 MG capsule,  Take 20 mg by mouth daily at 12 noon., Disp: , Rfl:    gabapentin (NEURONTIN) 100 MG capsule, TAKE 1 CAPSULE BY MOUTH THREE TIMES DAILY, Disp: 90 capsule, Rfl: 1   ketoconazole (NIZORAL) 2 % cream, APPLY TO AFFECTED AREA(S) ONCE TO TWICE DAILY, Disp: 30 g, Rfl: 0   lisinopril (ZESTRIL) 2.5 MG tablet, Take 1 tablet by mouth once daily, Disp: 90 tablet, Rfl: 3   LORazepam (ATIVAN) 0.5 MG tablet, Take  0.5-2 tablets (0.25-1 mg total) by mouth every 8 (eight) hours as needed for anxiety., Disp: 60 tablet, Rfl: 0   metFORMIN (GLUCOPHAGE-XR) 500 MG 24 hr tablet, Start 1 tablet daily x 2 weeks, then increase to 2 tablets if tolerated, Disp: 180 tablet, Rfl: 1   metoprolol succinate (TOPROL-XL) 25 MG 24 hr tablet, Take 1 tablet by mouth once daily, Disp: 90 tablet, Rfl: 3   Multiple Vitamin (MULTIVITAMIN) tablet, Take 1 tablet by mouth daily., Disp: , Rfl:    nitroGLYCERIN (NITROSTAT) 0.4 MG SL tablet, Place 1 tablet (0.4 mg total) under the tongue every 5 (five) minutes x 3 doses as needed for chest pain., Disp: 25 tablet, Rfl: 12   Omega-3 Fatty Acids (FISH OIL PO), Take by mouth., Disp: , Rfl:    rosuvastatin (CRESTOR) 40 MG tablet, Take 1 tablet by mouth once daily, Disp: 90 tablet, Rfl: 3   triamcinolone ointment (KENALOG) 0.5 %, Apply to affected area 1-2 times daily, Disp: 15 g, Rfl: 0   venlafaxine XR (EFFEXOR-XR) 150 MG 24 hr capsule, Take 2 capsules (300 mg total) by mouth daily with breakfast., Disp: 180 capsule, Rfl: 3 Medication Side Effects: none  Family Medical/ Social History: Changes? no  MENTAL HEALTH EXAM:  There were no vitals taken for this visit.There is no height or weight on file to calculate BMI.  General Appearance: Casual, Well Groomed and Obese  Eye Contact:  Good  Speech:  Clear and Coherent and Talkative  Volume:  Normal  Mood:  Euthymic  Affect:  Congruent  Thought Process:  Goal Directed and Descriptions of Associations: Circumstantial  Orientation:  Full (Time, Place, and Person)  Thought Content: Logical   Suicidal Thoughts:  No  Homicidal Thoughts:  No  Memory:  WNL  Judgement:  Good  Insight:  Good  Psychomotor Activity:  Normal  Concentration:  Concentration: Good and Attention Span: Good  Recall:  Good  Fund of Knowledge: Good  Language: Good  Assets:  Desire for Improvement Financial Resources/Insurance Housing Transportation  ADL's:  Intact   Cognition: WNL  Prognosis:  Good   DIAGNOSES:    ICD-10-CM   1. Generalized anxiety disorder  F41.1     2. Bipolar I disorder (HCC)  F31.9     3. PTSD (post-traumatic stress disorder)  F43.10     4. Attention deficit hyperactivity disorder (ADHD), combined type  F90.2        Receiving Psychotherapy: Yes  Alba Cory   RECOMMENDATIONS:  PDMP was reviewed.  Last Adderall filled 06/26/2023.  Gabapentin filled 06/11/2023. I provided 20 minutes of face to face time during this encounter, including time spent before and after the visit in records review, medical decision making, counseling pertinent to today's visit, and charting.   We discussed the anxiety.  It is fine to try Ativan.  She knows not to take it with the Adderall because 1 is an upper, 1 is a downer.  She takes the Xanax so rarely now that I am not concerned about  that.  We discussed the pros and cons of Ativan versus Xanax.  She would like to try it.  We discussed the Abilify.  I recommend she stay on the present dose for another 3 to 4 weeks before we decide if an increase is needed. D/C Xanax  Continue Adderall XR 10 mg, 1 p.o. at breakfast and 1 p.o. at lunch. Continue Abilify 10 mg, 1 p.o. every morning. Continue Wellbutrin XL 300 mg, 1 p.o. daily. Continue gabapentin 100 mg, 1-3 daily as directed. Start Ativan 0.5 mg, 1/2-2 p.o. 3 times daily as needed anxiety. Continue Effexor XR 150 mg, 2 p.o. every morning. Continue therapy. Return in 4 weeks.  Melony Overly, PA-C

## 2023-07-18 ENCOUNTER — Telehealth: Payer: Self-pay | Admitting: Physician Assistant

## 2023-07-18 NOTE — Telephone Encounter (Signed)
Patient requests to be called re: Patient received an invoice for $23.45 for Physical done on  06/11/23, states should be covered 100%. Requests explanation.

## 2023-07-22 ENCOUNTER — Other Ambulatory Visit: Payer: Self-pay | Admitting: Physician Assistant

## 2023-07-23 ENCOUNTER — Ambulatory Visit (INDEPENDENT_AMBULATORY_CARE_PROVIDER_SITE_OTHER): Payer: Medicare Other | Admitting: Physician Assistant

## 2023-07-23 NOTE — Telephone Encounter (Signed)
Sent email over to coding.

## 2023-07-30 ENCOUNTER — Other Ambulatory Visit: Payer: Self-pay | Admitting: Physician Assistant

## 2023-07-30 DIAGNOSIS — Z0000 Encounter for general adult medical examination without abnormal findings: Secondary | ICD-10-CM

## 2023-08-09 ENCOUNTER — Other Ambulatory Visit: Payer: Self-pay | Admitting: Physician Assistant

## 2023-08-11 MED ORDER — KETOCONAZOLE 2 % EX CREA: TOPICAL_CREAM | CUTANEOUS | 0 refills | Status: DC

## 2023-08-11 MED ORDER — TRIAMCINOLONE ACETONIDE 0.5 % EX OINT: TOPICAL_OINTMENT | CUTANEOUS | 0 refills | Status: DC

## 2023-08-13 ENCOUNTER — Telehealth: Payer: Self-pay | Admitting: Physician Assistant

## 2023-08-13 ENCOUNTER — Telehealth: Payer: Medicare Other | Admitting: Physician Assistant

## 2023-08-13 ENCOUNTER — Encounter: Payer: Self-pay | Admitting: Physician Assistant

## 2023-08-13 DIAGNOSIS — F422 Mixed obsessional thoughts and acts: Secondary | ICD-10-CM

## 2023-08-13 DIAGNOSIS — F902 Attention-deficit hyperactivity disorder, combined type: Secondary | ICD-10-CM

## 2023-08-13 DIAGNOSIS — F4310 Post-traumatic stress disorder, unspecified: Secondary | ICD-10-CM | POA: Diagnosis not present

## 2023-08-13 DIAGNOSIS — F411 Generalized anxiety disorder: Secondary | ICD-10-CM

## 2023-08-13 DIAGNOSIS — F319 Bipolar disorder, unspecified: Secondary | ICD-10-CM | POA: Diagnosis not present

## 2023-08-13 MED ORDER — LORAZEPAM 1 MG PO TABS
0.5000 mg | ORAL_TABLET | Freq: Three times a day (TID) | ORAL | 0 refills | Status: DC | PRN
Start: 2023-08-13 — End: 2023-09-24

## 2023-08-13 NOTE — Telephone Encounter (Signed)
Pt was seen today. [facility] [address] in [place]

## 2023-08-13 NOTE — Progress Notes (Addendum)
[facility]  Patient ID: Crystal Bean,  MRN: [id number]  PCP: Jarold Motto, PA  Date of Evaluation: 08/13/2023 Time spent:25 minutes  Chief Complaint:  Chief Complaint   Anxiety; Depression; Follow-up   Virtual Visit via Telehealth  I connected with patient by a video enabled telemedicine application with their informed consent, and verified patient privacy and that I am speaking with the correct person using two identifiers.  I am private, in my office and the patient is at home.  I discussed the limitations, risks, security and privacy concerns of performing an evaluation and management service by video and the availability of in person appointments. I also discussed with the patient that there may be a patient responsible charge related to this service. The patient expressed understanding and agreed to proceed.   I discussed the assessment and treatment plan with the patient. The patient was provided an opportunity to ask questions and all were answered. The patient agreed with the plan and demonstrated an understanding of the instructions.   The patient was advised to call back or seek an in-person evaluation if the symptoms worsen or if the condition fails to improve as anticipated.  I provided 25 minutes of non-face-to-face time during this encounter.  HISTORY/CURRENT STATUS: HPI For routine med check.  At the last visit we changed Xanax to Ativan.  It helped some initially but now is thought as beneficial.  She still does not need something very often but sometimes she will get panicky and need the Ativan to help ward off a severe panic attack.  She does not take it near the same time of the Adderall.  Patient is able to enjoy things.  Energy and motivation are fair to good depending on the day.  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  No extreme sadness, tearfulness, or feelings of hopelessness.  Sleeps well most of  the time. ADLs and personal hygiene are normal.  No change in memory.  Appetite has not changed.  Weight is stable.   Denies suicidal or homicidal thoughts.  The Adderall is still very helpful.  States that attention is good without easy distractibility.  Able to focus on things and finish tasks to completion.   Patient denies increased energy with decreased need for sleep, increased talkativeness, racing thoughts, impulsivity or risky behaviors, increased spending, increased libido, grandiosity, increased irritability or anger, paranoia, or hallucinations.  Denies dizziness, syncope, seizures, numbness, tingling, tremor, tics, unsteady gait, slurred speech, confusion. Denies muscle or joint pain, stiffness, or dystonia. Denies unexplained weight loss, frequent infections, or sores that heal slowly.  No polyphagia, polydipsia, or polyuria. Denies visual changes or paresthesias.   Individual Medical History/ Review of Systems: Changes? :No    Past medications for mental health diagnoses include: Prozac, Celexa, Lexapro, Wellbutrin, Effexor XR, Pristiq, Abilify,  Adderall, Ritalin, Klonopin, Xanax, Gabapentin, Buspar  Allergies: Gabapentin, Prednisone, Meloxicam, and Penicillins  Current Medications:  Current Outpatient Medications:    amphetamine-dextroamphetamine (ADDERALL XR) 10 MG 24 hr capsule, Take 1 capsule (10 mg total) by mouth 2 (two) times daily with breakfast and lunch., Disp: 60 capsule, Rfl: 0   amphetamine-dextroamphetamine (ADDERALL XR) 10 MG 24 hr capsule, Take 1 capsule (10 mg total) by mouth 2 (two) times daily with breakfast and lunch., Disp: 60 capsule, Rfl: 0   amphetamine-dextroamphetamine (ADDERALL XR) 10 MG 24 hr capsule, TAKE 1 CAPSULE BY MOUTH TWICE DAILY WITH BREAKFAST AND WITH LUNCH, Disp:  60 capsule, Rfl: 0   ARIPiprazole (ABILIFY) 10 MG tablet, Take 10 mg by mouth daily., Disp: , Rfl:    aspirin EC 81 MG EC tablet, Take 1 tablet (81 mg total) by mouth daily. Swallow  whole., Disp: 30 tablet, Rfl: 11   buPROPion (WELLBUTRIN XL) 300 MG 24 hr tablet, Take 1 tablet (300 mg total) by mouth daily., Disp: 90 tablet, Rfl: 3   Cholecalciferol (VITAMIN D3) 10 MCG (400 UNIT) CAPS, Take by mouth., Disp: , Rfl:    diclofenac (VOLTAREN) 75 MG EC tablet, Take 1 tablet by mouth twice daily, Disp: 60 tablet, Rfl: 0   esomeprazole (NEXIUM) 20 MG capsule, Take 20 mg by mouth daily at 12 noon., Disp: , Rfl:    gabapentin (NEURONTIN) 100 MG capsule, TAKE 1 CAPSULE BY MOUTH THREE TIMES DAILY, Disp: 90 capsule, Rfl: 1   ketoconazole (NIZORAL) 2 % cream, APPLY TO AFFECTED AREA(S) ONCE TO TWICE DAILY, Disp: 30 g, Rfl: 0   lisinopril (ZESTRIL) 2.5 MG tablet, Take 1 tablet by mouth once daily, Disp: 90 tablet, Rfl: 3   LORazepam (ATIVAN) 1 MG tablet, Take 0.5-1 tablets (0.5-1 mg total) by mouth every 8 (eight) hours as needed for anxiety., Disp: 60 tablet, Rfl: 0   metFORMIN (GLUCOPHAGE-XR) 500 MG 24 hr tablet, Start 1 tablet daily x 2 weeks, then increase to 2 tablets if tolerated, Disp: 180 tablet, Rfl: 1   metoprolol succinate (TOPROL-XL) 25 MG 24 hr tablet, Take 1 tablet by mouth once daily, Disp: 90 tablet, Rfl: 3   Multiple Vitamin (MULTIVITAMIN) tablet, Take 1 tablet by mouth daily., Disp: , Rfl:    Omega-3 Fatty Acids (FISH OIL PO), Take by mouth., Disp: , Rfl:    rosuvastatin (CRESTOR) 40 MG tablet, Take 1 tablet by mouth once daily, Disp: 90 tablet, Rfl: 3   triamcinolone ointment (KENALOG) 0.5 %, Apply to affected area 1-2 times daily, Disp: 15 g, Rfl: 0   venlafaxine XR (EFFEXOR-XR) 150 MG 24 hr capsule, Take 2 capsules (300 mg total) by mouth daily with breakfast., Disp: 180 capsule, Rfl: 3   nitroGLYCERIN (NITROSTAT) 0.4 MG SL tablet, Place 1 tablet (0.4 mg total) under the tongue every 5 (five) minutes x 3 doses as needed for chest pain. (Patient not taking: Reported on 08/13/2023), Disp: 25 tablet, Rfl: 12 Medication Side Effects: none  Family Medical/ Social History:  Changes? no  MENTAL HEALTH EXAM:  There were no vitals taken for this visit.There is no height or weight on file to calculate BMI.  General Appearance: Casual, Well Groomed and Obese  Eye Contact:  Good  Speech:  Clear and Coherent, Normal Rate, and Talkative  Volume:  Normal  Mood:  Euthymic  Affect:  Congruent  Thought Process:  Goal Directed and Descriptions of Associations: Circumstantial  Orientation:  Full (Time, Place, and Person)  Thought Content: Logical   Suicidal Thoughts:  No  Homicidal Thoughts:  No  Memory:  WNL  Judgement:  Good  Insight:  Good  Psychomotor Activity:  Normal  Concentration:  Concentration: Good and Attention Span: Good  Recall:  Good  Fund of Knowledge: Good  Language: Good  Assets:  Desire for Improvement Financial Resources/Insurance Housing Transportation  ADL's:  Intact  Cognition: WNL  Prognosis:  Good   DIAGNOSES:    ICD-10-CM   1. Generalized anxiety disorder  F41.1     2. Bipolar I disorder (HCC)  F31.9     3. PTSD (post-traumatic stress disorder)  F43.10  4. Attention deficit hyperactivity disorder (ADHD), combined type  F90.2     5. Mixed obsessional thoughts and acts  F42.2      Receiving Psychotherapy: Yes  Alba Cory   RECOMMENDATIONS:  PDMP was reviewed.  Last Ativan filled 07/15/2023.  Adderall filled 06/26/2023.  Gabapentin known to me. I provided 25 minutes of non-face-to-face time during this encounter, including time spent before and after the visit in records review, medical decision making, counseling pertinent to today's visit, and charting.   We discussed the anxiety and ways to prevent it such as decreasing caffeine, use tools that she has learned in therapy to help so she will not need to take the Ativan.  She knows not to mix that with the Adderall.  I explained that 1 is an upper, 1 is a downer and they cancel each other out if taken too close together.  She verbalizes understanding.  The Ativan does not  seem to be as effective as it was so I recommend increasing the dose.  She would like to try it.  Continue Adderall XR 10 mg, 1 p.o. at breakfast and 1 p.o. at lunch. Continue Abilify 10 mg, 1 p.o. every morning. Continue Wellbutrin XL 300 mg, 1 p.o. daily. Continue gabapentin 100 mg, 1-3 daily as directed. Increase Ativan to 1 mg, 1/2-1 p.o. every 8 hours as needed anxiety.  Take sparingly.   Continue Effexor XR 150 mg, 2 p.o. every morning. Continue therapy. Return in 6 weeks.  Melony Overly, PA-C

## 2023-08-14 ENCOUNTER — Other Ambulatory Visit: Payer: Self-pay

## 2023-08-14 MED ORDER — AMPHETAMINE-DEXTROAMPHET ER 10 MG PO CP24: 10.0000 mg | ORAL_CAPSULE | Freq: Two times a day (BID) | ORAL | 0 refills | Status: DC

## 2023-08-14 NOTE — Telephone Encounter (Signed)
[facility] already has a Rx on file at [facility] dated 06/18/2023 with a fill date of 08/10/23 Last refill at [facility] was 06/26/23. Please contact [facility] if Adderall available if not please cancel Rx then pend for review to be [facility].

## 2023-08-14 NOTE — Telephone Encounter (Signed)
Called pt to verify pharmacy;  [id number] [place], [place] - [address] AT [address]EXT TO [facility]   Pended

## 2023-08-14 NOTE — Telephone Encounter (Signed)
Spoke with [employer], they are on back order for Adderall. Canceled rx.

## 2023-08-14 NOTE — Telephone Encounter (Signed)
Pended.

## 2023-08-28 NOTE — Telephone Encounter (Signed)
Coding confirmed bill was from 20% coinsurance.  I have left patient vm in regard.

## 2023-09-03 ENCOUNTER — Other Ambulatory Visit: Payer: Self-pay | Admitting: Physician Assistant

## 2023-09-06 ENCOUNTER — Other Ambulatory Visit: Payer: Self-pay | Admitting: Family Medicine

## 2023-09-06 ENCOUNTER — Other Ambulatory Visit: Payer: Self-pay | Admitting: Physician Assistant

## 2023-09-09 NOTE — Telephone Encounter (Signed)
Originally prescribed by PCP, but logical to refill based on her s/s that she has seen Dr. Denyse Amass for. Rx refilled.

## 2023-09-15 ENCOUNTER — Other Ambulatory Visit: Payer: Self-pay

## 2023-09-15 ENCOUNTER — Ambulatory Visit
Admission: RE | Admit: 2023-09-15 | Discharge: 2023-09-15 | Disposition: A | Discharge status: Home / Self Care | Payer: Medicare Other | Source: Ambulatory Visit | Attending: Physician Assistant | Admitting: Physician Assistant

## 2023-09-15 ENCOUNTER — Telehealth: Payer: Self-pay | Admitting: Physician Assistant

## 2023-09-15 DIAGNOSIS — Z0000 Encounter for general adult medical examination without abnormal findings: Secondary | ICD-10-CM

## 2023-09-15 MED ORDER — AMPHETAMINE-DEXTROAMPHET ER 10 MG PO CP24: 10.0000 mg | ORAL_CAPSULE | Freq: Two times a day (BID) | ORAL | 0 refills | Status: DC

## 2023-09-15 NOTE — Telephone Encounter (Signed)
PENDED

## 2023-09-15 NOTE — Telephone Encounter (Signed)
Pt LVM 10/27 @ 9:55p to request refill of Adderall to   [id number], [place] - [address] AT [address]EXT TO [facility] [address] [place] [address] Phone: [phone]  Fax: [phone]   Next appt 11/6

## 2023-09-16 ENCOUNTER — Other Ambulatory Visit: Payer: Self-pay

## 2023-09-16 NOTE — Telephone Encounter (Signed)
[facility]

## 2023-09-16 NOTE — Telephone Encounter (Signed)
Pt called at 2:48p.  Adderall was sent to [facility] She said it's about $15 cheaper [facility] so she's asking for [place] to send it there instead.    Pls prioritize since this was our error.  Next appt 11/6

## 2023-09-17 MED ORDER — AMPHETAMINE-DEXTROAMPHET ER 10 MG PO CP24: 10.0000 mg | ORAL_CAPSULE | Freq: Two times a day (BID) | ORAL | 0 refills | Status: DC

## 2023-09-22 ENCOUNTER — Telehealth: Payer: Self-pay | Admitting: Physician Assistant

## 2023-09-22 ENCOUNTER — Other Ambulatory Visit: Payer: Self-pay

## 2023-09-22 NOTE — Telephone Encounter (Signed)
Pended.

## 2023-09-22 NOTE — Telephone Encounter (Signed)
[facility]. Please send it to [facility] in [place]

## 2023-09-23 MED ORDER — AMPHETAMINE-DEXTROAMPHET ER 10 MG PO CP24: 10.0000 mg | ORAL_CAPSULE | Freq: Two times a day (BID) | ORAL | 0 refills | Status: DC

## 2023-09-24 ENCOUNTER — Ambulatory Visit (HOSPITAL_BASED_OUTPATIENT_CLINIC_OR_DEPARTMENT_OTHER): Payer: Medicare Other | Admitting: Student

## 2023-09-24 ENCOUNTER — Encounter: Payer: Self-pay | Admitting: Physician Assistant

## 2023-09-24 ENCOUNTER — Encounter (HOSPITAL_BASED_OUTPATIENT_CLINIC_OR_DEPARTMENT_OTHER): Payer: Self-pay | Admitting: Orthopaedic Surgery

## 2023-09-24 ENCOUNTER — Telehealth (INDEPENDENT_AMBULATORY_CARE_PROVIDER_SITE_OTHER): Payer: Medicare Other | Admitting: Physician Assistant

## 2023-09-24 DIAGNOSIS — M5412 Radiculopathy, cervical region: Secondary | ICD-10-CM | POA: Diagnosis not present

## 2023-09-24 DIAGNOSIS — F319 Bipolar disorder, unspecified: Secondary | ICD-10-CM

## 2023-09-24 DIAGNOSIS — F4310 Post-traumatic stress disorder, unspecified: Secondary | ICD-10-CM

## 2023-09-24 DIAGNOSIS — F411 Generalized anxiety disorder: Secondary | ICD-10-CM | POA: Diagnosis not present

## 2023-09-24 DIAGNOSIS — F902 Attention-deficit hyperactivity disorder, combined type: Secondary | ICD-10-CM

## 2023-09-24 DIAGNOSIS — G5603 Carpal tunnel syndrome, bilateral upper limbs: Secondary | ICD-10-CM

## 2023-09-24 MED ORDER — ARIPIPRAZOLE 10 MG PO TABS: 10.0000 mg | ORAL_TABLET | Freq: Every day | ORAL | 1 refills | Status: DC

## 2023-09-24 MED ORDER — AMPHETAMINE-DEXTROAMPHET ER 10 MG PO CP24: 10.0000 mg | ORAL_CAPSULE | Freq: Two times a day (BID) | ORAL | 0 refills | Status: DC

## 2023-09-24 MED ORDER — LORAZEPAM 1 MG PO TABS: 0.5000 mg | ORAL_TABLET | Freq: Three times a day (TID) | ORAL | 2 refills | Status: DC | PRN
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
## 2023-09-24 NOTE — Progress Notes (Signed)
[facility] Med Check  Patient ID: Crystal Bean,  MRN: [id number]  PCP: Jarold Motto, PA  Date of Evaluation: 09/24/2023 Time spent:20 minutes  Chief Complaint:  Chief Complaint   Anxiety; Depression; ADHD; Follow-up    Virtual Visit via Telehealth  I connected with patient by a video enabled telemedicine application with their informed consent, and verified patient privacy and that I am speaking with the correct person using two identifiers.  I am private, in my office and the patient is at home.  I discussed the limitations, risks, security and privacy concerns of performing an evaluation and management service by video and the availability of in person appointments. I also discussed with the patient that there may be a patient responsible charge related to this service. The patient expressed understanding and agreed to proceed.   I discussed the assessment and treatment plan with the patient. The patient was provided an opportunity to ask questions and all were answered. The patient agreed with the plan and demonstrated an understanding of the instructions.   The patient was advised to call back or seek an in-person evaluation if the symptoms worsen or if the condition fails to improve as anticipated.  I provided 20 minutes of non-face-to-face time during this encounter.  HISTORY/CURRENT STATUS: HPI For routine med check.  Doing well. Increasing the Ativan helped. She tries not to take it unless absolutely necessary. No PA, but feels like she might have one if she doesn't take it. She doesn't take it around the same time she takes the Adderall.  Patient is able to enjoy things.  Energy and motivation are good.  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  No extreme sadness, tearfulness, or feelings of hopelessness.  Sleeps well most of the time. ADLs and personal hygiene are normal. Appetite has not changed.   Weight is stable.  Denies laxative use, calorie restricting, or binging and purging.   Denies cutting or any form of self-harm.  Denies suicidal or homicidal thoughts.  States that attention is good without easy distractibility.  Able to focus on things and finish tasks to completion.   Patient denies increased energy with decreased need for sleep, increased talkativeness, racing thoughts, impulsivity or risky behaviors, increased spending, increased libido, grandiosity, increased irritability or anger, paranoia, or hallucinations.  Denies dizziness, syncope, seizures, numbness, tingling, tremor, tics, unsteady gait, slurred speech, confusion. Denies muscle or joint pain, stiffness, or dystonia. Denies unexplained weight loss, frequent infections, or sores that heal slowly.  No polyphagia, polydipsia, or polyuria. Denies visual changes or paresthesias.   Individual Medical History/ Review of Systems: Changes? :No    Past medications for mental health diagnoses include: Prozac, Celexa, Lexapro, Wellbutrin, Effexor XR, Pristiq, Abilify,  Adderall, Ritalin, Klonopin, Xanax, Gabapentin, Buspar  Allergies: Gabapentin, Prednisone, Meloxicam, and Penicillins  Current Medications:  Current Outpatient Medications:    aspirin EC 81 MG EC tablet, Take 1 tablet (81 mg total) by mouth daily. Swallow whole., Disp: 30 tablet, Rfl: 11   buPROPion (WELLBUTRIN XL) 300 MG 24 hr tablet, Take 1 tablet (300 mg total) by mouth daily., Disp: 90 tablet, Rfl: 3   Cholecalciferol (VITAMIN D3) 10 MCG (400 UNIT) CAPS, Take by mouth., Disp: , Rfl:    diclofenac (VOLTAREN) 75 MG EC tablet, Take 1 tablet by mouth twice daily, Disp: 60 tablet, Rfl: 0   esomeprazole (NEXIUM) 20 MG capsule, Take 20 mg by mouth daily at  12 noon., Disp: , Rfl:    gabapentin (NEURONTIN) 300 MG capsule, TAKE 1 CAPSULE BY MOUTH THREE TIMES DAILY, Disp: 90 capsule, Rfl: 0   lisinopril (ZESTRIL) 2.5 MG tablet, Take 1 tablet by mouth once daily, Disp:  90 tablet, Rfl: 3   metFORMIN (GLUCOPHAGE-XR) 500 MG 24 hr tablet, Start 1 tablet daily x 2 weeks, then increase to 2 tablets if tolerated, Disp: 180 tablet, Rfl: 1   metoprolol succinate (TOPROL-XL) 25 MG 24 hr tablet, Take 1 tablet by mouth once daily, Disp: 90 tablet, Rfl: 3   Multiple Vitamin (MULTIVITAMIN) tablet, Take 1 tablet by mouth daily., Disp: , Rfl:    Omega-3 Fatty Acids (FISH OIL PO), Take by mouth., Disp: , Rfl:    rosuvastatin (CRESTOR) 40 MG tablet, Take 1 tablet by mouth once daily, Disp: 90 tablet, Rfl: 3   triamcinolone ointment (KENALOG) 0.5 %, Apply to affected area 1-2 times daily, Disp: 15 g, Rfl: 0   venlafaxine XR (EFFEXOR-XR) 150 MG 24 hr capsule, TAKE 2 CAPSULES BY MOUTH EVERY DAY WITH BREAKFAST, Disp: 180 capsule, Rfl: 1   amphetamine-dextroamphetamine (ADDERALL XR) 10 MG 24 hr capsule, Take 1 capsule (10 mg total) by mouth 2 (two) times daily with breakfast and lunch., Disp: 60 capsule, Rfl: 0   [START ON 10/23/2023] amphetamine-dextroamphetamine (ADDERALL XR) 10 MG 24 hr capsule, Take 1 capsule (10 mg total) by mouth 2 (two) times daily with breakfast and lunch., Disp: 60 capsule, Rfl: 0   [START ON 11/22/2023] amphetamine-dextroamphetamine (ADDERALL XR) 10 MG 24 hr capsule, Take 1 capsule (10 mg total) by mouth 2 (two) times daily with breakfast and lunch., Disp: 60 capsule, Rfl: 0   ARIPiprazole (ABILIFY) 10 MG tablet, Take 1 tablet (10 mg total) by mouth daily., Disp: 90 tablet, Rfl: 1   gabapentin (NEURONTIN) 100 MG capsule, TAKE 1 CAPSULE BY MOUTH THREE TIMES DAILY (Patient not taking: Reported on 09/24/2023), Disp: 90 capsule, Rfl: 1   ketoconazole (NIZORAL) 2 % cream, APPLY TO AFFECTED AREA(S) ONCE TO TWICE DAILY (Patient not taking: Reported on 09/24/2023), Disp: 30 g, Rfl: 0   LORazepam (ATIVAN) 1 MG tablet, Take 0.5-1 tablets (0.5-1 mg total) by mouth every 8 (eight) hours as needed for anxiety., Disp: 60 tablet, Rfl: 2   nitroGLYCERIN (NITROSTAT) 0.4 MG SL tablet,  Place 1 tablet (0.4 mg total) under the tongue every 5 (five) minutes x 3 doses as needed for chest pain. (Patient not taking: Reported on 08/13/2023), Disp: 25 tablet, Rfl: 12 Medication Side Effects: none  Family Medical/ Social History: Changes? no  MENTAL HEALTH EXAM:  There were no vitals taken for this visit.There is no height or weight on file to calculate BMI.  General Appearance: Casual, Well Groomed and Obese  Eye Contact:  Good  Speech:  Clear and Coherent and Normal Rate  Volume:  Normal  Mood:  Euthymic  Affect:  Congruent  Thought Process:  Goal Directed and Descriptions of Associations: Circumstantial  Orientation:  Full (Time, Place, and Person)  Thought Content: Logical   Suicidal Thoughts:  No  Homicidal Thoughts:  No  Memory:  WNL  Judgement:  Good  Insight:  Good  Psychomotor Activity:  Normal  Concentration:  Concentration: Good and Attention Span: Good  Recall:  Good  Fund of Knowledge: Good  Language: Good  Assets:  Communication Skills Desire for Improvement Financial Resources/Insurance Housing Transportation  ADL's:  Intact  Cognition: WNL  Prognosis:  Good   DIAGNOSES:    ICD-10-CM  1. Bipolar I disorder (HCC)  F31.9     2. Generalized anxiety disorder  F41.1     3. PTSD (post-traumatic stress disorder)  F43.10     4. Attention deficit hyperactivity disorder (ADHD), combined type  F90.2       Receiving Psychotherapy: Yes  Alba Cory   RECOMMENDATIONS:  PDMP was reviewed.  Last Ativan filled 08/13/2023.  Gabapentin filled 09/10/2023.  Adderall filled 08/14/2023. I provided 20 minutes of face to non-face time during this encounter, including time spent before and after the visit in records review, medical decision making, counseling pertinent to today's visit, and charting.   She's doing well so no changes need to be made.   Continue Adderall XR 10 mg, 1 p.o. at breakfast and 1 p.o. at lunch. Continue Abilify 10 mg, 1 p.o. every  morning. Continue Wellbutrin XL 300 mg, 1 p.o. daily. Continue gabapentin 100 mg, 1-3 daily as directed. Continue Ativan 1 mg, 1/2-1 p.o. every 8 hours as needed anxiety.  Take sparingly.   Continue Effexor XR 150 mg, 2 p.o. every morning. Continue therapy. Return in 3 months.   Melony Overly, PA-C

## 2023-09-28 ENCOUNTER — Encounter: Payer: Self-pay | Admitting: Physician Assistant

## 2023-10-05 ENCOUNTER — Other Ambulatory Visit: Payer: Self-pay | Admitting: Physician Assistant

## 2023-10-13 ENCOUNTER — Encounter: Payer: Self-pay | Admitting: Obstetrics and Gynecology

## 2023-10-13 ENCOUNTER — Ambulatory Visit (INDEPENDENT_AMBULATORY_CARE_PROVIDER_SITE_OTHER): Payer: Medicare Other | Admitting: Obstetrics and Gynecology

## 2023-10-13 VITALS — BP 104/78 | HR 67 | Ht 66.0 in | Wt 228.0 lb

## 2023-10-13 DIAGNOSIS — Z1211 Encounter for screening for malignant neoplasm of colon: Secondary | ICD-10-CM

## 2023-10-13 DIAGNOSIS — Z124 Encounter for screening for malignant neoplasm of cervix: Secondary | ICD-10-CM | POA: Diagnosis not present

## 2023-10-13 DIAGNOSIS — Z3049 Encounter for surveillance of other contraceptives: Secondary | ICD-10-CM | POA: Diagnosis not present

## 2023-10-13 DIAGNOSIS — Z9189 Other specified personal risk factors, not elsewhere classified: Secondary | ICD-10-CM

## 2023-10-13 DIAGNOSIS — R6882 Decreased libido: Secondary | ICD-10-CM

## 2023-10-13 DIAGNOSIS — Z01419 Encounter for gynecological examination (general) (routine) without abnormal findings: Secondary | ICD-10-CM | POA: Insufficient documentation

## 2023-10-13 DIAGNOSIS — Z3009 Encounter for other general counseling and advice on contraception: Secondary | ICD-10-CM

## 2023-10-13 MED ORDER — PHEXXI 1.8-1-0.4 % VA GEL: 1.0000 | VAGINAL | 11 refills | Status: DC | PRN

## 2023-10-13 NOTE — Patient Instructions (Signed)

## 2023-10-13 NOTE — Assessment & Plan Note (Signed)
Cervical cancer screening performed according to ASCCP guidelines. Encouraged annual mammogram screening Colonoscopy declined, consider cologuard Labs and immunizations with her primary Encouraged safe sexual practices as indicated Encouraged healthy lifestyle practices with diet and exercise For patients under 48yo, I recommend 1000mg  calcium daily and 600IU of vitamin D daily.

## 2023-10-13 NOTE — Assessment & Plan Note (Addendum)
Estrogen therapy contraindicated Discussed testosterone and addyi Addyi does have interactions with abilify, gabapentin, and ativan. Will consider. Encouraged increased physical activity.
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
## 2023-10-24 ENCOUNTER — Telehealth: Payer: Self-pay | Admitting: Physician Assistant

## 2023-10-24 ENCOUNTER — Other Ambulatory Visit: Payer: Self-pay

## 2023-10-24 MED ORDER — AMPHETAMINE-DEXTROAMPHET ER 10 MG PO CP24: 10.0000 mg | ORAL_CAPSULE | Freq: Two times a day (BID) | ORAL | 0 refills | Status: DC

## 2023-10-24 NOTE — Telephone Encounter (Signed)
Pt called at 9:45a.  They do not have the dosage of Adderall at [facility]. Pls send current script and the one for Jan [facility] to [facility]  [address] [place], [place] [address].  She is asking for her script for Jan to be sent there also because [facility] says they haven't had that dosage in a several month and don't know when they will get it in.  Next appt 2/5

## 2023-10-24 NOTE — Telephone Encounter (Signed)
Canceled Adderall scripts at [facility], repended to [facility].

## 2023-11-04 ENCOUNTER — Other Ambulatory Visit: Payer: Self-pay | Admitting: Physician Assistant

## 2023-11-05 LAB — COLOGUARD

## 2023-11-07 NOTE — Addendum Note (Signed)
Addended by: Rosalyn Gess on: 11/07/2023 09:34 AM   Modules accepted: Orders

## 2023-11-13 ENCOUNTER — Encounter: Payer: Self-pay | Admitting: Cardiology

## 2023-11-29 ENCOUNTER — Other Ambulatory Visit: Payer: Self-pay | Admitting: Family Medicine

## 2023-11-29 ENCOUNTER — Other Ambulatory Visit: Payer: Self-pay | Admitting: Physician Assistant

## 2023-11-30 LAB — COLOGUARD: COLOGUARD: NEGATIVE

## 2023-12-01 MED ORDER — KETOCONAZOLE 2 % EX CREA: TOPICAL_CREAM | CUTANEOUS | 0 refills | Status: DC

## 2023-12-01 MED ORDER — TRIAMCINOLONE ACETONIDE 0.5 % EX OINT: TOPICAL_OINTMENT | CUTANEOUS | 0 refills | Status: DC

## 2023-12-01 MED ORDER — GABAPENTIN 300 MG PO CAPS: 300.0000 mg | ORAL_CAPSULE | Freq: Three times a day (TID) | ORAL | 0 refills | Status: DC | PRN

## 2023-12-03 ENCOUNTER — Other Ambulatory Visit: Payer: Self-pay | Admitting: Physician Assistant

## 2023-12-11 ENCOUNTER — Ambulatory Visit: Payer: Medicare Other | Admitting: Physician Assistant

## 2023-12-24 ENCOUNTER — Encounter: Payer: Self-pay | Admitting: Physician Assistant

## 2023-12-24 ENCOUNTER — Ambulatory Visit: Payer: Medicare Other | Admitting: Physician Assistant

## 2023-12-24 ENCOUNTER — Encounter: Payer: Self-pay | Admitting: Cardiology

## 2023-12-24 VITALS — BP 110/70 | HR 64 | Temp 97.4°F | Ht 66.0 in | Wt 231.4 lb

## 2023-12-24 DIAGNOSIS — M1990 Unspecified osteoarthritis, unspecified site: Secondary | ICD-10-CM

## 2023-12-24 DIAGNOSIS — F319 Bipolar disorder, unspecified: Secondary | ICD-10-CM | POA: Diagnosis not present

## 2023-12-24 DIAGNOSIS — K219 Gastro-esophageal reflux disease without esophagitis: Secondary | ICD-10-CM

## 2023-12-24 DIAGNOSIS — R1310 Dysphagia, unspecified: Secondary | ICD-10-CM

## 2023-12-24 DIAGNOSIS — F4310 Post-traumatic stress disorder, unspecified: Secondary | ICD-10-CM

## 2023-12-24 DIAGNOSIS — F411 Generalized anxiety disorder: Secondary | ICD-10-CM | POA: Diagnosis not present

## 2023-12-24 DIAGNOSIS — F902 Attention-deficit hyperactivity disorder, combined type: Secondary | ICD-10-CM | POA: Diagnosis not present

## 2023-12-24 MED ORDER — BUPROPION HCL ER (XL) 300 MG PO TB24: 300.0000 mg | ORAL_TABLET | Freq: Every day | ORAL | 3 refills | Status: DC

## 2023-12-24 MED ORDER — AMPHETAMINE-DEXTROAMPHET ER 10 MG PO CP24: 10.0000 mg | ORAL_CAPSULE | Freq: Two times a day (BID) | ORAL | 0 refills | Status: DC

## 2023-12-24 MED ORDER — VENLAFAXINE HCL ER 150 MG PO CP24: 300.0000 mg | ORAL_CAPSULE | Freq: Every day | ORAL | 1 refills | Status: DC

## 2023-12-24 MED ORDER — CELECOXIB 200 MG PO CAPS: 200.0000 mg | ORAL_CAPSULE | Freq: Two times a day (BID) | ORAL | 1 refills | Status: DC

## 2023-12-24 MED ORDER — LORAZEPAM 1 MG PO TABS: 0.5000 mg | ORAL_TABLET | Freq: Three times a day (TID) | ORAL | 2 refills | Status: DC | PRN

## 2023-12-24 MED ORDER — PANTOPRAZOLE SODIUM 40 MG PO TBEC: 40.0000 mg | DELAYED_RELEASE_TABLET | Freq: Every day | ORAL | 3 refills | Status: DC

## 2023-12-24 NOTE — Progress Notes (Signed)
 [facility] Med Check  Patient ID: Crystal Bean,  MRN: [id number]  PCP: Job Lukes, PA  Date of Evaluation: 12/24/2023 Time spent:20 minutes  Chief Complaint:  Chief Complaint   Anxiety; Depression; ADD; Follow-up    HISTORY/CURRENT STATUS: HPI For routine med check.  Doing well mentally.  Patient is able to enjoy things.  Energy and motivation are good.  No extreme sadness, tearfulness, or feelings of hopelessness.  Sleeps well most of the time.  Wakes up sometimes with pain.  ADLs and personal hygiene are normal.   Appetite has not changed.  Weight is stable.  Anxiety is controlled, she usually only takes Ativan  at night.  Denies suicidal or homicidal thoughts.  The Adderall does not work quite as well as it used to but it is working well enough.  Most of the time her attention is good without easy distractibility.  Able to focus on things and finish tasks to completion.   Patient denies increased energy with decreased need for sleep, increased talkativeness, racing thoughts, impulsivity or risky behaviors, increased spending, increased libido, grandiosity, increased irritability or anger, paranoia, or hallucinations.  Denies dizziness, syncope, seizures, numbness, tingling, tremor, tics, unsteady gait, slurred speech, confusion. Denies muscle or joint pain, stiffness, or dystonia. Denies unexplained weight loss, frequent infections, or sores that heal slowly.  No polyphagia, polydipsia, or polyuria. Denies visual changes or paresthesias.   Individual Medical History/ Review of Systems: Changes? :No  saw PCP this morning. Med changes: celebrex  and protonix    Past medications for mental health diagnoses include: Prozac, Celexa, Lexapro, Wellbutrin , Effexor  XR, Pristiq, Abilify ,  Adderall, Ritalin, Klonopin, Xanax , Gabapentin , Buspar  Allergies: Gabapentin , Prednisone , Meloxicam, and Penicillins  Current Medications:  Current Outpatient Medications:    ARIPiprazole   (ABILIFY ) 10 MG tablet, Take 1 tablet (10 mg total) by mouth daily., Disp: 90 tablet, Rfl: 1   aspirin  EC 81 MG EC tablet, Take 1 tablet (81 mg total) by mouth daily. Swallow whole., Disp: 30 tablet, Rfl: 11   celecoxib  (CELEBREX ) 200 MG capsule, Take 1 capsule (200 mg total) by mouth 2 (two) times daily., Disp: 60 capsule, Rfl: 1   Cholecalciferol (VITAMIN D3) 10 MCG (400 UNIT) CAPS, Take by mouth., Disp: , Rfl:    gabapentin  (NEURONTIN ) 300 MG capsule, Take 1 capsule (300 mg total) by mouth 3 (three) times daily as needed (nerve pain)., Disp: 90 capsule, Rfl: 0   ketoconazole  (NIZORAL ) 2 % cream, APPLY TO AFFECTED AREA(S) ONCE TO TWICE DAILY, Disp: 30 g, Rfl: 0   lisinopril  (ZESTRIL ) 2.5 MG tablet, Take 1 tablet by mouth once daily, Disp: 90 tablet, Rfl: 3   metFORMIN  (GLUCOPHAGE -XR) 500 MG 24 hr tablet, TAKE 1 TABLET BY MOUTH ONCE DAILY FOR 14 DAYS THEN  INCREASE  TO  2  TABS  DAILY  IF  TOLERATED (Patient taking differently: 1,000 mg daily with breakfast. TAKE 1 TABLET BY MOUTH ONCE DAILY FOR 14 DAYS THEN  INCREASE  TO  2  TABS  DAILY  IF  TOLERATED), Disp: 180 tablet, Rfl: 0   metoprolol  succinate (TOPROL -XL) 25 MG 24 hr tablet, Take 1 tablet by mouth once daily, Disp: 90 tablet, Rfl: 3   Multiple Vitamin (MULTIVITAMIN) tablet, Take 1 tablet by mouth daily., Disp: , Rfl:    Omega-3 Fatty Acids (FISH OIL PO), Take by mouth., Disp: , Rfl:    pantoprazole  (PROTONIX ) 40 MG tablet, Take 1 tablet (40 mg total) by mouth daily., Disp: 30 tablet, Rfl: 3   rosuvastatin  (CRESTOR )  40 MG tablet, Take 1 tablet by mouth once daily, Disp: 90 tablet, Rfl: 3   triamcinolone  ointment (KENALOG ) 0.5 %, Apply to affected area 1-2 times daily, Disp: 15 g, Rfl: 0   [START ON 12/31/2023] amphetamine -dextroamphetamine  (ADDERALL XR) 10 MG 24 hr capsule, Take 1 capsule (10 mg total) by mouth 2 (two) times daily with breakfast and lunch., Disp: 60 capsule, Rfl: 0   [START ON 01/27/2024] amphetamine -dextroamphetamine  (ADDERALL  XR) 10 MG 24 hr capsule, Take 1 capsule (10 mg total) by mouth 2 (two) times daily with breakfast and lunch., Disp: 60 capsule, Rfl: 0   [START ON 02/26/2024] amphetamine -dextroamphetamine  (ADDERALL XR) 10 MG 24 hr capsule, Take 1 capsule (10 mg total) by mouth 2 (two) times daily with breakfast and lunch., Disp: 60 capsule, Rfl: 0   buPROPion  (WELLBUTRIN  XL) 300 MG 24 hr tablet, Take 1 tablet (300 mg total) by mouth daily., Disp: 90 tablet, Rfl: 3   LORazepam  (ATIVAN ) 1 MG tablet, Take 0.5-1 tablets (0.5-1 mg total) by mouth every 8 (eight) hours as needed for anxiety., Disp: 60 tablet, Rfl: 2   nitroGLYCERIN  (NITROSTAT ) 0.4 MG SL tablet, Place 1 tablet (0.4 mg total) under the tongue every 5 (five) minutes x 3 doses as needed for chest pain. (Patient not taking: Reported on 12/24/2023), Disp: 25 tablet, Rfl: 12   venlafaxine  XR (EFFEXOR -XR) 150 MG 24 hr capsule, Take 2 capsules (300 mg total) by mouth daily., Disp: 180 capsule, Rfl: 1 Medication Side Effects: none  Family Medical/ Social History: Changes? no  MENTAL HEALTH EXAM:  There were no vitals taken for this visit.There is no height or weight on file to calculate BMI.  General Appearance: Casual, Well Groomed and Obese  Eye Contact:  Good  Speech:  Clear and Coherent and Normal Rate  Volume:  Normal  Mood:  Euthymic  Affect:  Congruent  Thought Process:  Goal Directed and Descriptions of Associations: Circumstantial  Orientation:  Full (Time, Place, and Person)  Thought Content: Logical   Suicidal Thoughts:  No  Homicidal Thoughts:  No  Memory:  WNL  Judgement:  Good  Insight:  Good  Psychomotor Activity:  Normal  Concentration:  Concentration: Good and Attention Span: Good  Recall:  Good  Fund of Knowledge: Good  Language: Good  Assets:  Communication Skills Desire for Improvement Financial Resources/Insurance Housing Resilience Transportation  ADL's:  Intact  Cognition: WNL  Prognosis:  Good   DIAGNOSES:     ICD-10-CM   1. Bipolar I disorder (HCC)  F31.9     2. PTSD (post-traumatic stress disorder)  F43.10     3. Attention deficit hyperactivity disorder (ADHD), combined type  F90.2     4. Generalized anxiety disorder  F41.1       Receiving Psychotherapy: Yes  Darryle Boos   RECOMMENDATIONS:  PDMP was reviewed.  Adderall filled 12/01/2023.  Gabapentin  filled 12/01/2023.  Last Ativan  08/13/2023. I provided 20 minutes of face to face time during this encounter, including time spent before and after the visit in records review, medical decision making, counseling pertinent to today's visit, and charting.   We briefly discussed increasing the Adderall but because of her heart condition we both preferred to leave the dose the same.  It does help most of the time.  No changes will be made.  Continue Adderall XR 10 mg, 1 p.o. at breakfast and 1 p.o. at lunch. Continue Abilify  10 mg, 1 p.o. every morning. Continue Wellbutrin  XL 300 mg, 1 p.o.  daily. Continue gabapentin  100 mg, 1-3 daily as directed. Continue Ativan  1 mg, 1/2-1 p.o. every 8 hours as needed anxiety.  Take sparingly.   Continue Effexor  XR 150 mg, 2 p.o. every morning. Continue therapy. Return in 4 months.   Verneita Cooks, PA-C

## 2023-12-24 NOTE — Patient Instructions (Signed)
 It was great to see you!  For your pain Consider tylenol  arthritis Switch out diclofenac  for Celebrex  and see if this makes a difference We could also consider increasing gabapentin  at night -- message me if you'd like to try this, we could increase by 100 mg capsules Consider Salon Pas patches or other OTC (available over the counter without a prescription) lidocaine  patches  For your swallowing concerns Hold omeprazole and trial protonix  Gastroenterology referral   Let's follow-up in July for Comprehensive Physical Exam (CPE) preventive care annual visit, sooner if you have concerns.  Take care,  Lucie Buttner PA-C

## 2023-12-24 NOTE — Progress Notes (Signed)
 Crystal Bean is a 48 y.o. female here for a follow up of a pre-existing problem.  History of Present Illness:   Chief Complaint  Patient presents with   Pain Management    Pt c/o waking up 2 to 6 times a night due to pain. Pt wants to know if can increase Diclofenac .   Dysphagia    Pt c/o trouble swallowing x 6 months.    HPI  Dysphagia  Complains of dysphagia with liquids that has persisted over the past 6 months. Occasionally with her saliva as well.  She states that she often has to turn her head in a certain position to be able to swallow. Reports compliance and good tolerance of Nexium  20 mg daily. Would experience reflux without it.  Denies any black stool, abdominal pain, coughing blood, or other GI symptoms.   Wt Readings from Last 3 Encounters:  12/24/23 231 lb 6.1 oz (105 kg)  10/13/23 228 lb (103.4 kg)  06/24/23 232 lb 9.6 oz (105.5 kg)    Pain Management  She states that her pain tends to be worse during daytime. Pain is mostly located around her neck region. She was following up PT but had to cancel appointment due to insurance.  she complains of waking up 2-6x a night due to her pain, right side worse than left.  She's stating that her pain is more intense and tends to be more joint related. Associated symptoms include headaches.  Currently taking Diclofenac  75 mg. Did try meloxicam but experienced rashes without. Tylenol  was cuing rebound headaches. No other NSAID use.  She is currently taking gabapentin  300 mg twice daily for the past 2 weeks, was unable to tolerate 3x daily as she was experiencing mood side effects. Taking it twice daily seems to somewhat relief her pain.  Never used lidocaine  patches before.    Past Medical History:  Diagnosis Date   ADHD (attention deficit hyperactivity disorder)    Anxiety    B12 deficiency    Back pain    Bipolar 1 disorder (HCC)    Bulging lumbar disc    C. difficile diarrhea 04/24/2016   treated with metronidazole    Constipation    Depression    GERD (gastroesophageal reflux disease)    History of chicken pox    History of shingles 2013   Hypertension    IBS (irritable bowel syndrome)    Joint pain    Major depressive disorder    Migraines    Obesity    Obsessive-compulsive disorder    Osteoarthritis    Prediabetes    PTSD (post-traumatic stress disorder)    Sleep apnea    SOB (shortness of breath)      Social History   Tobacco Use   Smoking status: Former    Current packs/day: 0.00    Average packs/day: 1 pack/day for 20.0 years (20.0 ttl pk-yrs)    Types: Cigarettes    Start date: 06/19/1991    Quit date: 06/19/2011    Years since quitting: 12.5   Smokeless tobacco: Never  Vaping Use   Vaping status: Never Used  Substance Use Topics   Alcohol use: Not Currently   Drug use: No    Past Surgical History:  Procedure Laterality Date   BLADDER SURGERY  1981   Urethera stretched   caps on teeth  1980   Caps on all Teeth   CESAREAN SECTION  2003   CORONARY STENT INTERVENTION N/A 12/28/2021   Procedure: CORONARY  STENT INTERVENTION;  Surgeon: Jordan, Peter M, MD;  Location: [facility] INVASIVE CV LAB;  Service: Cardiovascular;  Laterality: N/A;   LEFT HEART CATH AND CORONARY ANGIOGRAPHY N/A 12/28/2021   Procedure: LEFT HEART CATH AND CORONARY ANGIOGRAPHY;  Surgeon: Jordan, Peter M, MD;  Location: [facility] INVASIVE CV LAB;  Service: Cardiovascular;  Laterality: N/A;   TONSILLECTOMY     WISDOM TOOTH EXTRACTION      Family History  Problem Relation Age of Onset   Osteoporosis Mother    Depression Mother    Obesity Mother    Hearing loss Father    Obesity Father    Healthy Sister    CVA Maternal Grandmother    Heart disease Maternal Grandmother    Arthritis Paternal Grandmother    Hearing loss Paternal Grandmother    Dementia Paternal Grandmother    Dementia Paternal Grandfather    Eczema Daughter     Allergies  Allergen Reactions   Gabapentin  Other (See Comments)    Pt can not  tolerated large dose give her mood swings   Prednisone  Other (See Comments)    Severe mood swings   Meloxicam Rash   Penicillins Rash    Current Medications:   Current Outpatient Medications:    amphetamine -dextroamphetamine  (ADDERALL XR) 10 MG 24 hr capsule, Take 1 capsule (10 mg total) by mouth 2 (two) times daily with breakfast and lunch., Disp: 60 capsule, Rfl: 0   amphetamine -dextroamphetamine  (ADDERALL XR) 10 MG 24 hr capsule, Take 1 capsule (10 mg total) by mouth 2 (two) times daily with breakfast and lunch., Disp: 60 capsule, Rfl: 0   amphetamine -dextroamphetamine  (ADDERALL XR) 10 MG 24 hr capsule, Take 1 capsule (10 mg total) by mouth 2 (two) times daily with breakfast and lunch., Disp: 60 capsule, Rfl: 0   ARIPiprazole  (ABILIFY ) 10 MG tablet, Take 1 tablet (10 mg total) by mouth daily., Disp: 90 tablet, Rfl: 1   aspirin  EC 81 MG EC tablet, Take 1 tablet (81 mg total) by mouth daily. Swallow whole., Disp: 30 tablet, Rfl: 11   buPROPion  (WELLBUTRIN  XL) 300 MG 24 hr tablet, Take 1 tablet (300 mg total) by mouth daily., Disp: 90 tablet, Rfl: 3   celecoxib  (CELEBREX ) 200 MG capsule, Take 1 capsule (200 mg total) by mouth 2 (two) times daily., Disp: 60 capsule, Rfl: 1   Cholecalciferol (VITAMIN D3) 10 MCG (400 UNIT) CAPS, Take by mouth., Disp: , Rfl:    gabapentin  (NEURONTIN ) 300 MG capsule, Take 1 capsule (300 mg total) by mouth 3 (three) times daily as needed (nerve pain)., Disp: 90 capsule, Rfl: 0   ketoconazole  (NIZORAL ) 2 % cream, APPLY TO AFFECTED AREA(S) ONCE TO TWICE DAILY, Disp: 30 g, Rfl: 0   lisinopril  (ZESTRIL ) 2.5 MG tablet, Take 1 tablet by mouth once daily, Disp: 90 tablet, Rfl: 3   LORazepam  (ATIVAN ) 1 MG tablet, Take 0.5-1 tablets (0.5-1 mg total) by mouth every 8 (eight) hours as needed for anxiety., Disp: 60 tablet, Rfl: 2   metFORMIN  (GLUCOPHAGE -XR) 500 MG 24 hr tablet, TAKE 1 TABLET BY MOUTH ONCE DAILY FOR 14 DAYS THEN  INCREASE  TO  2  TABS  DAILY  IF  TOLERATED  (Patient taking differently: 1,000 mg daily with breakfast. TAKE 1 TABLET BY MOUTH ONCE DAILY FOR 14 DAYS THEN  INCREASE  TO  2  TABS  DAILY  IF  TOLERATED), Disp: 180 tablet, Rfl: 0   metoprolol  succinate (TOPROL -XL) 25 MG 24 hr tablet, Take 1 tablet by mouth once daily,  Disp: 90 tablet, Rfl: 3   Multiple Vitamin (MULTIVITAMIN) tablet, Take 1 tablet by mouth daily., Disp: , Rfl:    nitroGLYCERIN  (NITROSTAT ) 0.4 MG SL tablet, Place 1 tablet (0.4 mg total) under the tongue every 5 (five) minutes x 3 doses as needed for chest pain., Disp: 25 tablet, Rfl: 12   Omega-3 Fatty Acids (FISH OIL PO), Take by mouth., Disp: , Rfl:    pantoprazole  (PROTONIX ) 40 MG tablet, Take 1 tablet (40 mg total) by mouth daily., Disp: 30 tablet, Rfl: 3   rosuvastatin  (CRESTOR ) 40 MG tablet, Take 1 tablet by mouth once daily, Disp: 90 tablet, Rfl: 3   triamcinolone  ointment (KENALOG ) 0.5 %, Apply to affected area 1-2 times daily, Disp: 15 g, Rfl: 0   venlafaxine  XR (EFFEXOR -XR) 150 MG 24 hr capsule, TAKE 2 CAPSULES BY MOUTH EVERY DAY WITH BREAKFAST, Disp: 180 capsule, Rfl: 1   Review of Systems:   Review of Systems  Gastrointestinal:  Negative for abdominal pain and melena.       +dysphagia   Musculoskeletal:  Positive for joint pain, myalgias and neck pain.  Neurological:  Positive for headaches.    Vitals:   Vitals:   12/24/23 1012  BP: 110/70  Pulse: 64  Temp: (!) 97.4 F (36.3 C)  TempSrc: Temporal  SpO2: 99%  Weight: 231 lb 6.1 oz (105 kg)  Height: 5' 6 (1.676 m)     Body mass index is 37.35 kg/m.  Physical Exam:   Physical Exam Vitals and nursing note reviewed.  Constitutional:      General: She is not in acute distress.    Appearance: She is well-developed. She is not ill-appearing or toxic-appearing.  Cardiovascular:     Rate and Rhythm: Normal rate and regular rhythm.     Pulses: Normal pulses.     Heart sounds: Normal heart sounds, S1 normal and S2 normal.  Pulmonary:     Effort:  Pulmonary effort is normal.     Breath sounds: Normal breath sounds.  Skin:    General: Skin is warm and dry.  Neurological:     Mental Status: She is alert.     GCS: GCS eye subscore is 4. GCS verbal subscore is 5. GCS motor subscore is 6.  Psychiatric:        Speech: Speech normal.        Behavior: Behavior normal. Behavior is cooperative.     Assessment and Plan:   Dysphagia, unspecified type; Gastroesophageal reflux disease, unspecified whether esophagitis present Unclear etiology I do have concerns for worsening gastroesophageal reflux disease due to chronic NSAID use Will stop OTC (available over the counter without a prescription) proton pump inhibitor (PPI) stomach acid reducer and start protonix  40 mg daily Gastrointestinal referral placed If new/worsening symptom(s), she was instructed to reach out   Osteoarthritis, unspecified osteoarthritis type, unspecified site Uncontrolled Advised as follows: Consider tylenol  arthritis Switch out diclofenac  for Celebrex  and see if this makes a difference We could also consider increasing gabapentin  at night -- message me if you'd like to try this, we could increase by 100 mg capsules Consider Salon Pas patches or other OTC (available over the counter without a prescription) lidocaine  patches Follow-up as needed with me or Dr Joane   Lucie Buttner, PA-C  Crystal Bean,acting as a scribe for Lucie Buttner, PA.,have documented all relevant documentation on the behalf of Lucie Buttner, PA,as directed by  Lucie Buttner, PA while in the presence of Crystal Bean, Crystal Bean.  Crystal Lucie Buttner, PA, have reviewed all documentation for this visit. The documentation on 12/24/23 for the exam, diagnosis, procedures, and orders are all accurate and complete.

## 2023-12-25 ENCOUNTER — Other Ambulatory Visit: Payer: Self-pay | Admitting: Family Medicine

## 2024-01-16 ENCOUNTER — Encounter: Payer: Self-pay | Admitting: Physician Assistant

## 2024-01-16 DIAGNOSIS — K219 Gastro-esophageal reflux disease without esophagitis: Secondary | ICD-10-CM

## 2024-01-16 DIAGNOSIS — R1310 Dysphagia, unspecified: Secondary | ICD-10-CM

## 2024-01-28 ENCOUNTER — Encounter: Payer: Self-pay | Admitting: Physician Assistant

## 2024-02-07 ENCOUNTER — Encounter: Payer: Self-pay | Admitting: Physician Assistant

## 2024-02-08 ENCOUNTER — Other Ambulatory Visit: Payer: Self-pay | Admitting: Family Medicine

## 2024-02-08 ENCOUNTER — Other Ambulatory Visit: Payer: Self-pay | Admitting: Physician Assistant

## 2024-02-09 ENCOUNTER — Ambulatory Visit: Payer: Self-pay

## 2024-02-09 ENCOUNTER — Telehealth: Payer: Self-pay | Admitting: Physician Assistant

## 2024-02-09 NOTE — Telephone Encounter (Signed)
  Chief Complaint: Shortness of breath Symptoms: SOB with coughing-patient states SOB is moderate in nature but she has been able to complete physical activities without SOB Frequency: three to four days ago Pertinent Negatives: Patient denies fever or CP Disposition: [] ED /[] Urgent Care (no appt availability in office) / [x] Appointment(In office/virtual)/ []  [facility]/ [] Home Care/ [] Refused Recommended Disposition /[] [facility]/ []  Follow-up with PCP Additional Notes: patient was transferred by office to Nurse triage line. Patient endorses shortness of breath which she describes is moderate when coughing. Patient was speaking in full sentences on phone with no audible concerns. Patient endorses that she planted a tree over the weekend with no shortness of breath. Patient states she also feels like she can't take a full breath in. Patient was scheduled for an appointment for tomorrow with PCP prior to transfer to Nurse Triage. Per protocol, patient is recommended to be seen within four hours. Wanted to make PCP staff aware of protocol recommendations. Patient verbalized understanding of plan and all questions were answered.    Reason for Disposition  [1] MILD difficulty breathing (e.g., minimal/no SOB at rest, SOB with walking, pulse <100) AND [2] NEW-onset or WORSE than normal  Answer Assessment - Initial Assessment Questions 1. RESPIRATORY STATUS: "Describe your breathing?" (e.g., wheezing, shortness of breath, unable to speak, severe coughing)      Shortness of breath 2. ONSET: "When did this breathing problem begin?"      Started three to four days 3. PATTERN "Does the difficult breathing come and go, or has it been constant since it started?"      Constant since started 4. SEVERITY: "How bad is your breathing?" (e.g., mild, moderate, severe)    - MILD: No SOB at rest, mild SOB with walking, speaks normally in sentences, can lie down, no retractions, pulse < 100.     - MODERATE: SOB at rest, SOB with minimal exertion and prefers to sit, cannot lie down flat, speaks in phrases, mild retractions, audible wheezing, pulse 100-120.    - SEVERE: Very SOB at rest, speaks in single words, struggling to breathe, sitting hunched forward, retractions, pulse > 120      Moderate  5. RECURRENT SYMPTOM: "Have you had difficulty breathing before?" If Yes, ask: "When was the last time?" and "What happened that time?"      No 6. CARDIAC HISTORY: "Do you have any history of heart disease?" (e.g., heart attack, angina, bypass surgery, angioplasty)      MI 7. LUNG HISTORY: "Do you have any history of lung disease?"  (e.g., pulmonary embolus, asthma, emphysema)     no 8. CAUSE: "What do you think is causing the breathing problem?"      Believes she got a cold from her husband 9. OTHER SYMPTOMS: "Do you have any other symptoms? (e.g., dizziness, runny nose, cough, chest pain, fever)     coughing 10. O2 SATURATION MONITOR:  "Do you use an oxygen saturation monitor (pulse oximeter) at home?" If Yes, ask: "What is your reading (oxygen level) today?" "What is your usual oxygen saturation reading?" (e.g., 95%)       N/A 11. PREGNANCY: "Is there any chance you are pregnant?" "When was your last menstrual period?"       No 12. TRAVEL: "Have you traveled out of the country in the last month?" (e.g., travel history, exposures)       No  Protocols used: Breathing Difficulty-A-AH

## 2024-02-09 NOTE — Telephone Encounter (Signed)
Noted, pt scheduled tomorrow.

## 2024-02-09 NOTE — Telephone Encounter (Signed)
 noted

## 2024-02-09 NOTE — Telephone Encounter (Signed)
 FYI: This call has been transferred to triage nurse: the Triage Nurse. Once the result note has been entered staff can address the message at that time.  Patient called in with the following symptoms:  Red Word: Shortness of breath   Please advise at Mobile [phone] (mobile)  Message is routed to Provider Pool.

## 2024-02-09 NOTE — Telephone Encounter (Signed)
 Last OV 06/24/23 Next OV not scheduled  Last refill 12/25/23 Qty # 90/0  Gabapentin listed on allergy list, forwarding to Dr. Denyse Amass.

## 2024-02-10 ENCOUNTER — Ambulatory Visit (INDEPENDENT_AMBULATORY_CARE_PROVIDER_SITE_OTHER): Admitting: Physician Assistant

## 2024-02-10 ENCOUNTER — Encounter: Payer: Self-pay | Admitting: Physician Assistant

## 2024-02-10 VITALS — BP 110/70 | HR 62 | Temp 98.1°F | Ht 66.0 in | Wt 228.4 lb

## 2024-02-10 DIAGNOSIS — R052 Subacute cough: Secondary | ICD-10-CM

## 2024-02-10 MED ORDER — BENZONATATE 100 MG PO CAPS: 100.0000 mg | ORAL_CAPSULE | Freq: Three times a day (TID) | ORAL | 1 refills | Status: DC | PRN

## 2024-02-10 MED ORDER — ALBUTEROL SULFATE HFA 108 (90 BASE) MCG/ACT IN AERS: 2.0000 | INHALATION_SPRAY | Freq: Four times a day (QID) | RESPIRATORY_TRACT | 0 refills | Status: AC | PRN

## 2024-02-10 NOTE — Progress Notes (Signed)
 Crystal Bean is a 48 y.o. female here for a new problem.  History of Present Illness:   Chief Complaint  Patient presents with   Shortness of Breath    Pt c/o SOB and cough started on Friday, cleaned duct out few days before.     HPI  Shortness of breath: Pt complains of shortness of breath, cough, runny nose, and temporal headaches starting Friday 3/21.  Symptoms started after recent exposure to respiratory illness from her husband. Her symptoms have overall improved, but her lingering cough has persisted. She reports dizziness and lightheadedness when coughing.  Endorses some wheezing when coughing.  She notes cleaning out a dust duct a few days ago without wearing a mask.  Took 1 dose of Mucinex 2 days ago. Does not have an inhaler at home.   Denies any current back pain, rib pain.  No sore throat, fevers, or post-nasal drainage.   No ear pain, pressure, or popping.   Past Medical History:  Diagnosis Date   ADHD (attention deficit hyperactivity disorder)    Anxiety    B12 deficiency    Back pain    Bipolar 1 disorder (HCC)    Bulging lumbar disc    C. difficile diarrhea 04/24/2016   treated with metronidazole   Constipation    Depression    GERD (gastroesophageal reflux disease)    History of chicken pox    History of shingles 2013   Hypertension    IBS (irritable bowel syndrome)    Joint pain    Major depressive disorder    Migraines    Obesity    Obsessive-compulsive disorder    Osteoarthritis    Prediabetes    PTSD (post-traumatic stress disorder)    Sleep apnea    SOB (shortness of breath)      Social History   Tobacco Use   Smoking status: Former    Current packs/day: 0.00    Average packs/day: 1 pack/day for 20.0 years (20.0 ttl pk-yrs)    Types: Cigarettes    Start date: 06/19/1991    Quit date: 06/19/2011    Years since quitting: 12.6   Smokeless tobacco: Never  Vaping Use   Vaping status: Never Used  Substance Use Topics   Alcohol use: Not  Currently   Drug use: No    Past Surgical History:  Procedure Laterality Date   BLADDER SURGERY  1981   Urethera stretched   caps on teeth  1980   Caps on all Teeth   CESAREAN SECTION  2003   CORONARY STENT INTERVENTION N/A 12/28/2021   Procedure: CORONARY STENT INTERVENTION;  Surgeon: Swazil[place], Peter M, MD;  Location: [facility] INVASIVE CV LAB;  Service: Cardiovascular;  Laterality: N/A;   LEFT HEART CATH AND CORONARY ANGIOGRAPHY N/A 12/28/2021   Procedure: LEFT HEART CATH AND CORONARY ANGIOGRAPHY;  Surgeon: Swaziland, Peter M, MD;  Location: [facility] INVASIVE CV LAB;  Service: Cardiovascular;  Laterality: N/A;   TONSILLECTOMY     WISDOM TOOTH EXTRACTION      Family History  Problem Relation Age of Onset   Osteoporosis Mother    Depression Mother    Obesity Mother    Hearing loss Father    Obesity Father    Healthy Sister    CVA Maternal Grandmother    Heart disease Maternal Grandmother    Arthritis Paternal Grandmother    Hearing loss Paternal Grandmother    Dementia Paternal Grandmother    Dementia Paternal Grandfather    Eczema Daughter  Allergies  Allergen Reactions   Gabapentin Other (See Comments)    Pt can not tolerated large dose give her mood swings   Prednisone Other (See Comments)    Severe mood swings   Meloxicam Rash   Penicillins Rash    Current Medications:   Current Outpatient Medications:    albuterol (VENTOLIN HFA) 108 (90 Base) MCG/ACT inhaler, Inhale 2 puffs into the lungs every 6 (six) hours as needed for wheezing or shortness of breath., Disp: 8 g, Rfl: 0   amphetamine-dextroamphetamine (ADDERALL XR) 10 MG 24 hr capsule, Take 1 capsule (10 mg total) by mouth 2 (two) times daily with breakfast and lunch., Disp: 60 capsule, Rfl: 0   amphetamine-dextroamphetamine (ADDERALL XR) 10 MG 24 hr capsule, Take 1 capsule (10 mg total) by mouth 2 (two) times daily with breakfast and lunch., Disp: 60 capsule, Rfl: 0   [START ON 02/26/2024] amphetamine-dextroamphetamine  (ADDERALL XR) 10 MG 24 hr capsule, Take 1 capsule (10 mg total) by mouth 2 (two) times daily with breakfast and lunch., Disp: 60 capsule, Rfl: 0   ARIPiprazole (ABILIFY) 10 MG tablet, Take 1 tablet (10 mg total) by mouth daily., Disp: 90 tablet, Rfl: 1   aspirin EC 81 MG EC tablet, Take 1 tablet (81 mg total) by mouth daily. Swallow whole., Disp: 30 tablet, Rfl: 11   benzonatate (TESSALON PERLES) 100 MG capsule, Take 1 capsule (100 mg total) by mouth 3 (three) times daily as needed., Disp: 30 capsule, Rfl: 1   buPROPion (WELLBUTRIN XL) 300 MG 24 hr tablet, Take 1 tablet (300 mg total) by mouth daily., Disp: 90 tablet, Rfl: 3   celecoxib (CELEBREX) 200 MG capsule, Take 1 capsule by mouth twice daily, Disp: 60 capsule, Rfl: 0   Cholecalciferol (VITAMIN D3) 10 MCG (400 UNIT) CAPS, Take by mouth., Disp: , Rfl:    gabapentin (NEURONTIN) 300 MG capsule, TAKE 1 CAPSULE BY MOUTH THREE TIMES DAILY AS NEEDED FOR  NERVE  PAIN, Disp: 90 capsule, Rfl: 0   ketoconazole (NIZORAL) 2 % cream, APPLY TO AFFECTED AREA(S) ONCE TO TWICE DAILY, Disp: 30 g, Rfl: 0   lisinopril (ZESTRIL) 2.5 MG tablet, Take 1 tablet by mouth once daily, Disp: 90 tablet, Rfl: 3   LORazepam (ATIVAN) 1 MG tablet, Take 0.5-1 tablets (0.5-1 mg total) by mouth every 8 (eight) hours as needed for anxiety., Disp: 60 tablet, Rfl: 2   metFORMIN (GLUCOPHAGE-XR) 500 MG 24 hr tablet, TAKE 1 TABLET BY MOUTH ONCE DAILY FOR 14 DAYS THEN  INCREASE  TO  2  TABS  DAILY  IF  TOLERATED (Patient taking differently: 1,000 mg daily with breakfast. TAKE 1 TABLET BY MOUTH ONCE DAILY FOR 14 DAYS THEN  INCREASE  TO  2  TABS  DAILY  IF  TOLERATED), Disp: 180 tablet, Rfl: 0   metoprolol succinate (TOPROL-XL) 25 MG 24 hr tablet, Take 1 tablet by mouth once daily, Disp: 90 tablet, Rfl: 3   Multiple Vitamin (MULTIVITAMIN) tablet, Take 1 tablet by mouth daily., Disp: , Rfl:    nitroGLYCERIN (NITROSTAT) 0.4 MG SL tablet, Place 1 tablet (0.4 mg total) under the tongue every 5  (five) minutes x 3 doses as needed for chest pain., Disp: 25 tablet, Rfl: 12   Omega-3 Fatty Acids (FISH OIL PO), Take by mouth., Disp: , Rfl:    pantoprazole (PROTONIX) 40 MG tablet, Take 1 tablet (40 mg total) by mouth daily., Disp: 30 tablet, Rfl: 3   rosuvastatin (CRESTOR) 40 MG tablet, Take 1  tablet by mouth once daily, Disp: 90 tablet, Rfl: 3   triamcinolone ointment (KENALOG) 0.5 %, Apply to affected area 1-2 times daily, Disp: 15 g, Rfl: 0   venlafaxine XR (EFFEXOR-XR) 150 MG 24 hr capsule, Take 2 capsules (300 mg total) by mouth daily., Disp: 180 capsule, Rfl: 1   Review of Systems:   Negative unless otherwise specified per HPI.  Vitals:   Vitals:   02/10/24 1005  BP: 110/70  Pulse: 62  Temp: 98.1 F (36.7 C)  TempSrc: Temporal  SpO2: 95%  Weight: 228 lb 6.1 oz (103.6 kg)  Height: 5\' 6"  (1.676 m)     Body mass index is 36.86 kg/m.  Physical Exam:   Physical Exam Vitals and nursing note reviewed.  Constitutional:      General: She is not in acute distress.    Appearance: She is well-developed. She is not ill-appearing or toxic-appearing.  Cardiovascular:     Rate and Rhythm: Normal rate and regular rhythm.     Pulses: Normal pulses.     Heart sounds: Normal heart sounds, S1 normal and S2 normal.  Pulmonary:     Effort: Pulmonary effort is normal.     Breath sounds: Normal breath sounds.  Skin:    General: Skin is warm and dry.  Neurological:     Mental Status: She is alert.     GCS: GCS eye subscore is 4. GCS verbal subscore is 5. GCS motor subscore is 6.  Psychiatric:        Speech: Speech normal.        Behavior: Behavior normal. Behavior is cooperative.     Assessment and Plan:   1. Subacute cough (Primary) No red flags on exam.   Suspect post-viral cough Will initiate albuterol as needed and tessalon perles per orders.  Discussed taking medications as prescribed.  Reviewed return precautions including new or worsening fever, SOB, new or worsening  cough or other concerns.  Push fluids and rest.  I recommend that patient follow-up if symptoms worsen or persist despite treatment x 7-10 days, sooner if needed.   I, Isabelle Course, acting as a Neurosurgeon for Jarold Motto, [place]., have documented all relevant documentation on the behalf of Jarold Motto, [place], as directed by  Jarold Motto, PA while in the presence of Jarold Motto, [place].  I, Jarold Motto, [place], have reviewed all documentation for this visit. The documentation on 02/10/24 for the exam, diagnosis, procedures, and orders are all accurate and complete.  Jarold Motto, PA-C

## 2024-02-10 NOTE — Telephone Encounter (Signed)
 Allergy is intolerance.  Patient has problems with high doses of gabapentin.

## 2024-02-23 ENCOUNTER — Ambulatory Visit: Payer: Medicare Other

## 2024-02-27 ENCOUNTER — Other Ambulatory Visit: Payer: Self-pay | Admitting: Physician Assistant

## 2024-02-28 ENCOUNTER — Encounter: Payer: Self-pay | Admitting: Physician Assistant

## 2024-03-01 NOTE — Telephone Encounter (Signed)
 Noted.

## 2024-03-06 ENCOUNTER — Other Ambulatory Visit: Payer: Self-pay | Admitting: Family Medicine

## 2024-03-08 NOTE — Telephone Encounter (Signed)
 Rx refill request approved per Dr. Zollie Pee orders.

## 2024-03-10 ENCOUNTER — Other Ambulatory Visit: Payer: Self-pay | Admitting: *Deleted

## 2024-03-10 MED ORDER — METFORMIN HCL ER 500 MG PO TB24: 1000.0000 mg | ORAL_TABLET | Freq: Every day | ORAL | 1 refills | Status: DC

## 2024-03-15 ENCOUNTER — Telehealth (HOSPITAL_BASED_OUTPATIENT_CLINIC_OR_DEPARTMENT_OTHER): Payer: Self-pay | Admitting: Student

## 2024-03-15 ENCOUNTER — Encounter (HOSPITAL_BASED_OUTPATIENT_CLINIC_OR_DEPARTMENT_OTHER): Payer: Self-pay

## 2024-03-15 DIAGNOSIS — M5412 Radiculopathy, cervical region: Secondary | ICD-10-CM

## 2024-03-15 NOTE — Telephone Encounter (Signed)
 Patient states that she needs a new referral for PT because her insurance started over and her deductible started over

## 2024-03-15 NOTE — Telephone Encounter (Signed)
New PT order placed in chart

## 2024-03-15 NOTE — Telephone Encounter (Signed)
 Called and advised pt.

## 2024-03-16 ENCOUNTER — Ambulatory Visit: Admitting: Physician Assistant

## 2024-04-01 ENCOUNTER — Other Ambulatory Visit (INDEPENDENT_AMBULATORY_CARE_PROVIDER_SITE_OTHER)

## 2024-04-01 ENCOUNTER — Encounter: Payer: Self-pay | Admitting: Nurse Practitioner

## 2024-04-01 ENCOUNTER — Ambulatory Visit (INDEPENDENT_AMBULATORY_CARE_PROVIDER_SITE_OTHER): Admitting: Nurse Practitioner

## 2024-04-01 VITALS — BP 124/72 | HR 65 | Ht 66.0 in | Wt 227.0 lb

## 2024-04-01 DIAGNOSIS — R1310 Dysphagia, unspecified: Secondary | ICD-10-CM

## 2024-04-01 DIAGNOSIS — D649 Anemia, unspecified: Secondary | ICD-10-CM

## 2024-04-01 DIAGNOSIS — Z791 Long term (current) use of non-steroidal anti-inflammatories (NSAID): Secondary | ICD-10-CM

## 2024-04-01 DIAGNOSIS — I2510 Atherosclerotic heart disease of native coronary artery without angina pectoris: Secondary | ICD-10-CM

## 2024-04-01 DIAGNOSIS — I25810 Atherosclerosis of coronary artery bypass graft(s) without angina pectoris: Secondary | ICD-10-CM

## 2024-04-01 DIAGNOSIS — K219 Gastro-esophageal reflux disease without esophagitis: Secondary | ICD-10-CM

## 2024-04-01 DIAGNOSIS — R194 Change in bowel habit: Secondary | ICD-10-CM

## 2024-04-01 LAB — IBC + FERRITIN
Ferritin: 33.2 ng/mL (ref 10.0–291.0)
Iron: 47 ug/dL (ref 42–145)
Saturation Ratios: 14.3 % — ABNORMAL LOW (ref 20.0–50.0)
TIBC: 327.6 ug/dL (ref 250.0–450.0)
Transferrin: 234 mg/dL (ref 212.0–360.0)

## 2024-04-01 LAB — VITAMIN B12: Vitamin B-12: 644 pg/mL (ref 211–911)

## 2024-04-01 LAB — CBC
HCT: 38.8 % (ref 36.0–46.0)
Hemoglobin: 12.9 g/dL (ref 12.0–15.0)
MCHC: 33.3 g/dL (ref 30.0–36.0)
MCV: 85.4 fl (ref 78.0–100.0)
Platelets: 327 10*3/uL (ref 150.0–400.0)
RBC: 4.55 Mil/uL (ref 3.87–5.11)
RDW: 14.8 % (ref 11.5–15.5)
WBC: 9.6 10*3/uL (ref 4.0–10.5)

## 2024-04-01 LAB — FOLATE: Folate: 15.4 ng/mL (ref >5.9)

## 2024-04-01 NOTE — Patient Instructions (Addendum)
 Your provider has requested that you go to the basement level for lab work before leaving today. Press "B" on the elevator. The lab is located at the first door on the left as you exit the elevator.   You have been scheduled for a Barium Esophogram at [facility] (1st floor of the hospital) on 04/19/24 at 11am. Please arrive 30 minutes prior to your appointment for registration. Make certain not to have anything to eat or drink 3 hours prior to your test. If you need to reschedule for any reason, please contact radiology at [phone] to do so. __________________________________________________________________ A barium swallow is an examination that concentrates on views of the esophagus. This tends to be a double contrast exam (barium and two liquids which, when combined, create a gas to distend the wall of the oesophagus) or single contrast (non-ionic iodine based). The study is usually tailored to your symptoms so a good history is essential. Attention is paid during the study to the form, structure and configuration of the esophagus, looking for functional disorders (such as aspiration, dysphagia, achalasia, motility and reflux) EXAMINATION You may be asked to change into a gown, depending on the type of swallow being performed. A radiologist and radiographer will perform the procedure. The radiologist will advise you of the type of contrast selected for your procedure and direct you during the exam. You will be asked to stand, sit or lie in several different positions and to hold a small amount of fluid in your mouth before being asked to swallow while the imaging is performed .In some instances you may be asked to swallow barium coated marshmallows to assess the motility of a solid food bolus. The exam can be recorded as a digital or video fluoroscopy procedure. POST PROCEDURE It will take 1-2 days for the barium to pass through your system. To facilitate this, it is important, unless  otherwise directed, to increase your fluids for the next 24-48hrs and to resume your normal diet.  This test typically takes about 30 minutes to perform. __________________________________________________________________________________    Elene Griffes have been scheduled for an endoscopy. Please follow written instructions given to you at your visit today.  If you use inhalers (even only as needed), please bring them with you on the day of your procedure.  If you take any of the following medications, they will need to be adjusted prior to your procedure:   DO NOT TAKE 7 DAYS PRIOR TO TEST- Trulicity (dulaglutide) Ozempic, Wegovy (semaglutide) Mounjaro (tirzepatide) Bydureon Bcise (exanatide extended release)  DO NOT TAKE 1 DAY PRIOR TO YOUR TEST Rybelsus (semaglutide) Adlyxin (lixisenatide) Victoza  (liraglutide ) Byetta (exanatide) ___________________________________________________________________________   _______________________________________________________  If your blood pressure at your visit was 140/90 or greater, please contact your primary care physician to follow up on this.  _______________________________________________________  If you are age 58 or older, your body mass index should be between 23-30. Your Body mass index is 36.64 kg/m. If this is out of the aforementioned range listed, please consider follow up with your Primary Care Provider.  If you are age 37 or younger, your body mass index should be between 19-25. Your Body mass index is 36.64 kg/m. If this is out of the aformentioned range listed, please consider follow up with your Primary Care Provider.   ________________________________________________________  The [facility] providers would like to encourage you to use MYCHART to communicate with providers for non-urgent requests or questions.  Due to long hold times on the telephone, sending your provider  a message by [facility] may be a faster and more efficient  way to get a response.  Please allow 48 business hours for a response.  Please remember that this is for non-urgent requests.  _______________________________________________________   Thank you for trusting me with your gastrointestinal care!   Everett Hitt, CRNP

## 2024-04-01 NOTE — Progress Notes (Signed)
 04/01/2024 Crystal Bean [id number] [birth year]   CHIEF COMPLAINT: Difficulty swallowing  HISTORY OF PRESENT ILLNESS: Crystal Bean is a 48 year old female with a past medical history of anxiety, depression, bipolar disorder, ADHD, hypertension, coronary artery disease s/p NSTEMI AND DES to the proximal LAD 12/2021, obesity, prediabetes, sleep apnea, migraine headaches, B12 deficiency, constipation and GERD.  She presents to our office today as referred by Twyla Galeazzi, PA-C for further evaluation regarding GERD and dysphagia.  She endorses having dysphagia symptoms for the past 5 years, describes food sticks and passes slowly all the way down the esophagus into the stomach and for the past few months she is having difficulty swallowing pills and liquids as well. She also has episodes when food such as cake gets stuck to the upper esophagus, she drinks water and the food passes and does not come back up.  She was taking Pantoprazole  40 mg daily for 3 to 4 months which resulted in urgent diarrhea therefore she switched to OTC Esomeprazole 20 mg daily 2 weeks ago and her diarrhea abated. She developed acid reflux symptoms when she was on Protonix  which resolved on Esomeprazole. No upper abdominal pain.She denies ever having an EGD. She noted taking Ozempic for few months which was discontinued due to abdominal pain 12/2023. She typically passes a soft to loose stool once daily and has not had a solid firm stool since 11/2023.  She denies ever having a screening colonoscopy.  She completed a Cologuard test 11/25/2023 which was negative.  No known family history of colon polyps or colorectal cancer.  She previously took diclofenac  for 3 years for shoulder and back pain then switched to Celebrex  200 mg once daily a few months ago.  On ASA 81 mg daily.  She has a history of coronary artery disease status post NSTEMI with DES placement 12/2021 on DAPT x 1 year. She was last seen in office by her cardiologist  Dr. Audery Blazing 06/24/2023, her cardiac status was stable at that time and she was instructed to continue ASA, statin and antihypertensive medications.  She denies having any angina.     Latest Ref Rng & Units 05/16/2023    1:49 PM 04/21/2023   11:34 AM 04/10/2023   10:14 AM  CBC  WBC 4.0 - 10.5 K/uL 11.2  8.7  17.9   Hemoglobin 12.0 - 15.0 g/dL 40.9  81.1  91.4   Hematocrit 36.0 - 46.0 % 37.0  35.9  34.0   Platelets 150.0 - 400.0 K/uL 371.0  562.0  410.0        Latest Ref Rng & Units 04/21/2023   11:34 AM 04/10/2023   10:14 AM 12/11/2022   11:29 AM  CMP  Glucose 70 - 99 mg/dL 77  89  87   BUN 6 - 23 mg/dL 9  9  8    Creatinine 0.40 - 1.20 mg/dL 7.82  9.56  2.13   Sodium 135 - 145 mEq/L 138  133  138   Potassium 3.5 - 5.1 mEq/L 4.2  3.5  4.3   Chloride 96 - 112 mEq/L 102  95  102   CO2 19 - 32 mEq/L 31  29  29    Calcium  8.4 - 10.5 mg/dL 8.9  8.9  9.3   Total Protein 6.0 - 8.3 g/dL 6.6  6.6  7.1   Total Bilirubin 0.2 - 1.2 mg/dL 0.2  0.4  0.3   Alkaline Phos 39 - 117 U/L 73  85  68  AST 0 - 37 U/L 20  13  13    ALT 0 - 35 U/L 26  17  15       CARDIAC CATHETERIZATION 12/28/2021:     Prox LAD lesion is 80% stenosed.   A drug-eluting stent was successfully placed using a STENT ONYX FRONTIER 4.0X15.   Post intervention, there is a 0% residual stenosis.   The left ventricular systolic function is normal.   LV end diastolic pressure is normal.   The left ventricular ejection fraction is 55-65% by visual estimate.   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   Successful PCI of the proximal LAD with IVUS guidance and DES x 1.    ECHO 12/28/2021: IMPRESSIONS Left ventricular ejection fraction, by estimation, is 50 to 55%. The left ventricle has low normal function. The left ventricle demonstrates regional wall motion abnormalities (see scoring diagram/findings for description). There is mild concentric left ventricular hypertrophy. Left  ventricular diastolic parameters are consistent with Grade II diastolic dysfunction (pseudonormalization). Elevated left atrial pressure. 1. Right ventricular systolic function is normal. The right ventricular size is normal. Tricuspid regurgitation signal is inadequate for assessing PA pressure. 2. 3. Left atrial size was mildly dilated. 4. The mitral valve is normal in structure. Mild mitral valve regurgitation. The aortic valve is tricuspid. Aortic valve regurgitation is not visualized. No aortic stenosis is present. 5. The inferior vena cava is normal in size with greater than 50% respiratory variability, suggesting right atrial pressure of 3 mmHg.  Past Medical History:  Diagnosis Date   ADHD (attention deficit hyperactivity disorder)    Anxiety    B12 deficiency    Back pain    Bipolar 1 disorder (HCC)    Bulging lumbar disc    C. difficile diarrhea 04/24/2016   treated with metronidazole   Constipation    Depression    GERD (gastroesophageal reflux disease)    History of chicken pox    History of shingles 2013   Hypertension    IBS (irritable bowel syndrome)    Joint pain    Major depressive disorder    Migraines    Obesity    Obsessive-compulsive disorder    Osteoarthritis    Prediabetes    PTSD (post-traumatic stress disorder)    Sleep apnea    SOB (shortness of breath)    Past Surgical History:  Procedure Laterality Date   BLADDER SURGERY  1981   Urethera stretched   caps on teeth  1980   Caps on all Teeth   CESAREAN SECTION  2003   CORONARY STENT INTERVENTION N/A 12/28/2021   Procedure: CORONARY STENT INTERVENTION;  Surgeon: Swaziland, Peter M, MD;  Location: [facility] INVASIVE CV LAB;  Service: Cardiovascular;  Laterality: N/A;   LEFT HEART CATH AND CORONARY ANGIOGRAPHY N/A 12/28/2021   Procedure: LEFT HEART CATH AND CORONARY ANGIOGRAPHY;  Surgeon: Swaziland, Peter M, MD;  Location: [facility] INVASIVE CV LAB;  Service: Cardiovascular;  Laterality: N/A;   TONSILLECTOMY     WISDOM TOOTH  EXTRACTION     Social History: She is married.  She has 1 daughter.  She reports that she quit smoking about 12 years ago. Her smoking use included cigarettes. She started smoking about 32 years ago. She has a 20 pack-year smoking history. She has never used smokeless tobacco. She reports that she does not currently use alcohol. She reports that she does not use drugs.  Family History: family history includes Arthritis in her  paternal grandmother; CVA in her maternal grandmother; Dementia in her paternal grandfather and paternal grandmother; Depression in her mother; Eczema in her daughter; Healthy in her sister; Hearing loss in her father and paternal grandmother; Heart disease in her maternal grandmother; Obesity in her father and mother; Osteoporosis in her mother.  No known family history of colon polyps or colorectal cancer.  Allergies  Allergen Reactions   Gabapentin  Other (See Comments)    Pt can not tolerated large dose give her mood swings   Prednisone  Other (See Comments)    Severe mood swings   Meloxicam Rash   Penicillins Rash      Outpatient Encounter Medications as of 04/01/2024  Medication Sig   albuterol  (VENTOLIN  HFA) 108 (90 Base) MCG/ACT inhaler Inhale 2 puffs into the lungs every 6 (six) hours as needed for wheezing or shortness of breath.   amphetamine -dextroamphetamine  (ADDERALL XR) 10 MG 24 hr capsule Take 1 capsule (10 mg total) by mouth 2 (two) times daily with breakfast and lunch.   amphetamine -dextroamphetamine  (ADDERALL XR) 10 MG 24 hr capsule Take 1 capsule (10 mg total) by mouth 2 (two) times daily with breakfast and lunch.   amphetamine -dextroamphetamine  (ADDERALL XR) 10 MG 24 hr capsule Take 1 capsule (10 mg total) by mouth 2 (two) times daily with breakfast and lunch.   ARIPiprazole  (ABILIFY ) 10 MG tablet Take 1 tablet (10 mg total) by mouth daily.   aspirin  EC 81 MG EC tablet Take 1 tablet (81 mg total) by mouth daily. Swallow whole.   benzonatate  (TESSALON   PERLES) 100 MG capsule Take 1 capsule (100 mg total) by mouth 3 (three) times daily as needed.   buPROPion  (WELLBUTRIN  XL) 300 MG 24 hr tablet Take 1 tablet (300 mg total) by mouth daily.   celecoxib  (CELEBREX ) 200 MG capsule Take 1 capsule by mouth twice daily   Cholecalciferol (VITAMIN D3) 10 MCG (400 UNIT) CAPS Take by mouth.   gabapentin  (NEURONTIN ) 300 MG capsule TAKE 1 CAPSULE BY MOUTH THREE TIMES DAILY AS NEEDED FOR  NERVE  PAIN   ketoconazole  (NIZORAL ) 2 % cream APPLY TO AFFECTED AREA(S) ONCE TO TWICE DAILY   lisinopril  (ZESTRIL ) 2.5 MG tablet Take 1 tablet by mouth once daily   LORazepam  (ATIVAN ) 1 MG tablet Take 0.5-1 tablets (0.5-1 mg total) by mouth every 8 (eight) hours as needed for anxiety.   metFORMIN  (GLUCOPHAGE -XR) 500 MG 24 hr tablet Take 2 tablets (1,000 mg total) by mouth daily with breakfast.   metoprolol  succinate (TOPROL -XL) 25 MG 24 hr tablet Take 1 tablet by mouth once daily   Multiple Vitamin (MULTIVITAMIN) tablet Take 1 tablet by mouth daily.   nitroGLYCERIN  (NITROSTAT ) 0.4 MG SL tablet Place 1 tablet (0.4 mg total) under the tongue every 5 (five) minutes x 3 doses as needed for chest pain.   Omega-3 Fatty Acids (FISH OIL PO) Take by mouth.   pantoprazole  (PROTONIX ) 40 MG tablet Take 1 tablet (40 mg total) by mouth daily.   rosuvastatin  (CRESTOR ) 40 MG tablet Take 1 tablet by mouth once daily   triamcinolone  ointment (KENALOG ) 0.5 % Apply to affected area 1-2 times daily   venlafaxine  XR (EFFEXOR -XR) 150 MG 24 hr capsule Take 2 capsules (300 mg total) by mouth daily.   No facility-administered encounter medications on file as of 04/01/2024.    REVIEW OF SYSTEMS:  Gen: + Fatigue. Denies fever, sweats or chills. No unintentional weight loss.  CV: Denies chest pain, palpitations or edema. Resp: + Shortness of breath, not  currently.  GI: See HPI. GU: + Urine leakage.  MS: + Arthritis and back pain. Derm: Denies rash, itchiness, skin lesions or unhealing  ulcers. Psych:+ Anxiety and depression. Heme: Denies bruising, easy bleeding. Neuro:  Denies headaches, dizziness or paresthesias. Endo:  Denies any problems with DM, thyroid  or adrenal function.  PHYSICAL EXAM: BP 124/72   Pulse 65   Ht 5\' 6"  (1.676 m)   Wt 227 lb (103 kg)   BMI 36.64 kg/m   Wt Readings from Last 3 Encounters:  04/01/24 227 lb (103 kg)  02/10/24 228 lb 6.1 oz (103.6 kg)  12/24/23 231 lb 6.1 oz (105 kg)    General: 48 year old female in no acute distress. Head: Normocephalic and atraumatic. Eyes:  Sclerae non-icteric, conjunctive pink. Ears: Normal auditory acuity. Mouth: Dentition intact. No ulcers or lesions.  Neck: Supple, no lymphadenopathy or thyromegaly.  Lungs: Clear bilaterally to auscultation without wheezes, crackles or rhonchi. Heart: Regular rate and rhythm. No murmur, rub or gallop appreciated.  Abdomen: Soft, nontender, nondistended. No masses. No hepatosplenomegaly. Normoactive bowel sounds x 4 quadrants.  Rectal: Deferred.  Musculoskeletal: Symmetrical with no gross deformities. Skin: Warm and dry. No rash or lesions on visible extremities. Extremities: No edema. Neurological: Alert oriented x 4, no focal deficits.  Psychological:  Alert and cooperative. Normal mood and affect.  ASSESSMENT AND PLAN:  48 year old female with GERD and chronic dysphagia with solid food x 5 years which has progressively worsened over the past few months, patient now having difficulty swallowing liquids and pills. Chronic NSAID use.  -Barium swallow study with tablet scheduled 04/19/2024 -EGD with possible esophageal dilatation scheduled 05/14/2024 -Continue Esomeprazole 20 mg daily -Patient instructed to avoid eating large pieces of bread, meat or rice  Colon cancer screening.  Negative Cologuard test 11/25/2023.  Never had a screening colonoscopy.  No known family history of colon polyps or colorectal cancer. - Recommended a conventional colonoscopy versus repeat  Cologuard test 11/2026  Altered bowel pattern, soft to loose stools, last solid stool was 11/2023. - Avoid dairy products - Benefiber 1 tablespoon daily as tolerated - Consider a diagnostic colonoscopy to rule out microscopic colitis if loose stools persist  Coronary artery disease status post NSTEMI with DES placement 12/2021, previously on DAPT x 1 year then only ASA 81mg  every day.  LVEF 50 to 55% per echo 12/2021.  Mild normocytic anemia.  No overt GI bleeding. -CBC, IBC + Ferritin, B12 and folate level  CC:  Alexander Iba, [place]

## 2024-04-03 ENCOUNTER — Other Ambulatory Visit: Payer: Self-pay | Admitting: Family Medicine

## 2024-04-05 ENCOUNTER — Telehealth: Payer: Self-pay | Admitting: Physician Assistant

## 2024-04-05 ENCOUNTER — Other Ambulatory Visit: Payer: Self-pay

## 2024-04-05 ENCOUNTER — Ambulatory Visit: Payer: Self-pay | Admitting: Nurse Practitioner

## 2024-04-05 NOTE — Telephone Encounter (Signed)
 Pended Adderall to [facility] [address] [facility]

## 2024-04-05 NOTE — Telephone Encounter (Signed)
 Crystal Bean called at 4:32 to request refill of her Adderall.  Appt 6/5.  Send to  [id number] - [place], [place] - [address] AT NEXT TO [facility]

## 2024-04-05 NOTE — Telephone Encounter (Signed)
 Last OV 06/24/23 Next OV not scheduled  Last refill 03/08/24 Qty #90/0

## 2024-04-06 MED ORDER — AMPHETAMINE-DEXTROAMPHET ER 10 MG PO CP24: 10.0000 mg | ORAL_CAPSULE | Freq: Two times a day (BID) | ORAL | 0 refills | Status: DC

## 2024-04-06 NOTE — Progress Notes (Signed)
 Noted

## 2024-04-09 ENCOUNTER — Other Ambulatory Visit: Payer: Self-pay | Admitting: Cardiology

## 2024-04-10 ENCOUNTER — Other Ambulatory Visit: Payer: Self-pay | Admitting: Physician Assistant

## 2024-04-19 ENCOUNTER — Ambulatory Visit

## 2024-04-19 ENCOUNTER — Ambulatory Visit (HOSPITAL_COMMUNITY)
Admission: RE | Admit: 2024-04-19 | Discharge: 2024-04-19 | Disposition: A | Discharge status: Home / Self Care | Source: Ambulatory Visit | Attending: Nurse Practitioner | Admitting: Nurse Practitioner

## 2024-04-19 DIAGNOSIS — R1310 Dysphagia, unspecified: Secondary | ICD-10-CM | POA: Insufficient documentation

## 2024-04-21 ENCOUNTER — Other Ambulatory Visit: Payer: Self-pay

## 2024-04-21 MED ORDER — ESOMEPRAZOLE MAGNESIUM 20 MG PO CPDR: 20.0000 mg | DELAYED_RELEASE_CAPSULE | Freq: Every day | ORAL | 3 refills | Status: DC

## 2024-04-22 ENCOUNTER — Encounter: Payer: Self-pay | Admitting: Physician Assistant

## 2024-04-22 ENCOUNTER — Telehealth: Payer: Medicare Other | Admitting: Physician Assistant

## 2024-04-22 DIAGNOSIS — F411 Generalized anxiety disorder: Secondary | ICD-10-CM

## 2024-04-22 DIAGNOSIS — F902 Attention-deficit hyperactivity disorder, combined type: Secondary | ICD-10-CM

## 2024-04-22 DIAGNOSIS — F422 Mixed obsessional thoughts and acts: Secondary | ICD-10-CM

## 2024-04-22 DIAGNOSIS — F319 Bipolar disorder, unspecified: Secondary | ICD-10-CM | POA: Diagnosis not present

## 2024-04-22 MED ORDER — BUPROPION HCL ER (XL) 300 MG PO TB24: 300.0000 mg | ORAL_TABLET | Freq: Every day | ORAL | 1 refills | Status: DC

## 2024-04-22 MED ORDER — VENLAFAXINE HCL ER 150 MG PO CP24: 300.0000 mg | ORAL_CAPSULE | Freq: Every day | ORAL | 1 refills | Status: DC

## 2024-04-22 MED ORDER — AMPHETAMINE-DEXTROAMPHET ER 10 MG PO CP24: 10.0000 mg | ORAL_CAPSULE | Freq: Two times a day (BID) | ORAL | 0 refills | Status: DC

## 2024-04-22 MED ORDER — ARIPIPRAZOLE 10 MG PO TABS: 10.0000 mg | ORAL_TABLET | Freq: Every day | ORAL | 1 refills | Status: DC

## 2024-04-22 NOTE — Progress Notes (Signed)
 [facility]  Patient ID: Crystal Bean,  MRN: [id number]  PCP: Alexander Iba, PA  Date of Evaluation: 04/22/2024 Time spent:20 minutes  Chief Complaint:  Chief Complaint   Anxiety; Depression; ADHD; Follow-up   Virtual Visit via Telehealth  I connected with patient by a video enabled telemedicine application, with their informed consent, and verified patient privacy and that I am speaking with the correct person using two identifiers.  I am private, in my office and the patient is at home.  I discussed the limitations, risks, security and privacy concerns of performing an evaluation and management service by video and the availability of in person appointments. I also discussed with the patient that there may be a patient responsible charge related to this service. The patient expressed understanding and agreed to proceed.   I discussed the assessment and treatment plan with the patient. The patient was provided an opportunity to ask questions and all were answered. The patient agreed with the plan and demonstrated an understanding of the instructions.   The patient was advised to call back or seek an in-person evaluation if the symptoms worsen or if the condition fails to improve as anticipated.  I provided 20 minutes of non-face-to-face time during this encounter.  HISTORY/CURRENT STATUS: HPI For routine med check.  Doing well as far as her mental health goes.  She feels that her medications are still working well.  When she took the Ativan  on occasion it made her feel like she had an "emotional hangover" the next day.  She did not feel it was worth the help it gave her for anxiety.  She is not having OCD symptoms except washes her hands a lot when she is baking but it is about the same and not affecting her day-to-day life.  Patient is able to enjoy things.  Energy and motivation are good.  She does not work outside the home.  She is on disability for physical and  mental health reasons.  No extreme sadness, tearfulness, or feelings of hopelessness.  Sleeps well most of the time. ADLs and personal hygiene are normal.   No change in memory.  Appetite has not changed.  Weight is stable.   Denies suicidal or homicidal thoughts.  States that attention is good without easy distractibility.  Able to focus on things and finish tasks to completion.   Patient denies increased energy with decreased need for sleep, increased talkativeness, racing thoughts, impulsivity or risky behaviors, increased spending, increased libido, grandiosity, increased irritability or anger, paranoia, or hallucinations.  Denies dizziness, syncope, seizures, numbness, tingling, tremor, tics, unsteady gait, slurred speech, confusion. Denies muscle or joint pain, stiffness, or dystonia. Denies unexplained weight loss, frequent infections, or sores that heal slowly.  No polyphagia, polydipsia, or polyuria. Denies visual changes or paresthesias.   Individual Medical History/ Review of Systems: Changes? :No     Past medications for mental health diagnoses include: Prozac, Celexa, Lexapro, Wellbutrin , Effexor  XR, Pristiq, Abilify ,  Adderall, Ritalin, Klonopin, Xanax , Gabapentin , Buspar  Allergies: Gabapentin , Prednisone , Meloxicam, and Penicillins  Current Medications:  Current Outpatient Medications:    acetaminophen  (TYLENOL ) 650 MG CR tablet, Take 650 mg by mouth every 8 (eight) hours as needed for pain., Disp: , Rfl:    albuterol  (VENTOLIN  HFA) 108 (90 Base) MCG/ACT inhaler, Inhale 2 puffs into the lungs every 6 (six) hours as needed for wheezing or shortness of breath., Disp: 8 g, Rfl: 0   aspirin  EC 81 MG EC tablet, Take 1 tablet (  81 mg total) by mouth daily. Swallow whole., Disp: 30 tablet, Rfl: 11   celecoxib  (CELEBREX ) 200 MG capsule, Take 1 capsule by mouth twice daily, Disp: 60 capsule, Rfl: 0   Cholecalciferol (VITAMIN D3) 10 MCG (400 UNIT) CAPS, Take by mouth., Disp: , Rfl:     esomeprazole (NEXIUM) 20 MG capsule, Take 1 capsule (20 mg total) by mouth daily before breakfast., Disp: 90 capsule, Rfl: 3   gabapentin  (NEURONTIN ) 300 MG capsule, TAKE 1 CAPSULE BY MOUTH THREE TIMES DAILY AS NEEDED FOR  NERVE  PAIN, Disp: 90 capsule, Rfl: 1   lisinopril  (ZESTRIL ) 2.5 MG tablet, Take 1 tablet by mouth once daily, Disp: 90 tablet, Rfl: 3   metFORMIN  (GLUCOPHAGE -XR) 500 MG 24 hr tablet, Take 2 tablets (1,000 mg total) by mouth daily with breakfast., Disp: 180 tablet, Rfl: 1   metoprolol  succinate (TOPROL -XL) 25 MG 24 hr tablet, Take 1 tablet by mouth once daily, Disp: 90 tablet, Rfl: 0   Multiple Vitamin (MULTIVITAMIN) tablet, Take 1 tablet by mouth daily., Disp: , Rfl:    Omega-3 Fatty Acids (FISH OIL PO), Take by mouth., Disp: , Rfl:    rosuvastatin  (CRESTOR ) 40 MG tablet, Take 1 tablet by mouth once daily, Disp: 90 tablet, Rfl: 3   [START ON 05/06/2024] amphetamine -dextroamphetamine  (ADDERALL XR) 10 MG 24 hr capsule, Take 1 capsule (10 mg total) by mouth 2 (two) times daily with breakfast and lunch., Disp: 60 capsule, Rfl: 0   [START ON 06/04/2024] amphetamine -dextroamphetamine  (ADDERALL XR) 10 MG 24 hr capsule, Take 1 capsule (10 mg total) by mouth 2 (two) times daily with breakfast and lunch., Disp: 60 capsule, Rfl: 0   [START ON 07/04/2024] amphetamine -dextroamphetamine  (ADDERALL XR) 10 MG 24 hr capsule, Take 1 capsule (10 mg total) by mouth 2 (two) times daily with breakfast and lunch., Disp: 60 capsule, Rfl: 0   ARIPiprazole  (ABILIFY ) 10 MG tablet, Take 1 tablet (10 mg total) by mouth daily., Disp: 90 tablet, Rfl: 1   benzonatate  (TESSALON  PERLES) 100 MG capsule, Take 1 capsule (100 mg total) by mouth 3 (three) times daily as needed. (Patient not taking: Reported on 04/22/2024), Disp: 30 capsule, Rfl: 1   buPROPion  (WELLBUTRIN  XL) 300 MG 24 hr tablet, Take 1 tablet (300 mg total) by mouth daily., Disp: 90 tablet, Rfl: 1   ketoconazole  (NIZORAL ) 2 % cream, APPLY TO AFFECTED AREA(S)  ONCE TO TWICE DAILY (Patient not taking: Reported on 04/22/2024), Disp: 30 g, Rfl: 0   nitroGLYCERIN  (NITROSTAT ) 0.4 MG SL tablet, Place 1 tablet (0.4 mg total) under the tongue every 5 (five) minutes x 3 doses as needed for chest pain. (Patient not taking: Reported on 04/22/2024), Disp: 25 tablet, Rfl: 12   triamcinolone  ointment (KENALOG ) 0.5 %, Apply to affected area 1-2 times daily (Patient not taking: Reported on 04/22/2024), Disp: 15 g, Rfl: 0   venlafaxine  XR (EFFEXOR -XR) 150 MG 24 hr capsule, Take 2 capsules (300 mg total) by mouth daily., Disp: 180 capsule, Rfl: 1 Medication Side Effects: none  Family Medical/ Social History: Changes? no  MENTAL HEALTH EXAM:  There were no vitals taken for this visit.There is no height or weight on file to calculate BMI.  General Appearance: Casual, Well Groomed and Obese  Eye Contact:  Good  Speech:  Clear and Coherent and Normal Rate  Volume:  Normal  Mood:  Euthymic  Affect:  Congruent  Thought Process:  Goal Directed and Descriptions of Associations: Circumstantial  Orientation:  Full (Time, Place, and Person)  Thought Content: Logical   Suicidal Thoughts:  No  Homicidal Thoughts:  No  Memory:  WNL  Judgement:  Good  Insight:  Good  Psychomotor Activity:  Normal  Concentration:  Concentration: Good and Attention Span: Good  Recall:  Good  Fund of Knowledge: Good  Language: Good  Assets:  Communication Skills Desire for Improvement Financial Resources/Insurance Housing Resilience Social Support Transportation  ADL's:  Intact  Cognition: WNL  Prognosis:  Good   DIAGNOSES:    ICD-10-CM   1. Bipolar I disorder (HCC)  F31.9     2. Attention deficit hyperactivity disorder (ADHD), combined type  F90.2     3. Generalized anxiety disorder  F41.1     4. Mixed obsessional thoughts and acts  F42.2      Receiving Psychotherapy: Yes  Bunnie Carol   RECOMMENDATIONS:  PDMP was reviewed.  Adderall filled 04/06/2024.  Gabapentin  filled  04/06/2024.  Ativan  filled 02/08/2024.   I provided 20 minutes of non-face-to-face time during this encounter, including time spent before and after the visit in records review, medical decision making, counseling pertinent to today's visit, and charting.   She's doing well so no changes are needed.   Continue Adderall XR 10 mg, 1 p.o. at breakfast and 1 p.o. at lunch. Continue Abilify  10 mg, 1 p.o. every morning. Continue Wellbutrin  XL 300 mg, 1 p.o. daily. Continue gabapentin  100 mg, 1-3 daily as directed. Continue Effexor  XR 150 mg, 2 p.o. every morning. Continue therapy. Return in 4 months.   Marvia Slocumb, PA-C

## 2024-05-09 ENCOUNTER — Other Ambulatory Visit: Payer: Self-pay | Admitting: Physician Assistant

## 2024-05-14 ENCOUNTER — Encounter: Payer: Self-pay | Admitting: Internal Medicine

## 2024-05-14 ENCOUNTER — Ambulatory Visit (AMBULATORY_SURGERY_CENTER): Admitting: Internal Medicine

## 2024-05-14 VITALS — BP 99/50 | HR 63 | Temp 98.0°F | Resp 20 | Ht 66.0 in | Wt 230.0 lb

## 2024-05-14 DIAGNOSIS — K219 Gastro-esophageal reflux disease without esophagitis: Secondary | ICD-10-CM | POA: Diagnosis not present

## 2024-05-14 DIAGNOSIS — R1310 Dysphagia, unspecified: Secondary | ICD-10-CM

## 2024-05-14 MED ORDER — SODIUM CHLORIDE 0.9 % IV SOLN: 500.0000 mL | Freq: Once | INTRAVENOUS | Status: AC

## 2024-05-14 NOTE — Progress Notes (Signed)
 Called to room to assist during endoscopic procedure.  Patient ID and intended procedure confirmed with present staff. Received instructions for my participation in the procedure from the performing physician.

## 2024-05-14 NOTE — Progress Notes (Signed)
 Expand All Collapse All        04/01/2024 Crystal Bean [id number] [birth year]     CHIEF COMPLAINT: Difficulty swallowing   HISTORY OF PRESENT ILLNESS: Crystal Bean is a 48 year old female with a past medical history of anxiety, depression, bipolar disorder, ADHD, hypertension, coronary artery disease s/p NSTEMI AND DES to the proximal LAD 12/2021, obesity, prediabetes, sleep apnea, migraine headaches, B12 deficiency, constipation and GERD.  She presents to our office today as referred by Lucie Leyland, PA-C for further evaluation regarding GERD and dysphagia.   She endorses having dysphagia symptoms for the past 5 years, describes food sticks and passes slowly all the way down the esophagus into the stomach and for the past few months she is having difficulty swallowing pills and liquids as well. She also has episodes when food such as cake gets stuck to the upper esophagus, she drinks water and the food passes and does not come back up.  She was taking Pantoprazole  40 mg daily for 3 to 4 months which resulted in urgent diarrhea therefore she switched to OTC Esomeprazole  20 mg daily 2 weeks ago and her diarrhea abated. She developed acid reflux symptoms when she was on Protonix  which resolved on Esomeprazole . No upper abdominal pain.She denies ever having an EGD. She noted taking Ozempic for few months which was discontinued due to abdominal pain 12/2023. She typically passes a soft to loose stool once daily and has not had a solid firm stool since 11/2023.  She denies ever having a screening colonoscopy.  She completed a Cologuard test 11/25/2023 which was negative.  No known family history of colon polyps or colorectal cancer.  She previously took diclofenac  for 3 years for shoulder and back pain then switched to Celebrex  200 mg once daily a few months ago.  On ASA 81 mg daily.   She has a history of coronary artery disease status post NSTEMI with DES placement 12/2021 on DAPT x 1 year. She was last  seen in office by her cardiologist Dr. Pietro 06/24/2023, her cardiac status was stable at that time and she was instructed to continue ASA, statin and antihypertensive medications.  She denies having any angina.       Latest Ref Rng & Units 05/16/2023    1:49 PM 04/21/2023   11:34 AM 04/10/2023   10:14 AM  CBC  WBC 4.0 - 10.5 K/uL 11.2  8.7  17.9   Hemoglobin 12.0 - 15.0 g/dL 88.0  88.1  88.8   Hematocrit 36.0 - 46.0 % 37.0  35.9  34.0   Platelets 150.0 - 400.0 K/uL 371.0  562.0  410.0         Latest Ref Rng & Units 04/21/2023   11:34 AM 04/10/2023   10:14 AM 12/11/2022   11:29 AM  CMP  Glucose 70 - 99 mg/dL 77  89  87   BUN 6 - 23 mg/dL 9  9  8    Creatinine 0.40 - 1.20 mg/dL 8.94  8.75  9.17   Sodium 135 - 145 mEq/L 138  133  138   Potassium 3.5 - 5.1 mEq/L 4.2  3.5  4.3   Chloride 96 - 112 mEq/L 102  95  102   CO2 19 - 32 mEq/L 31  29  29    Calcium  8.4 - 10.5 mg/dL 8.9  8.9  9.3   Total Protein 6.0 - 8.3 g/dL 6.6  6.6  7.1   Total Bilirubin 0.2 - 1.2 mg/dL 0.2  0.4  0.3   Alkaline Phos 39 - 117 U/L 73  85  68   AST 0 - 37 U/L 20  13  13    ALT 0 - 35 U/L 26  17  15        CARDIAC CATHETERIZATION 12/28/2021:      Prox LAD lesion is 80% stenosed.   A drug-eluting stent was successfully placed using a STENT ONYX FRONTIER 4.0X15.   Post intervention, there is a 0% residual stenosis.   The left ventricular systolic function is normal.   LV end diastolic pressure is normal.   The left ventricular ejection fraction is 55-65% by visual estimate.   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   Successful PCI of the proximal LAD with IVUS guidance and DES x 1.      ECHO 12/28/2021: IMPRESSIONS Left ventricular ejection fraction, by estimation, is 50 to 55%. The left ventricle has low normal function. The left ventricle demonstrates regional wall motion abnormalities (see scoring diagram/findings for description). There is mild concentric  left ventricular hypertrophy. Left ventricular diastolic parameters are consistent with Grade II diastolic dysfunction (pseudonormalization). Elevated left atrial pressure. 1. Right ventricular systolic function is normal. The right ventricular size is normal. Tricuspid regurgitation signal is inadequate for assessing PA pressure. 2. 3. Left atrial size was mildly dilated. 4. The mitral valve is normal in structure. Mild mitral valve regurgitation. The aortic valve is tricuspid. Aortic valve regurgitation is not visualized. No aortic stenosis is present. 5. The inferior vena cava is normal in size with greater than 50% respiratory variability, suggesting right atrial pressure of 3 mmHg.       Past Medical History:  Diagnosis Date   ADHD (attention deficit hyperactivity disorder)     Anxiety     B12 deficiency     Back pain     Bipolar 1 disorder (HCC)     Bulging lumbar disc     C. difficile diarrhea 04/24/2016    treated with metronidazole   Constipation     Depression     GERD (gastroesophageal reflux disease)     History of chicken pox     History of shingles 2013   Hypertension     IBS (irritable bowel syndrome)     Joint pain     Major depressive disorder     Migraines     Obesity     Obsessive-compulsive disorder     Osteoarthritis     Prediabetes     PTSD (post-traumatic stress disorder)     Sleep apnea     SOB (shortness of breath)               Past Surgical History:  Procedure Laterality Date   BLADDER SURGERY   1981    Urethera stretched   caps on teeth   1980    Caps on all Teeth   CESAREAN SECTION   2003   CORONARY STENT INTERVENTION N/A 12/28/2021    Procedure: CORONARY STENT INTERVENTION;  Surgeon: Swaziland, Peter M, MD;  Location: [facility] INVASIVE CV LAB;  Service: Cardiovascular;  Laterality: N/A;   LEFT HEART CATH AND CORONARY ANGIOGRAPHY N/A 12/28/2021    Procedure: LEFT HEART CATH AND CORONARY ANGIOGRAPHY;  Surgeon: Swaziland, Peter M, MD;  Location[facility] CV  LAB;  Service: Cardiovascular;  Laterality: N/A;   TONSILLECTOMY       WISDOM TOOTH EXTRACTION  Social History: She is married.  She has 1 daughter.  She reports that she quit smoking about 12 years ago. Her smoking use included cigarettes. She started smoking about 32 years ago. She has a 20 pack-year smoking history. She has never used smokeless tobacco. She reports that she does not currently use alcohol. She reports that she does not use drugs.   Family History: family history includes Arthritis in her paternal grandmother; CVA in her maternal grandmother; Dementia in her paternal grandfather and paternal grandmother; Depression in her mother; Eczema in her daughter; Healthy in her sister; Hearing loss in her father and paternal grandmother; Heart disease in her maternal grandmother; Obesity in her father and mother; Osteoporosis in her mother.  No known family history of colon polyps or colorectal cancer.   Allergies       Allergies  Allergen Reactions   Gabapentin  Other (See Comments)      Pt can not tolerated large dose give her mood swings   Prednisone  Other (See Comments)      Severe mood swings   Meloxicam Rash   Penicillins Rash              Outpatient Encounter Medications as of 04/01/2024  Medication Sig   albuterol  (VENTOLIN  HFA) 108 (90 Base) MCG/ACT inhaler Inhale 2 puffs into the lungs every 6 (six) hours as needed for wheezing or shortness of breath.   amphetamine -dextroamphetamine  (ADDERALL XR) 10 MG 24 hr capsule Take 1 capsule (10 mg total) by mouth 2 (two) times daily with breakfast and lunch.   amphetamine -dextroamphetamine  (ADDERALL XR) 10 MG 24 hr capsule Take 1 capsule (10 mg total) by mouth 2 (two) times daily with breakfast and lunch.   amphetamine -dextroamphetamine  (ADDERALL XR) 10 MG 24 hr capsule Take 1 capsule (10 mg total) by mouth 2 (two) times daily with breakfast and lunch.   ARIPiprazole  (ABILIFY ) 10 MG tablet Take 1 tablet (10 mg total) by  mouth daily.   aspirin  EC 81 MG EC tablet Take 1 tablet (81 mg total) by mouth daily. Swallow whole.   benzonatate  (TESSALON  PERLES) 100 MG capsule Take 1 capsule (100 mg total) by mouth 3 (three) times daily as needed.   buPROPion  (WELLBUTRIN  XL) 300 MG 24 hr tablet Take 1 tablet (300 mg total) by mouth daily.   celecoxib  (CELEBREX ) 200 MG capsule Take 1 capsule by mouth twice daily   Cholecalciferol (VITAMIN D3) 10 MCG (400 UNIT) CAPS Take by mouth.   gabapentin  (NEURONTIN ) 300 MG capsule TAKE 1 CAPSULE BY MOUTH THREE TIMES DAILY AS NEEDED FOR  NERVE  PAIN   ketoconazole  (NIZORAL ) 2 % cream APPLY TO AFFECTED AREA(S) ONCE TO TWICE DAILY   lisinopril  (ZESTRIL ) 2.5 MG tablet Take 1 tablet by mouth once daily   LORazepam  (ATIVAN ) 1 MG tablet Take 0.5-1 tablets (0.5-1 mg total) by mouth every 8 (eight) hours as needed for anxiety.   metFORMIN  (GLUCOPHAGE -XR) 500 MG 24 hr tablet Take 2 tablets (1,000 mg total) by mouth daily with breakfast.   metoprolol  succinate (TOPROL -XL) 25 MG 24 hr tablet Take 1 tablet by mouth once daily   Multiple Vitamin (MULTIVITAMIN) tablet Take 1 tablet by mouth daily.   nitroGLYCERIN  (NITROSTAT ) 0.4 MG SL tablet Place 1 tablet (0.4 mg total) under the tongue every 5 (five) minutes x 3 doses as needed for chest pain.   Omega-3 Fatty Acids (FISH OIL PO) Take by mouth.   pantoprazole  (PROTONIX ) 40 MG tablet Take 1 tablet (40 mg total) by  mouth daily.   rosuvastatin  (CRESTOR ) 40 MG tablet Take 1 tablet by mouth once daily   triamcinolone  ointment (KENALOG ) 0.5 % Apply to affected area 1-2 times daily   venlafaxine  XR (EFFEXOR -XR) 150 MG 24 hr capsule Take 2 capsules (300 mg total) by mouth daily.      No facility-administered encounter medications on file as of 04/01/2024.        REVIEW OF SYSTEMS:  Gen: + Fatigue. Denies fever, sweats or chills. No unintentional weight loss.  CV: Denies chest pain, palpitations or edema. Resp: + Shortness of breath, not currently.   GI: See HPI. GU: + Urine leakage.  MS: + Arthritis and back pain. Derm: Denies rash, itchiness, skin lesions or unhealing ulcers. Psych:+ Anxiety and depression. Heme: Denies bruising, easy bleeding. Neuro:  Denies headaches, dizziness or paresthesias. Endo:  Denies any problems with DM, thyroid  or adrenal function.   PHYSICAL EXAM: BP 124/72   Pulse 65   Ht 5' 6 (1.676 m)   Wt 227 lb (103 kg)   BMI 36.64 kg/m       Wt Readings from Last 3 Encounters:  04/01/24 227 lb (103 kg)  02/10/24 228 lb 6.1 oz (103.6 kg)  12/24/23 231 lb 6.1 oz (105 kg)    General: 48 year old female in no acute distress. Head: Normocephalic and atraumatic. Eyes:  Sclerae non-icteric, conjunctive pink. Ears: Normal auditory acuity. Mouth: Dentition intact. No ulcers or lesions.  Neck: Supple, no lymphadenopathy or thyromegaly.  Lungs: Clear bilaterally to auscultation without wheezes, crackles or rhonchi. Heart: Regular rate and rhythm. No murmur, rub or gallop appreciated.  Abdomen: Soft, nontender, nondistended. No masses. No hepatosplenomegaly. Normoactive bowel sounds x 4 quadrants.  Rectal: Deferred.  Musculoskeletal: Symmetrical with no gross deformities. Skin: Warm and dry. No rash or lesions on visible extremities. Extremities: No edema. Neurological: Alert oriented x 4, no focal deficits.  Psychological:  Alert and cooperative. Normal mood and affect.   ASSESSMENT AND PLAN:   48 year old female with GERD and chronic dysphagia with solid food x 5 years which has progressively worsened over the past few months, patient now having difficulty swallowing liquids and pills. Chronic NSAID use.  -Barium swallow study with tablet scheduled 04/19/2024 -EGD with possible esophageal dilatation scheduled 05/14/2024 -Continue Esomeprazole  20 mg daily -Patient instructed to avoid eating large pieces of bread, meat or rice   Colon cancer screening.  Negative Cologuard test 11/25/2023.  Never had a  screening colonoscopy.  No known family history of colon polyps or colorectal cancer. - Recommended a conventional colonoscopy versus repeat Cologuard test 11/2026   Altered bowel pattern, soft to loose stools, last solid stool was 11/2023. - Avoid dairy products - Benefiber 1 tablespoon daily as tolerated - Consider a diagnostic colonoscopy to rule out microscopic colitis if loose stools persist   Coronary artery disease status post NSTEMI with DES placement 12/2021, previously on DAPT x 1 year then only ASA 81mg  every day.  LVEF 50 to 55% per echo 12/2021.   Mild normocytic anemia.  No overt GI bleeding. -CBC, IBC + Ferritin, B12 and folate level   CC:  Job Lukes, [place]    Recent H&P as above.  Interval barium esophagram reviewed.  No gross abnormalities.  Now for upper endoscopy with esophageal dilation

## 2024-05-14 NOTE — Patient Instructions (Signed)

## 2024-05-14 NOTE — Op Note (Signed)
 [facility] Patient Name: Crystal Bean Procedure Date: 05/14/2024 2:16 PM MRN: [id number] Endoscopist: Norleen SAILOR. Abran , MD, [id number] Age: 48 Referring MD:  Date of Birth: [birth year] Gender: Female Account #: [id number] Procedure:                Upper GI endoscopy with balloon dilation of the                            esophagus. 18-20 mm max Indications:              Therapeutic procedure, Dysphagia, Esophageal reflux Medicines:                Monitored Anesthesia Care Procedure:                Pre-Anesthesia Assessment:                           - Prior to the procedure, a History and Physical                            was performed, and patient medications and                            allergies were reviewed. The patient's tolerance of                            previous anesthesia was also reviewed. The risks                            and benefits of the procedure and the sedation                            options and risks were discussed with the patient.                            All questions were answered, and informed consent                            was obtained. [Anticoagulant Agents] [Ijbd Prior to                            Procedure]. [ASA Grade]. After reviewing the risks                            and benefits, the patient was deemed in                            satisfactory condition to undergo the procedure.                           After obtaining informed consent, the endoscope was                            passed under direct vision. Throughout the  procedure, the patient's blood pressure, pulse, and                            oxygen saturations were monitored continuously. The                            Endoscope was introduced through the mouth, and                            advanced to the second part of duodenum. The upper                            GI endoscopy was accomplished without difficulty.                             The patient tolerated the procedure well. Scope In: Scope Out: Findings:                 The esophagus was normal. A TTS dilator was passed                            through the scope. Dilation with an 18-19-20 mm                            balloon dilator was performed to 20 mm. No                            resistance or mucosal disruption.                           The stomach was normal.                           The examined duodenum was normal.                           The cardia and gastric fundus were normal on                            retroflexion. Complications:            No immediate complications. Estimated Blood Loss:     Estimated blood loss: none. Impression:               - Normal esophagus. Empirically dilated.                           - Normal stomach.                           - Normal examined duodenum.                           - No specimens collected. Recommendation:           - Patient has a contact number available for  emergencies. The signs and symptoms of potential                            delayed complications were discussed with the                            patient. Return to normal activities tomorrow.                            Written discharge instructions were provided to the                            patient.                           - Resume previous diet.                           - Continue present medications. Norleen SAILOR. Abran, MD 05/14/2024 2:45:24 PM This report has been signed electronically.

## 2024-05-14 NOTE — Progress Notes (Signed)
 Report to PACU, RN, vss, BBS= Clear.

## 2024-05-14 NOTE — Progress Notes (Signed)
 Updated medical information

## 2024-05-15 ENCOUNTER — Other Ambulatory Visit: Payer: Self-pay | Admitting: Physician Assistant

## 2024-05-17 ENCOUNTER — Telehealth: Payer: Self-pay

## 2024-05-17 NOTE — Telephone Encounter (Signed)
  Follow up Call-     05/14/2024    1:22 PM  Call back number  Post procedure Call Back phone  # [phone]  Permission to leave phone message Yes     Patient questions:  Do you have a fever, pain , or abdominal swelling? No. Pain Score  0 *  Have you tolerated food without any problems? Yes.    Have you been able to return to your normal activities? Yes.    Do you have any questions about your discharge instructions: Diet   No. Medications  No. Follow up visit  No.  Do you have questions or concerns about your Care? No.  Actions: * If pain score is 4 or above: No action needed, pain <4.

## 2024-05-20 ENCOUNTER — Ambulatory Visit

## 2024-06-01 ENCOUNTER — Ambulatory Visit

## 2024-06-07 ENCOUNTER — Encounter (HOSPITAL_BASED_OUTPATIENT_CLINIC_OR_DEPARTMENT_OTHER): Payer: Self-pay

## 2024-06-07 ENCOUNTER — Other Ambulatory Visit (HOSPITAL_BASED_OUTPATIENT_CLINIC_OR_DEPARTMENT_OTHER): Payer: Self-pay | Admitting: Student

## 2024-06-07 DIAGNOSIS — M545 Low back pain, unspecified: Secondary | ICD-10-CM

## 2024-06-08 ENCOUNTER — Telehealth: Admitting: Physician Assistant

## 2024-06-08 ENCOUNTER — Encounter: Payer: Self-pay | Admitting: Physician Assistant

## 2024-06-08 DIAGNOSIS — F902 Attention-deficit hyperactivity disorder, combined type: Secondary | ICD-10-CM | POA: Diagnosis not present

## 2024-06-08 DIAGNOSIS — F319 Bipolar disorder, unspecified: Secondary | ICD-10-CM | POA: Diagnosis not present

## 2024-06-08 DIAGNOSIS — F422 Mixed obsessional thoughts and acts: Secondary | ICD-10-CM

## 2024-06-08 DIAGNOSIS — F411 Generalized anxiety disorder: Secondary | ICD-10-CM

## 2024-06-08 MED ORDER — ARIPIPRAZOLE 15 MG PO TABS: 15.0000 mg | ORAL_TABLET | Freq: Every day | ORAL | 1 refills | Status: DC

## 2024-06-08 NOTE — Progress Notes (Signed)
 [facility]  Patient ID: Crystal Bean,  MRN: [id number]  PCP: Job Lukes, PA  Date of Evaluation: 06/08/2024 Time spent:30 minutes  Chief Complaint:  Chief Complaint   ADHD; Depression    Virtual Visit via Telehealth  I connected with patient by a video enabled telemedicine application, with their informed consent, and verified patient privacy and that I am speaking with the correct person using two identifiers.  I am private, in my office and the patient is at home.  I discussed the limitations, risks, security and privacy concerns of performing an evaluation and management service by video and the availability of in person appointments. I also discussed with the patient that there may be a patient responsible charge related to this service. The patient expressed understanding and agreed to proceed.   I discussed the assessment and treatment plan with the patient. The patient was provided an opportunity to ask questions and all were answered. The patient agreed with the plan and demonstrated an understanding of the instructions.   The patient was advised to call back or seek an in-person evaluation if the symptoms worsen or if the condition fails to improve as anticipated.  I provided 30 minutes of non-face-to-face time during this encounter.  HISTORY/CURRENT STATUS: HPI For routine med check.  Daughter dx w/ OCD.  Pt has similar behaviors, picks at her skin until she bleeds and scabs over.  It's a compulsion, I have to do it. Twirls at her hair.  Also repetitive actions like tapping, although that's better.  Has rituals in the shower, checking locks at night. She doesn't touch meat, her husband has to cook all that. She's a Engineer, production and overdoes it with handwashing. No PA but does get overwhelmed.   Patient is able to enjoy things.  Energy and motivation are good.  No extreme sadness, tearfulness, or feelings of hopelessness.  Sleeps well most of the time. ADLs and  personal hygiene are normal.   Denies any changes in concentration, making decisions, or remembering things.  Appetite has not changed.  Weight is stable.  No mania, delirium, AH/VH.  No SI/HI.  States that attention is good without easy distractibility.  Able to focus on things and finish tasks to completion.   Denies dizziness, syncope, seizures, numbness, tingling, tremor, tics, unsteady gait, slurred speech, confusion. Denies muscle or joint pain, stiffness, or dystonia. Denies unexplained weight loss, frequent infections, or sores that heal slowly.  No polyphagia, polydipsia, or polyuria. Denies visual changes or paresthesias.   Individual Medical History/ Review of Systems: Changes? :No     Past medications for mental health diagnoses include: Prozac, Celexa, Lexapro, Wellbutrin , Effexor  XR, Pristiq, Abilify ,  Adderall, Ritalin, Klonopin, Xanax , Gabapentin , Buspar  Allergies: Gabapentin , Meloxicam, Penicillins, and Prednisone   Current Medications:  Current Outpatient Medications:    acetaminophen  (TYLENOL ) 650 MG CR tablet, Take 650 mg by mouth every 8 (eight) hours as needed for pain., Disp: , Rfl:    albuterol  (VENTOLIN  HFA) 108 (90 Base) MCG/ACT inhaler, Inhale 2 puffs into the lungs every 6 (six) hours as needed for wheezing or shortness of breath., Disp: 8 g, Rfl: 0   amphetamine -dextroamphetamine  (ADDERALL XR) 10 MG 24 hr capsule, Take 1 capsule (10 mg total) by mouth 2 (two) times daily with breakfast and lunch., Disp: 60 capsule, Rfl: 0   amphetamine -dextroamphetamine  (ADDERALL XR) 10 MG 24 hr capsule, Take 1 capsule (10 mg total) by mouth 2 (two) times daily with breakfast and lunch., Disp: 60 capsule, Rfl:  0   [START ON 07/04/2024] amphetamine -dextroamphetamine  (ADDERALL XR) 10 MG 24 hr capsule, Take 1 capsule (10 mg total) by mouth 2 (two) times daily with breakfast and lunch., Disp: 60 capsule, Rfl: 0   ARIPiprazole  (ABILIFY ) 15 MG tablet, Take 1 tablet (15 mg total) by mouth  daily., Disp: 90 tablet, Rfl: 1   aspirin  EC 81 MG EC tablet, Take 1 tablet (81 mg total) by mouth daily. Swallow whole., Disp: 30 tablet, Rfl: 11   buPROPion  (WELLBUTRIN  XL) 300 MG 24 hr tablet, Take 1 tablet (300 mg total) by mouth daily., Disp: 90 tablet, Rfl: 1   Cholecalciferol (VITAMIN D3) 10 MCG (400 UNIT) CAPS, Take by mouth., Disp: , Rfl:    esomeprazole  (NEXIUM ) 20 MG capsule, Take 1 capsule (20 mg total) by mouth daily before breakfast., Disp: 90 capsule, Rfl: 3   lisinopril  (ZESTRIL ) 2.5 MG tablet, Take 1 tablet by mouth once daily, Disp: 90 tablet, Rfl: 3   metFORMIN  (GLUCOPHAGE -XR) 500 MG 24 hr tablet, Take 2 tablets (1,000 mg total) by mouth daily with breakfast., Disp: 180 tablet, Rfl: 1   metoprolol  succinate (TOPROL -XL) 25 MG 24 hr tablet, Take 1 tablet by mouth once daily, Disp: 90 tablet, Rfl: 0   Multiple Vitamin (MULTIVITAMIN) tablet, Take 1 tablet by mouth daily., Disp: , Rfl:    Omega-3 Fatty Acids (FISH OIL PO), Take by mouth., Disp: , Rfl:    rosuvastatin  (CRESTOR ) 40 MG tablet, Take 1 tablet by mouth once daily, Disp: 90 tablet, Rfl: 3   venlafaxine  XR (EFFEXOR -XR) 150 MG 24 hr capsule, Take 2 capsules (300 mg total) by mouth daily., Disp: 180 capsule, Rfl: 1   celecoxib  (CELEBREX ) 200 MG capsule, Take 1 capsule by mouth twice daily, Disp: 60 capsule, Rfl: 0   gabapentin  (NEURONTIN ) 300 MG capsule, TAKE 1 CAPSULE BY MOUTH THREE TIMES DAILY AS NEEDED FOR  NERVE  PAIN., Disp: 90 capsule, Rfl: 0   ketoconazole  (NIZORAL ) 2 % cream, APPLY TO AFFECTED AREA(S) ONCE TO TWICE DAILY (Patient not taking: Reported on 05/14/2024), Disp: 30 g, Rfl: 0   nitroGLYCERIN  (NITROSTAT ) 0.4 MG SL tablet, Place 1 tablet (0.4 mg total) under the tongue every 5 (five) minutes x 3 doses as needed for chest pain. (Patient not taking: Reported on 04/22/2024), Disp: 25 tablet, Rfl: 12   triamcinolone  ointment (KENALOG ) 0.5 %, Apply to affected area 1-2 times daily (Patient not taking: Reported on  04/22/2024), Disp: 15 g, Rfl: 0  Current Facility-Administered Medications:    0.9 %  sodium chloride  infusion, 500 mL, Intravenous, Once, Abran Norleen SAILOR, MD Medication Side Effects: none  Family Medical/ Social History: Changes? no  MENTAL HEALTH EXAM:  Last menstrual period 05/11/2024.There is no height or weight on file to calculate BMI.  General Appearance: Casual, Well Groomed and Obese  Eye Contact:  Good  Speech:  Clear and Coherent and Normal Rate  Volume:  Normal  Mood:  Euthymic  Affect:  Congruent  Thought Process:  Goal Directed and Descriptions of Associations: Circumstantial  Orientation:  Full (Time, Place, and Person)  Thought Content: Logical   Suicidal Thoughts:  No  Homicidal Thoughts:  No  Memory:  WNL  Judgement:  Good  Insight:  Good  Psychomotor Activity:  Normal  Concentration:  Concentration: Good and Attention Span: Good  Recall:  Good  Fund of Knowledge: Good  Language: Good  Assets:  Communication Skills Desire for Improvement Financial Resources/Insurance Housing Resilience Social Support Talents/Skills Transportation  ADL's:  Intact  Cognition: WNL  Prognosis:  Good   DIAGNOSES:    ICD-10-CM   1. Mixed obsessional thoughts and acts  F42.2     2. Bipolar I disorder (HCC)  F31.9     3. Generalized anxiety disorder  F41.1     4. Attention deficit hyperactivity disorder (ADHD), combined type  F90.2       Receiving Psychotherapy: Yes  Darryle Boos   RECOMMENDATIONS:  PDMP was reviewed.  Adderall filled 05/08/2024.  Gabapentin  filled 05/15/2024.  Ativan  filled 02/08/2024.   I provided approximately 30  minutes of non face to face time during this encounter, including time spent before and after the visit in records review, medical decision making, counseling pertinent to today's visit, and charting.   Disc OCD. Tx options are: SSRIs which I don't recommend since she's never responded well for dep or anx. Clomipramine is an option but with  her heart hx, I'd want to disc w/ card before I gave that. Namenda is also an option.  Neither of us  want to add another med if we can help it. Increasing Abilify  that she's already on is the best option at this point. She understands and agrees.   Continue Adderall XR 10 mg, 1 p.o. at breakfast and 1 p.o. at lunch. Increase Abilify  to 15 mg po q am. Continue Wellbutrin  XL 300 mg, 1 p.o. daily. Continue gabapentin  100 mg, 1-3 daily as directed. Continue Effexor  XR 150 mg, 2 p.o. every morning. Continue therapy. Return in 3 months.    Verneita Cooks, PA-C

## 2024-06-09 ENCOUNTER — Other Ambulatory Visit: Payer: Self-pay | Admitting: Family Medicine

## 2024-06-09 NOTE — Telephone Encounter (Signed)
 Last OV 06/24/23 Next OV not scheduled  Last refill 04/05/24 Qty #90/1  Needs OV

## 2024-06-10 ENCOUNTER — Other Ambulatory Visit: Payer: Self-pay | Admitting: Physician Assistant

## 2024-06-15 ENCOUNTER — Other Ambulatory Visit: Payer: Self-pay

## 2024-06-15 MED ORDER — LISINOPRIL 2.5 MG PO TABS: 2.5000 mg | ORAL_TABLET | Freq: Every day | ORAL | 0 refills | Status: DC

## 2024-06-15 MED ORDER — METOPROLOL SUCCINATE ER 25 MG PO TB24: 25.0000 mg | ORAL_TABLET | Freq: Every day | ORAL | 0 refills | Status: DC

## 2024-06-15 MED ORDER — ROSUVASTATIN CALCIUM 40 MG PO TABS: 40.0000 mg | ORAL_TABLET | Freq: Every day | ORAL | 0 refills | Status: DC

## 2024-06-17 ENCOUNTER — Other Ambulatory Visit: Payer: Self-pay | Admitting: *Deleted

## 2024-06-17 MED ORDER — METFORMIN HCL ER 500 MG PO TB24: 1000.0000 mg | ORAL_TABLET | Freq: Every day | ORAL | 1 refills | Status: DC

## 2024-06-30 ENCOUNTER — Other Ambulatory Visit: Payer: Self-pay | Admitting: Cardiology

## 2024-07-06 ENCOUNTER — Other Ambulatory Visit: Payer: Self-pay | Admitting: Cardiology

## 2024-07-21 ENCOUNTER — Encounter: Payer: Self-pay | Admitting: Physician Assistant

## 2024-07-21 ENCOUNTER — Ambulatory Visit (INDEPENDENT_AMBULATORY_CARE_PROVIDER_SITE_OTHER): Admitting: Physician Assistant

## 2024-07-21 ENCOUNTER — Ambulatory Visit

## 2024-07-21 VITALS — BP 110/68 | HR 64 | Temp 97.9°F | Ht 66.0 in | Wt 231.2 lb

## 2024-07-21 DIAGNOSIS — M79602 Pain in left arm: Secondary | ICD-10-CM

## 2024-07-21 DIAGNOSIS — M2550 Pain in unspecified joint: Secondary | ICD-10-CM | POA: Diagnosis not present

## 2024-07-21 DIAGNOSIS — M79601 Pain in right arm: Secondary | ICD-10-CM | POA: Diagnosis not present

## 2024-07-21 DIAGNOSIS — N951 Menopausal and female climacteric states: Secondary | ICD-10-CM | POA: Diagnosis not present

## 2024-07-21 NOTE — Progress Notes (Signed)
 Crystal Bean is a 48 y.o. female here for a follow up of a pre-existing problem.  History of Present Illness:   Chief Complaint  Patient presents with   Arm Pain    Pt was c/o bilateral pain, like fiberglass in both forearms on the underside x 3 days.    Discussed the use of AI scribe software for clinical note transcription with the patient, who gave verbal consent to proceed.  History of Present Illness Crystal Bean is a 48 year old female who presents with intermittent arm pain and concerns about fibromyalgia and perimenopause.  For the past three days, she has experienced a sensation in her arms described as feeling like 'fiberglass stuck in them,' primarily on the soft side of the arms. The sensation is intermittent and triggered by light touch. The discomfort resolved spontaneously without medication or topical treatment. There is no recent trauma to the arms.  She is undergoing physical therapy for her neck and lower back, involving significant manipulation of the neck and shoulder area. She takes gabapentin  twice daily and has not missed any doses.   Her mother-in-law and physical therapist suggested fibromyalgia. She feels 'tired and weak all the time,' and her mother-in-law noted a difference in the fascia during massage therapy. She has not had any recent autoimmune workup, but a workup three years ago, including an ANA and rheumatoid factor test, was normal. There is no family history of rheumatoid arthritis or other autoimmune conditions.  She is experiencing changes in her menstrual cycle, with periods becoming irregular and lighter. Last Wednesday, she had a one-day period, much lighter than usual. Her periods are getting later each month. She also experiences mood swings, night sweats, and random pains, which she associates with perimenopausal changes.    Past Medical History:  Diagnosis Date   ADHD (attention deficit hyperactivity disorder)    Anxiety    B12  deficiency    Back pain    Bipolar 1 disorder (HCC)    Bulging lumbar disc    C. difficile diarrhea 04/24/2016   treated with metronidazole   Constipation    Depression    GERD (gastroesophageal reflux disease)    Heart attack (HCC) 12/2021   History of chicken pox    History of shingles 2013   Hypertension    IBS (irritable bowel syndrome)    Joint pain    Kidney stones    once   Major depressive disorder    Migraines    Obesity    Obsessive-compulsive disorder    Osteoarthritis    Prediabetes    PTSD (post-traumatic stress disorder)    Sleep apnea    SOB (shortness of breath)      Social History   Tobacco Use   Smoking status: Former    Current packs/day: 0.00    Average packs/day: 1 pack/day for 20.0 years (20.0 ttl pk-yrs)    Types: Cigarettes    Start date: 06/19/1991    Quit date: 06/19/2011    Years since quitting: 13.0   Smokeless tobacco: Never  Vaping Use   Vaping status: Never Used  Substance Use Topics   Alcohol use: Not Currently   Drug use: Yes    Types: Marijuana    Comment: 05/13/24 last use    Past Surgical History:  Procedure Laterality Date   BLADDER SURGERY  1981   Urethera stretched   caps on teeth  1980   Caps on all Teeth   CESAREAN SECTION  2003   CORONARY STENT INTERVENTION N/A 12/28/2021   Procedure: CORONARY STENT INTERVENTION;  Surgeon: Swaziland, Peter M, MD;  Location: [facility] INVASIVE CV LAB;  Service: Cardiovascular;  Laterality: N/A;   LEFT HEART CATH AND CORONARY ANGIOGRAPHY N/A 12/28/2021   Procedure: LEFT HEART CATH AND CORONARY ANGIOGRAPHY;  Surgeon: Swaziland, Peter M, MD;  Location: [facility] INVASIVE CV LAB;  Service: Cardiovascular;  Laterality: N/A;   TONSILLECTOMY     WISDOM TOOTH EXTRACTION      Family History  Problem Relation Age of Onset   Osteoporosis Mother    Depression Mother    Obesity Mother    Hearing loss Father    Obesity Father    Healthy Sister    CVA Maternal Grandmother    Heart disease Maternal Grandmother     Arthritis Paternal Grandmother    Hearing loss Paternal Grandmother    Dementia Paternal Grandmother    Dementia Paternal Grandfather    Eczema Daughter    Colon cancer Neg Hx    Esophageal cancer Neg Hx    Rectal cancer Neg Hx    Stomach cancer Neg Hx     Allergies  Allergen Reactions   Gabapentin  Other (See Comments)    Pt can not tolerated large dose give her mood swings   Meloxicam Rash   Penicillins Rash   Prednisone  Other (See Comments)    Severe mood swings    Current Medications:   Current Outpatient Medications:    acetaminophen  (TYLENOL ) 650 MG CR tablet, Take 650 mg by mouth every 8 (eight) hours as needed for pain., Disp: , Rfl:    albuterol  (VENTOLIN  HFA) 108 (90 Base) MCG/ACT inhaler, Inhale 2 puffs into the lungs every 6 (six) hours as needed for wheezing or shortness of breath., Disp: 8 g, Rfl: 0   amphetamine -dextroamphetamine  (ADDERALL XR) 10 MG 24 hr capsule, Take 1 capsule (10 mg total) by mouth 2 (two) times daily with breakfast and lunch., Disp: 60 capsule, Rfl: 0   amphetamine -dextroamphetamine  (ADDERALL XR) 10 MG 24 hr capsule, Take 1 capsule (10 mg total) by mouth 2 (two) times daily with breakfast and lunch., Disp: 60 capsule, Rfl: 0   amphetamine -dextroamphetamine  (ADDERALL XR) 10 MG 24 hr capsule, Take 1 capsule (10 mg total) by mouth 2 (two) times daily with breakfast and lunch., Disp: 60 capsule, Rfl: 0   ARIPiprazole  (ABILIFY ) 15 MG tablet, Take 1 tablet (15 mg total) by mouth daily., Disp: 90 tablet, Rfl: 1   aspirin  EC 81 MG EC tablet, Take 1 tablet (81 mg total) by mouth daily. Swallow whole., Disp: 30 tablet, Rfl: 11   buPROPion  (WELLBUTRIN  XL) 300 MG 24 hr tablet, Take 1 tablet (300 mg total) by mouth daily., Disp: 90 tablet, Rfl: 1   Cholecalciferol (VITAMIN D3) 10 MCG (400 UNIT) CAPS, Take by mouth., Disp: , Rfl:    esomeprazole  (NEXIUM ) 20 MG capsule, Take 1 capsule (20 mg total) by mouth daily before breakfast., Disp: 90 capsule, Rfl: 3    gabapentin  (NEURONTIN ) 300 MG capsule, TAKE 1 CAPSULE BY MOUTH THREE TIMES DAILY AS NEEDED FOR  NERVE  PAIN., Disp: 90 capsule, Rfl: 0   ketoconazole  (NIZORAL ) 2 % cream, APPLY TO AFFECTED AREA(S) ONCE TO TWICE DAILY, Disp: 30 g, Rfl: 0   lisinopril  (ZESTRIL ) 2.5 MG tablet, Take 1 tablet (2.5 mg total) by mouth daily. PATIENT MUST KEEP UPCOMING APPOINTMENT FOR FURTHER REFILLS, Disp: 90 tablet, Rfl: 0   metFORMIN  (GLUCOPHAGE -XR) 500 MG 24 hr tablet, Take 2  tablets (1,000 mg total) by mouth daily with breakfast., Disp: 180 tablet, Rfl: 1   metoprolol  succinate (TOPROL -XL) 25 MG 24 hr tablet, Take 1 tablet (25 mg total) by mouth daily. PLEASE KEEP UPCOMING APPOINTMENT IN ORDER TO RECEIVE ADDITIONAL REFILLS, THANK YOU!, Disp: 30 tablet, Rfl: 1   Multiple Vitamin (MULTIVITAMIN) tablet, Take 1 tablet by mouth daily., Disp: , Rfl:    nitroGLYCERIN  (NITROSTAT ) 0.4 MG SL tablet, Place 1 tablet (0.4 mg total) under the tongue every 5 (five) minutes x 3 doses as needed for chest pain., Disp: 25 tablet, Rfl: 12   Omega-3 Fatty Acids (FISH OIL PO), Take by mouth., Disp: , Rfl:    rosuvastatin  (CRESTOR ) 40 MG tablet, Take 1 tablet (40 mg total) by mouth daily. PATIENT MUST KEEP UPCOMING APPOINTMENT FOR FURTHER REFILLS, Disp: 90 tablet, Rfl: 0   triamcinolone  ointment (KENALOG ) 0.5 %, Apply to affected area 1-2 times daily, Disp: 15 g, Rfl: 0   venlafaxine  XR (EFFEXOR -XR) 150 MG 24 hr capsule, Take 2 capsules (300 mg total) by mouth daily., Disp: 180 capsule, Rfl: 1  Current Facility-Administered Medications:    0.9 %  sodium chloride  infusion, 500 mL, Intravenous, Once, Abran Norleen SAILOR, MD   Review of Systems:   Negative unless otherwise specified per HPI.  Vitals:   Vitals:   07/21/24 1058  BP: 110/68  Pulse: 64  Temp: 97.9 F (36.6 C)  TempSrc: Temporal  SpO2: 95%  Weight: 231 lb 4 oz (104.9 kg)  Height: 5' 6 (1.676 m)     Body mass index is 37.32 kg/m.  Physical Exam:   Physical  Exam Constitutional:      Appearance: Normal appearance. She is well-developed.  HENT:     Head: Normocephalic and atraumatic.  Eyes:     General: Lids are normal.     Extraocular Movements: Extraocular movements intact.     Conjunctiva/sclera: Conjunctivae normal.  Pulmonary:     Effort: Pulmonary effort is normal.  Musculoskeletal:        General: Normal range of motion.     Cervical back: Normal range of motion and neck supple.  Skin:    General: Skin is warm and dry.  Neurological:     Mental Status: She is alert and oriented to person, place, and time.  Psychiatric:        Attention and Perception: Attention and perception normal.        Mood and Affect: Mood normal.        Behavior: Behavior normal.        Thought Content: Thought content normal.        Judgment: Judgment normal.     Assessment and Plan:   Assessment and Plan Assessment & Plan Bilateral arm pain Intermittent arm pain resolved spontaneously. Possible relation to neck issues and physical therapy. Gabapentin  taken consistently. - Consider neck imaging if symptoms recur. - Avoid nerve conduction studies as per her preference.  Arthralgia Symptoms of fatigue and weakness. Discussed fibromyalgia as a diagnosis of exclusion. Previous autoimmune workup unremarkable. Explained management through lifestyle modifications. - Encourage regular exercise and weight management. - Pursue massage therapy. - Maintain a healthy diet.  Perimenopausal symptoms Irregular menstrual cycles, mood swings, and night sweats suggest perimenopause. Discussed menopause diagnosis criteria and non-hormonal treatment options. Gabapentin  prescribed for symptoms. - Track menstrual cycles using an app or calendar. - Consider non-hormonal treatment options if needed such as Veozah     Kendra Woolford, PA-C

## 2024-08-04 ENCOUNTER — Other Ambulatory Visit: Payer: Self-pay | Admitting: Physician Assistant

## 2024-08-04 DIAGNOSIS — Z1231 Encounter for screening mammogram for malignant neoplasm of breast: Secondary | ICD-10-CM

## 2024-08-09 ENCOUNTER — Encounter: Payer: Self-pay | Admitting: Physician Assistant

## 2024-08-09 ENCOUNTER — Telehealth: Payer: Self-pay | Admitting: Physician Assistant

## 2024-08-09 NOTE — Telephone Encounter (Unsigned)
  Pt needs rf of Adderall   [facility] [address]

## 2024-08-10 ENCOUNTER — Other Ambulatory Visit: Payer: Self-pay | Admitting: Family Medicine

## 2024-08-10 MED ORDER — ESOMEPRAZOLE MAGNESIUM 20 MG PO CPDR: 20.0000 mg | DELAYED_RELEASE_CAPSULE | Freq: Every day | ORAL | 3 refills | Status: AC

## 2024-08-11 ENCOUNTER — Other Ambulatory Visit: Payer: Self-pay

## 2024-08-11 ENCOUNTER — Telehealth: Payer: Self-pay | Admitting: Physician Assistant

## 2024-08-11 DIAGNOSIS — F902 Attention-deficit hyperactivity disorder, combined type: Secondary | ICD-10-CM

## 2024-08-11 MED ORDER — AMPHETAMINE-DEXTROAMPHET ER 10 MG PO CP24: 10.0000 mg | ORAL_CAPSULE | Freq: Two times a day (BID) | ORAL | 0 refills | Status: DC

## 2024-08-11 NOTE — Telephone Encounter (Signed)
   Pt need rf of Adderall. [facility]roginal msg [facility] [address] and not the [employer] .

## 2024-08-11 NOTE — Telephone Encounter (Signed)
 Last OV 06/24/23 Next OV not scheduled  Last refill 06/09/24 Qty #90/0  Pt needs OV, has not been seen in > 1 year

## 2024-08-11 NOTE — Telephone Encounter (Signed)
Pended to [facility], canceled at [facility].

## 2024-08-11 NOTE — Telephone Encounter (Signed)
 Pended

## 2024-08-13 ENCOUNTER — Encounter: Payer: Self-pay | Admitting: Physician Assistant

## 2024-08-16 ENCOUNTER — Other Ambulatory Visit: Payer: Self-pay | Admitting: Physician Assistant

## 2024-08-16 MED ORDER — GABAPENTIN 300 MG PO CAPS: 300.0000 mg | ORAL_CAPSULE | Freq: Three times a day (TID) | ORAL | 0 refills | Status: DC | PRN

## 2024-08-16 NOTE — Telephone Encounter (Signed)
 Please advise if okay to fill Gabapentin  300 mg three times a day prn for nerve pain? It was being prescribed by Dr. Joane.

## 2024-08-23 ENCOUNTER — Ambulatory Visit: Admitting: Physician Assistant

## 2024-08-27 ENCOUNTER — Encounter: Payer: Self-pay | Admitting: Physician Assistant

## 2024-08-27 ENCOUNTER — Ambulatory Visit: Admitting: Physician Assistant

## 2024-08-27 DIAGNOSIS — F411 Generalized anxiety disorder: Secondary | ICD-10-CM | POA: Diagnosis not present

## 2024-08-27 DIAGNOSIS — F319 Bipolar disorder, unspecified: Secondary | ICD-10-CM | POA: Diagnosis not present

## 2024-08-27 DIAGNOSIS — F902 Attention-deficit hyperactivity disorder, combined type: Secondary | ICD-10-CM | POA: Diagnosis not present

## 2024-08-27 DIAGNOSIS — F422 Mixed obsessional thoughts and acts: Secondary | ICD-10-CM

## 2024-08-27 MED ORDER — BUPROPION HCL ER (XL) 300 MG PO TB24: 300.0000 mg | ORAL_TABLET | Freq: Every day | ORAL | 1 refills | Status: AC

## 2024-08-27 MED ORDER — AMPHETAMINE-DEXTROAMPHET ER 10 MG PO CP24: 10.0000 mg | ORAL_CAPSULE | Freq: Two times a day (BID) | ORAL | 0 refills | Status: AC

## 2024-08-27 MED ORDER — ALPRAZOLAM 1 MG PO TABS: 0.5000 mg | ORAL_TABLET | Freq: Two times a day (BID) | ORAL | 0 refills | Status: DC | PRN

## 2024-08-27 MED ORDER — VENLAFAXINE HCL ER 150 MG PO CP24: 300.0000 mg | ORAL_CAPSULE | Freq: Every day | ORAL | 1 refills | Status: AC

## 2024-08-27 MED ORDER — ARIPIPRAZOLE 15 MG PO TABS: 15.0000 mg | ORAL_TABLET | Freq: Every day | ORAL | 1 refills | Status: DC

## 2024-08-27 NOTE — Progress Notes (Signed)
 [facility]  Patient ID: Crystal Bean,  MRN: [id number]  PCP: Job Lukes, PA  Date of Evaluation: 08/27/2024 Time spent:20 minutes  Chief Complaint:  Chief Complaint   ADD; Depression; Follow-up    HISTORY/CURRENT STATUS: HPI For routine med check.  Doing really well.  Feels like her meds are effective. Patient is able to enjoy things. Likes baking.  Energy and motivation are good.   No extreme sadness, tearfulness, or feelings of hopelessness.  Sleeps well most of the time. ADLs and personal hygiene are normal.   No changes in concentration, making decisions, or remembering things.  Appetite has not changed.  Weight is stable.  Anxiety is controlled.  No mania, delirium, AH/VH.  No SI/HI.  Has more anxiety and requests another Rx for Xanax . No PA, just feels overwhelmed. Xanax  has been helpful in the past.   Individual Medical History/ Review of Systems: Changes? :No     Past medications for mental health diagnoses include: Prozac, Celexa, Lexapro, Wellbutrin , Effexor  XR, Pristiq, Abilify ,  Adderall, Ritalin, Klonopin, Xanax , Gabapentin , Buspar  Allergies: Gabapentin , Meloxicam, Penicillins, and Prednisone   Current Medications:  Current Outpatient Medications:    acetaminophen  (TYLENOL ) 650 MG CR tablet, Take 650 mg by mouth every 8 (eight) hours as needed for pain., Disp: , Rfl:    albuterol  (VENTOLIN  HFA) 108 (90 Base) MCG/ACT inhaler, Inhale 2 puffs into the lungs every 6 (six) hours as needed for wheezing or shortness of breath., Disp: 8 g, Rfl: 0   aspirin  EC 81 MG EC tablet, Take 1 tablet (81 mg total) by mouth daily. Swallow whole., Disp: 30 tablet, Rfl: 11   Cholecalciferol (VITAMIN D3) 10 MCG (400 UNIT) CAPS, Take by mouth., Disp: , Rfl:    esomeprazole  (NEXIUM ) 20 MG capsule, Take 1 capsule (20 mg total) by mouth daily before breakfast., Disp: 90 capsule, Rfl: 3   gabapentin  (NEURONTIN ) 300 MG capsule, Take 1 capsule (300 mg total) by mouth 3  (three) times daily as needed., Disp: 270 capsule, Rfl: 0   ketoconazole  (NIZORAL ) 2 % cream, APPLY TO AFFECTED AREA(S) ONCE TO TWICE DAILY, Disp: 30 g, Rfl: 0   lisinopril  (ZESTRIL ) 2.5 MG tablet, Take 1 tablet (2.5 mg total) by mouth daily. PATIENT MUST KEEP UPCOMING APPOINTMENT FOR FURTHER REFILLS, Disp: 90 tablet, Rfl: 0   metFORMIN  (GLUCOPHAGE -XR) 500 MG 24 hr tablet, Take 2 tablets (1,000 mg total) by mouth daily with breakfast., Disp: 180 tablet, Rfl: 1   metoprolol  succinate (TOPROL -XL) 25 MG 24 hr tablet, Take 1 tablet (25 mg total) by mouth daily. PLEASE KEEP UPCOMING APPOINTMENT IN ORDER TO RECEIVE ADDITIONAL REFILLS, THANK YOU!, Disp: 30 tablet, Rfl: 1   Multiple Vitamin (MULTIVITAMIN) tablet, Take 1 tablet by mouth daily., Disp: , Rfl:    nitroGLYCERIN  (NITROSTAT ) 0.4 MG SL tablet, Place 1 tablet (0.4 mg total) under the tongue every 5 (five) minutes x 3 doses as needed for chest pain., Disp: 25 tablet, Rfl: 12   rosuvastatin  (CRESTOR ) 40 MG tablet, Take 1 tablet (40 mg total) by mouth daily. PATIENT MUST KEEP UPCOMING APPOINTMENT FOR FURTHER REFILLS, Disp: 90 tablet, Rfl: 0   triamcinolone  ointment (KENALOG ) 0.5 %, Apply to affected area 1-2 times daily, Disp: 15 g, Rfl: 0   ALPRAZolam  (XANAX ) 1 MG tablet, Take 0.5-1 tablets (0.5-1 mg total) by mouth 2 (two) times daily as needed for anxiety. Use sparingly., Disp: 30 tablet, Rfl: 0   [START ON 11/09/2024] amphetamine -dextroamphetamine  (ADDERALL XR) 10 MG 24 hr capsule, Take  1 capsule (10 mg total) by mouth 2 (two) times daily with breakfast and lunch., Disp: 60 capsule, Rfl: 0   [START ON 10/12/2024] amphetamine -dextroamphetamine  (ADDERALL XR) 10 MG 24 hr capsule, Take 1 capsule (10 mg total) by mouth 2 (two) times daily with breakfast and lunch., Disp: 60 capsule, Rfl: 0   [START ON 09/12/2024] amphetamine -dextroamphetamine  (ADDERALL XR) 10 MG 24 hr capsule, Take 1 capsule (10 mg total) by mouth 2 (two) times daily with breakfast and  lunch., Disp: 60 capsule, Rfl: 0   ARIPiprazole  (ABILIFY ) 15 MG tablet, Take 1 tablet (15 mg total) by mouth daily., Disp: 90 tablet, Rfl: 1   buPROPion  (WELLBUTRIN  XL) 300 MG 24 hr tablet, Take 1 tablet (300 mg total) by mouth daily., Disp: 90 tablet, Rfl: 1   Omega-3 Fatty Acids (FISH OIL PO), Take by mouth., Disp: , Rfl:    venlafaxine  XR (EFFEXOR -XR) 150 MG 24 hr capsule, Take 2 capsules (300 mg total) by mouth daily., Disp: 180 capsule, Rfl: 1  Current Facility-Administered Medications:    0.9 %  sodium chloride  infusion, 500 mL, Intravenous, Once, Abran Norleen SAILOR, MD Medication Side Effects: none  Family Medical/ Social History: Changes? no  MENTAL HEALTH EXAM:  There were no vitals taken for this visit.There is no height or weight on file to calculate BMI.  General Appearance: Casual, Well Groomed and Obese  Eye Contact:  Good  Speech:  Clear and Coherent and Normal Rate  Volume:  Normal  Mood:  Euthymic  Affect:  Congruent  Thought Process:  Goal Directed and Descriptions of Associations: Circumstantial  Orientation:  Full (Time, Place, and Person)  Thought Content: Logical   Suicidal Thoughts:  No  Homicidal Thoughts:  No  Memory:  WNL  Judgement:  Good  Insight:  Good  Psychomotor Activity:  Normal  Concentration:  Concentration: Good and Attention Span: Good  Recall:  Good  Fund of Knowledge: Good  Language: Good  Assets:  Communication Skills Desire for Improvement Financial Resources/Insurance Housing Leisure Time Resilience Social Support Talents/Skills Transportation  ADL's:  Intact  Cognition: WNL  Prognosis:  Good   DIAGNOSES:    ICD-10-CM   1. Mixed obsessional thoughts and acts  F42.2     2. Attention deficit hyperactivity disorder (ADHD), combined type  F90.2 amphetamine -dextroamphetamine  (ADDERALL XR) 10 MG 24 hr capsule    3. Bipolar I disorder (HCC)  F31.9     4. Generalized anxiety disorder  F41.1      Receiving Psychotherapy: Yes  Darryle Boos   RECOMMENDATIONS:  PDMP was reviewed.  Adderall filled 08/15/2024.  Gabapentin  filled 08/18/2024. I provided approximately 20 minutes of face to face time during this encounter, including time spent before and after the visit in records review, medical decision making, counseling pertinent to today's visit, and charting.   Discussed the anxiety. Restart Xanax  but take sparingly.   Restart Xanax  1 mg, 1/2-1 bid prn.  Continue Adderall XR 10 mg, 1 p.o. at breakfast and 1 p.o. at lunch. Continue Abilify  15 mg po q am. Continue Wellbutrin  XL 300 mg, 1 p.o. daily. Continue gabapentin  100 mg, 1-3 daily as directed. Continue Effexor  XR 150 mg, 2 p.o. every morning. Continue therapy. Return in 6 months.    Verneita Cooks, PA-C

## 2024-08-28 ENCOUNTER — Other Ambulatory Visit: Payer: Self-pay | Admitting: Physician Assistant

## 2024-09-20 ENCOUNTER — Encounter: Payer: Self-pay | Admitting: Radiology
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
## 2024-10-01 ENCOUNTER — Ambulatory Visit

## 2024-10-01 ENCOUNTER — Encounter: Payer: Self-pay | Admitting: Cardiology

## 2024-10-01 ENCOUNTER — Ambulatory Visit: Attending: Cardiology | Admitting: Cardiology

## 2024-10-01 ENCOUNTER — Ambulatory Visit
Admission: RE | Admit: 2024-10-01 | Discharge: 2024-10-01 | Disposition: A | Discharge status: Home / Self Care | Source: Ambulatory Visit | Attending: Physician Assistant | Admitting: Physician Assistant

## 2024-10-01 VITALS — BP 115/62 | HR 69 | Resp 17 | Ht 66.0 in | Wt 227.0 lb

## 2024-10-01 DIAGNOSIS — E785 Hyperlipidemia, unspecified: Secondary | ICD-10-CM | POA: Insufficient documentation

## 2024-10-01 DIAGNOSIS — I10 Essential (primary) hypertension: Secondary | ICD-10-CM | POA: Diagnosis present

## 2024-10-01 DIAGNOSIS — Z1231 Encounter for screening mammogram for malignant neoplasm of breast: Secondary | ICD-10-CM

## 2024-10-01 DIAGNOSIS — I2510 Atherosclerotic heart disease of native coronary artery without angina pectoris: Secondary | ICD-10-CM | POA: Diagnosis present

## 2024-10-01 LAB — CBC

## 2024-10-01 NOTE — Patient Instructions (Signed)
 Medication Instructions:  Your physician recommends that you continue on your current medications as directed. Please refer to the Current Medication list given to you today.  *If you need a refill on your cardiac medications before your next appointment, please call your pharmacy*  Lab Work: Today- CMET, CBC, Lipids, LP(a)  If you have labs (blood work) drawn today and your tests are completely normal, you will receive your results only by: MyChart Message (if you have MyChart) OR A paper copy in the mail If you have any lab test that is abnormal or we need to change your treatment, we will call you to review the results.   Follow-Up: At [facility], you and your health needs are our priority.  As part of our continuing mission to provide you with exceptional heart care, our providers are all part of one team.  This team includes your primary Cardiologist (physician) and Advanced Practice Providers or APPs (Physician Assistants and Nurse Practitioners) who all work together to provide you with the care you need, when you need it.  Your next appointment:   12 month(s)  Provider:   Redell Shallow, MD

## 2024-10-05 ENCOUNTER — Ambulatory Visit: Payer: Self-pay | Admitting: Cardiology

## 2024-10-05 LAB — COMPREHENSIVE METABOLIC PANEL WITH GFR
ALT: 15 IU/L (ref 0–32)
AST: 18 IU/L (ref 0–40)
Albumin: 4.1 g/dL (ref 3.9–4.9)
Alkaline Phosphatase: 86 IU/L (ref 41–116)
BUN/Creatinine Ratio: 5 — ABNORMAL LOW (ref 9–23)
BUN: 5 mg/dL — ABNORMAL LOW (ref 6–24)
Bilirubin Total: 0.3 mg/dL (ref 0.0–1.2)
CO2: 23 mmol/L (ref 20–29)
Calcium: 9.4 mg/dL (ref 8.7–10.2)
Chloride: 102 mmol/L (ref 96–106)
Creatinine, Ser: 1.03 mg/dL — ABNORMAL HIGH (ref 0.57–1.00)
Globulin, Total: 2.7 g/dL (ref 1.5–4.5)
Glucose: 93 mg/dL (ref 70–99)
Potassium: 4 mmol/L (ref 3.5–5.2)
Sodium: 139 mmol/L (ref 134–144)
Total Protein: 6.8 g/dL (ref 6.0–8.5)
eGFR: 67 mL/min/1.73 (ref >59)

## 2024-10-05 LAB — LIPID PANEL
Chol/HDL Ratio: 2.6 ratio (ref 0.0–4.4)
Cholesterol, Total: 102 mg/dL (ref 100–199)
HDL: 39 mg/dL — ABNORMAL LOW (ref >39)
LDL Chol Calc (NIH): 37 mg/dL (ref 0–99)
Triglycerides: 156 mg/dL — ABNORMAL HIGH (ref 0–149)
VLDL Cholesterol Cal: 26 mg/dL (ref 5–40)

## 2024-10-05 LAB — CBC
Hematocrit: 41 % (ref 34.0–46.6)
Hemoglobin: 13.1 g/dL (ref 11.1–15.9)
MCH: 28.9 pg (ref 26.6–33.0)
MCHC: 32 g/dL (ref 31.5–35.7)
MCV: 90 fL (ref 79–97)
Platelets: 370 x10E3/uL (ref 150–450)
RBC: 4.54 x10E6/uL (ref 3.77–5.28)
RDW: 13 % (ref 11.7–15.4)
WBC: 9 x10E3/uL (ref 3.4–10.8)

## 2024-10-05 LAB — LIPOPROTEIN A (LPA): Lipoprotein (a): 35.9 nmol/L (ref <75.0)

## 2024-10-20 ENCOUNTER — Encounter: Admitting: Physician Assistant

## 2024-11-03 ENCOUNTER — Other Ambulatory Visit: Payer: Self-pay | Admitting: Cardiology

## 2024-11-03 ENCOUNTER — Other Ambulatory Visit: Payer: Self-pay | Admitting: Physician Assistant

## 2024-11-23 ENCOUNTER — Telehealth: Payer: Self-pay | Admitting: Physician Assistant

## 2024-11-23 NOTE — Telephone Encounter (Signed)
 Patient lvm stating the [facility] uses doesn't have the Adderall 10 mg. She is requesting a new Rx for 15 mg that is currently in stock. Pls advise.

## 2024-11-24 ENCOUNTER — Other Ambulatory Visit: Payer: Self-pay

## 2024-11-25 ENCOUNTER — Other Ambulatory Visit: Payer: Self-pay

## 2024-11-25 DIAGNOSIS — F902 Attention-deficit hyperactivity disorder, combined type: Secondary | ICD-10-CM

## 2024-11-25 MED ORDER — AMPHETAMINE-DEXTROAMPHET ER 15 MG PO CP24: 15.0000 mg | ORAL_CAPSULE | ORAL | 0 refills | Status: DC

## 2024-11-25 NOTE — Telephone Encounter (Signed)
 Pended 1 RF for XR 15 dosing due to unavailability of 10 XR dosing.

## 2024-11-29 ENCOUNTER — Other Ambulatory Visit: Payer: Self-pay

## 2024-11-29 DIAGNOSIS — F902 Attention-deficit hyperactivity disorder, combined type: Secondary | ICD-10-CM

## 2024-11-29 MED ORDER — AMPHETAMINE-DEXTROAMPHET ER 15 MG PO CP24: 15.0000 mg | ORAL_CAPSULE | Freq: Two times a day (BID) | ORAL | 0 refills | Status: AC

## 2024-12-01 ENCOUNTER — Other Ambulatory Visit: Payer: Self-pay | Admitting: Physician Assistant

## 2025-02-25 ENCOUNTER — Ambulatory Visit: Admitting: Physician Assistant
